# Patient Record
Sex: Male | Born: 1950
Health system: Southern US, Community
[De-identification: ages and names within clinical notes are randomized; demographics above are authoritative.]

## PROBLEM LIST (undated history)

## (undated) DIAGNOSIS — E785 Hyperlipidemia, unspecified: Secondary | ICD-10-CM

## (undated) DIAGNOSIS — C801 Malignant (primary) neoplasm, unspecified: Secondary | ICD-10-CM

## (undated) DIAGNOSIS — M542 Cervicalgia: Secondary | ICD-10-CM

## (undated) DIAGNOSIS — Z8679 Personal history of other diseases of the circulatory system: Secondary | ICD-10-CM

## (undated) DIAGNOSIS — F32A Depression, unspecified: Secondary | ICD-10-CM

## (undated) DIAGNOSIS — N529 Male erectile dysfunction, unspecified: Secondary | ICD-10-CM

## (undated) DIAGNOSIS — R7303 Prediabetes: Secondary | ICD-10-CM

## (undated) DIAGNOSIS — G8929 Other chronic pain: Secondary | ICD-10-CM

## (undated) DIAGNOSIS — M5136 Other intervertebral disc degeneration, lumbar region: Secondary | ICD-10-CM

## (undated) DIAGNOSIS — R519 Headache, unspecified: Secondary | ICD-10-CM

## (undated) DIAGNOSIS — I10 Essential (primary) hypertension: Secondary | ICD-10-CM

## (undated) DIAGNOSIS — E291 Testicular hypofunction: Secondary | ICD-10-CM

## (undated) DIAGNOSIS — G4733 Obstructive sleep apnea (adult) (pediatric): Secondary | ICD-10-CM

## (undated) DIAGNOSIS — T8859XA Other complications of anesthesia, initial encounter: Secondary | ICD-10-CM

## (undated) DIAGNOSIS — M51369 Other intervertebral disc degeneration, lumbar region without mention of lumbar back pain or lower extremity pain: Secondary | ICD-10-CM

## (undated) DIAGNOSIS — T7840XA Allergy, unspecified, initial encounter: Secondary | ICD-10-CM

## (undated) DIAGNOSIS — R011 Cardiac murmur, unspecified: Secondary | ICD-10-CM

## (undated) DIAGNOSIS — R51 Headache: Secondary | ICD-10-CM

## (undated) DIAGNOSIS — E6609 Other obesity due to excess calories: Secondary | ICD-10-CM

## (undated) DIAGNOSIS — K589 Irritable bowel syndrome without diarrhea: Secondary | ICD-10-CM

## (undated) DIAGNOSIS — I429 Cardiomyopathy, unspecified: Secondary | ICD-10-CM

## (undated) DIAGNOSIS — M79671 Pain in right foot: Secondary | ICD-10-CM

## (undated) DIAGNOSIS — G4730 Sleep apnea, unspecified: Secondary | ICD-10-CM

## (undated) DIAGNOSIS — Z8781 Personal history of (healed) traumatic fracture: Secondary | ICD-10-CM

## (undated) DIAGNOSIS — K219 Gastro-esophageal reflux disease without esophagitis: Secondary | ICD-10-CM

## (undated) DIAGNOSIS — F329 Major depressive disorder, single episode, unspecified: Secondary | ICD-10-CM

## (undated) HISTORY — PX: CARPAL TUNNEL RELEASE: SHX101

## (undated) HISTORY — DX: Personal history of other diseases of the circulatory system: Z86.79

## (undated) HISTORY — DX: Irritable bowel syndrome, unspecified: K58.9

## (undated) HISTORY — DX: Testicular hypofunction: E29.1

## (undated) HISTORY — DX: Depression, unspecified: F32.A

## (undated) HISTORY — DX: Hyperlipidemia, unspecified: E78.5

## (undated) HISTORY — PX: HAMMER TOE SURGERY: SHX385

## (undated) HISTORY — DX: Malignant (primary) neoplasm, unspecified: C80.1

## (undated) HISTORY — DX: Other chronic pain: G89.29

## (undated) HISTORY — PX: SHOULDER ARTHROSCOPY: SHX128

## (undated) HISTORY — PX: TENDON REPAIR: SHX5111

## (undated) HISTORY — PX: WISDOM TOOTH EXTRACTION: SHX21

## (undated) HISTORY — DX: Male erectile dysfunction, unspecified: N52.9

## (undated) HISTORY — DX: Cardiomyopathy, unspecified: I42.9

## (undated) HISTORY — DX: Allergy, unspecified, initial encounter: T78.40XA

## (undated) HISTORY — DX: Other intervertebral disc degeneration, lumbar region without mention of lumbar back pain or lower extremity pain: M51.369

## (undated) HISTORY — DX: Major depressive disorder, single episode, unspecified: F32.9

## (undated) HISTORY — PX: COLONOSCOPY: SHX174

## (undated) HISTORY — DX: Other obesity due to excess calories: E66.09

## (undated) HISTORY — PX: OTHER SURGICAL HISTORY: SHX169

## (undated) HISTORY — PX: PLANTAR FASCIA RELEASE: SHX2239

## (undated) HISTORY — DX: Headache, unspecified: R51.9

## (undated) HISTORY — DX: Obstructive sleep apnea (adult) (pediatric): G47.33

## (undated) HISTORY — DX: Gastro-esophageal reflux disease without esophagitis: K21.9

## (undated) HISTORY — DX: Cervicalgia: M54.2

## (undated) HISTORY — DX: Personal history of (healed) traumatic fracture: Z87.81

## (undated) HISTORY — PX: ORIF WRIST FRACTURE: SHX2133

## (undated) HISTORY — DX: Other intervertebral disc degeneration, lumbar region: M51.36

## (undated) HISTORY — DX: Headache: R51

## (undated) HISTORY — PX: POLYPECTOMY: SHX149

## (undated) HISTORY — DX: Pain in right foot: M79.671

## (undated) HISTORY — PX: MENISCUS REPAIR: SHX5179

## (undated) HISTORY — DX: Sleep apnea, unspecified: G47.30

---

## 1986-08-19 DIAGNOSIS — Z8781 Personal history of (healed) traumatic fracture: Secondary | ICD-10-CM

## 1986-08-19 HISTORY — DX: Personal history of (healed) traumatic fracture: Z87.81

## 1994-08-19 DIAGNOSIS — Z8679 Personal history of other diseases of the circulatory system: Secondary | ICD-10-CM

## 1994-08-19 HISTORY — DX: Personal history of other diseases of the circulatory system: Z86.79

## 1998-08-19 HISTORY — PX: CARDIAC CATHETERIZATION: SHX172

## 2015-06-21 HISTORY — PX: TRANSTHORACIC ECHOCARDIOGRAM: SHX275

## 2015-06-30 ENCOUNTER — Emergency Department (HOSPITAL_COMMUNITY)
Admission: EM | Admit: 2015-06-30 | Discharge: 2015-06-30 | Disposition: A | Discharge status: Home / Self Care | Attending: Emergency Medicine | Admitting: Emergency Medicine

## 2015-06-30 ENCOUNTER — Encounter (HOSPITAL_COMMUNITY): Payer: Self-pay | Admitting: Family Medicine

## 2015-06-30 ENCOUNTER — Emergency Department (HOSPITAL_COMMUNITY)

## 2015-06-30 DIAGNOSIS — Y9389 Activity, other specified: Secondary | ICD-10-CM | POA: Diagnosis not present

## 2015-06-30 DIAGNOSIS — W109XXA Fall (on) (from) unspecified stairs and steps, initial encounter: Secondary | ICD-10-CM | POA: Diagnosis not present

## 2015-06-30 DIAGNOSIS — Z79899 Other long term (current) drug therapy: Secondary | ICD-10-CM | POA: Diagnosis not present

## 2015-06-30 DIAGNOSIS — S8991XA Unspecified injury of right lower leg, initial encounter: Secondary | ICD-10-CM | POA: Diagnosis present

## 2015-06-30 DIAGNOSIS — M79606 Pain in leg, unspecified: Secondary | ICD-10-CM

## 2015-06-30 DIAGNOSIS — Y998 Other external cause status: Secondary | ICD-10-CM | POA: Insufficient documentation

## 2015-06-30 DIAGNOSIS — Z7982 Long term (current) use of aspirin: Secondary | ICD-10-CM | POA: Diagnosis not present

## 2015-06-30 DIAGNOSIS — I10 Essential (primary) hypertension: Secondary | ICD-10-CM | POA: Insufficient documentation

## 2015-06-30 DIAGNOSIS — Y92009 Unspecified place in unspecified non-institutional (private) residence as the place of occurrence of the external cause: Secondary | ICD-10-CM | POA: Diagnosis not present

## 2015-06-30 DIAGNOSIS — S76111A Strain of right quadriceps muscle, fascia and tendon, initial encounter: Secondary | ICD-10-CM

## 2015-06-30 DIAGNOSIS — M79604 Pain in right leg: Secondary | ICD-10-CM

## 2015-06-30 DIAGNOSIS — S79911A Unspecified injury of right hip, initial encounter: Secondary | ICD-10-CM | POA: Insufficient documentation

## 2015-06-30 HISTORY — DX: Essential (primary) hypertension: I10

## 2015-06-30 IMAGING — CR DG HIP (WITH OR WITHOUT PELVIS) 2-3V*R*
3 series · 3 of 3 positions shown · non-contrast
Comparison: None.

CLINICAL DATA: Fell today while to mild showing a house. Pain in
the right hip.

EXAM:
DG HIP (WITH OR WITHOUT PELVIS) 2-3V RIGHT

[pelvis ap]
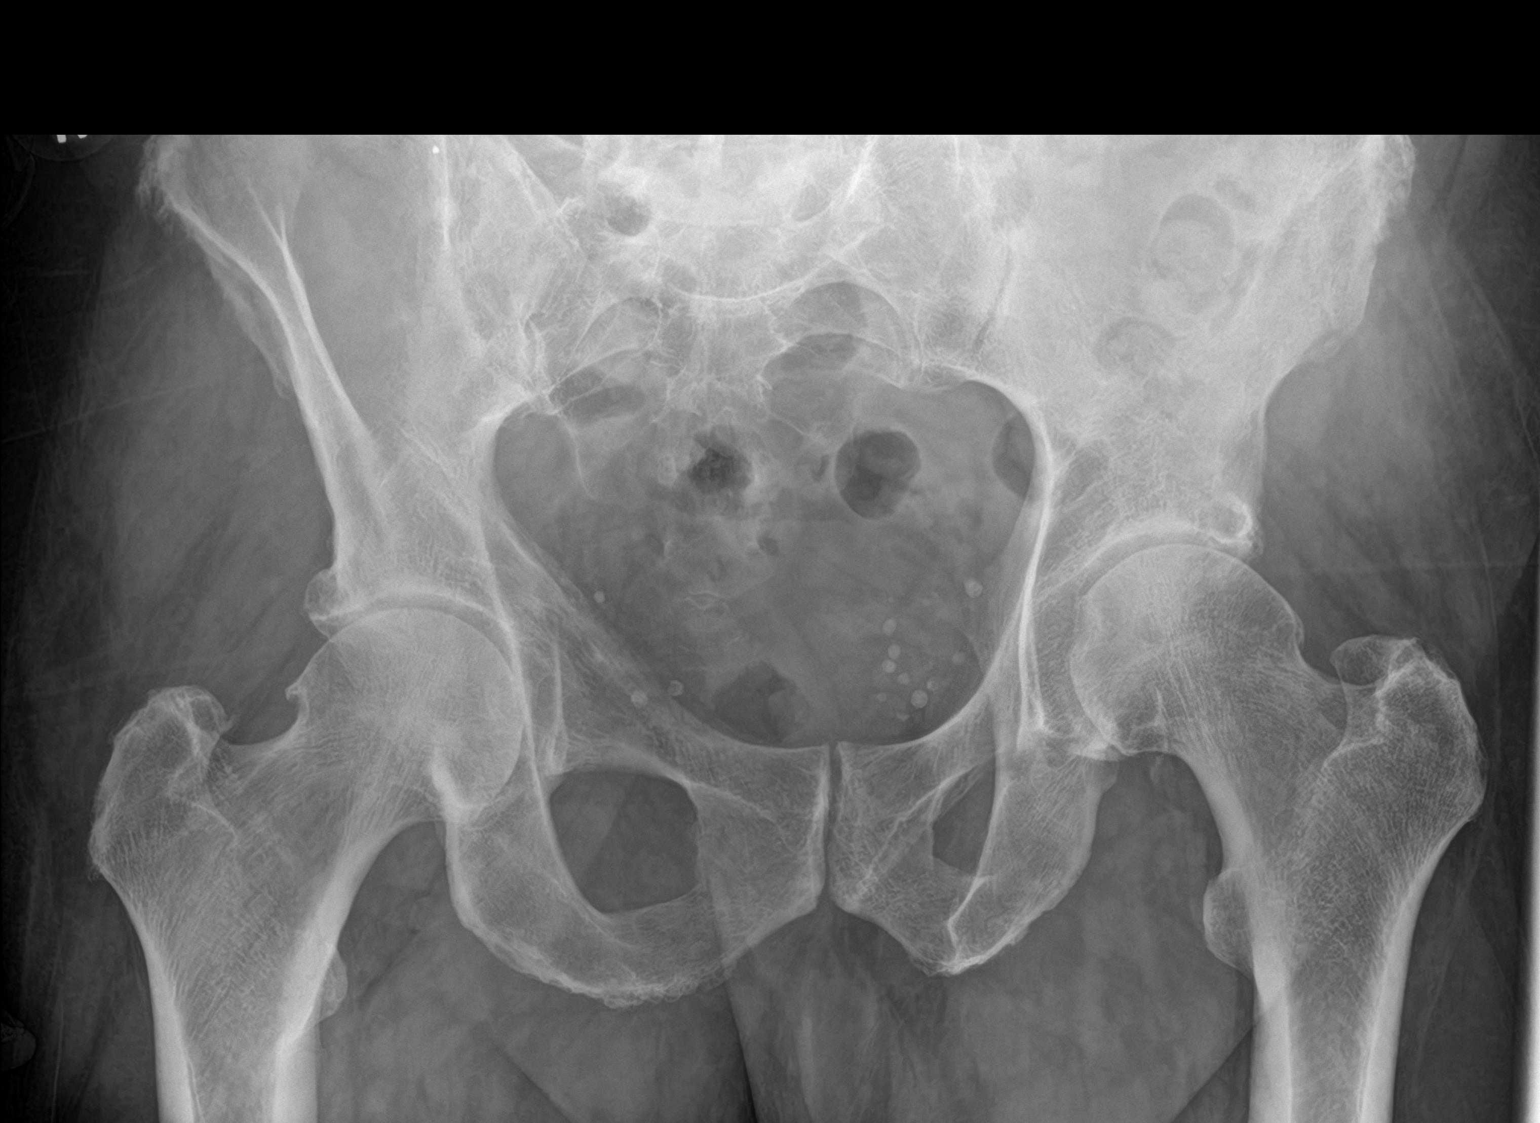

[hip ap]
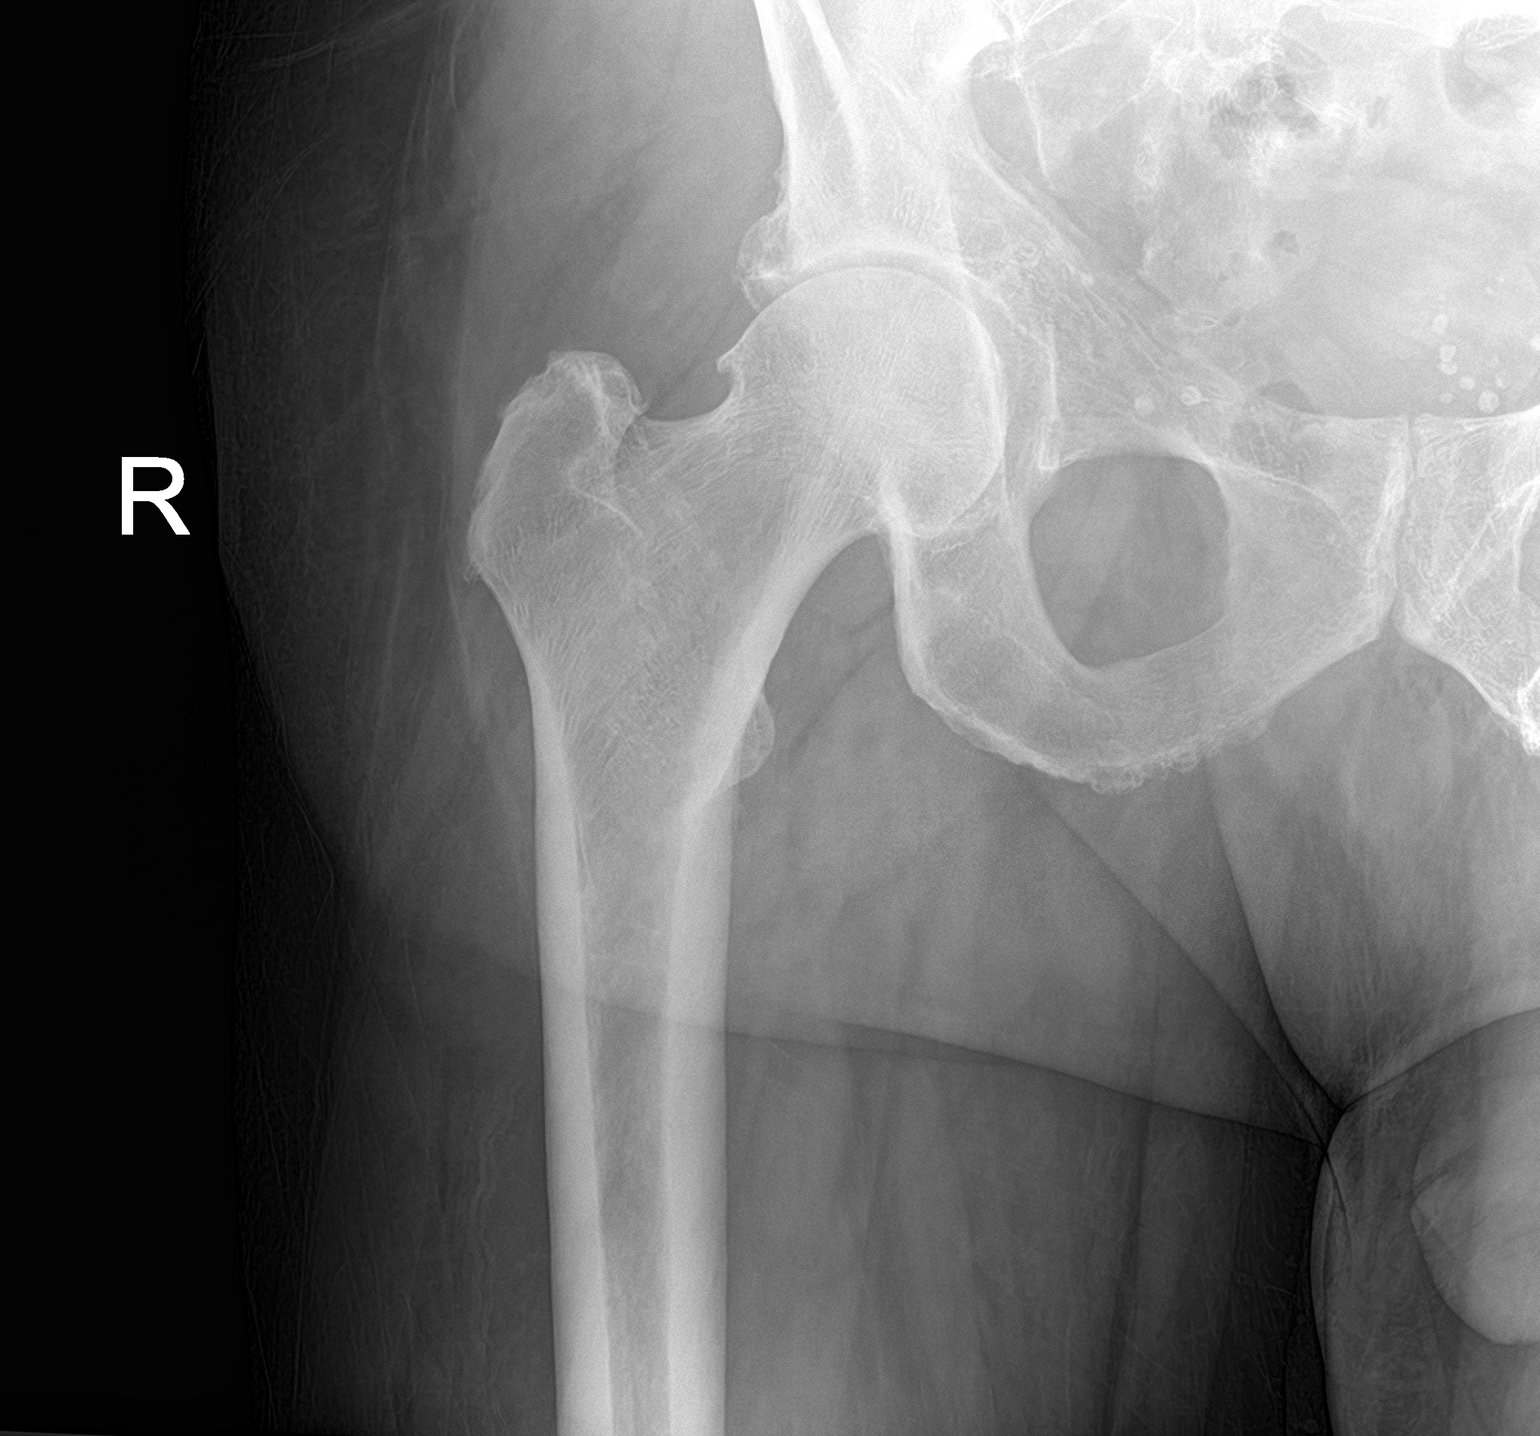

[hip lat]
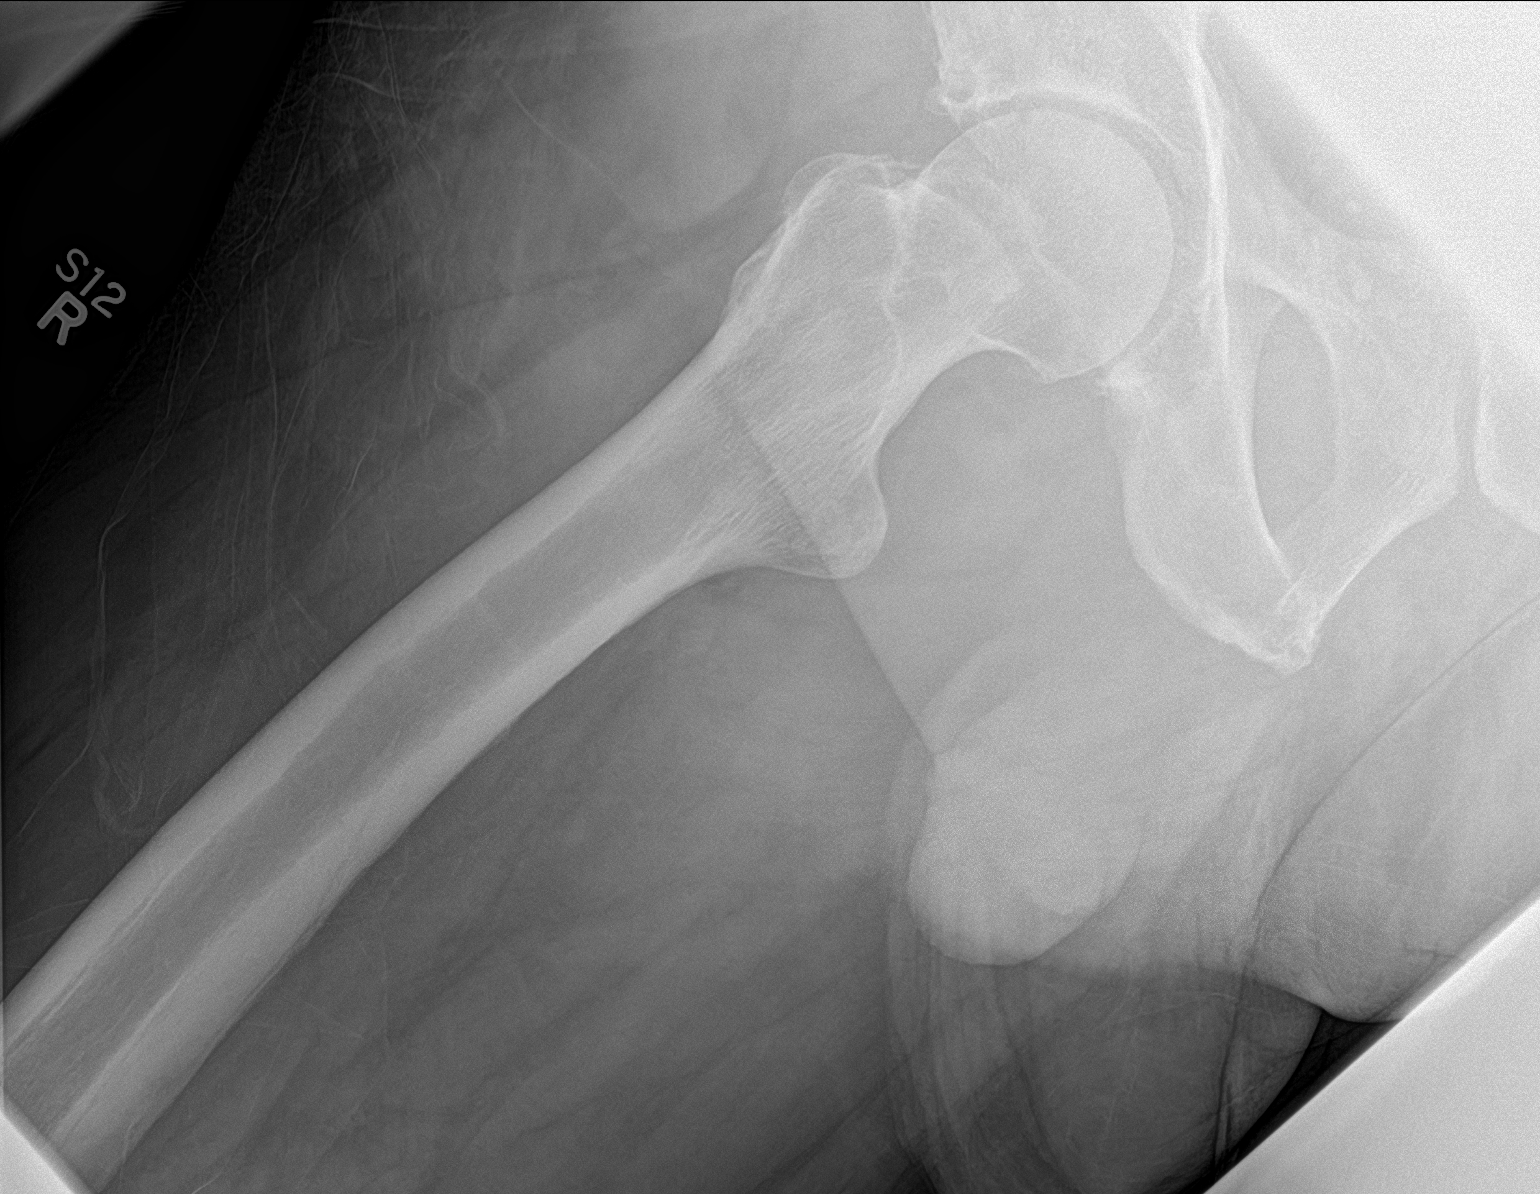

[3 of 3 positions shown; findings below may reference images not displayed]

FINDINGS: No evidence of fracture or dislocation. Mild to moderate
osteoarthritis of both hip joints.
IMPRESSION: No acute or traumatic finding.  Osteoarthritis of the hip joints.

## 2015-06-30 IMAGING — CR DG ANKLE COMPLETE 3+V*R*
3 series · 3 of 3 positions shown · non-contrast
Comparison: None.

CLINICAL DATA: Fell today and injured right knee, right femur and
right ankle

EXAM:
RIGHT KNEE - COMPLETE 4+ VIEW; RIGHT FEMUR 2 VIEWS; RIGHT ANKLE -
COMPLETE 3+ VIEW

[ankle ap]
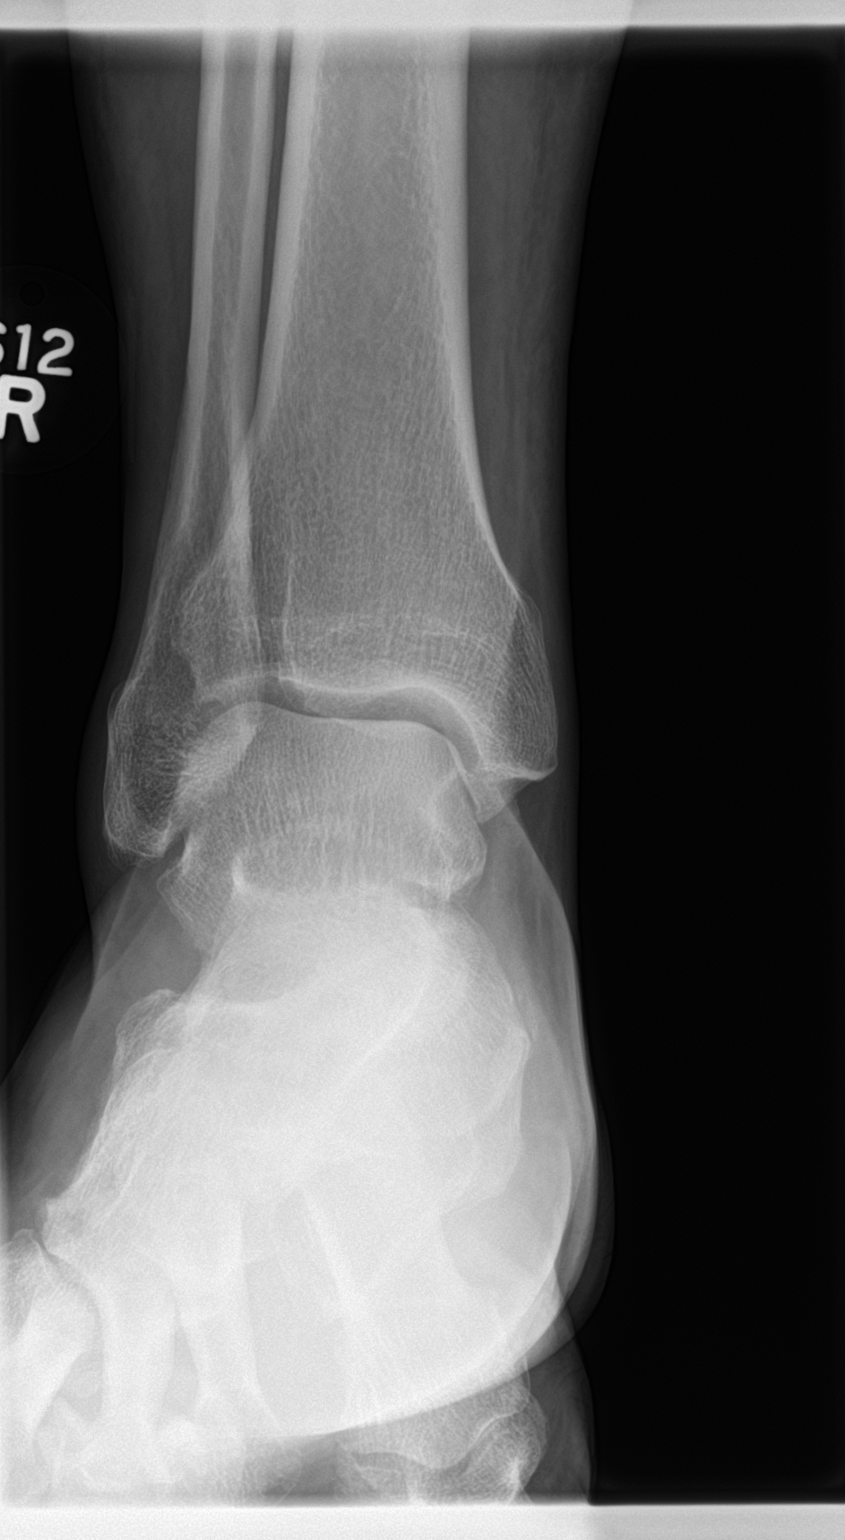

[ankle obl]
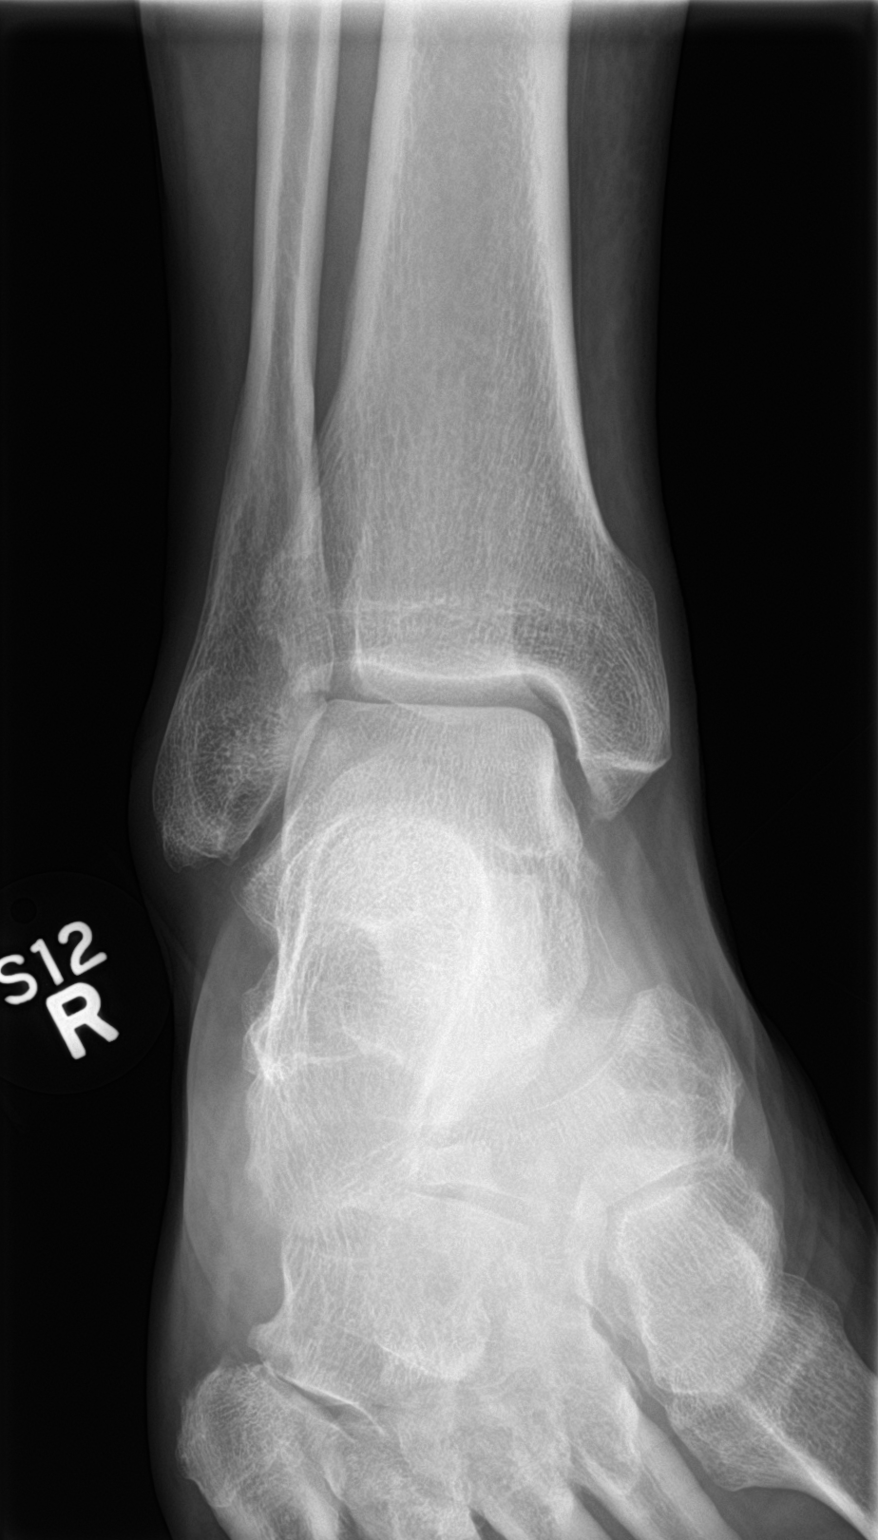

[ankle lat]
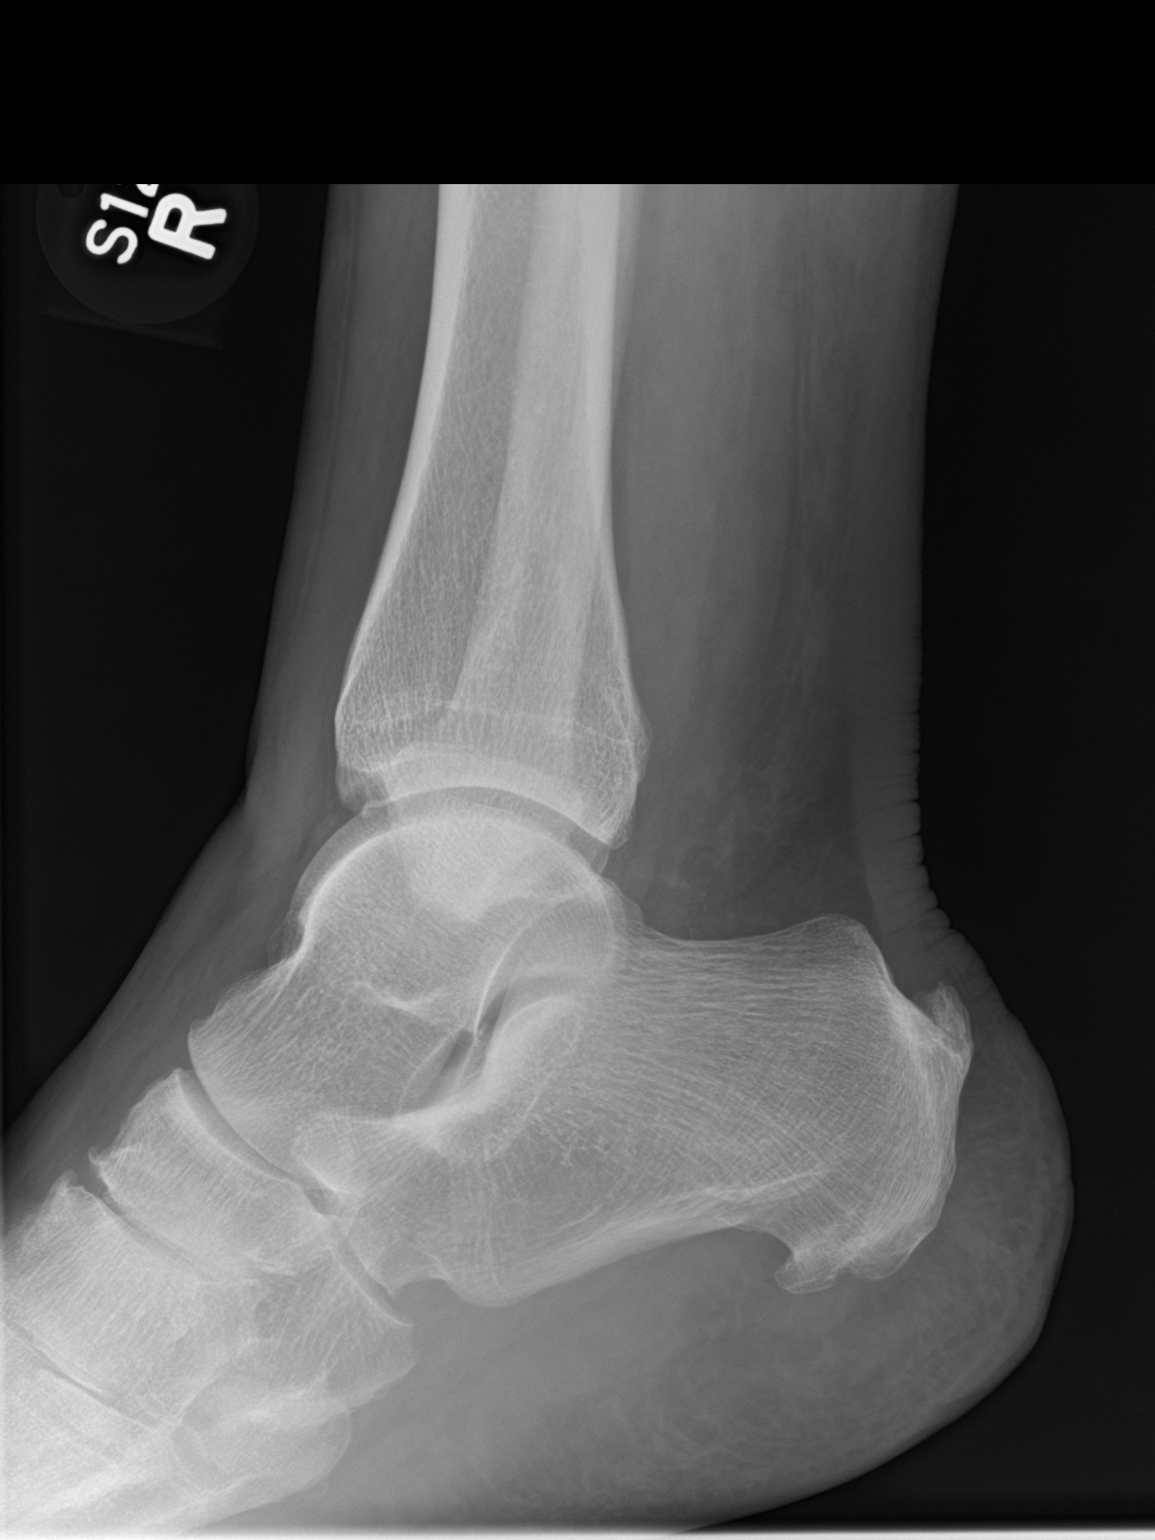

[3 of 3 positions shown; findings below may reference images not displayed]

FINDINGS: Right femur:

Mild hip joint degenerative changes but no acute fracture. The
visualized bony pelvis is intact. No femur fracture.

Right knee:

Moderate tricompartmental degenerative changes with joint space
narrowing and osteophytic spurring. No acute fracture or
osteochondral abnormality. Somewhat heterogeneous appearance of the
quadriceps tendon. Could not exclude a tendon injury.

Right ankle:

The ankle mortise is maintained. No acute ankle fracture. Mild
degenerative changes. No definite joint effusion. The mid and
hindfoot bony structures are intact. Calcaneal spurs are noted.
IMPRESSION: No acute hip or femur fracture.

Degenerative changes involving the knee but no acute fracture. Could
not exclude the possibility of a quadriceps tendon injury.

No acute ankle fracture.

## 2015-06-30 IMAGING — CR DG FEMUR 2+V*R*
4 series · 4 of 4 positions shown · non-contrast
Comparison: None.

CLINICAL DATA: Fell today and injured right knee, right femur and
right ankle

EXAM:
RIGHT KNEE - COMPLETE 4+ VIEW; RIGHT FEMUR 2 VIEWS; RIGHT ANKLE -
COMPLETE 3+ VIEW

[femur ap (1 of 2)]
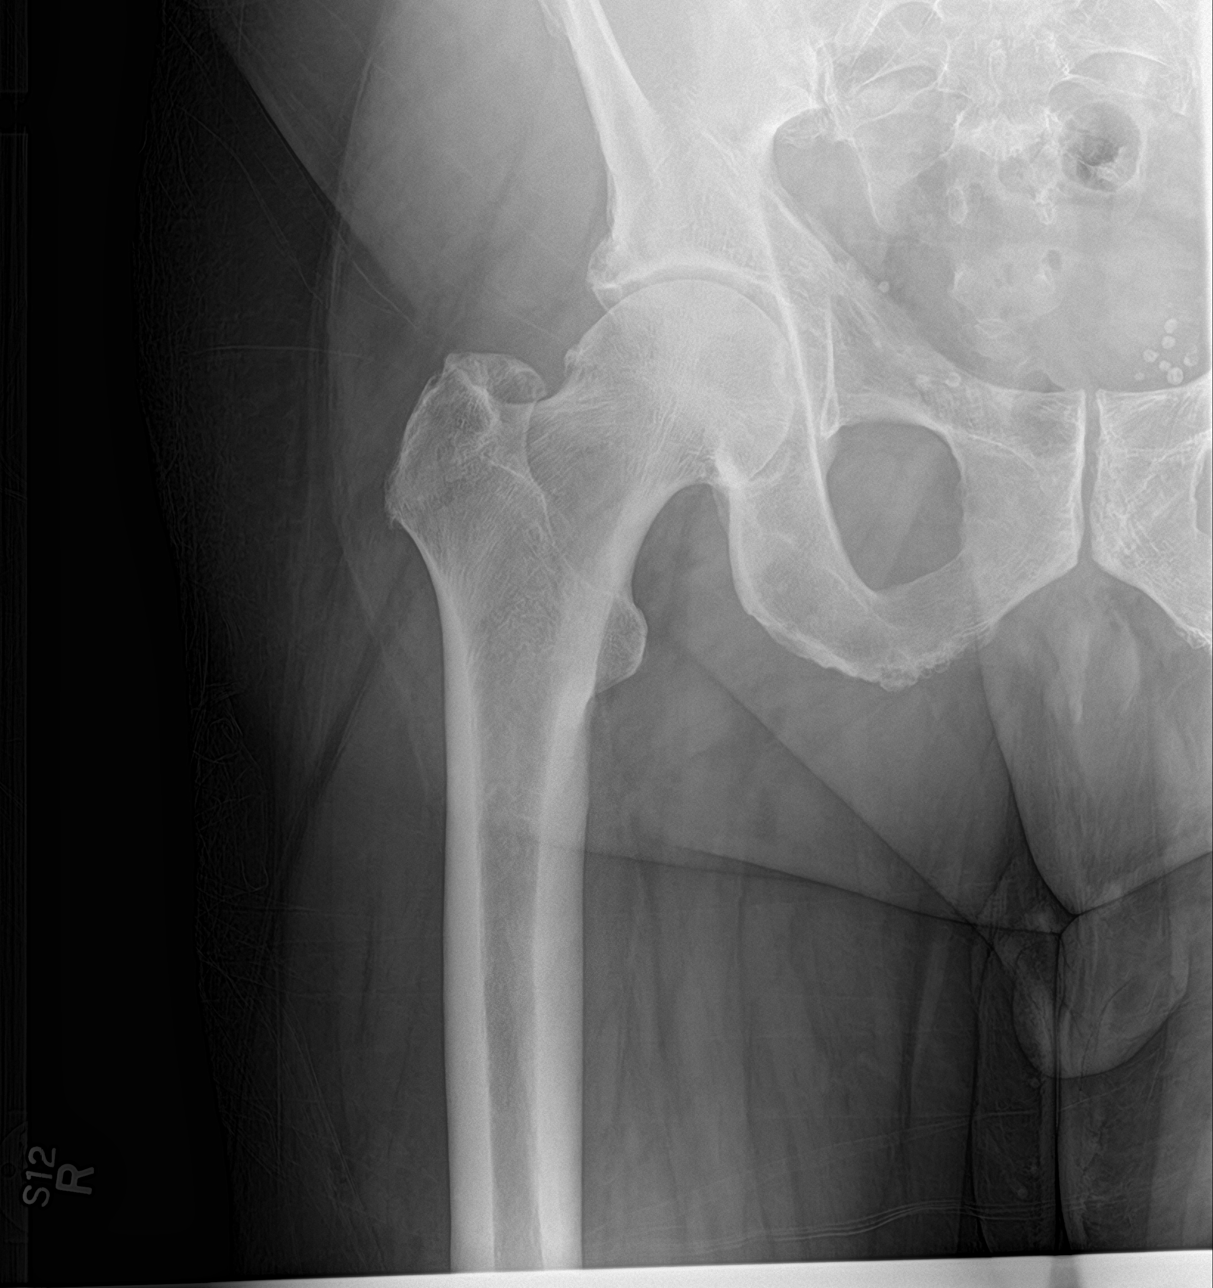

[femur ap (2 of 2)]
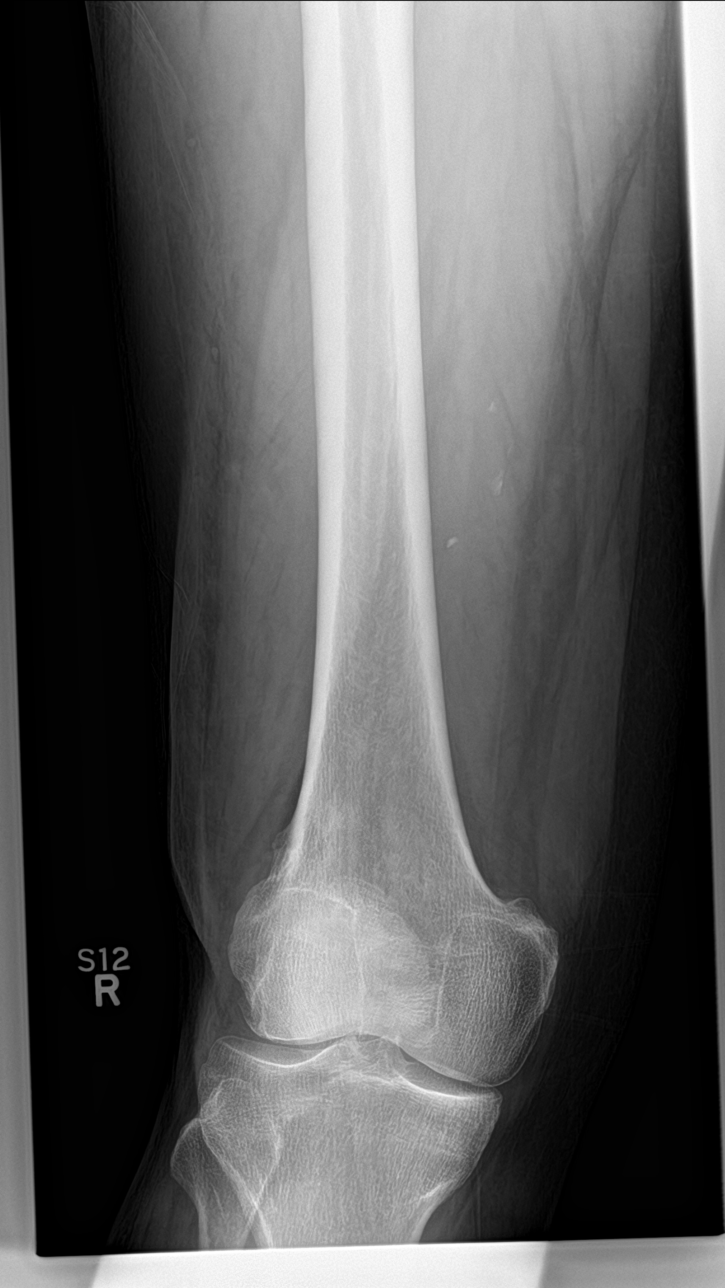

[femur lat (1 of 2)]
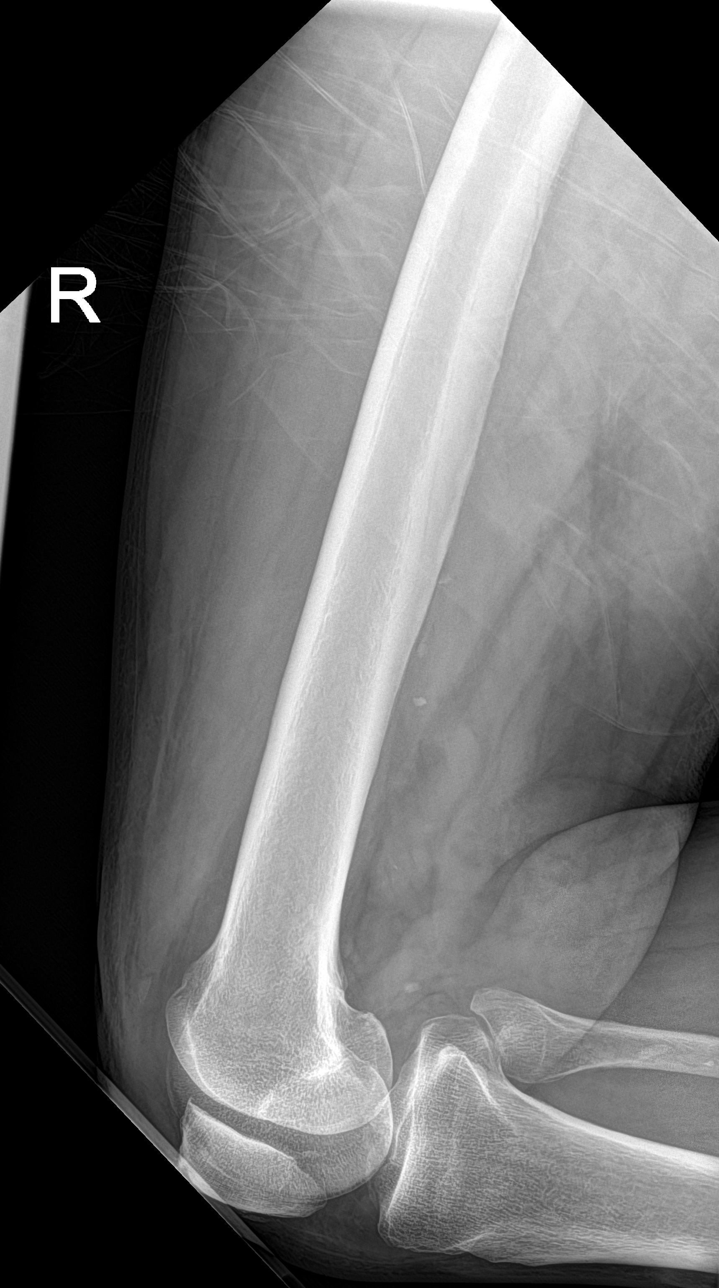

[femur lat (2 of 2)]
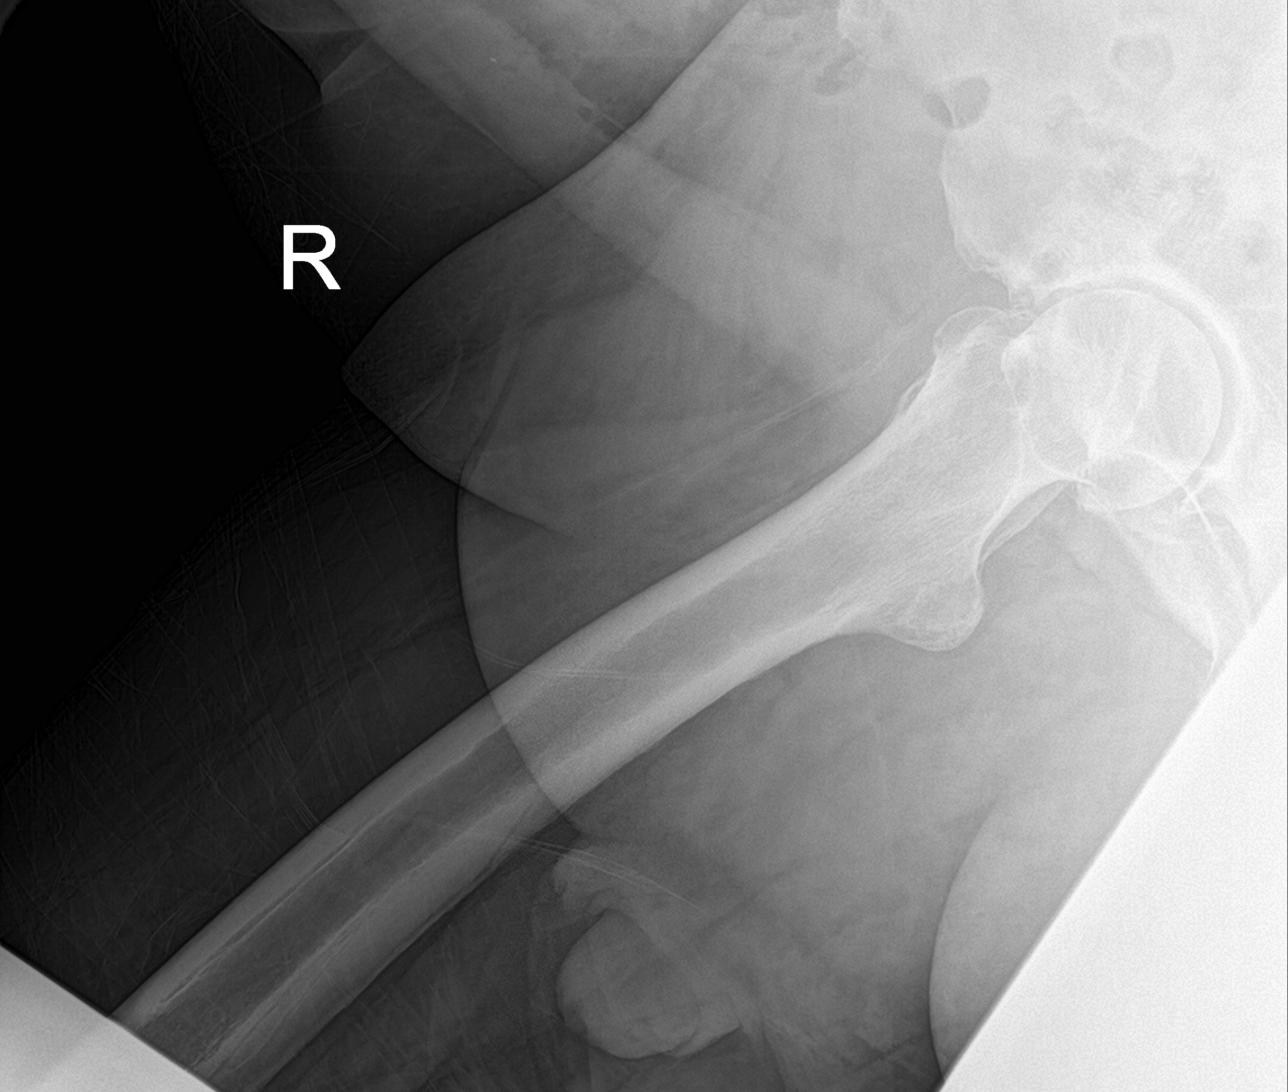

[4 of 4 positions shown; findings below may reference images not displayed]

FINDINGS: Right femur:

Mild hip joint degenerative changes but no acute fracture. The
visualized bony pelvis is intact. No femur fracture.

Right knee:

Moderate tricompartmental degenerative changes with joint space
narrowing and osteophytic spurring. No acute fracture or
osteochondral abnormality. Somewhat heterogeneous appearance of the
quadriceps tendon. Could not exclude a tendon injury.

Right ankle:

The ankle mortise is maintained. No acute ankle fracture. Mild
degenerative changes. No definite joint effusion. The mid and
hindfoot bony structures are intact. Calcaneal spurs are noted.
IMPRESSION: No acute hip or femur fracture.

Degenerative changes involving the knee but no acute fracture. Could
not exclude the possibility of a quadriceps tendon injury.

No acute ankle fracture.

## 2015-06-30 IMAGING — CR DG KNEE COMPLETE 4+V*R*
4 series · 4 of 4 positions shown · non-contrast
Comparison: None.

CLINICAL DATA: Fell today and injured right knee, right femur and
right ankle

EXAM:
RIGHT KNEE - COMPLETE 4+ VIEW; RIGHT FEMUR 2 VIEWS; RIGHT ANKLE -
COMPLETE 3+ VIEW

[tunnel]
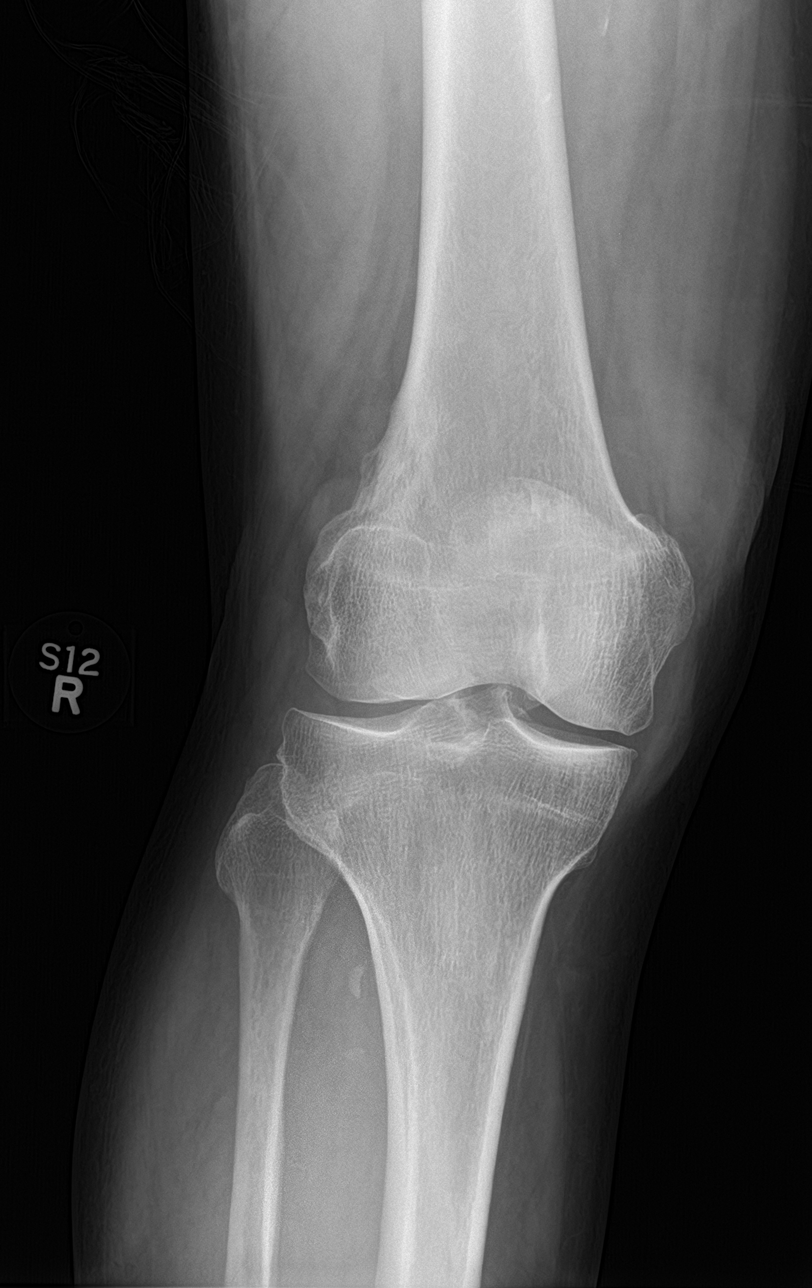

[knee lat]
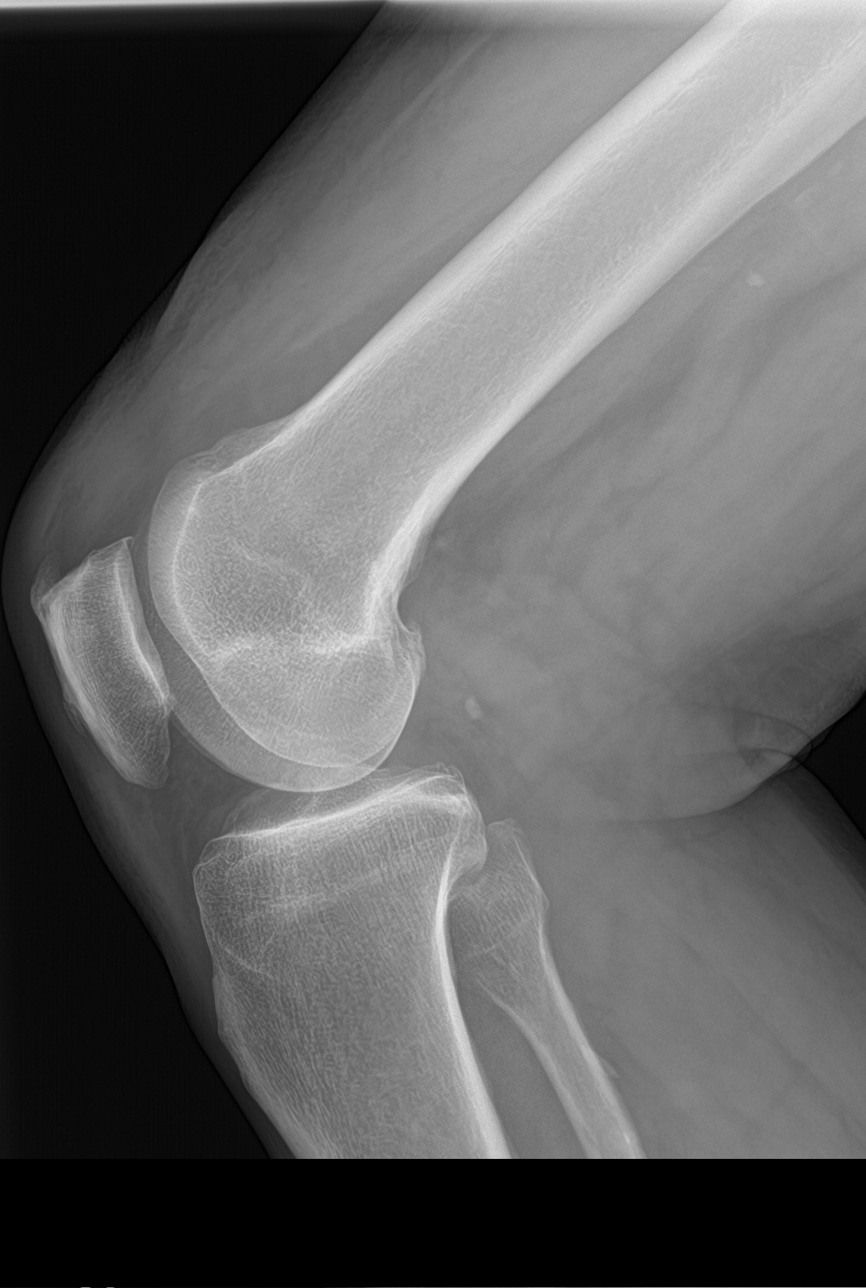

[knee obl (1 of 2)]
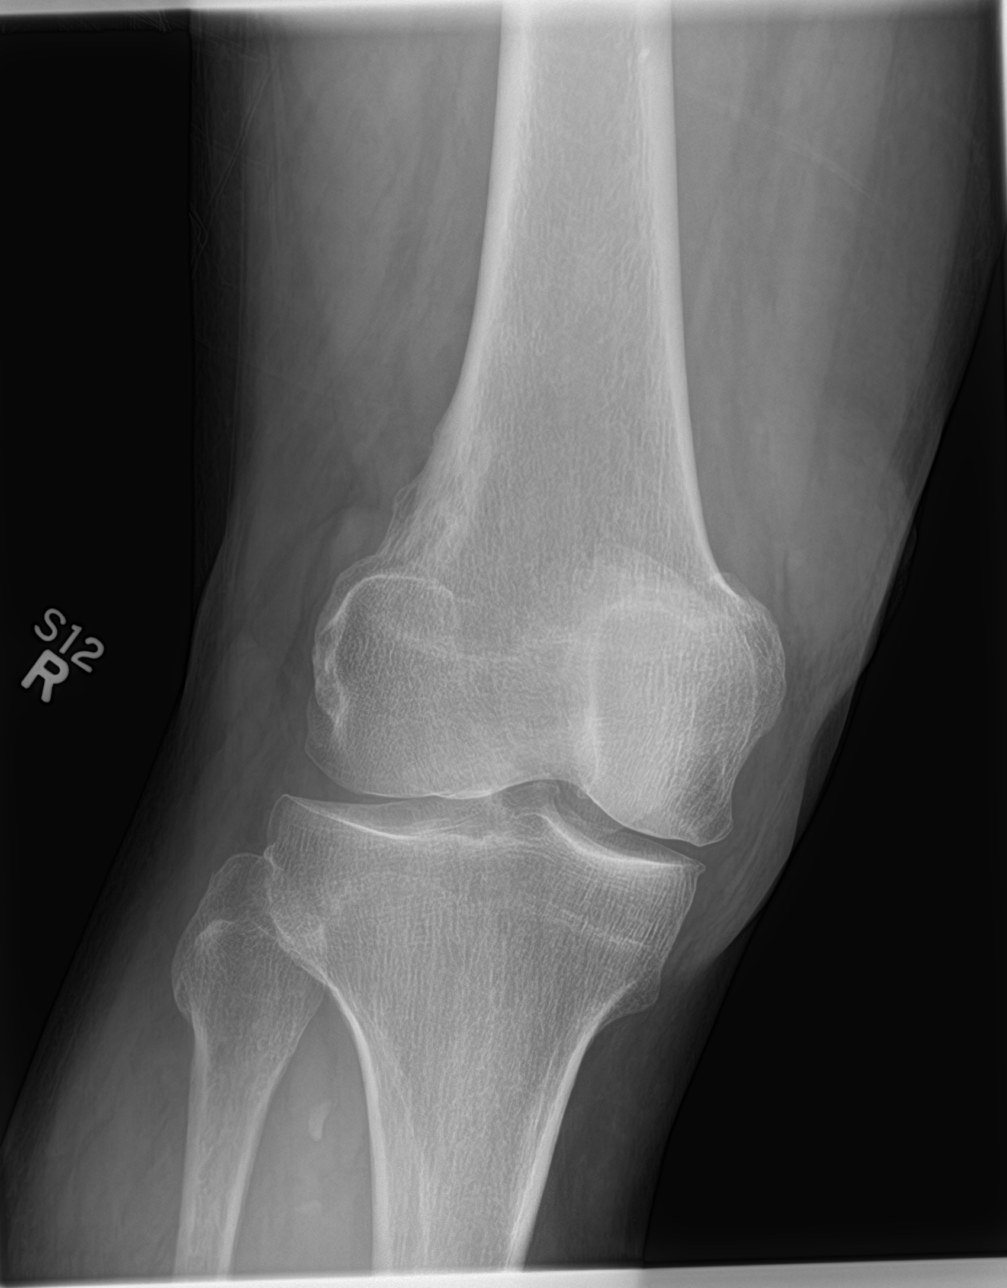

[knee obl (2 of 2)]
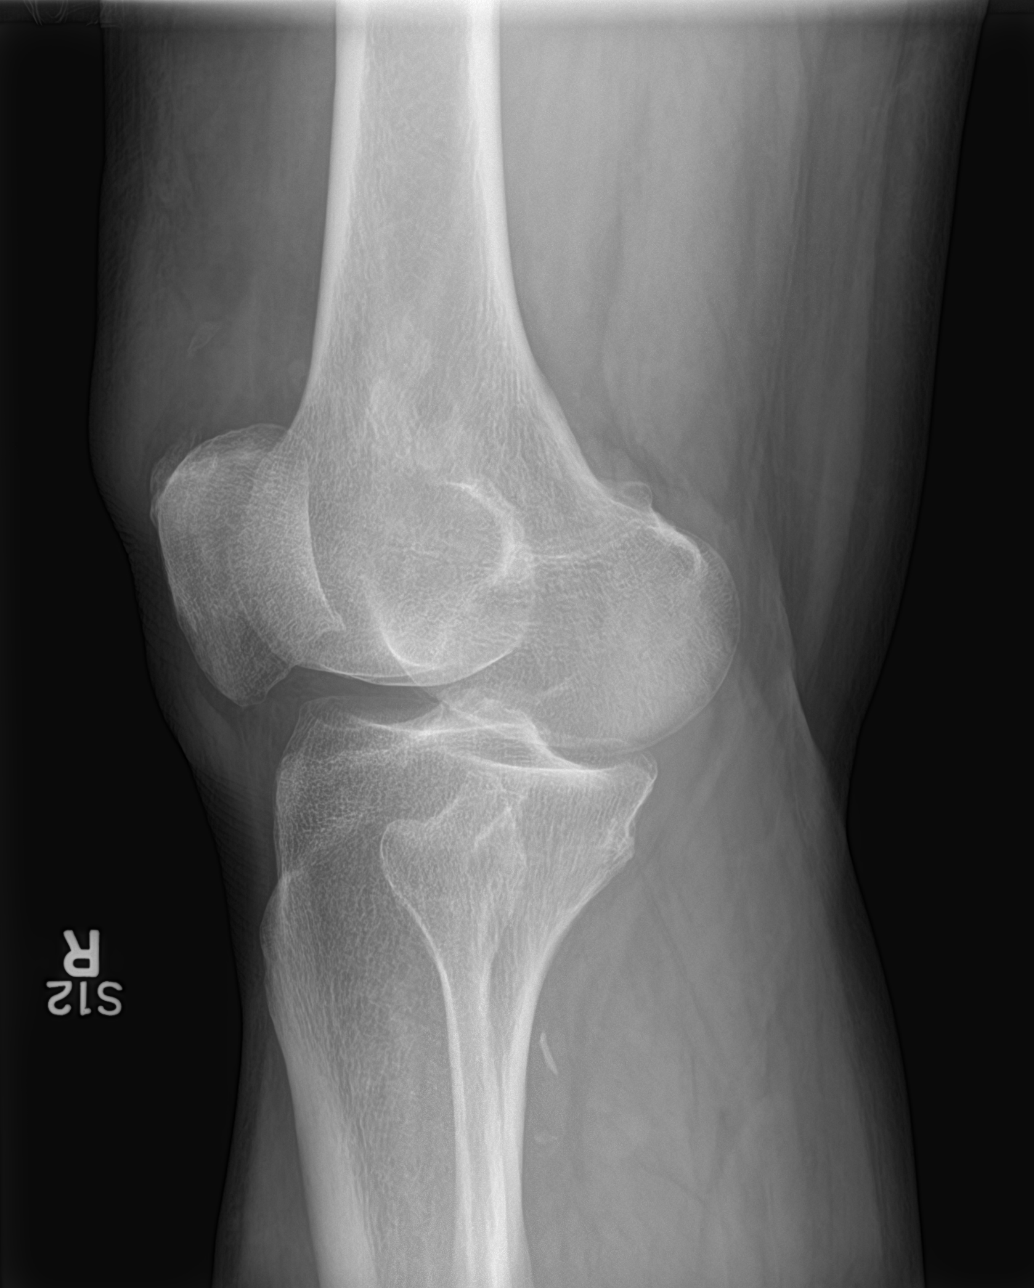

[4 of 4 positions shown; findings below may reference images not displayed]

FINDINGS: Right femur:

Mild hip joint degenerative changes but no acute fracture. The
visualized bony pelvis is intact. No femur fracture.

Right knee:

Moderate tricompartmental degenerative changes with joint space
narrowing and osteophytic spurring. No acute fracture or
osteochondral abnormality. Somewhat heterogeneous appearance of the
quadriceps tendon. Could not exclude a tendon injury.

Right ankle:

The ankle mortise is maintained. No acute ankle fracture. Mild
degenerative changes. No definite joint effusion. The mid and
hindfoot bony structures are intact. Calcaneal spurs are noted.
IMPRESSION: No acute hip or femur fracture.

Degenerative changes involving the knee but no acute fracture. Could
not exclude the possibility of a quadriceps tendon injury.

No acute ankle fracture.

## 2015-06-30 MED ORDER — HYDROCODONE-ACETAMINOPHEN 5-325 MG PO TABS
1.0000 | ORAL_TABLET | Freq: Four times a day (QID) | ORAL | Status: DC | PRN
Start: 2015-06-30 — End: 2017-06-05

## 2015-06-30 MED ORDER — HYDROCODONE-ACETAMINOPHEN 5-325 MG PO TABS
1.0000 | ORAL_TABLET | Freq: Once | ORAL | Status: AC
Start: 2015-06-30 — End: 2015-06-30
  Administered 2015-06-30: 2 via ORAL
  Filled 2015-06-30: qty 2

## 2015-06-30 NOTE — ED Provider Notes (Signed)
CSN: [id number]     Arrival date & time 06/30/15  1606 History   First MD Initiated Contact with Patient 06/30/15 1617     Chief Complaint  Patient presents with  . Leg Injury     (Consider location/radiation/quality/duration/timing/severity/associated sxs/prior Treatment) HPI 64 year old male who presents with fall on and right lower extremity pain. History of hypertension and takes a full dose of aspirin daily. Is demolition in the house, and caring crown molding out of the home when he lost his footing on a step and fell down onto his knees.. He had severe pain to the right hip and knee, I was unable to ambulate. Chronically has numbness in his right lower extremity from prior injury.  Denies hitting his head or having loss of consciousness. Denies any back pain or neck pain.   Past Medical History  Diagnosis Date  . Hypertension    History reviewed. No pertinent past surgical history. History reviewed. No pertinent family history. Social History  Substance Use Topics  . Smoking status: Never Smoker   . Smokeless tobacco: None  . Alcohol Use: No    Review of Systems 10/14 systems reviewed and are negative other than those stated in the HPI    Allergies  Review of patient's allergies indicates no known allergies.  Home Medications   Prior to Admission medications   Medication Sig Start Date End Date Taking? Authorizing Provider  Armodafinil (NUVIGIL) 200 MG TABS Take 1 tablet by mouth daily.   Yes Historical Provider, MD  aspirin 325 MG tablet Take 325 mg by mouth daily.    Yes Historical Provider, MD  carvedilol (COREG) 6.25 MG tablet Take 6.25 mg by mouth 2 (two) times daily with a meal.   Yes Historical Provider, MD  HYDROcodone-acetaminophen (NORCO/VICODIN) 5-325 MG tablet Take 1 tablet by mouth every 6 (six) hours as needed for moderate pain.   Yes Historical Provider, MD  ibuprofen (ADVIL,MOTRIN) 600 MG tablet Take 600 mg by mouth every 6 (six) hours as needed for  moderate pain.   Yes Historical Provider, MD  lisinopril (PRINIVIL,ZESTRIL) 10 MG tablet Take 10 mg by mouth daily.   Yes Historical Provider, MD  magnesium 30 MG tablet Take 30 mg by mouth daily.   Yes Historical Provider, MD  Multiple Vitamin (MULTIVITAMIN WITH MINERALS) TABS tablet Take 1 tablet by mouth daily.   Yes Historical Provider, MD  simvastatin (ZOCOR) 20 MG tablet Take 20 mg by mouth daily.   Yes Historical Provider, MD  Testosterone (FORTESTA) 10 MG/ACT (2%) GEL Place 40 mg onto the skin daily.    Yes Historical Provider, MD  Vilazodone HCl (VIIBRYD) 40 MG TABS Take 40 mg by mouth daily.   Yes Historical Provider, MD  HYDROcodone-acetaminophen (NORCO/VICODIN) 5-325 MG tablet Take 1-2 tablets by mouth every 6 (six) hours as needed for moderate pain or severe pain. 06/30/15   Forde Dandy, MD   BP 125/85 mmHg  Pulse 64  Temp(Src) 98.6 F (37 C)  Resp 16  SpO2 96% Physical Exam Physical Exam  Nursing note and vitals reviewed. Constitutional: Well developed, well nourished, non-toxic, and in no acute distress Head: Normocephalic and atraumatic.  Mouth/Throat: Oropharynx is clear and moist.  Neck: Normal range of motion. Neck supple. No cervical spine tenderness. Cardiovascular: Normal rate and regular rhythm.   Pulmonary/Chest: Effort normal and breath sounds normal. No chest wall tenderness Abdominal: Soft. There is no tenderness. There is no rebound and no guarding.  Musculoskeletal:  tender to palpation  superior to the right knee, with significant pain with knee extension, limited extension of knee as well due to pain.  Mild tenderness to the right hip to palpation and right ankle as well, but with preserved range of motion.  Neurological: Alert, no facial droop, fluent speech, full strength in ankle and large toe dorsi/plantar flexion bilaterally Skin: Skin is warm and dry.  Psychiatric: Cooperative  ED Course  Procedures (including critical care time) Labs Review Labs  Reviewed - No data to display  Imaging Review Dg Ankle Complete Right  06/30/2015  CLINICAL DATA:  [place] today and injured right knee, right femur and right ankle EXAM: RIGHT KNEE - COMPLETE 4+ VIEW; RIGHT FEMUR 2 VIEWS; RIGHT ANKLE - COMPLETE 3+ VIEW COMPARISON:  None. FINDINGS: Right femur: Mild hip joint degenerative changes but no acute fracture. The visualized bony pelvis is intact. No femur fracture. Right knee: Moderate tricompartmental degenerative changes with joint space narrowing and osteophytic spurring. No acute fracture or osteochondral abnormality. Somewhat heterogeneous appearance of the quadriceps tendon. Could not exclude a tendon injury. Right ankle: The ankle mortise is maintained. No acute ankle fracture. Mild degenerative changes. No definite joint effusion. The mid and hindfoot bony structures are intact. Calcaneal spurs are noted. IMPRESSION: No acute hip or femur fracture. Degenerative changes involving the knee but no acute fracture. Could not exclude the possibility of a quadriceps tendon injury. No acute ankle fracture. Electronically Signed   By: Marijo Sanes M.D.   On: 06/30/2015 17:43   Dg Knee Complete 4 Views Right  06/30/2015  CLINICAL DATA:  [place] today and injured right knee, right femur and right ankle EXAM: RIGHT KNEE - COMPLETE 4+ VIEW; RIGHT FEMUR 2 VIEWS; RIGHT ANKLE - COMPLETE 3+ VIEW COMPARISON:  None. FINDINGS: Right femur: Mild hip joint degenerative changes but no acute fracture. The visualized bony pelvis is intact. No femur fracture. Right knee: Moderate tricompartmental degenerative changes with joint space narrowing and osteophytic spurring. No acute fracture or osteochondral abnormality. Somewhat heterogeneous appearance of the quadriceps tendon. Could not exclude a tendon injury. Right ankle: The ankle mortise is maintained. No acute ankle fracture. Mild degenerative changes. No definite joint effusion. The mid and hindfoot bony structures are intact.  Calcaneal spurs are noted. IMPRESSION: No acute hip or femur fracture. Degenerative changes involving the knee but no acute fracture. Could not exclude the possibility of a quadriceps tendon injury. No acute ankle fracture. Electronically Signed   By: Marijo Sanes M.D.   On: 06/30/2015 17:43   Dg Hip Unilat  With Pelvis 2-3 Views Right  06/30/2015  CLINICAL DATA:  [place] today while to mild showing a house. Pain in the right hip. EXAM: DG HIP (WITH OR WITHOUT PELVIS) 2-3V RIGHT COMPARISON:  None. FINDINGS: No evidence of fracture or dislocation. Mild to moderate osteoarthritis of both hip joints. IMPRESSION: No acute or traumatic finding.  Osteoarthritis of the hip joints. Electronically Signed   By: Nelson Chimes M.D.   On: 06/30/2015 17:42   Dg Femur, Min 2 Views Right  06/30/2015  CLINICAL DATA:  [place] today and injured right knee, right femur and right ankle EXAM: RIGHT KNEE - COMPLETE 4+ VIEW; RIGHT FEMUR 2 VIEWS; RIGHT ANKLE - COMPLETE 3+ VIEW COMPARISON:  None. FINDINGS: Right femur: Mild hip joint degenerative changes but no acute fracture. The visualized bony pelvis is intact. No femur fracture. Right knee: Moderate tricompartmental degenerative changes with joint space narrowing and osteophytic spurring. No acute fracture or osteochondral abnormality. Somewhat heterogeneous  appearance of the quadriceps tendon. Could not exclude a tendon injury. Right ankle: The ankle mortise is maintained. No acute ankle fracture. Mild degenerative changes. No definite joint effusion. The mid and hindfoot bony structures are intact. Calcaneal spurs are noted. IMPRESSION: No acute hip or femur fracture. Degenerative changes involving the knee but no acute fracture. Could not exclude the possibility of a quadriceps tendon injury. No acute ankle fracture. Electronically Signed   By: Marijo Sanes M.D.   On: 06/30/2015 17:43   I have personally reviewed and evaluated these images and lab results as part of my medical  decision-making.   MDM   Final diagnoses:  Leg pain  Right leg pain  Strain of quadriceps tendon, right, initial encounter  64 year old male who presents after mechanical fall. He is well-appearing and in no acute distress. Vital signs are non-concerning. No evidence of head or neck trauma. Has mild tenderness to palpation of the right hip and right ankle without deformities and full range of motion. Has most significant tenderness just superior to the right knee, with limited extension due to reported severe pain. X-rays of the right hip, knee, and ankles are normal. Suspicious for quadriceps tendon injury given significant tenderness over the area with limited extension. Placed in knee immobilizer, with plans to have him follow-up with orthopedic surgery for reevaluation. Strict return and follow-up instructions reviewed. He expressed understanding of all discharge instructions and felt comfortable with the plan of care.    Forde Dandy, MD 06/30/15 2005

## 2015-06-30 NOTE — ED Notes (Signed)
Pt here for right leg injury. sts hurts around hip/femur. 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 Pt unable to bend or bear weight on leg.

## 2015-06-30 NOTE — Discharge Instructions (Signed)
There is concern that you may have a quadriceps tendon tear. Please follow-up with orthopedic surgery for further evaluation and management. Return for worsening symptoms, including worsening pain, swelling to leg, new numbness/weakness, or any other symptoms concerning to you.     How to Use a Knee Brace A knee brace is a device that you wear to support your knee, especially if the knee is healing after an injury or surgery. There are several types of knee braces. Some are designed to prevent an injury (prophylactic brace). These are often worn during sports. Others support an injured knee (functional brace) or keep it still while it heals (rehabilitative brace). People with severe arthritis of the knee may benefit from a brace that takes some pressure off the knee (unloader brace). Most knee braces are made from a combination of cloth and metal or plastic.  You may need to wear a knee brace to:  Relieve knee pain.  Help your knee support your weight (improve stability).  Help you walk farther (improve mobility).  Prevent injury.  Support your knee while it heals from surgery or from an injury. RISKS AND COMPLICATIONS Generally, knee braces are very safe to wear. However, problems may occur, including:  Skin irritation that may lead to infection.  Making your condition worse if you wear the brace in the wrong way. HOW TO USE A KNEE BRACE Different braces will have different instructions for use. Your health care provider will tell you or show you:  How to put on your brace.  How to adjust the brace.  When and how often to wear the brace.  How to remove the brace.  If you will need any assistive devices in addition to the brace, such as crutches or a cane. In general, your brace should:  Have the hinge of the brace line up with the bend of your knee.  Have straps, hooks, or tapes that fasten snugly around your leg.  Not feel too tight or too loose. HOW TO CARE FOR A KNEE  BRACE  Check your brace often for signs of damage, such as loose connections or attachments. Your knee brace may get damaged or wear out during normal use.  Wash the fabric parts of your brace with soap and water.  Read the insert that comes with your brace for other specific care instructions. SEEK MEDICAL CARE IF:  Your knee brace is too loose or too tight and you cannot adjust it.  Your knee brace causes skin redness, swelling, bruising, or irritation.  Your knee brace is not helping.  Your knee brace is making your knee pain worse.   This information is not intended to replace advice given to you by your health care provider. Make sure you discuss any questions you have with your health care provider.   Document Released: 10/26/2003 Document Revised: 04/26/2015 Document Reviewed: 11/28/2014 Elsevier Interactive Patient Education [employer].

## 2016-05-30 DIAGNOSIS — Z23 Encounter for immunization: Secondary | ICD-10-CM | POA: Diagnosis not present

## 2016-06-05 DIAGNOSIS — G8929 Other chronic pain: Secondary | ICD-10-CM | POA: Diagnosis not present

## 2016-06-05 DIAGNOSIS — M5440 Lumbago with sciatica, unspecified side: Secondary | ICD-10-CM | POA: Diagnosis not present

## 2016-06-10 DIAGNOSIS — H35033 Hypertensive retinopathy, bilateral: Secondary | ICD-10-CM | POA: Diagnosis not present

## 2016-06-12 DIAGNOSIS — M48061 Spinal stenosis, lumbar region without neurogenic claudication: Secondary | ICD-10-CM | POA: Diagnosis not present

## 2016-06-19 DIAGNOSIS — I2510 Atherosclerotic heart disease of native coronary artery without angina pectoris: Secondary | ICD-10-CM | POA: Diagnosis not present

## 2016-06-19 DIAGNOSIS — I351 Nonrheumatic aortic (valve) insufficiency: Secondary | ICD-10-CM | POA: Diagnosis not present

## 2016-06-19 DIAGNOSIS — E785 Hyperlipidemia, unspecified: Secondary | ICD-10-CM | POA: Diagnosis not present

## 2016-06-19 DIAGNOSIS — R55 Syncope and collapse: Secondary | ICD-10-CM | POA: Diagnosis not present

## 2016-06-19 DIAGNOSIS — I10 Essential (primary) hypertension: Secondary | ICD-10-CM | POA: Diagnosis not present

## 2016-06-20 DIAGNOSIS — G5603 Carpal tunnel syndrome, bilateral upper limbs: Secondary | ICD-10-CM | POA: Diagnosis not present

## 2016-07-13 DIAGNOSIS — S60456A Superficial foreign body of right little finger, initial encounter: Secondary | ICD-10-CM | POA: Diagnosis not present

## 2016-07-13 DIAGNOSIS — I10 Essential (primary) hypertension: Secondary | ICD-10-CM | POA: Diagnosis not present

## 2016-07-13 DIAGNOSIS — M795 Residual foreign body in soft tissue: Secondary | ICD-10-CM | POA: Diagnosis not present

## 2016-07-13 DIAGNOSIS — M791 Myalgia: Secondary | ICD-10-CM | POA: Diagnosis not present

## 2016-07-15 DIAGNOSIS — G5602 Carpal tunnel syndrome, left upper limb: Secondary | ICD-10-CM | POA: Diagnosis not present

## 2016-07-15 DIAGNOSIS — Z7982 Long term (current) use of aspirin: Secondary | ICD-10-CM | POA: Diagnosis not present

## 2016-07-15 DIAGNOSIS — I2510 Atherosclerotic heart disease of native coronary artery without angina pectoris: Secondary | ICD-10-CM | POA: Diagnosis not present

## 2016-07-15 DIAGNOSIS — Z87891 Personal history of nicotine dependence: Secondary | ICD-10-CM | POA: Diagnosis not present

## 2016-07-15 DIAGNOSIS — E785 Hyperlipidemia, unspecified: Secondary | ICD-10-CM | POA: Diagnosis not present

## 2016-07-15 DIAGNOSIS — I10 Essential (primary) hypertension: Secondary | ICD-10-CM | POA: Diagnosis not present

## 2016-07-15 DIAGNOSIS — G4730 Sleep apnea, unspecified: Secondary | ICD-10-CM | POA: Diagnosis not present

## 2016-08-21 DIAGNOSIS — J019 Acute sinusitis, unspecified: Secondary | ICD-10-CM | POA: Diagnosis not present

## 2016-08-26 DIAGNOSIS — I517 Cardiomegaly: Secondary | ICD-10-CM | POA: Diagnosis not present

## 2016-08-26 DIAGNOSIS — Z0000 Encounter for general adult medical examination without abnormal findings: Secondary | ICD-10-CM | POA: Diagnosis not present

## 2016-08-26 DIAGNOSIS — R001 Bradycardia, unspecified: Secondary | ICD-10-CM | POA: Diagnosis not present

## 2016-08-28 DIAGNOSIS — R221 Localized swelling, mass and lump, neck: Secondary | ICD-10-CM | POA: Diagnosis not present

## 2016-08-28 DIAGNOSIS — E041 Nontoxic single thyroid nodule: Secondary | ICD-10-CM | POA: Diagnosis not present

## 2016-09-16 DIAGNOSIS — H04123 Dry eye syndrome of bilateral lacrimal glands: Secondary | ICD-10-CM | POA: Diagnosis not present

## 2016-09-23 DIAGNOSIS — L570 Actinic keratosis: Secondary | ICD-10-CM | POA: Diagnosis not present

## 2016-09-23 DIAGNOSIS — L821 Other seborrheic keratosis: Secondary | ICD-10-CM | POA: Diagnosis not present

## 2016-09-23 DIAGNOSIS — Z08 Encounter for follow-up examination after completed treatment for malignant neoplasm: Secondary | ICD-10-CM | POA: Diagnosis not present

## 2016-09-23 DIAGNOSIS — Z85828 Personal history of other malignant neoplasm of skin: Secondary | ICD-10-CM | POA: Diagnosis not present

## 2017-05-01 DIAGNOSIS — I255 Ischemic cardiomyopathy: Secondary | ICD-10-CM | POA: Diagnosis not present

## 2017-05-01 DIAGNOSIS — R51 Headache: Secondary | ICD-10-CM | POA: Diagnosis not present

## 2017-05-01 DIAGNOSIS — M545 Low back pain: Secondary | ICD-10-CM | POA: Diagnosis not present

## 2017-05-01 DIAGNOSIS — F3289 Other specified depressive episodes: Secondary | ICD-10-CM | POA: Diagnosis not present

## 2017-05-01 DIAGNOSIS — G4733 Obstructive sleep apnea (adult) (pediatric): Secondary | ICD-10-CM | POA: Diagnosis not present

## 2017-05-01 DIAGNOSIS — Z1389 Encounter for screening for other disorder: Secondary | ICD-10-CM | POA: Diagnosis not present

## 2017-05-01 DIAGNOSIS — M79671 Pain in right foot: Secondary | ICD-10-CM | POA: Diagnosis not present

## 2017-05-01 DIAGNOSIS — Z23 Encounter for immunization: Secondary | ICD-10-CM | POA: Diagnosis not present

## 2017-05-01 DIAGNOSIS — E298 Other testicular dysfunction: Secondary | ICD-10-CM | POA: Diagnosis not present

## 2017-05-01 DIAGNOSIS — I10 Essential (primary) hypertension: Secondary | ICD-10-CM | POA: Diagnosis not present

## 2017-05-01 DIAGNOSIS — N528 Other male erectile dysfunction: Secondary | ICD-10-CM | POA: Diagnosis not present

## 2017-05-01 DIAGNOSIS — Z6837 Body mass index (BMI) 37.0-37.9, adult: Secondary | ICD-10-CM | POA: Diagnosis not present

## 2017-06-03 ENCOUNTER — Encounter: Payer: Self-pay | Admitting: Neurology

## 2017-06-04 ENCOUNTER — Encounter: Payer: Self-pay | Admitting: Neurology

## 2017-06-05 ENCOUNTER — Ambulatory Visit (INDEPENDENT_AMBULATORY_CARE_PROVIDER_SITE_OTHER): Payer: Medicare Other | Admitting: Neurology

## 2017-06-05 ENCOUNTER — Encounter: Payer: Self-pay | Admitting: Neurology

## 2017-06-05 VITALS — BP 161/92 | HR 53 | Ht 72.0 in | Wt 280.0 lb

## 2017-06-05 DIAGNOSIS — G4719 Other hypersomnia: Secondary | ICD-10-CM | POA: Diagnosis not present

## 2017-06-05 DIAGNOSIS — Z9989 Dependence on other enabling machines and devices: Secondary | ICD-10-CM

## 2017-06-05 DIAGNOSIS — G4733 Obstructive sleep apnea (adult) (pediatric): Secondary | ICD-10-CM

## 2017-06-05 DIAGNOSIS — G4710 Hypersomnia, unspecified: Secondary | ICD-10-CM | POA: Diagnosis not present

## 2017-06-05 DIAGNOSIS — E669 Obesity, unspecified: Secondary | ICD-10-CM | POA: Diagnosis not present

## 2017-06-05 NOTE — Patient Instructions (Signed)
Y may  have a condition called narcolepsy: This means, that you have a sleep disorder that manifests with at times severe excessive sleepiness during the day and often with problems with sleep at night. We may have to try different medications that may help you stay awake during the day. Not everything works with everybody the same way. Wake promoting agents include stimulants and non-stimulant type medications. The most common side effects with stimulants are weight loss, insomnia, nervousness, headaches, palpitations, rise in blood pressure, anxiety. Stimulants can be addictive and subject to abuse. Non-stimulant type wake promoting medications include Provigil and Nuvigil, most common side effects include headaches, nervousness, insomnia, hypertension. In addition there is a medication called Xyrem which has been proven to be very effective in patients with narcolepsy with or without cataplexy. Some patients with narcolepsy report episodes of weakness, such as jaw or facial weakness, legs giving out, feeling wobbly or like "Jell-o", etc. in situations of anxiety, stress, laughter, sudden sadness, surprise, etc., which is called cataplexy. You can also experience episodes of sleep paralysis during which you may feel unable to move upon awakening. Some people experience dreamlike sequences upon awakening or upon drifting off to sleep, called hypnopompic or hypnagogic hallucinations.

## 2017-06-05 NOTE — Progress Notes (Signed)
SLEEP MEDICINE CLINIC   Provider:  Larey Seat, M D  Primary Care Physician:  Leanna Battles, MD   Referring Provider: Red Christians, MD   Chief Complaint  Patient presents with  . New Patient (Initial Visit)    pt alone, rm 10. pt had a sleep study July of 2013.     HPI:  Alexander [name] is a 66 y.o. male , seen here as in a referral  from Dr. Philip Aspen,  a caucasian right handed married male patient from [place].  Mr. Enrique carries a diagnosis of sleep apnea as well as hypertension, narcolepsy, myopathy, possibly neuropathy and vasovagal syncope. Further listed are arthritis, hypercholesterolemia,  depression-anxiety, degenerative disc disease, and heart disease/ cardiomyopathy- with an EF of < 50%. He failed a tilt table test.   He was diagnosed with sleep apnea in the year 10-21-1997, and was placed on CPAP. He is continued to use CPAP his last sleep study was 5 years ago in [place], performed on 02/19/2012 at [facility]. The final impression was the presence of severe sleep apnea and snoring the patient had 222 obstructive apneas, 12 centrals and 25 mixed apneas and 14 hypopneas.  During REM sleep his AHI was 25.8 per hour, total AHI was 47.7 per hour. The patient has been happy with his machine which is now 66 years old and not as reliable, it is also difficult to travel with. I was able to see a compliance download today, the patient has used the machine 30 out of 30 days with a compliance of over 4 hours for 96.7% of the time this is an auto CPAP mean pressure is 9.4 cm water and average device pressure is 13 cm water with an average AHI of 4.8, which is a sufficient reduction.  Sleep habits are as follows: [name] usual bedtime is around 11 PM, he retreats to his bedroom, which is cool, quiet and dark. He shares a bedroom with his wife, he prefers right lateral sleep position with one pillow. He is usually asleep promptly and stays asleep for 4-5  hours before he first wakes usually to urinate. Nocturia is usually only once at night. Most nights he can continue to sleep until 7:30 and wakes up spontaneously. He used to wake up with headaches in the morning, but the headaches did not wake him. This has been reduced since he was placed on topiramate by Dr. Philip Aspen. He only takes 25 mg at bedtime.  Sleep medical history and family sleep history:  See above. Sleep apnea did affect father , who died in 10/21/80, without knowledge of the diagnosis. Had DM , too.    Social history: married, originally from [place], lived in [place], [place], now in [place].  Son is 58 , daughter is 32, with 3 children.  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 . Retired 1/ 2016. He quit smoking in 10/22/1994, drinks about 8 ounces of alcohol a week in form of wine. Caffeine use in form of coffee, 3-4 cups a day, 3 caffeinated sodas a day, no ice tea or energy drinks.  Review of Systems: Out of a complete 14 system review, the patient complains of only the following symptoms, and all other reviewed systems are negative.  Fatigue, palpitations, ankle and leg swelling, tinnitus, vertigo, trouble swallowing, aching muscles, joint pain, shortness of breath, snoring, feeling hot, increased thirst, flushing, easy bruising, confusion, headaches, numbness, difficulties with dizziness depression, anxiety and restless  legs at times.  The patient is status post tonsillectomy.  Epworth score 24/24  , Fatigue severity score 63 points , depression score 8/15    Social History   Social History  . Marital status: Married    Spouse name: N/A  . Number of children: N/A  . Years of education: N/A   Occupational History  . Not on file.   Social History Main Topics  . Smoking status: Former Smoker    Quit date: 1996  . Smokeless tobacco: Never Used  . Alcohol use No  . Drug use: No  . Sexual activity: Not on file   Other Topics Concern  . Not on file    Social History Narrative  . No narrative on file    Family History  Problem Relation Age of Onset  . Cancer Mother   . Heart disease Mother   . Depression Mother   . Cancer Father   . Hypertension Father   . High Cholesterol Father   . Heart disease Father   . Diabetes Father   . Hypertension Sister   . High Cholesterol Sister     Past Medical History:  Diagnosis Date  . Cardiomyopathy ([place])   . DDD (degenerative disc disease), lumbar   . Dyslipidemia   . Hypertension   . Hypogonadism male   . IBS (irritable bowel syndrome)   . Obstructive sleep apnea     Past Surgical History:  Procedure Laterality Date  . CARPAL TUNNEL RELEASE Left   . HAMMER TOE SURGERY Right   . MENISCUS REPAIR    . ORIF WRIST FRACTURE Bilateral   . PLANTAR FASCIA RELEASE Right   . SHOULDER ARTHROSCOPY Left   . TENDON REPAIR Right    right quad tendon repair    Current Outpatient Prescriptions  Medication Sig Dispense Refill  . aspirin 325 MG tablet Take 325 mg by mouth daily.     . B Complex Vitamins (VITAMIN B COMPLEX PO) Take 2,500 mcg by mouth daily.     Marland Kitchen buPROPion (WELLBUTRIN SR) 100 MG 12 hr tablet TK 1 T PO QAM  12  . Calcium Carbonate (CALCIUM 600 PO) Take 1 tablet by mouth daily.    . carvedilol (COREG) 6.25 MG tablet Take 6.25 mg by mouth 2 (two) times daily with a meal.    . Cholecalciferol (VITAMIN D3) 5000 units TABS Take by mouth.    Marland Kitchen lisinopril (PRINIVIL,ZESTRIL) 10 MG tablet Take 10 mg by mouth daily.    . Magnesium 250 MG TABS Take 30 mg by mouth daily.    . Multiple Vitamin (MULTIVITAMIN WITH MINERALS) TABS tablet Take 1 tablet by mouth daily.    . simvastatin (ZOCOR) 20 MG tablet Take 20 mg by mouth daily.    . Testosterone (FORTESTA) 10 MG/ACT (2%) GEL Place 40 mg onto the skin daily.     Marland Kitchen topiramate (TOPAMAX) 25 MG tablet   12  . Vilazodone HCl (VIIBRYD) 40 MG TABS Take 40 mg by mouth daily.     No current facility-administered medications for this visit.      Allergies as of 06/05/2017  . (No Known Allergies)    Vitals: BP (!) 161/92   Pulse (!) 53   Ht 6' (1.829 m)   Wt 280 lb (127 kg)   BMI 37.97 kg/m  Last Weight:  Wt Readings from Last 1 Encounters:  06/05/17 280 lb (127 kg)   LZJ:QBHA mass index is 37.97 kg/m.  Last Height:   Ht Readings from Last 1 Encounters:  06/05/17 6' (1.829 m)    Physical exam:  General: The patient is awake, alert and appears not in acute distress. The patient is well groomed. Head: Normocephalic, atraumatic. Neck is supple. Mallampati 3  neck circumference:17.5 . Nasal airflow patent, TMJ click  is evident . Retrognathia is not seen.  Biological teeth on top, bottom dentures.  Cardiovascular:  Regular rate and rhythm, without  murmurs or carotid bruit, and without distended neck veins. Respiratory: Lungs are clear to auscultation. Skin:  Without evidence of edema, or rash Trunk: BMI is 38. The patient's posture is erect  Neurologic exam : The patient is awake and alert, oriented to place and time.   Memory subjective described as intact.  Attention span & concentration ability appears normal.  Speech is fluent,  without dysarthria, dysphonia or aphasia.  Mood and affect are appropriate.  Cranial nerves: Pupils are equal and briskly reactive to light.  Extraocular movements  in vertical and horizontal planes intact and without nystagmus. Visual fields by finger perimetry are intact.Hearing to finger rub intact.  Facial sensation intact to fine touch. Facial motor strength is symmetric and tongue and uvula move midline. Shoulder shrug was symmetrical.  Motor exam:  Normal tone, muscle bulk and symmetric strength in all extremities. Some loss of grip strength  Sensory:  Fine touch, pinprick and vibration were normal. Coordination: Rapid alternating movements in the fingers/hands was normal. Finger-to-nose maneuver  normal . Gait and station: Patient walks without assistive device.Tandem gait  is unfragmented. Turns with 3 Steps.  Deep tendon reflexes: in the  upper and lower extremities are symmetric and intact. Babinski maneuver deferred.    Assessment:  After physical and neurologic examination, review of laboratory studies,  Personal review of imaging studies, reports of other /same  Imaging studies, results of polysomnography and / or neurophysiology testing and pre-existing records as far as provided in visit., my assessment is   1) OSA on CPAP , severe OSA tested in 6/ 2013. This is likely unchanged. Needs a new machine, and this should be an auto-titrator again.   2) vertigo- not addressed today, but signs of nystagmus in physical exam. .   3) EDS- Epworth 24 - Narcolepsy? Depression? This is  while on CPAP compliantly - the patient will be invited for a PSG on CPAP and followed by [facility]- Has to wean off ViBryid and bupripion.Cannot do this now-  Had possible isolated events of sleep paralysis, no dream hallucinations. No cataplexy.    The patient was advised of the nature of the diagnosed disorder , the treatment options and the  risks for general health and wellness arising from not treating the condition.   I spent more than 55 minutes of face to face time with the patient.  Greater than 50% of time was spent in counseling and coordination of care. We have discussed the diagnosis and differential and I answered the patient's questions.    Plan:  Treatment plan and additional workup : Split night for CPAP user, SPLIT at AHI 20.  RV after test.  HLA narcolepsy  Larey Seat, MD 13/24/4010, 2:72 AM  Certified in Neurology by ABPN Certified in [place] by [facility] [address] [place], [place] [address]

## 2017-06-10 LAB — NARCOLEPSY EVALUATION
DQA1*01:02: NEGATIVE
DQB1*06:02: NEGATIVE

## 2017-06-11 ENCOUNTER — Telehealth: Payer: Self-pay | Admitting: Neurology

## 2017-06-11 NOTE — Telephone Encounter (Signed)
Called patient and made him aware the the test for narcolepsy came back negative. I have informed the patient that the sleep lab will still call once they have insruance approval to set the pt up for sleep study. Pt verbalized understanding. Pt had no questions at this time but was encouraged to call back if questions arise.

## 2017-06-11 NOTE — Telephone Encounter (Signed)
-----  Message from Larey Seat, MD sent at 06/10/2017  4:54 PM EDT ----- Negative narcolepsy panel / HLA results. Narcolepsy antigen is not found.

## 2017-06-17 ENCOUNTER — Ambulatory Visit (INDEPENDENT_AMBULATORY_CARE_PROVIDER_SITE_OTHER): Payer: Medicare Other | Admitting: Neurology

## 2017-06-17 DIAGNOSIS — G4733 Obstructive sleep apnea (adult) (pediatric): Secondary | ICD-10-CM | POA: Diagnosis not present

## 2017-06-17 DIAGNOSIS — G4719 Other hypersomnia: Secondary | ICD-10-CM

## 2017-06-17 DIAGNOSIS — E669 Obesity, unspecified: Secondary | ICD-10-CM

## 2017-06-17 DIAGNOSIS — Z9989 Dependence on other enabling machines and devices: Secondary | ICD-10-CM

## 2017-06-17 DIAGNOSIS — G4710 Hypersomnia, unspecified: Secondary | ICD-10-CM

## 2017-06-18 DIAGNOSIS — I10 Essential (primary) hypertension: Secondary | ICD-10-CM | POA: Diagnosis not present

## 2017-06-18 DIAGNOSIS — Z6836 Body mass index (BMI) 36.0-36.9, adult: Secondary | ICD-10-CM | POA: Diagnosis not present

## 2017-06-18 DIAGNOSIS — R51 Headache: Secondary | ICD-10-CM | POA: Diagnosis not present

## 2017-06-18 DIAGNOSIS — I255 Ischemic cardiomyopathy: Secondary | ICD-10-CM | POA: Diagnosis not present

## 2017-06-19 ENCOUNTER — Telehealth: Payer: Self-pay | Admitting: Neurology

## 2017-06-19 ENCOUNTER — Other Ambulatory Visit: Payer: Self-pay | Admitting: Neurology

## 2017-06-19 HISTORY — PX: TRANSTHORACIC ECHOCARDIOGRAM: SHX275

## 2017-06-19 NOTE — Telephone Encounter (Signed)
I called pt. I advised pt that Dr. Brett Fairy reviewed their sleep study results and found that pt has severe sleep apnea. Dr. Brett Fairy recommends that pt starts Bipap. I reviewed PAP compliance expectations with the pt. Pt is agreeable to starting a CPAP. I advised pt that an order will be sent to a DME, [facility], and [facility] will call the pt within about one week after they file with the pt's insurance. [facility] will show the pt how to use the machine, fit for masks, and troubleshoot the CPAP if needed. A follow up appt was made for insurance purposes with Justin Mend on Aug 28 2016 at 8:00 am. Pt verbalized understanding to arrive 15 minutes early and bring their CPAP. A letter with all of this information in it will be mailed to the pt as a reminder. I verified with the pt that the address we have on file is correct. Pt verbalized understanding of results. Pt had no questions at this time but was encouraged to call back if questions arise.

## 2017-06-19 NOTE — Telephone Encounter (Signed)
-----   Message from Larey Seat, MD sent at 06/19/2017 12:25 PM EDT ----- This patients unusual constellation of high CPAP compliance and persistent excessive daytime sleepiness may be caused by insufficient CPAP pressures. The study confirmed severe OSA to be present, and OSA responded finally not to CPAP but BiPAP at 20-17 cm water. The patient should be fitted for a comfortable interface by DME- see note above. I oredered BiPAP. CD

## 2017-06-19 NOTE — Procedures (Signed)
PATIENT'S NAME:  Alexander [name], Alexander [name] DOB:      [birth year]      MR#:    [id number]     DATE OF RECORDING: 06/17/2017 REFERRING M.D.:  Hayden Rasmussen, M.D. Study Performed:  Split-Night Titration Study HISTORY:  Mr. Steffenhagen carries a diagnosis of sleep apnea as well as hypertension, narcolepsy, myopathy, possibly neuropathy and vasovagal syncope. Further listed are arthritis, hypercholesterolemia, depression-anxiety, degenerative disc disease, and heart disease/ cardiomyopathy- with an EF of < 50%. He failed a tilt table test.   He was first diagnosed with sleep apnea in the year 1999, and was placed on CPAP. He is continued to use CPAP his last sleep study was 5 years ago in [place], performed on 02/19/2012 at [facility]. The final impression was the presence of severe sleep apnea and snoring; the patient had 222 obstructive apneas, 12 centrals and 25 mixed apneas and 14 hypopneas.  During REM sleep his AHI was 25.8 per hour, total AHI was 47.7 per hour. The patient has been happy with his machine which is now 66 years old and not as reliable, it is also difficult to travel with. I was able to see a compliance download today, the patient has used the machine 30 out of 30 days with a compliance of over 4 hours for 96.7% of the time this is an auto CPAP mean pressure is 9.4 cm water and average device pressure is 13 cm water with an average AHI of 4.8, which is a sufficient reduction.  The patient endorsed the Epworth Sleepiness Scale at 24/24 points - excessive sleepiness. The patient's weight 280 pounds with a height of 72 (inches), resulting in a BMI of 37.9 kg/m2. The patient's neck circumference measured 17.5 inches.  CURRENT MEDICATIONS: Aspirin, B-complex, Bupropion, Calcium, Carvedilol, Cholecalciferol, Lisinopril, Magnesium, Multi-Vitamin, Simvastatin, Testosterone, Topiramate and Vilazodone.  PROCEDURE:  This is a multichannel digital polysomnogram utilizing the Somnostar 11.2  system.  Electrodes and sensors were applied and monitored per AASM Specifications.   EEG, EOG, Chin and Limb EMG, were sampled at 200 Hz.  ECG, Snore and Nasal Pressure, Thermal Airflow, Respiratory Effort, CPAP Flow and Pressure, Oximetry was sampled at 50 Hz. Digital video and audio were recorded.      BASELINE STUDY WITHOUT CPAP RESULTS: Lights Out was at 21:14 and Lights On at 05:40.  Total recording time (TRT) was 243, with a total sleep time (TST) of 160.5 minutes.   The patient's sleep latency was 45.5 minutes.  REM sleep was not recorded.  The sleep efficiency was 66.0 %.    SLEEP ARCHITECTURE: WASO (Wake after sleep onset) was 42.5 minutes, Stage N1 was 18 minutes, Stage N2 was 142.5 minutes, Stage N3 was 0 minutes and Stage R (REM sleep) was 0 minutes.  The percentages were Stage N1 11.2%, Stage N2 88.8%, Stage N3 0% and Stage R (REM sleep) 0%.   RESPIRATORY ANALYSIS:  There were a total of 124 respiratory events:  17 obstructive apneas, 0 central apneas and 0 mixed apneas with a total of 17 apneas and an apnea index (AI) of 6.4. There were 107 hypopneas with a hypopnea index of 40. The patient also had 0 respiratory event related arousals (RERAs). Snoring was noted.     The total APNEA/HYPOPNEA INDEX (AHI) was 46.4 /hour and the total RESPIRATORY DISTURBANCE INDEX was 46.4 /hour.  0 events occurred in REM sleep and 214 events in NREM. The REM AHI was 0, /hour versus a non-REM AHI of 46.4 /hour.  The patient spent 275 minutes sleep time in the supine position 121 minutes in non-supine. The supine AHI was 63.1 /hour versus a non-supine AHI of 39.1 /hour.  OXYGEN SATURATION & C02:  The wake baseline 02 saturation was 90%, with the lowest being 83%. Time spent below 89% saturation equaled 53 minutes.  PERIODIC LIMB MOVEMENTS: The patient had a total of 26 Periodic Limb Movements.  The Periodic Limb Movement (PLM) index was 9.7 /hour and the PLM Arousal index was 3.4 /hour. The arousals were noted  as: 9 were spontaneous, 9 were associated with PLMs, and 123 were associated with respiratory events.  Audio and video analysis did not show any abnormal or unusual movements, behaviors, phonations or vocalizations. The patient took bathroom breaks. EKG was in keeping with sinus rhythm (NSR)  TITRATION STUDY WITH CPAP RESULTS:   CPAP was initiated at 5 cmH20 with heated humidity per AASM split night standards and pressure was advanced to CPAP at 18 cm water, but Ahi remained 18.5/hr. The technologist changed to BiPAP 18/15 and advanced step wise to 20/17 cmH20 because of hypopneas, apneas and desaturations.  At a BiPAP pressure of 20/ 17 cmH20, there was a reduction of the AHI to 0.0 /hour. Total recording time (TRT) was 263.5 minutes, with a total sleep time (TST) of 235 minutes. The patient's sleep latency was 28 minutes. REM latency was 111.5 minutes.  The sleep efficiency was 89.2 %.    SLEEP ARCHITECTURE: Wake after sleep was 23.5 minutes, Stage N1 10 minutes, Stage N2 174.5 minutes, Stage N3 0 minutes and Stage R (REM sleep) 50.5 minutes. The percentages were: Stage N1 4.3%, Stage N2 74.3%, Stage N3 0% and Stage R (REM sleep) 21.5%.  The arousals were noted as: 17 were spontaneous, 0 were associated with PLMs, and 117 were associated with respiratory events.  RESPIRATORY ANALYSIS:  There were a total of 119 respiratory events: 0 obstructive apneas, 0 central apneas and 0 mixed apneas with a total of 0 apneas and an apnea index (AI) of 0. There were 119 hypopneas with a hypopnea index of 30.4 /hour. The patient also had 0 respiratory event related arousals (RERAs).     The total APNEA/HYPOPNEA INDEX (AHI) was 30.4 /hour and the total RESPIRATORY DISTURBANCE INDEX was 30.4 /hour. 12 events occurred in REM sleep and 107 events in NREM. The REM AHI was 14.3 /hour versus a non-REM AHI of 34.8 /hour. The patient spent 96% of total sleep time in the supine position. The supine AHI was 30.7 /hour, versus  a non-supine AHI of 21.2/hour.  OXYGEN SATURATION & C02:  The wake baseline 02 saturation was 94%, with the lowest being 83%. Time spent below 89% saturation equaled 34 minutes. PERIODIC LIMB MOVEMENTS:   The patient had a total of 0 Periodic Limb Movements. The arousals were noted as: 9 were spontaneous, 9 were associated with PLMs, and 123 were associated with respiratory events.   Audio and video analysis did not show any abnormal or unusual movements, behaviors, phonations or vocalizations The patient took bathroom breaks Loud Snoring was noted before CPAP was initiated, and mouth breathing continued under medium P10 nasal pillow interface. The patient's EKG was in keeping with normal sinus rhythm (NSR)   POLYSOMNOGRAPHY IMPRESSION :   1. Severe Obstructive Sleep Apnea (OSA) with AHI of 46.4/hr., not responsive to CPAP and low pressure BiPAP.  2. Persistent HTN with systolic BP of 132 mmHg.  3. Patient should be using BiPAP 20/17 cm water. This  setting allowed alleviation of apnea.  4. The patient was finally fitted with a large nasal pillow, P 10. I would much prefer using a nasal mask at the high pressures required.    RECOMMENDATIONS:  1. Advise to start BiPAP at 20/16 cmH2O and follow clinical symptomatology.   An AirFit P10 in large size was finally used with heated humidity during this study.  Advise to add heated humidity.  Adjust interface and heated humidity as needed. The patient was finally fitted with a large nasal pillow, P 10. I would much prefer using a nasal mask at the high pressures required- if the patient is comfortable with this choice.    2. Compliance to PAP therapy should be emphasized.  Compliance, AHI and air leak information to be downloaded for objective assessment at 30 days, 180 days and annually thereafter.   3. Further information regarding OSA may be obtained from [employer] (www.sleepfoundation.org) or American Sleep Apnea Association  (www.sleepapnea.org). 4. A follow up appointment will be scheduled in the Sleep Clinic at [facility].      I certify that I have reviewed the entire raw data recording prior to the issuance of this report in accordance with the Standards of Accreditation of the American Academy of Sleep Medicine (AASM)    Larey Seat, M.D.  06-19-2017  Diplomat, American Board of Psychiatry and Neurology  Diplomat, [place] [facility] Medical Director, [place] [facility] [employer]

## 2017-06-19 NOTE — Addendum Note (Signed)
Addended by: Larey Seat on: 06/19/2017 12:26 PM   Modules accepted: Orders

## 2017-06-20 ENCOUNTER — Encounter: Payer: Self-pay | Admitting: Cardiology

## 2017-06-20 ENCOUNTER — Ambulatory Visit (INDEPENDENT_AMBULATORY_CARE_PROVIDER_SITE_OTHER): Payer: Medicare Other | Admitting: Cardiology

## 2017-06-20 VITALS — BP 142/98 | HR 63 | Ht 72.0 in | Wt 276.8 lb

## 2017-06-20 DIAGNOSIS — R55 Syncope and collapse: Secondary | ICD-10-CM | POA: Diagnosis not present

## 2017-06-20 DIAGNOSIS — I10 Essential (primary) hypertension: Secondary | ICD-10-CM

## 2017-06-20 DIAGNOSIS — R0789 Other chest pain: Secondary | ICD-10-CM | POA: Insufficient documentation

## 2017-06-20 DIAGNOSIS — Z8679 Personal history of other diseases of the circulatory system: Secondary | ICD-10-CM | POA: Diagnosis not present

## 2017-06-20 DIAGNOSIS — I208 Other forms of angina pectoris: Secondary | ICD-10-CM | POA: Diagnosis not present

## 2017-06-20 DIAGNOSIS — R0609 Other forms of dyspnea: Secondary | ICD-10-CM | POA: Diagnosis not present

## 2017-06-20 DIAGNOSIS — I209 Angina pectoris, unspecified: Secondary | ICD-10-CM

## 2017-06-20 DIAGNOSIS — R0600 Dyspnea, unspecified: Secondary | ICD-10-CM

## 2017-06-20 NOTE — Patient Instructions (Signed)
Medication Instructions:   NO CHANGE  Testing/Procedures:  Your physician has requested that you have an echocardiogram. Echocardiography is a painless test that uses sound waves to create images of your heart. It provides your doctor with information about the size and shape of your heart and how well your heart's chambers and valves are working. This procedure takes approximately one hour. There are no restrictions for this procedure.    Follow-Up:  Your physician recommends that you schedule a follow-up appointment in: 1-2 MONTHS WITH DR [facility]     Please arrive at the [facility] main entrance of [facility] at xx:xx AM (30-45 minutes prior to test start time)  [facility] [place], [place] [address] [phone]  Proceed to the [facility] Radiology Department (First Floor).  Please follow these instructions carefully (unless otherwise directed):  Hold all erectile dysfunction medications at least 48 hours prior to test.  On the Night Before the Test: . Drink plenty of water. . Do not consume any caffeinated/decaffeinated beverages or chocolate 12 hours prior to your test. . Do not take any antihistamines 12 hours prior to your test. . If you take Metformin do not take 24 hours prior to test. . If the patient has contrast allergy: ? Patient will need a prescription for Prednisone and very clear instructions (as follows): 1. Prednisone 50 mg - take 13 hours prior to test 2. Take another Prednisone 50 mg 7 hours prior to test 3. Take another Prednisone 50 mg 1 hour prior to test 4. Take Benadryl 50 mg 1 hour prior to test . Patient must complete all four doses of above prophylactic medications. . Patient will need a ride after test due to Benadryl.  On the Day of the Test: . Drink plenty of water. Do not drink any water within one hour of the test. . Do not eat any food 4 hours prior to the test. . You may take your regular  medications prior to the test. . IF NOT ON A BETA BLOCKER - Take 50 mg of lopressor (metoprolol) one hour before the test. . HOLD Furosemide morning of the test.  After the Test: . Drink plenty of water. . After receiving IV contrast, you may experience a mild flushed feeling. This is normal. . On occasion, you may experience a mild rash up to 24 hours after the test. This is not dangerous. If this occurs, you can take Benadryl 25 mg and increase your fluid intake. . If you experience trouble breathing, this can be serious. If it is severe call 911 IMMEDIATELY. If it is mild, please call our office. . If you take any of these medications: Glipizide/Metformin, Avandament, Glucavance, please do not take 48 hours after completing test.

## 2017-06-20 NOTE — Progress Notes (Signed)
PCP: Alexander Battles, MD Neurologist: Dr. Brett [name]  Clinic Note: Chief Complaint  Patient presents with  . New Patient (Initial Visit)  . Shortness of Breath    With some discomfort in the chest associated with exertion    HPI:  Alexander [name] is a 66 y.o. male who is being seen today to reestablish cardiology care at the request of Alexander Battles, MD.  He has a history of cardiac disease dating back to 1996 --> had cardioversion x2 during the cath.  He had MI in 1996 --> as a result he developed cardiomyopathy with reduced ejection fraction and was forced to retire from [employer].  By 2000, his EF apparently had improved.  It sounds like he had heart catheterization in 2000 that showed low EF but nonobstructive disease.  Also has a history of vasodepressor syncope with abnormal tilt table test  Essential hypertension  Hyperlipidemia  Aortic insufficiency by echo - noted as trace AI by echo in 2016  Alexander [name] was last seen on Jun 19, 2016 by Dr. Marijo [name] -at that time he had no real symptoms.  Blood pressure was stable.  Recent Hospitalizations: None  Studies Personally Reviewed - (if available, images/films reviewed: From Epic Chart or Care Everywhere)  Last echo was October 2016: By report had normal EF with trace AI.  Unfortunately the full report is not available)  Interval History: Alexander [name] presents today to establish cardiology care.  He really has been followed mostly for hypertension and hyperlipidemia.  Mostly he notices having some headaches that be related to high blood pressure as well as some fatigue.  He has not had any further syncopal episodes since diagnosis with vasovagal syncope a few years ago.  He still gets some dizziness if he stands up quickly with some near syncope on occasion, but rare. He has not had any chest tightness or pressure with rest or exertion, but if he walks around quite a bit, he may get some exertional dyspnea which is  more notable than he had in the past.  He notes that occasionally he will get a little discomfort in his chest if he exerts himself strenuously and gets short of breath. He denies any resting chest discomfort or dyspnea, PND orthopnea.  No edema.  Remainder of cardiac review of symptoms:  No rapid or irregular heartbeats/palpitations - but he does occasionally feel some skipped beats and pauses. He does have his positional dizziness and lightheadedness, but only near syncope.  No true syncope. No TIA/amaurosis fugax symptoms.  No claudication.  ROS: A comprehensive was performed. Review of Systems  Constitutional: Negative for malaise/fatigue.  HENT: Negative for congestion, nosebleeds and sinus pain.   Respiratory: Positive for shortness of breath (Only with exertion). Negative for cough.   Cardiovascular: Negative for leg swelling.       Per HPI  Gastrointestinal: Negative for blood in stool and melena.  Genitourinary: Negative for hematuria.  Musculoskeletal: Negative for falls, joint pain and myalgias.  Neurological: Positive for dizziness (per HPI) and headaches (when BP is high).  Psychiatric/Behavioral: Negative for memory loss. The patient is not nervous/anxious and does not have insomnia.   All other systems reviewed and are negative.  I have reviewed and (if needed) personally updated the patient's problem list, medications, allergies, past medical and surgical history, social and family history.   Past Medical History:  Diagnosis Date  . DDD (degenerative disc disease), lumbar   . Dyslipidemia   . Hypertension   . Hypogonadism  male   . IBS (irritable bowel syndrome)   . Obstructive sleep apnea   . Personal history of cardiomyopathy 1996   Resolved by 2000, most recent echo showed normal EF in 2016    Past Surgical History:  Procedure Laterality Date  . CARPAL TUNNEL RELEASE Left   . HAMMER TOE SURGERY Right   . MENISCUS REPAIR    . ORIF WRIST FRACTURE Bilateral     . PLANTAR FASCIA RELEASE Right   . SHOULDER ARTHROSCOPY Left   . TENDON REPAIR Right    right quad tendon repair  . Tilt Table Test     Noted to have vasovagal syncope  . TRANSTHORACIC ECHOCARDIOGRAM  05/2015   By report, normal EF, trace AI.  Otherwise essentially normal    Current Meds  Medication Sig  . aspirin 325 MG tablet Take 325 mg by mouth daily.   . B Complex Vitamins (VITAMIN B COMPLEX PO) Take 2,500 mcg by mouth daily.   Marland Kitchen buPROPion (WELLBUTRIN SR) 100 MG 12 hr tablet TK 1 T PO QAM  . Calcium Carbonate (CALCIUM 600 PO) Take 1 tablet by mouth daily.  . carvedilol (COREG) 6.25 MG tablet Take 6.25 mg by mouth 2 (two) times daily with a meal.  . Cholecalciferol (VITAMIN D3) 5000 units TABS Take by mouth.  . Magnesium 250 MG TABS Take 30 mg by mouth daily.  . Multiple Vitamin (MULTIVITAMIN WITH MINERALS) TABS tablet Take 1 tablet by mouth daily.  . simvastatin (ZOCOR) 20 MG tablet Take 20 mg by mouth daily.  . Testosterone (FORTESTA) 10 MG/ACT (2%) GEL Place 40 mg onto the skin daily.   Marland Kitchen topiramate (TOPAMAX) 25 MG tablet Pt takes 12.5 mg daily  . Vilazodone HCl (VIIBRYD) 40 MG TABS Take 40 mg by mouth daily.    No Known Allergies  Social History   Socioeconomic History  . Marital status: Married    Spouse name: None  . Number of children: None  . Years of education: None  . Highest education level: None  Social Needs  . Financial resource strain: None  . Food insecurity - worry: None  . Food insecurity - inability: None  . Transportation needs - medical: None  . Transportation needs - non-medical: None  Occupational History  . None  Tobacco Use  . Smoking status: Former Smoker    Last attempt to quit: 1996    Years since quitting: 22.8  . Smokeless tobacco: Never Used  Substance and Sexual Activity  . Alcohol use: No  . Drug use: No  . Sexual activity: None  Other Topics Concern  . None  Social History Narrative  . None    family history includes  Cancer in his father and mother; Depression in his mother; Diabetes in his father; Heart disease in his father and mother; High Cholesterol in his father and sister; Hypertension in his father and sister.  Wt Readings from Last 3 Encounters:  06/20/17 276 lb 12.8 oz (125.6 kg)  06/05/17 280 lb (127 kg)    PHYSICAL EXAM BP (!) 142/98   Pulse 63   Ht 6' (1.829 m)   Wt 276 lb 12.8 oz (125.6 kg)   BMI 37.54 kg/m  Physical Exam  Constitutional: He is oriented to person, place, and time. He appears well-developed and well-nourished. No distress.  Healthy-appearing  HENT:  Head: Normocephalic and atraumatic.  Mouth/Throat: Oropharynx is clear and moist. No oropharyngeal exudate.  Eyes: EOM are normal. Pupils are equal, round, and  reactive to light. No scleral icterus.  Neck: Normal range of motion. Neck supple. No hepatojugular reflux and no JVD present. Carotid bruit is not present.  Cardiovascular: Normal rate, regular rhythm and intact distal pulses.  No extrasystoles are present. PMI is not displaced. Exam reveals no gallop and no friction rub.  Murmur (soft SEM) heard.  Medium-pitched crescendo-decrescendo early systolic murmur is present with a grade of 1/6 at the upper right sternal border. Pulmonary/Chest: Effort normal and breath sounds normal. No respiratory distress. He has no wheezes. He has no rales.  Abdominal: Soft. Bowel sounds are normal. He exhibits no distension. There is no tenderness. There is no rebound.  Musculoskeletal: Normal range of motion. He exhibits no edema or deformity.  Neurological: He is alert and oriented to person, place, and time. No cranial nerve deficit.  Skin: Skin is warm and dry. No rash noted. No erythema.  Psychiatric: He has a normal mood and affect. His behavior is normal. Judgment and thought content normal.  Nursing note and vitals reviewed.    Adult ECG Report  Rate: 63;  Rhythm: normal sinus rhythm, premature ventricular contractions  (PVC) and indeterminate; left axis deviation (-36  Narrative Interpretation: relatively normal EKG. No ischemic changes.   Other studies Reviewed: Additional studies/ records that were reviewed today include:  Recent Labs:   - just checked by PCP   ASSESSMENT / PLAN: Problem List Items Addressed This Visit    Atypical angina ([place])    He does note some occasional episodes of some twinging or tightness in his chest associated with exertional dyspnea.  Does not happen all the time, and does not last long.  However given the somewhat progression of his exertional dyspnea, we will evaluate with Coronary CTA-CT FFR      Relevant Orders   ECHOCARDIOGRAM COMPLETE   CT CORONARY MORPH W/CTA COR W/SCORE W/CA W/CM &/OR WO/CM   CT CORONARY FRACTIONAL FLOW RESERVE DATA PREP   CT CORONARY FRACTIONAL FLOW RESERVE FLUID ANALYSIS   Chest discomfort associated with dyspnea    Chest discomfort that he feels is somewhat atypical from a cardiac standpoint, but warrants evaluation for coronary ischemia.  We are checking a coronary CTA.      Relevant Orders   EKG 12-Lead (Completed)   ECHOCARDIOGRAM COMPLETE   CT CORONARY MORPH W/CTA COR W/SCORE W/CA W/CM &/OR WO/CM   CT CORONARY FRACTIONAL FLOW RESERVE DATA PREP   CT CORONARY FRACTIONAL FLOW RESERVE FLUID ANALYSIS   Essential hypertension (Chronic)    Hypertensive today, I would like to see what is blood pressure looks like and follow-up visits.  He does note that his blood pressure has been up lately and we may need to adjust medications.  He was switched from lisinopril to losartan HCTZ by his PCP He shows me that his blood pressure has ranged from 124/76 up to 150/82 over the last several days. Pending the results of his echo and coronary CTA, we may want to increase the ARB.      Relevant Orders   EKG 12-Lead (Completed)   ECHOCARDIOGRAM COMPLETE   CT CORONARY MORPH W/CTA COR W/SCORE W/CA W/CM &/OR WO/CM   CT CORONARY FRACTIONAL FLOW RESERVE  DATA PREP   CT CORONARY FRACTIONAL FLOW RESERVE FLUID ANALYSIS   Exertional dyspnea - Primary (Chronic)    He does have exertional dyspnea which although he did initially discuss, seems to be the major issue for him.  He is concerned because of his history of cardiomyopathy  that he now is starting to notice getting more short of breath than usual.  He has not had any kind of ischemic evaluation in quite some time, and the last echocardiogram was 2 years ago.  In order to get a new baseline for him we will recheck an echocardiogram to evaluate his dyspnea and the murmur. To get a baseline coronary artery anatomy assessment we will check coronary CTA with possible CT FFR. Pending results of the CTA, we may want to consider just-doing a GXT to evaluate for chronotropic incompetence with a beta-blocker      Relevant Orders   EKG 12-Lead (Completed)   ECHOCARDIOGRAM COMPLETE   CT CORONARY MORPH W/CTA COR W/SCORE W/CA W/CM &/OR WO/CM   CT CORONARY FRACTIONAL FLOW RESERVE DATA PREP   CT CORONARY FRACTIONAL FLOW RESERVE FLUID ANALYSIS   History of cardiomyopathy (Chronic)    With him now having exertional dyspnea, we will check a 2D echo just to confirm normal EF. We will also try to get the results of his echo 2016.      Relevant Orders   ECHOCARDIOGRAM COMPLETE   CT CORONARY MORPH W/CTA COR W/SCORE W/CA W/CM &/OR WO/CM   CT CORONARY FRACTIONAL FLOW RESERVE DATA PREP   CT CORONARY FRACTIONAL FLOW RESERVE FLUID ANALYSIS   Vasovagal syncope    History of positive tilt table test.  Would avoid dehydration as well as over aggressive treatment of hypertension.      Relevant Orders   EKG 12-Lead (Completed)   ECHOCARDIOGRAM COMPLETE   CT CORONARY MORPH W/CTA COR W/SCORE W/CA W/CM &/OR WO/CM   CT CORONARY FRACTIONAL FLOW RESERVE DATA PREP   CT CORONARY FRACTIONAL FLOW RESERVE FLUID ANALYSIS      Current medicines are reviewed at length with the patient today. (+/- concerns) n/a The  following changes have been made: n/a  Patient Instructions  Medication Instructions:   NO CHANGE  Testing/Procedures:  Your physician has requested that you have an echocardiogram. Echocardiography is a painless test that uses sound waves to create images of your heart. It provides your doctor with information about the size and shape of your heart and how well your heart's chambers and valves are working. This procedure takes approximately one hour. There are no restrictions for this procedure.    Follow-Up:  Your physician recommends that you schedule a follow-up appointment in: 1-2 MONTHS WITH DR [facility]     Please arrive at the [facility] main entrance of [facility] at xx:xx AM (30-45 minutes prior to test start time)  [facility] [place], [place] [address] [phone]  Proceed to the [facility] Radiology Department (First Floor).  Please follow these instructions carefully (unless otherwise directed):  Hold all erectile dysfunction medications at least 48 hours prior to test.  On the Night Before the Test: . Drink plenty of water. . Do not consume any caffeinated/decaffeinated beverages or chocolate 12 hours prior to your test. . Do not take any antihistamines 12 hours prior to your test. . If you take Metformin do not take 24 hours prior to test. . If the patient has contrast allergy: ? Patient will need a prescription for Prednisone and very clear instructions (as follows): 1. Prednisone 50 mg - take 13 hours prior to test 2. Take another Prednisone 50 mg 7 hours prior to test 3. Take another Prednisone 50 mg 1 hour prior to test 4. Take Benadryl 50 mg 1 hour prior to test .  Patient must complete all four doses of above prophylactic medications. . Patient will need a ride after test due to Benadryl.  On the Day of the Test: . Drink plenty of water. Do not drink any water within one hour of the test. . Do not eat any food 4  hours prior to the test. . You may take your regular medications prior to the test. . IF NOT ON A BETA BLOCKER - Take 50 mg of lopressor (metoprolol) one hour before the test. . HOLD Furosemide morning of the test.  After the Test: . Drink plenty of water. . After receiving IV contrast, you may experience a mild flushed feeling. This is normal. . On occasion, you may experience a mild rash up to 24 hours after the test. This is not dangerous. If this occurs, you can take Benadryl 25 mg and increase your fluid intake. . If you experience trouble breathing, this can be serious. If it is severe call 911 IMMEDIATELY. If it is mild, please call our office. . If you take any of these medications: Glipizide/Metformin, Avandament, Glucavance, please do not take 48 hours after completing test.     Studies Ordered:   Orders Placed This Encounter  Procedures  . CT CORONARY MORPH W/CTA COR W/SCORE W/CA W/CM &/OR WO/CM  . CT CORONARY FRACTIONAL FLOW RESERVE DATA PREP  . CT CORONARY FRACTIONAL FLOW RESERVE FLUID ANALYSIS  . EKG 12-Lead  . ECHOCARDIOGRAM COMPLETE      Glenetta Hew, M.D., M.S. Interventional Cardiologist   Pager # [phone] Phone # [phone] [address] [place], [place] [address]

## 2017-06-24 ENCOUNTER — Encounter: Payer: Self-pay | Admitting: Cardiology

## 2017-06-24 ENCOUNTER — Telehealth: Payer: Self-pay | Admitting: *Deleted

## 2017-06-24 DIAGNOSIS — I208 Other forms of angina pectoris: Secondary | ICD-10-CM | POA: Insufficient documentation

## 2017-06-24 NOTE — Telephone Encounter (Addendum)
Medical records received from [facility] and [facility](echocardiogram) for patients upcoming appointment on 08/25/17 @0900  with Dr.Harding . Medical records given to [facility]

## 2017-06-24 NOTE — Assessment & Plan Note (Signed)
Hypertensive today, I would like to see what is blood pressure looks like and follow-up visits.  He does note that his blood pressure has been up lately and we may need to adjust medications.  He was switched from lisinopril to losartan HCTZ by his PCP He shows me that his blood pressure has ranged from 124/76 up to 150/82 over the last several days. Pending the results of his echo and coronary CTA, we may want to increase the ARB.

## 2017-06-24 NOTE — Assessment & Plan Note (Signed)
History of positive tilt table test.  Would avoid dehydration as well as over aggressive treatment of hypertension.

## 2017-06-24 NOTE — Assessment & Plan Note (Signed)
With him now having exertional dyspnea, we will check a 2D echo just to confirm normal EF. We will also try to get the results of his echo 2016.

## 2017-06-24 NOTE — Assessment & Plan Note (Signed)
He does note some occasional episodes of some twinging or tightness in his chest associated with exertional dyspnea.  Does not happen all the time, and does not last long.  However given the somewhat progression of his exertional dyspnea, we will evaluate with Coronary CTA-CT FFR

## 2017-06-24 NOTE — Telephone Encounter (Signed)
Medical records requested from Dr. Kai Levins. Requested faxed 10.06.18

## 2017-06-24 NOTE — Assessment & Plan Note (Addendum)
He does have exertional dyspnea which although he did initially discuss, seems to be the major issue for him.  He is concerned because of his history of cardiomyopathy that he now is starting to notice getting more short of breath than usual.  He has not had any kind of ischemic evaluation in quite some time, and the last echocardiogram was 2 years ago.  In order to get a new baseline for him we will recheck an echocardiogram to evaluate his dyspnea and the murmur. To get a baseline coronary artery anatomy assessment we will check coronary CTA with possible CT FFR. Pending results of the CTA, we may want to consider just-doing a GXT to evaluate for chronotropic incompetence with a beta-blocker

## 2017-06-24 NOTE — Assessment & Plan Note (Signed)
Chest discomfort that he feels is somewhat atypical from a cardiac standpoint, but warrants evaluation for coronary ischemia.  We are checking a coronary CTA.

## 2017-06-25 ENCOUNTER — Other Ambulatory Visit: Payer: Self-pay | Admitting: *Deleted

## 2017-06-25 DIAGNOSIS — I10 Essential (primary) hypertension: Secondary | ICD-10-CM

## 2017-06-25 DIAGNOSIS — R0789 Other chest pain: Secondary | ICD-10-CM

## 2017-06-26 ENCOUNTER — Ambulatory Visit (HOSPITAL_COMMUNITY): Payer: Medicare Other | Attending: Cardiology

## 2017-06-26 ENCOUNTER — Other Ambulatory Visit: Payer: Medicare Other | Admitting: *Deleted

## 2017-06-26 ENCOUNTER — Other Ambulatory Visit: Payer: Self-pay

## 2017-06-26 DIAGNOSIS — R0789 Other chest pain: Secondary | ICD-10-CM

## 2017-06-26 DIAGNOSIS — I2089 Other forms of angina pectoris: Secondary | ICD-10-CM

## 2017-06-26 DIAGNOSIS — I10 Essential (primary) hypertension: Secondary | ICD-10-CM | POA: Diagnosis not present

## 2017-06-26 DIAGNOSIS — I059 Rheumatic mitral valve disease, unspecified: Secondary | ICD-10-CM | POA: Diagnosis not present

## 2017-06-26 DIAGNOSIS — R0609 Other forms of dyspnea: Secondary | ICD-10-CM | POA: Diagnosis not present

## 2017-06-26 DIAGNOSIS — E785 Hyperlipidemia, unspecified: Secondary | ICD-10-CM | POA: Insufficient documentation

## 2017-06-26 DIAGNOSIS — Z8679 Personal history of other diseases of the circulatory system: Secondary | ICD-10-CM | POA: Diagnosis not present

## 2017-06-26 DIAGNOSIS — I208 Other forms of angina pectoris: Secondary | ICD-10-CM | POA: Insufficient documentation

## 2017-06-26 DIAGNOSIS — I119 Hypertensive heart disease without heart failure: Secondary | ICD-10-CM | POA: Insufficient documentation

## 2017-06-26 DIAGNOSIS — I429 Cardiomyopathy, unspecified: Secondary | ICD-10-CM | POA: Insufficient documentation

## 2017-06-26 DIAGNOSIS — G4733 Obstructive sleep apnea (adult) (pediatric): Secondary | ICD-10-CM | POA: Diagnosis not present

## 2017-06-26 DIAGNOSIS — R0600 Dyspnea, unspecified: Secondary | ICD-10-CM

## 2017-06-26 DIAGNOSIS — R55 Syncope and collapse: Secondary | ICD-10-CM

## 2017-06-26 LAB — BASIC METABOLIC PANEL
BUN/Creatinine Ratio: 15 (ref 10–24)
BUN: 16 mg/dL (ref 8–27)
CO2: 27 mmol/L (ref 20–29)
Calcium: 9.3 mg/dL (ref 8.6–10.2)
Chloride: 105 mmol/L (ref 96–106)
Creatinine, Ser: 1.08 mg/dL (ref 0.76–1.27)
GFR calc Af Amer: 82 mL/min/{1.73_m2} (ref >59)
GFR calc non Af Amer: 71 mL/min/{1.73_m2} (ref >59)
Glucose: 107 mg/dL — ABNORMAL HIGH (ref 65–99)
Potassium: 4.1 mmol/L (ref 3.5–5.2)
Sodium: 145 mmol/L — ABNORMAL HIGH (ref 134–144)

## 2017-07-01 DIAGNOSIS — R42 Dizziness and giddiness: Secondary | ICD-10-CM | POA: Diagnosis not present

## 2017-07-01 DIAGNOSIS — Z6837 Body mass index (BMI) 37.0-37.9, adult: Secondary | ICD-10-CM | POA: Diagnosis not present

## 2017-07-01 DIAGNOSIS — R51 Headache: Secondary | ICD-10-CM | POA: Diagnosis not present

## 2017-07-01 DIAGNOSIS — G4733 Obstructive sleep apnea (adult) (pediatric): Secondary | ICD-10-CM | POA: Diagnosis not present

## 2017-07-01 DIAGNOSIS — R0789 Other chest pain: Secondary | ICD-10-CM | POA: Diagnosis not present

## 2017-07-01 DIAGNOSIS — I10 Essential (primary) hypertension: Secondary | ICD-10-CM | POA: Diagnosis not present

## 2017-07-04 ENCOUNTER — Telehealth: Payer: Self-pay | Admitting: *Deleted

## 2017-07-04 NOTE — Telephone Encounter (Signed)
Spoke to wife . Result given per dpi.  Patient able to come to phone. Verbalized understanding.

## 2017-07-04 NOTE — Telephone Encounter (Signed)
-----   Message from Leonie Man, MD sent at 07/01/2017  6:06 PM EST ----- Echocardiogram results: Basically normal echo Normal left ventricle size with normal function (ejection fraction, EF 60-65% -normal range is 50-65%).  Mild relaxation abnormality/diastolic dysfunction -normal for age. Normal right ventricle size and systolic function.  No significant valvular abnormalities.  No noticeable aortic stenosis or regurgitation.  Just mild calcification of the leaflets (sclerosis).  Glenetta Hew, MD

## 2017-07-14 ENCOUNTER — Ambulatory Visit (HOSPITAL_COMMUNITY)
Admission: RE | Admit: 2017-07-14 | Discharge: 2017-07-14 | Disposition: A | Discharge status: Home / Self Care | Payer: Medicare Other | Source: Ambulatory Visit | Attending: Cardiology | Admitting: Cardiology

## 2017-07-14 ENCOUNTER — Other Ambulatory Visit: Payer: Self-pay | Admitting: Cardiology

## 2017-07-14 ENCOUNTER — Ambulatory Visit (HOSPITAL_COMMUNITY): Payer: Medicare Other

## 2017-07-14 DIAGNOSIS — R55 Syncope and collapse: Secondary | ICD-10-CM

## 2017-07-14 DIAGNOSIS — R0789 Other chest pain: Secondary | ICD-10-CM

## 2017-07-14 DIAGNOSIS — I10 Essential (primary) hypertension: Secondary | ICD-10-CM

## 2017-07-14 DIAGNOSIS — I208 Other forms of angina pectoris: Secondary | ICD-10-CM | POA: Diagnosis not present

## 2017-07-14 DIAGNOSIS — Z8679 Personal history of other diseases of the circulatory system: Secondary | ICD-10-CM

## 2017-07-14 DIAGNOSIS — K760 Fatty (change of) liver, not elsewhere classified: Secondary | ICD-10-CM | POA: Diagnosis not present

## 2017-07-14 DIAGNOSIS — I2089 Other forms of angina pectoris: Secondary | ICD-10-CM

## 2017-07-14 DIAGNOSIS — R0600 Dyspnea, unspecified: Secondary | ICD-10-CM

## 2017-07-14 DIAGNOSIS — R0609 Other forms of dyspnea: Secondary | ICD-10-CM

## 2017-07-14 DIAGNOSIS — R52 Pain, unspecified: Secondary | ICD-10-CM

## 2017-07-14 IMAGING — CT CT HEART SCORING
1 series · 16 of 20 positions shown, 20 images · non-contrast
Comparison: None.

EXAM:
OVER-READ INTERPRETATION  CT CHEST

The following report is an over-read performed by radiologist Dr.
over-read does not include interpretation of cardiac or coronary
anatomy or pathology. The coronary calcium score interpretation by
the cardiologist is attached.

[Series 3: ds_cascseq 3.0 qr36 70% · axial · 0.42mm/px · z∈[+1122,+1252]mm · 16 of 98 slices shown, 20 images]
[im 6/98  vessel]
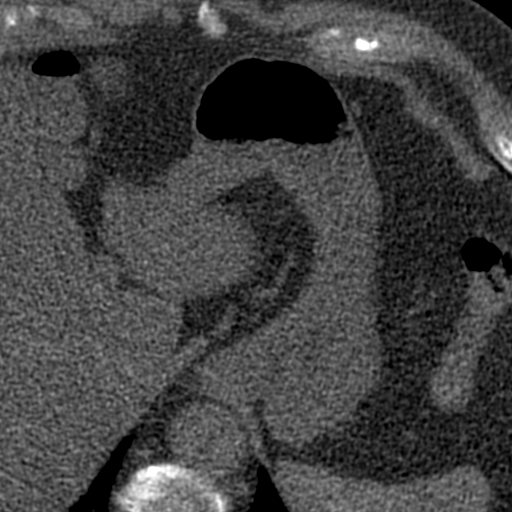
[im 6/98  lung]
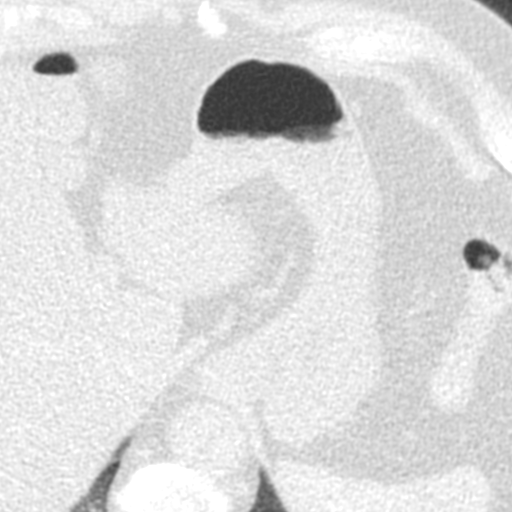
[im 11/98  vessel]
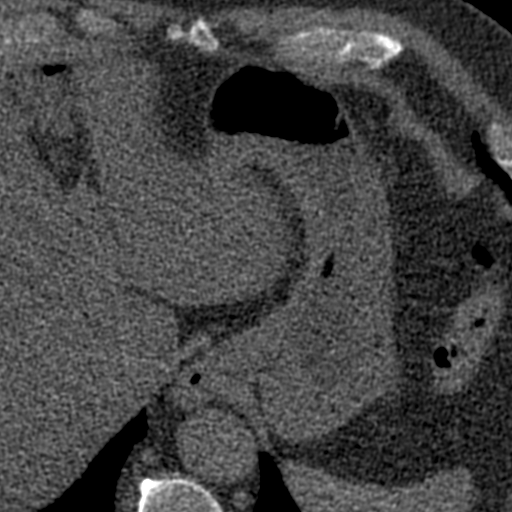
[im 16/98  vessel]
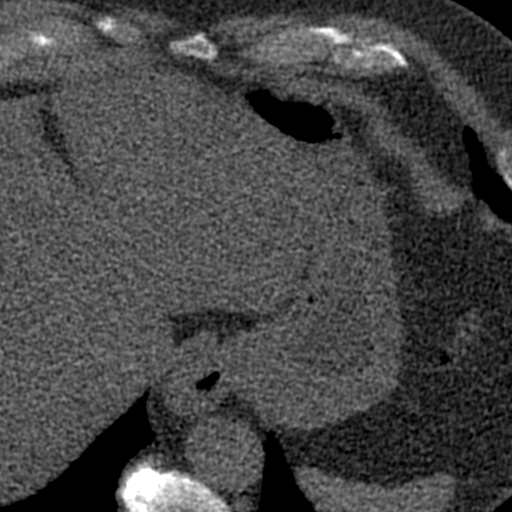
[im 21/98  vessel]
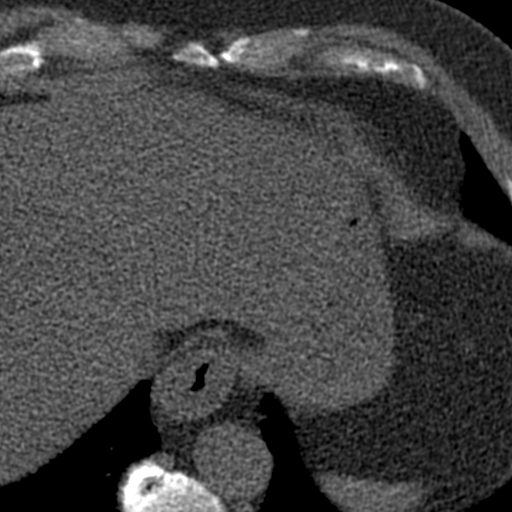
[im 31/98  vessel]
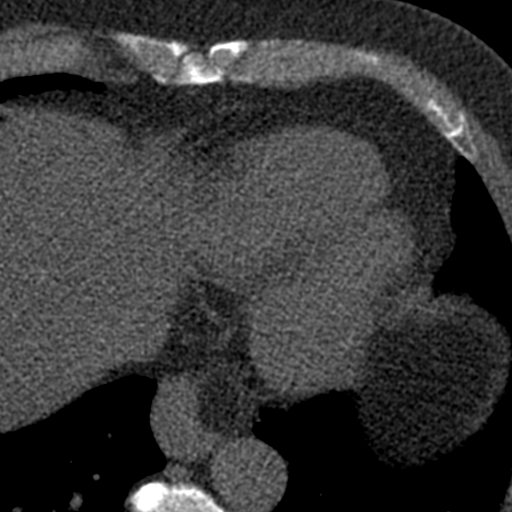
[im 31/98  lung]
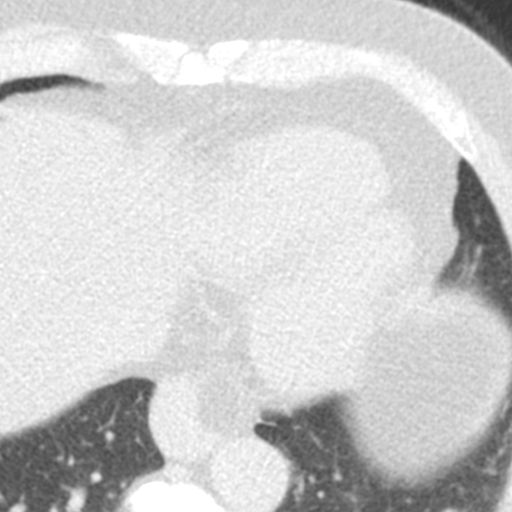
[im 36/98  vessel]
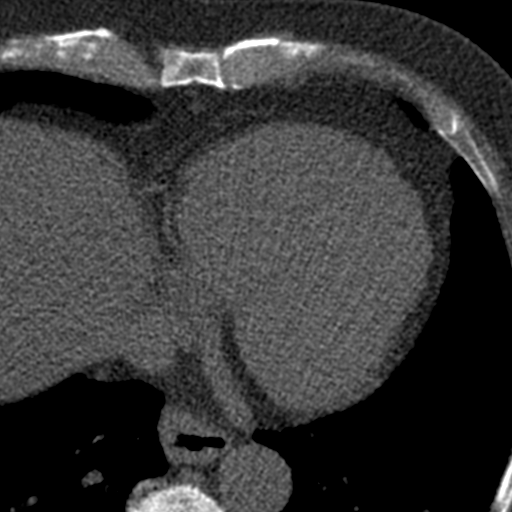
[im 41/98  vessel]
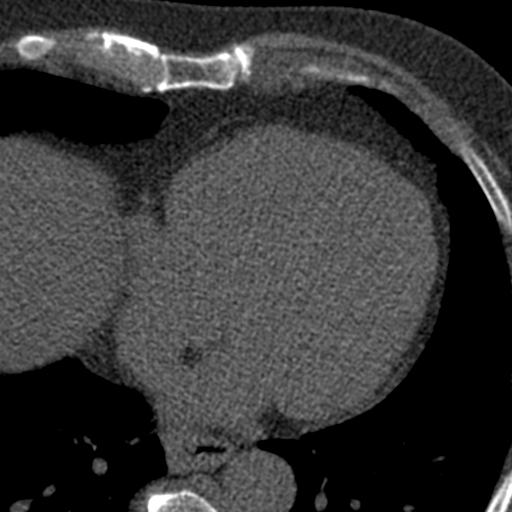
[im 46/98  vessel]
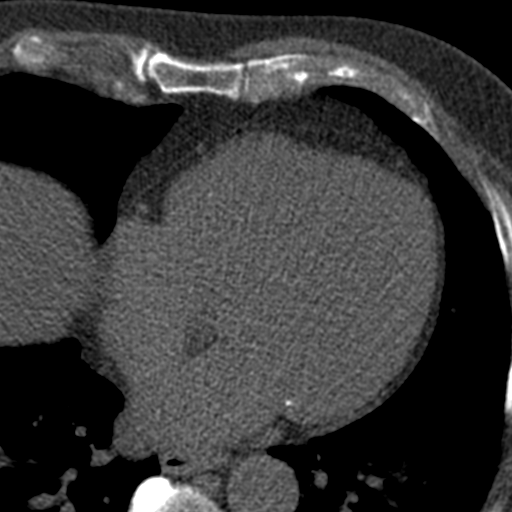
[im 52/98  vessel]
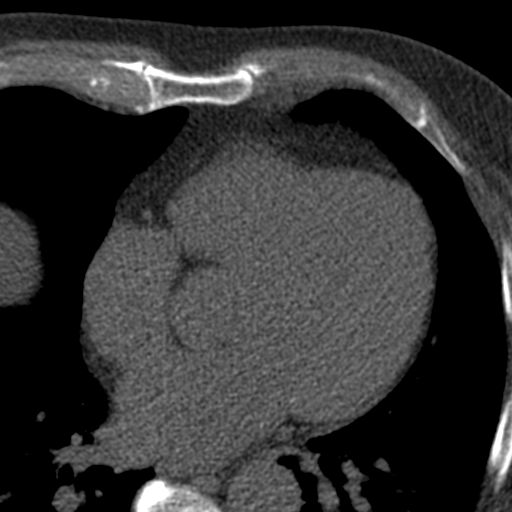
[im 52/98  lung]
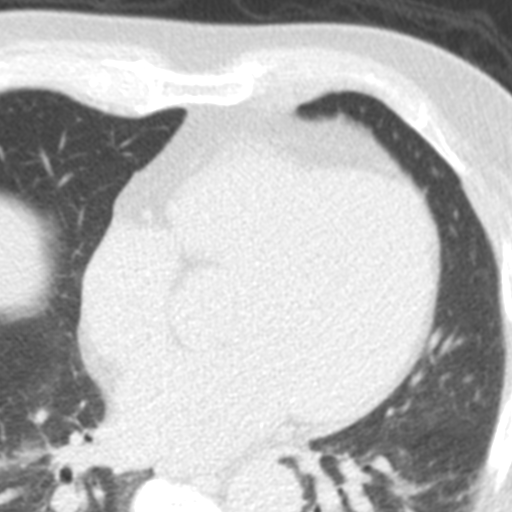
[im 57/98  vessel]
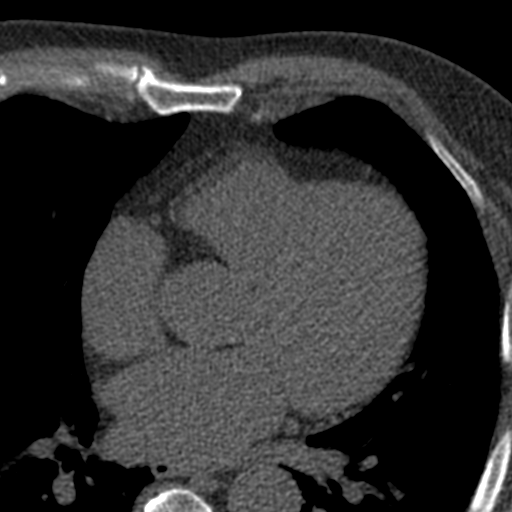
[im 62/98  vessel]
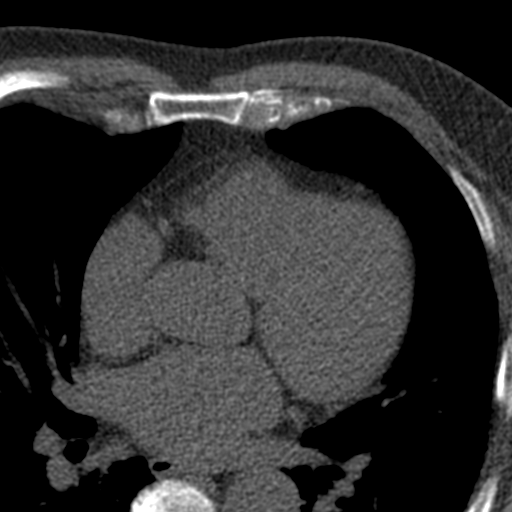
[im 67/98  vessel]
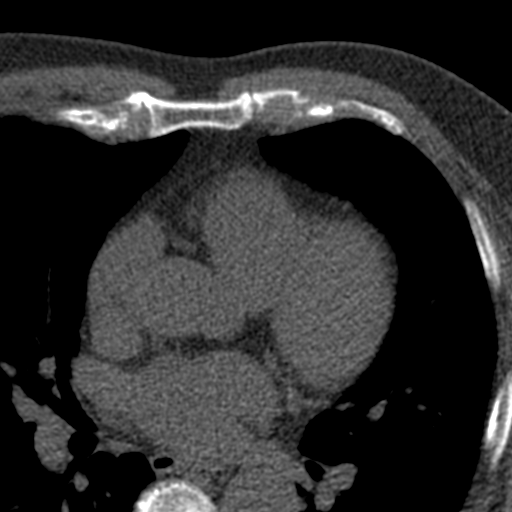
[im 77/98  vessel]
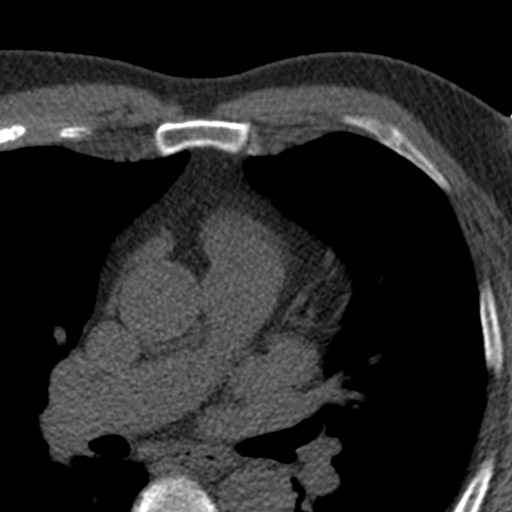
[im 77/98  lung]
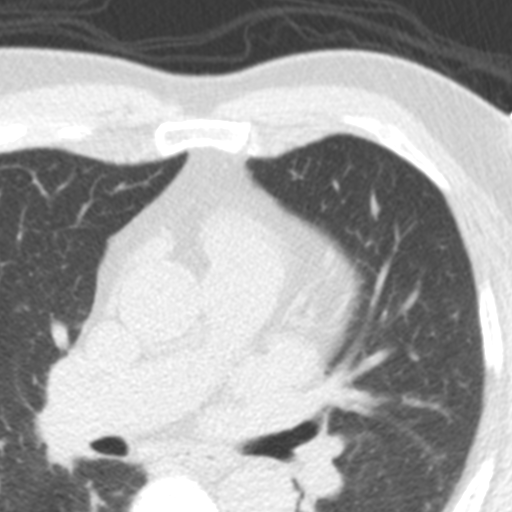
[im 82/98  vessel]
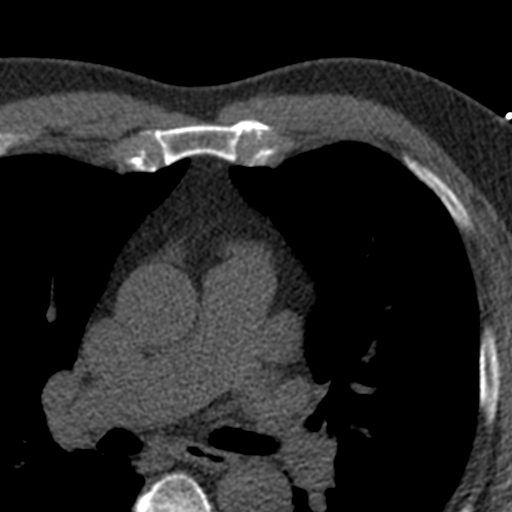
[im 87/98  vessel]
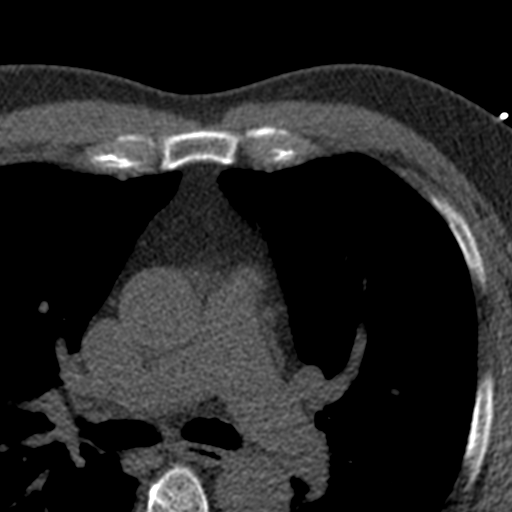
[im 92/98  vessel]
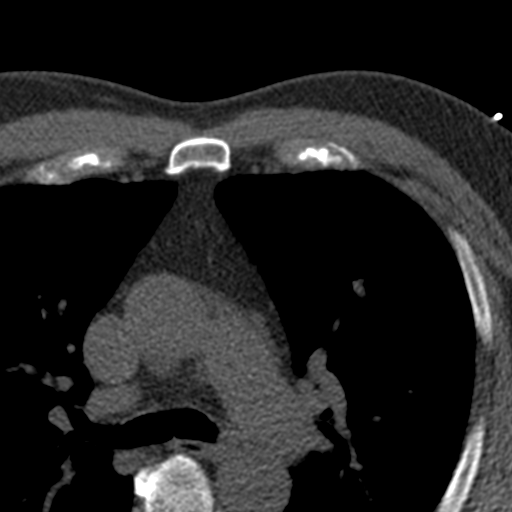

[16 of 20 positions shown; findings below may reference images not displayed]

FINDINGS: Within the visualized portions of the thorax there are no suspicious
appearing pulmonary nodules or masses, there is no acute
consolidative airspace disease, no pleural effusions, no
pneumothorax and no lymphadenopathy. Visualized portions of the
upper abdomen demonstrate mild diffuse low attenuation throughout
the hepatic parenchyma, indicative of hepatic steatosis. There are
no aggressive appearing lytic or blastic lesions noted in the
visualized portions of the skeleton.
IMPRESSION: 1. Mild hepatic steatosis.

## 2017-07-14 MED ORDER — IOPAMIDOL (ISOVUE-370) INJECTION 76%
INTRAVENOUS | Status: AC
  Filled 2017-07-14: qty 100

## 2017-07-14 MED ORDER — NITROGLYCERIN 0.4 MG SL SUBL
SUBLINGUAL_TABLET | SUBLINGUAL | Status: AC
  Administered 2017-07-14: 0.8 mg
  Filled 2017-07-14: qty 1

## 2017-07-14 MED ORDER — METOPROLOL TARTRATE 5 MG/5ML IV SOLN
INTRAVENOUS | Status: AC
  Administered 2017-07-14: 10:00:00
  Filled 2017-07-14: qty 5

## 2017-07-14 NOTE — Progress Notes (Signed)
Pt was having Ct heart scan, once contrast injected, pt c/o L arm from elbow down feeling like it would "explode". Said pressure was really bad and fingers tingling.Pt was also holding his  hand as though it was curled and stiff.  Rad MD to room to assess pt. Blood flow appears WNL and no swelling noted at site. IV does not show evidence of infiltration. Md advises RN to watch pt for 2 hours and watch for any signs, symptoms. CT heart test will be rescheduled. VS stable. Ice packs applied to L arm and hand.

## 2017-07-14 NOTE — Progress Notes (Signed)
Pt recovering at RN station. Pt states slight tingling in L hand but overall feels better. No more "pressure" in L arm. BP 105/78, HR 67. Drinking coffee and having snack. Wife with him.

## 2017-07-14 NOTE — Progress Notes (Signed)
Pt came for a CTA Coronary scan. Pts IV was placed by the radiology nurses. IV flushed fine, by hand, and by injector. Pt developed severe pain from  his elbow to his hand, with contraction of LEFT hand. Dr Weber Cooks saw pt., explained situation, and examined LEFT arm and hand. Pt was asked to remain in xray for 2 hours for observation.

## 2017-07-19 HISTORY — PX: OTHER SURGICAL HISTORY: SHX169

## 2017-07-21 NOTE — Progress Notes (Signed)
Technologist called and left message for patient to call department to follow up.

## 2017-07-21 NOTE — Progress Notes (Signed)
Mr. Westbrooks called back he is doing fine. His arm is back to normal.

## 2017-07-25 ENCOUNTER — Ambulatory Visit (HOSPITAL_COMMUNITY)
Admission: RE | Admit: 2017-07-25 | Discharge: 2017-07-25 | Disposition: A | Discharge status: Home / Self Care | Payer: Medicare Other | Source: Ambulatory Visit | Attending: Cardiology | Admitting: Cardiology

## 2017-07-25 ENCOUNTER — Other Ambulatory Visit: Payer: Self-pay | Admitting: Cardiology

## 2017-07-25 ENCOUNTER — Encounter (HOSPITAL_COMMUNITY): Payer: Self-pay

## 2017-07-25 ENCOUNTER — Encounter (HOSPITAL_COMMUNITY): Payer: Self-pay | Admitting: Interventional Radiology

## 2017-07-25 DIAGNOSIS — I2510 Atherosclerotic heart disease of native coronary artery without angina pectoris: Secondary | ICD-10-CM | POA: Insufficient documentation

## 2017-07-25 DIAGNOSIS — I878 Other specified disorders of veins: Secondary | ICD-10-CM | POA: Insufficient documentation

## 2017-07-25 DIAGNOSIS — R079 Chest pain, unspecified: Secondary | ICD-10-CM | POA: Diagnosis not present

## 2017-07-25 DIAGNOSIS — R0609 Other forms of dyspnea: Principal | ICD-10-CM

## 2017-07-25 DIAGNOSIS — R52 Pain, unspecified: Secondary | ICD-10-CM

## 2017-07-25 DIAGNOSIS — Z8679 Personal history of other diseases of the circulatory system: Secondary | ICD-10-CM

## 2017-07-25 DIAGNOSIS — I428 Other cardiomyopathies: Secondary | ICD-10-CM

## 2017-07-25 DIAGNOSIS — R55 Syncope and collapse: Secondary | ICD-10-CM

## 2017-07-25 DIAGNOSIS — K449 Diaphragmatic hernia without obstruction or gangrene: Secondary | ICD-10-CM | POA: Insufficient documentation

## 2017-07-25 DIAGNOSIS — I872 Venous insufficiency (chronic) (peripheral): Secondary | ICD-10-CM | POA: Diagnosis not present

## 2017-07-25 DIAGNOSIS — K760 Fatty (change of) liver, not elsewhere classified: Secondary | ICD-10-CM | POA: Diagnosis not present

## 2017-07-25 DIAGNOSIS — R0600 Dyspnea, unspecified: Secondary | ICD-10-CM

## 2017-07-25 DIAGNOSIS — I10 Essential (primary) hypertension: Secondary | ICD-10-CM

## 2017-07-25 DIAGNOSIS — R0789 Other chest pain: Secondary | ICD-10-CM

## 2017-07-25 DIAGNOSIS — I208 Other forms of angina pectoris: Secondary | ICD-10-CM

## 2017-07-25 HISTORY — PX: IR US GUIDE VASC ACCESS RIGHT: IMG2390

## 2017-07-25 HISTORY — PX: IR RADIOLOGY PERIPHERAL GUIDED IV START: IMG5598

## 2017-07-25 IMAGING — CT CT HEART MORP W/ CTA COR W/ SCORE W/ CA W/CM &/OR W/O CM
4 of 6 series · 10 of 20 positions shown, 11 images · IV contrast (APPLIED)
Comparison: [DATE]

CLINICAL DATA: Chest pain

EXAM:
Cardiac CTA
MEDICATIONS:
Sub lingual nitro. 4mg and lopressor 5mg
TECHNIQUE: The patient was scanned on a Siemens [REDACTED]ice scanner. Gantry
rotation speed was 250 msecs. Collimation was .9mm. A 100 kV
prospective scan was triggered in the ascending thoracic aorta at
140 HU's with 5% padding centered around 78% of the R-R interval.
Average HR during the scan was 50 bpm. The 3D data set was
interpreted on a dedicated work station using MPR, MIP and VRT
modes. A total of 80cc of contrast was used.

[Series 6: best diast 71 % · axial · 0.45mm/px · z∈[+1000,+1056]mm · 2 of 422 slices shown, 3 images]
[im 141/422  vessel]
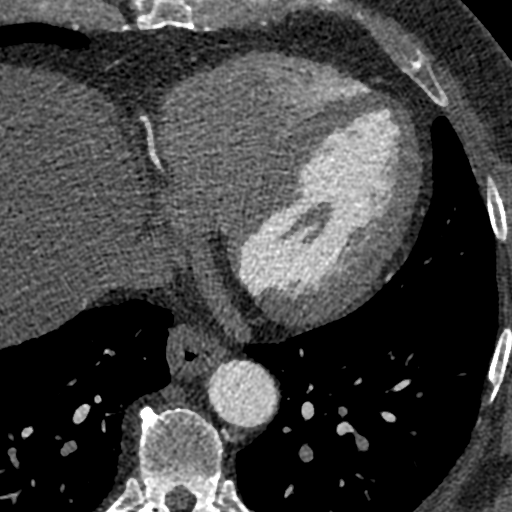
[im 141/422  lung]
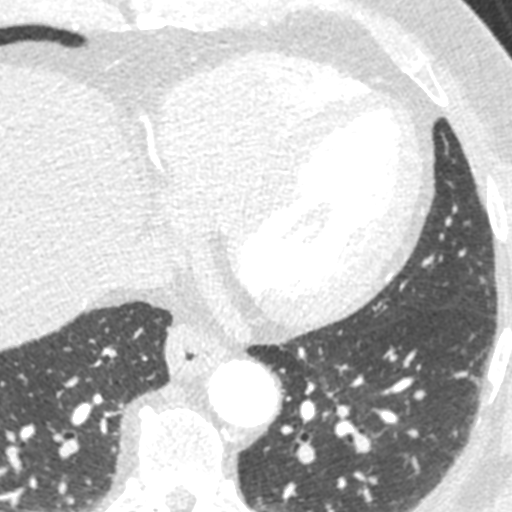
[im 281/422  vessel]
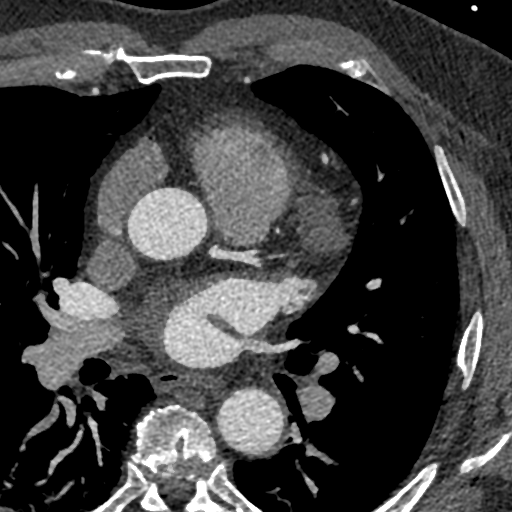

[Series 7: best syst 39 % · axial · 0.45mm/px · z∈[+1000,+1056]mm · 2 of 422 slices shown]
[im 141/422  vessel]
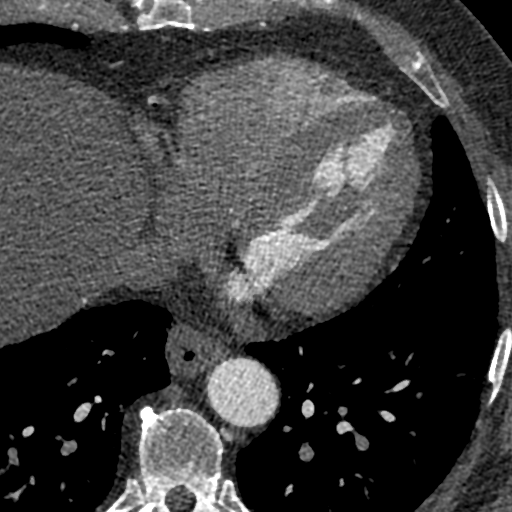
[im 281/422  vessel]
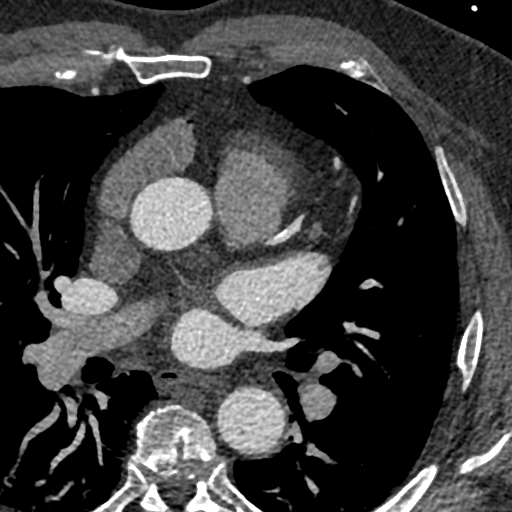

[Series 8: ts diast sharp 71 % · axial · 0.45mm/px · z∈[+986,+1070]mm · 3 of 422 slices shown]
[im 106/422  lung]
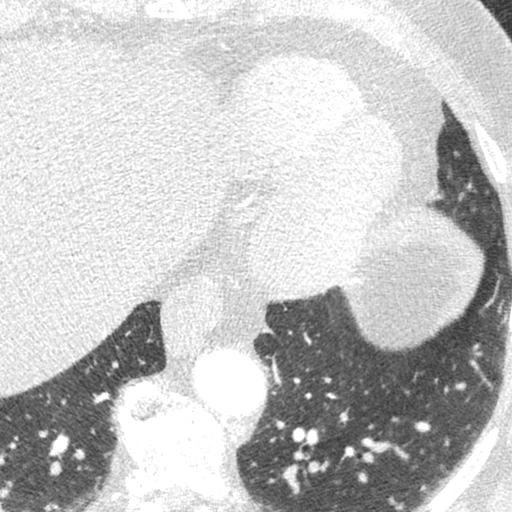
[im 211/422  lung]
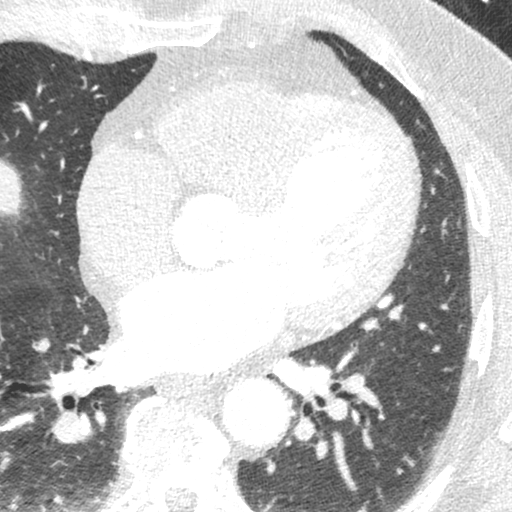
[im 316/422  lung]
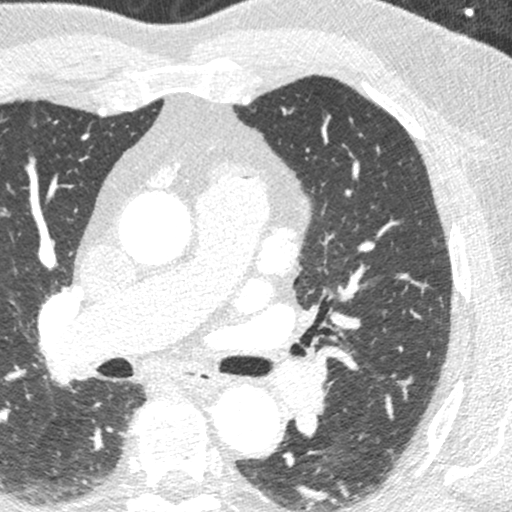

[Series 9: ts syst sharp 39 % · axial · 0.45mm/px · z∈[+986,+1070]mm · 3 of 422 slices shown]
[im 106/422  lung]
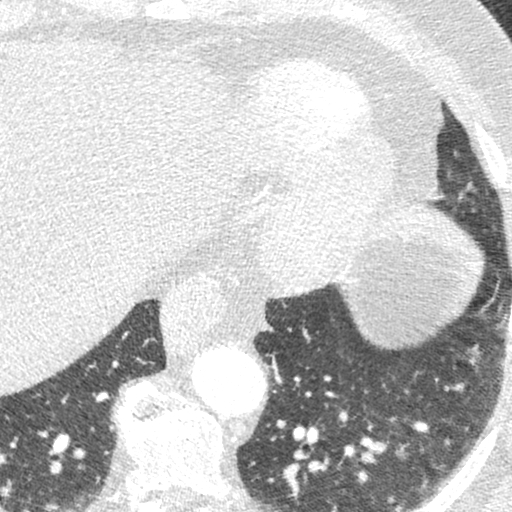
[im 211/422  lung]
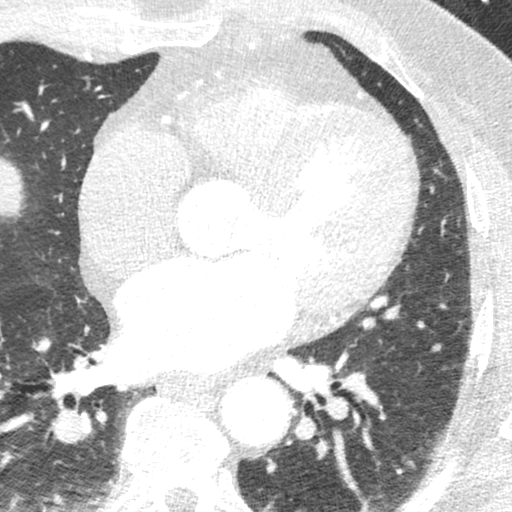
[im 316/422  lung]
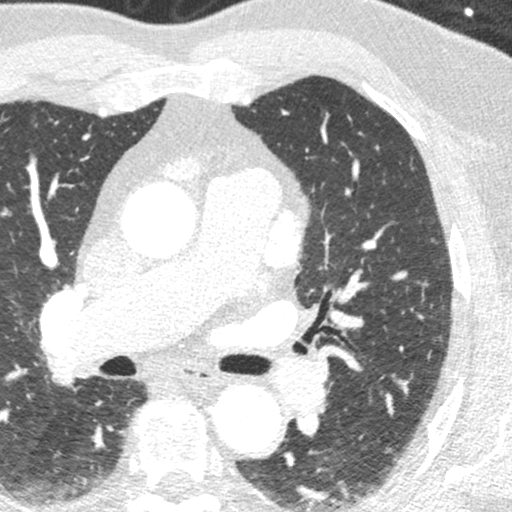

[10 of 20 positions shown; findings below may reference images not displayed]

FINDINGS: Non-cardiac: See separate report from [REDACTED]. No
significant findings on limited lung and soft tissue windows.

Calcium Score: Some calcium noted in proximal LAD but did not meet
density criteria to be registered

Coronary Arteries: Right dominant with no anomalies

LM:  Normal

LAD:  Less than 30% mixed plaque in proximal LAD

D1:  Normal

D2:  Normal

Circumflex:  Normal

OM1:  Normal

OM2:  Normal

RCA:  Dominant and normal

PDA:  Normal

PLA:  Normal
IMPRESSION: 1) Calcium score 0 although some noted in proximal LAD that did not
have density to register in calculation

2) Essentially normal right dominant coronary arteries with less
than 30% mixed plaque in proximal LAD

3) Normal aortic root 3.2 cm

RL

EXAM:
OVER-READ INTERPRETATION  CT CHEST

The following report is an over-read performed by radiologist Dr.
RL [REDACTED] on [DATE]. This over-read
does not include interpretation of cardiac or coronary anatomy or
pathology. The coronary CTA interpretation by the cardiologist is
attached.
FINDINGS: Vascular: Aorta is normal caliber.  Heart is normal size.

Mediastinum/Nodes: Small hiatal hernia. No adenopathy in the lower
mediastinum or hila.

Lungs/Pleura: Visualized lungs clear.  No effusions.

Upper Abdomen: Fatty infiltration of the liver.  No acute findings.

Musculoskeletal: Chest wall soft tissues are unremarkable. No acute
bony abnormality.
IMPRESSION: Small hiatal hernia.

Fatty infiltration of the liver.

No acute findings.

## 2017-07-25 IMAGING — US IR US GUIDE VASC ACCESS RIGHT
1 series · 1 of 1 positions shown · non-contrast
Comparison: none

INDICATION: Patient with poor venous access in need of IV start prior to CT
scan.
CLINICAL DATA: Patient with poor venous access in need of IV start
prior to CT scan.
TECHNIQUE: The right upper arm was prepped with chlorhexidine in a sterile
fashion, and a sterile drape was applied covering the operative
field. Local anesthesia was provided with 1% lidocaine.

[Series 1: ir (id) (id)/(id)/(id) · 1 of 1 slices shown]
[im 1/1]
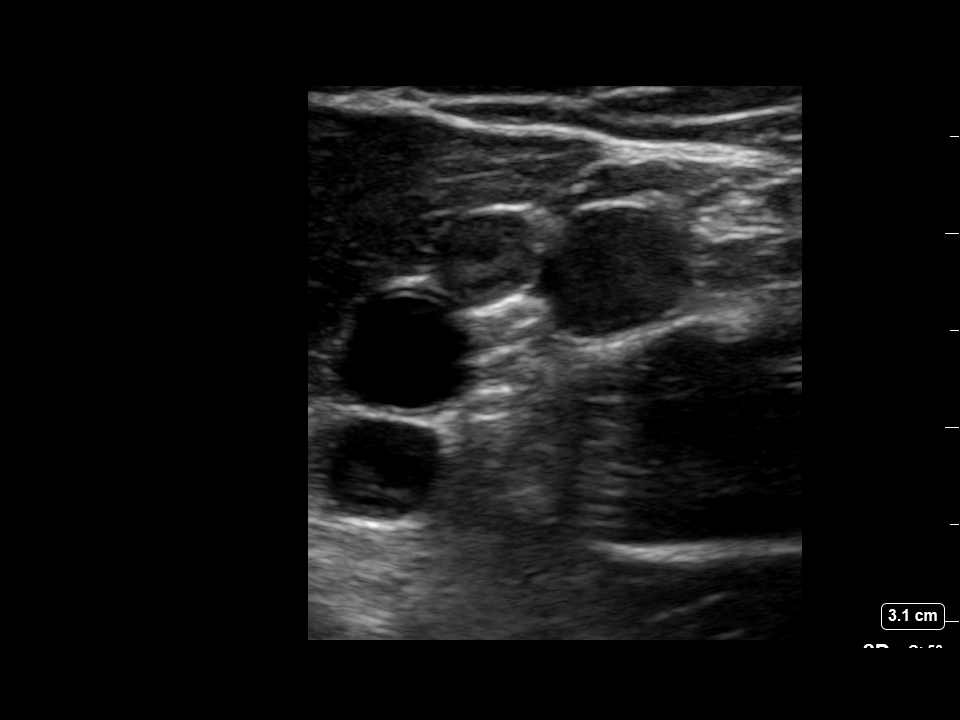

[1 of 1 positions shown; findings below may reference images not displayed]

EXAM:
ULTRASOUND GUIDED VENIPUNCTURE BY FUMIYA

MEDICATIONS:
None

ANESTHESIA/SEDATION:
None

FLUOROSCOPY TIME:  None

CONTRAST:  None

COMPLICATIONS:
None immediate

PROCEDURE:
Informed written consent was obtained from the patient after a
thorough discussion of the procedural risks, benefits and
alternatives. All questions were addressed. Maximal Sterile Barrier
Technique was utilized including caps, mask, sterile gowns, sterile
gloves, sterile drape, hand hygiene and skin antiseptic. A timeout
was performed prior to the initiation of the procedure.
Under direct ultrasound guidance, the right basilic vein was
accessed with a micropuncture kit after the overlying soft tissues
were anesthetized with 1% lidocaine. An ultrasound image was saved
for documentation purposes. The micropuncture sheath easily
aspirated and flushed and was secured in place. A dressing was
placed. The patient tolerated the procedure well without immediate
post procedural complication.
IMPRESSION: Successful ultrasound guided placement of a right basilic vein
approach micropuncture sheath for temporary venous access.

## 2017-07-25 MED ORDER — NITROGLYCERIN 0.4 MG SL SUBL
SUBLINGUAL_TABLET | SUBLINGUAL | Status: AC
  Filled 2017-07-25: qty 2

## 2017-07-25 MED ORDER — NITROGLYCERIN 0.4 MG SL SUBL
0.8000 mg | SUBLINGUAL_TABLET | Freq: Once | SUBLINGUAL | Status: AC
  Administered 2017-07-25: 0.8 mg via SUBLINGUAL
  Filled 2017-07-25: qty 25

## 2017-07-25 MED ORDER — IOPAMIDOL (ISOVUE-370) INJECTION 76%
INTRAVENOUS | Status: AC
Start: 2017-07-25 — End: 2017-07-25
  Filled 2017-07-25: qty 100

## 2017-07-25 MED ORDER — LIDOCAINE HCL (PF) 1 % IJ SOLN
INTRAMUSCULAR | Status: DC | PRN
  Administered 2017-07-25: 10 mL

## 2017-07-25 MED ORDER — NITROGLYCERIN 0.4 MG SL SUBL: 0.8000 mg | SUBLINGUAL_TABLET | Freq: Once | SUBLINGUAL | Status: DC

## 2017-07-25 MED ORDER — LIDOCAINE 2% (20 MG/ML) 5 ML SYRINGE
INTRAMUSCULAR | Status: AC
  Filled 2017-07-25: qty 10

## 2017-07-25 MED ORDER — METOPROLOL TARTRATE 5 MG/5ML IV SOLN
INTRAVENOUS | Status: AC
  Filled 2017-07-25: qty 5

## 2017-07-25 NOTE — Progress Notes (Signed)
CT scan completed. Tolerated well. D/C home with wife. Awake and alert. In no distress 

## 2017-08-14 ENCOUNTER — Encounter: Payer: Self-pay | Admitting: *Deleted

## 2017-08-18 DIAGNOSIS — R82998 Other abnormal findings in urine: Secondary | ICD-10-CM | POA: Diagnosis not present

## 2017-08-18 DIAGNOSIS — I10 Essential (primary) hypertension: Secondary | ICD-10-CM | POA: Diagnosis not present

## 2017-08-18 DIAGNOSIS — Z125 Encounter for screening for malignant neoplasm of prostate: Secondary | ICD-10-CM | POA: Diagnosis not present

## 2017-08-25 ENCOUNTER — Encounter: Payer: Self-pay | Admitting: Cardiology

## 2017-08-25 ENCOUNTER — Ambulatory Visit: Payer: Medicare Other | Admitting: Cardiology

## 2017-08-25 ENCOUNTER — Ambulatory Visit (INDEPENDENT_AMBULATORY_CARE_PROVIDER_SITE_OTHER): Payer: Medicare Other | Admitting: Cardiology

## 2017-08-25 VITALS — BP 134/98 | HR 64 | Ht 72.0 in | Wt 279.4 lb

## 2017-08-25 DIAGNOSIS — I255 Ischemic cardiomyopathy: Secondary | ICD-10-CM | POA: Diagnosis not present

## 2017-08-25 DIAGNOSIS — R0609 Other forms of dyspnea: Secondary | ICD-10-CM | POA: Diagnosis not present

## 2017-08-25 DIAGNOSIS — R55 Syncope and collapse: Secondary | ICD-10-CM

## 2017-08-25 DIAGNOSIS — R0789 Other chest pain: Secondary | ICD-10-CM

## 2017-08-25 DIAGNOSIS — I10 Essential (primary) hypertension: Secondary | ICD-10-CM

## 2017-08-25 DIAGNOSIS — Z8679 Personal history of other diseases of the circulatory system: Secondary | ICD-10-CM | POA: Diagnosis not present

## 2017-08-25 DIAGNOSIS — G4733 Obstructive sleep apnea (adult) (pediatric): Secondary | ICD-10-CM | POA: Diagnosis not present

## 2017-08-25 DIAGNOSIS — M545 Low back pain: Secondary | ICD-10-CM | POA: Diagnosis not present

## 2017-08-25 DIAGNOSIS — Z0000 Encounter for general adult medical examination without abnormal findings: Secondary | ICD-10-CM | POA: Diagnosis not present

## 2017-08-25 DIAGNOSIS — R0600 Dyspnea, unspecified: Secondary | ICD-10-CM

## 2017-08-25 DIAGNOSIS — F3289 Other specified depressive episodes: Secondary | ICD-10-CM | POA: Diagnosis not present

## 2017-08-25 DIAGNOSIS — Z6837 Body mass index (BMI) 37.0-37.9, adult: Secondary | ICD-10-CM | POA: Diagnosis not present

## 2017-08-25 DIAGNOSIS — E7849 Other hyperlipidemia: Secondary | ICD-10-CM | POA: Diagnosis not present

## 2017-08-25 DIAGNOSIS — Z1389 Encounter for screening for other disorder: Secondary | ICD-10-CM | POA: Diagnosis not present

## 2017-08-25 MED ORDER — LOSARTAN POTASSIUM-HCTZ 100-25 MG PO TABS: 1.0000 | ORAL_TABLET | Freq: Every day | ORAL | 3 refills | Status: DC

## 2017-08-25 NOTE — Patient Instructions (Addendum)
medication changes   Increase losartan -hctz to 100/25 Mg  One CAPSULE daily   NO OTHER CHANGES   Your physician wants you to follow-up in [place] 2019 [place]. You will receive a reminder letter in the mail two months in advance. If you don't receive a letter, please call our office to schedule the follow-up appointment.   If you need a refill on your cardiac medications before your next appointment, please call your pharmacy.

## 2017-08-25 NOTE — Progress Notes (Signed)
PCP: Leanna Battles, MD Neurologist: Dr. Brett Fairy  Clinic Note: Chief Complaint  Patient presents with  . Follow-up    pt denied chest pain  . Cardiomyopathy    Resolved history of.  Follow-up echo and coronary CT angiogram    HPI:  Alexander [name] is a 67 y.o. male who is being seen today to reestablish cardiology care at the request of Leanna Battles, MD.  He has a history of cardiac disease dating back to 1996 --> had cardioversion x2 during the cath.  No documentation that I can see of any potential PCI.  He developed a cardiomyopathy as a result.  Initial ? MI 1996 (no stents) -->  Developed cardiomyopathy with reduced EF & was forced to retire from [employer].  By 2000, his EF apparently had improved.  ? Last  Cath in ~2000 -- showed low EF but nonobstructive disease.  Also has a history of vasodepressor syncope with abnormal tilt table test   Marcia Lepera was last seen on Jun 20, 2017 as one-year follow-up and to establish new cardiologist care after last being seen by Dr.Rohrbeck in December 2017. A that time he had no real symptoms other than some mild exertional dyspnea..  Blood pressure was stable.  He was notably doing quite well at that time.  No major symptoms.  We ordered a 2D echocardiogram & Coronary Calcium Score/CTA  just to get a new assessment due to his exertional dyspnea symptom.  Recent Hospitalizations: None  Studies Personally Reviewed - (if available, images/films reviewed: From Epic Chart or Care Everywhere)  2D echo June 26, 2017: EF 60-65%.  Mild diastolic dysfunction.  No significant valvular abnormality.  Mild aortic calcification.   Cor Calcium & CTA 07/25/2017 1) Calcium score 0 although some noted in proximal LAD that did not have density to register in calculation 2) Essentially normal right dominant coronary arteries with less than 30% mixed plaque in proximal LAD 3) Normal aortic root 3.2 cm  Interval History: Declan  presents today to discuss the results of his studies.  He has his lipid panel from the his PCP.  Cholesterol levels are fairly well controlled and almost like goal.  Evann actually is doing quite well.  Right now what he is noting is some tenderness to palpation and with certain movements just under the xiphoid process along the the floating rib area.  Otherwise he really is not having any anginal type chest tightness or pressure with rest or exertion.  No exertional dyspnea.  No PND, orthopnea or edema.  He says he is not had any recent headaches from hypertension.  He does occasionally have some positional dizziness, but no near syncopal type symptoms.  No syncope or near syncope.    He stays active, but has not gotten into doing routine exercise.  Remainder of cardiac review of symptoms: No TIA/amaurosis fugax symptoms. No claudication.  ROS: A comprehensive was performed. Review of Systems  Constitutional: Negative for malaise/fatigue.  HENT: Negative for congestion, nosebleeds and sinus pain.   Respiratory: Positive for shortness of breath (Only with more strenuous exertion). Negative for cough.   Cardiovascular: Negative for leg swelling.       Per HPI  Gastrointestinal: Negative for blood in stool and melena.  Genitourinary: Negative for hematuria.  Musculoskeletal: Positive for joint pain (Chronic right foot and ankle pain.  He has had multiple fractures and surgeries. -Limits his walking type exercise). Negative for falls and myalgias.  Neurological: Positive for dizziness (per  HPI) and headaches (when BP is high).  Endo/Heme/Allergies: Does not bruise/bleed easily.  Psychiatric/Behavioral: Negative for memory loss. The patient is not nervous/anxious and does not have insomnia.   All other systems reviewed and are negative.  I have reviewed and (if needed) personally updated the patient's problem list, medications, allergies, past medical and surgical history, social and family  history.   Past Medical History:  Diagnosis Date  . Chronic headaches   . Chronic neck pain    DJD  . Chronic pain in right foot   . DDD (degenerative disc disease), lumbar   . Depression   . Dyslipidemia   . Erectile dysfunction   . Exogenous obesity   . H/O acquired cardiomyopathy 1996   RESOLVED (apparently was thought to have had an MI, but did not have any intervention).  He developed cardiomyopathy that forced him to retire from [employer]..  The current EF 60-65%.  . History of fracture due to fall 1988   Bilateral wrist fractures  . Hypertension   . Hypogonadism male   . IBS (irritable bowel syndrome)   . Obstructive sleep apnea     Past Surgical History:  Procedure Laterality Date  . CARDIAC CATHETERIZATION  2000   Nonobstructive disease.  Persistently reduced EF  . CARPAL TUNNEL RELEASE Left   . CORONARY CT ANGIOGRAM  07/2017   Coronary calcium score 0?.  Focal noncalcific plaque (30%) in proximal LAD.  Otherwise no significant CAD.  Normal aortic root (3.2 cm)  . HAMMER TOE SURGERY Right   . IR RADIOLOGY PERIPHERAL GUIDED IV START  07/25/2017  . IR US GUIDE VASC ACCESS RIGHT  07/25/2017  . MENISCUS REPAIR    . ORIF WRIST FRACTURE Bilateral   . PLANTAR FASCIA RELEASE Right   . SHOULDER ARTHROSCOPY Left   . TENDON REPAIR Right    right quad tendon repair  . Tilt Table Test     Noted to have vasovagal syncope  . TRANSTHORACIC ECHOCARDIOGRAM  06/21/2015   Normal LV function.  EF 57%.  Mild left atrial enlargement, trace AI, trace MR, mild PI.  Marland Kitchen TRANSTHORACIC ECHOCARDIOGRAM  06/2017   EF 60-65%.  Mild diastolic dysfunction.  No significant valvular abnormality.  Mild aortic calcification.    Current Meds  Medication Sig  . aspirin 325 MG tablet Take 325 mg by mouth daily.   . B Complex Vitamins (VITAMIN B COMPLEX PO) Take 2,500 mcg by mouth daily.   Marland Kitchen buPROPion (WELLBUTRIN SR) 100 MG 12 hr tablet TK 1 T PO QAM  . Calcium Carbonate (CALCIUM 600 PO) Take 1  tablet by mouth daily.  . carvedilol (COREG) 6.25 MG tablet Take 6.25 mg by mouth 2 (two) times daily with a meal.  . Cholecalciferol (VITAMIN D3) 5000 units TABS Take by mouth.  . Magnesium 250 MG TABS Take 30 mg by mouth daily.  . Multiple Vitamin (MULTIVITAMIN WITH MINERALS) TABS tablet Take 1 tablet by mouth daily.  . sildenafil (REVATIO) 20 MG tablet Take 20 mg by mouth 3 (three) times daily.  . simvastatin (ZOCOR) 20 MG tablet Take 20 mg by mouth daily.  . Testosterone (FORTESTA) 10 MG/ACT (2%) GEL Place 40 mg onto the skin daily.   Marland Kitchen topiramate (TOPAMAX) 25 MG tablet Pt takes 12.5 mg daily  . Vilazodone HCl (VIIBRYD) 40 MG TABS Take 40 mg by mouth daily.  . [DISCONTINUED] losartan-hydrochlorothiazide (HYZAAR) 50-12.5 MG tablet Take 1 tablet by mouth daily.    No Known Allergies  Social History   Socioeconomic History  . Marital status: Married    Spouse name: None  . Number of children: None  . Years of education: None  . Highest education level: None  Social Needs  . Financial resource strain: None  . Food insecurity - worry: None  . Food insecurity - inability: None  . Transportation needs - medical: None  . Transportation needs - non-medical: None  Occupational History  . None  Tobacco Use  . Smoking status: Former Smoker    Last attempt to quit: 1996    Years since quitting: 23.0  . Smokeless tobacco: Never Used  Substance and Sexual Activity  . Alcohol use: No  . Drug use: No  . Sexual activity: None  Other Topics Concern  . None  Social History Narrative   He has been married for 36 years.     Son (born 28) -lives in [place]   Daughter (born 23) also lives in [place].   Retired from [employer].  Enjoys doing house remodeling.   Remote history of smoking, stopped in 1996.   Minimal alcohol use.    family history includes Cancer in his father and mother; Depression in his mother; Diabetes in his father; Heart disease in his father, mother, and  sister; High Cholesterol in his father and sister; Hypertension in his father and sister.  Wt Readings from Last 3 Encounters:  08/25/17 279 lb 6.4 oz (126.7 kg)  06/20/17 276 lb 12.8 oz (125.6 kg)  06/05/17 280 lb (127 kg)    PHYSICAL EXAM BP (!) 134/98   Pulse 64   Ht 6' (1.829 m)   Wt 279 lb 6.4 oz (126.7 kg)   BMI 37.89 kg/m  Physical Exam  Constitutional: He is oriented to person, place, and time. He appears well-developed and well-nourished. No distress.  Healthy-appearing  HENT:  Head: Normocephalic and atraumatic.  Mouth/Throat: Oropharynx is clear and moist.  Neck: Carotid bruit is not present.  Cardiovascular: Normal rate, regular rhythm and intact distal pulses.  No extrasystoles are present. PMI is not displaced. Exam reveals no gallop and no friction rub.  Murmur (soft SEM) heard.  Medium-pitched crescendo-decrescendo early systolic murmur is present with a grade of 1/6 at the upper right sternal border. Pulmonary/Chest: Effort normal and breath sounds normal. No respiratory distress. He has no wheezes. He has no rales. He exhibits tenderness (Tenderness along the left sternal border down to the xiphoid process and floating ribs.).  Abdominal: Soft. Bowel sounds are normal. He exhibits no distension. There is no tenderness (More epigastric/xiphoid).  Musculoskeletal: Normal range of motion. He exhibits no edema.  Neurological: He is alert and oriented to person, place, and time.  Skin: Skin is warm and dry. No rash noted. No erythema.  Psychiatric: He has a normal mood and affect. His behavior is normal. Judgment and thought content normal.  Nursing note and vitals reviewed.    Adult ECG Report N/A  Other studies Reviewed: Additional studies/ records that were reviewed today include:  Recent Labs: August 18, 2017  Na+ 142, K+ 4.5, Cl- 108, HCO3- 25, BUN 17, Cr 1.0, Glu 134; AST 30, ALT 46, AlkP 70  CBC: W 5.91, H/H 15.9/46.9, Plt 196  TC 143, TG 90, HDL 51,  LDL 74  ASSESSMENT / PLAN:  Reassuring results on those echocardiogram and coronary CT angiogram.  Minimal CAD suggested in the LAD.  This would go along with the reported cath results.  I am not sure  about what his "heart attack was ".  Unfortunately I do not have any old cath reports to go by from back when he was in the [employer].  Problem List Items Addressed This Visit    Chest discomfort associated with dyspnea    Currently, the chest discomfort he is feeling is more of a tenderness to palpation along the xiphoid process.  Not likely to be a cardiac symptom.       Essential hypertension (Chronic)    His blood pressure is much better here than it was at his PCPs office earlier today.  Was 151/97 mmHg this morning at PCP office.  Apparently he had not yet taken his blood pressure medications. Plan: My recommendation would be to simply increase his losartan-HCTZ up to 100 mg / 25 mg daily.      Relevant Medications   losartan-hydrochlorothiazide (HYZAAR) 100-25 MG tablet   Exertional dyspnea (Chronic)    Exertional dyspnea seems to be stable for him.  Thankfully, his echo shows preserved EF with no significant diastolic dysfunction either.  I suspect he does have some diastolic dysfunction with his hypertension, and therefore we will continue to work on treating it. Otherwise no significant evidence of CAD on coronary CT angiogram.  I suspect a lot of this has to do with deconditioning.  We could consider evaluating for chronotropic incompetence with a GXT if symptoms persist.      History of cardiomyopathy - Primary (Chronic)    Relatively normal echocardiogram.  Preserved EF with no significant valvular abnormalities now Minimal CAD on coronary CTA.      Vasovagal syncope (Chronic)    History of positive tilt table test.  No recurrent symptoms. Avoid dehydration, and allow for mild permissive hypertension, targeting blood pressures in the 130/80 mmHg range.      Relevant  Medications   losartan-hydrochlorothiazide (HYZAAR) 100-25 MG tablet      Current medicines are reviewed at length with the patient today. (+/- concerns) n/a The following changes have been made: n/a  Patient Instructions  medication changes   Increase losartan -hctz to 100/25 Mg  One CAPSULE daily   NO OTHER CHANGES   Your physician wants you to follow-up in [place] 2019 [place]. You will receive a reminder letter in the mail two months in advance. If you don't receive a letter, please call our office to schedule the follow-up appointment.   If you need a refill on your cardiac medications before your next appointment, please call your pharmacy.   Studies Ordered:   No orders of the defined types were placed in this encounter.     Glenetta Hew, M.D., M.S. Interventional Cardiologist   Pager # [phone] Phone # [phone] [address]. [place], [place] [address]

## 2017-08-27 ENCOUNTER — Encounter: Payer: Self-pay | Admitting: Cardiology

## 2017-08-27 NOTE — Assessment & Plan Note (Signed)
Currently, the chest discomfort he is feeling is more of a tenderness to palpation along the xiphoid process.  Not likely to be a cardiac symptom.

## 2017-08-27 NOTE — Assessment & Plan Note (Signed)
History of positive tilt table test.  No recurrent symptoms. Avoid dehydration, and allow for mild permissive hypertension, targeting blood pressures in the 130/80 mmHg range.

## 2017-08-27 NOTE — Assessment & Plan Note (Signed)
Exertional dyspnea seems to be stable for him.  Thankfully, his echo shows preserved EF with no significant diastolic dysfunction either.  I suspect he does have some diastolic dysfunction with his hypertension, and therefore we will continue to work on treating it. Otherwise no significant evidence of CAD on coronary CT angiogram.  I suspect a lot of this has to do with deconditioning.  We could consider evaluating for chronotropic incompetence with a GXT if symptoms persist.

## 2017-08-27 NOTE — Assessment & Plan Note (Signed)
His blood pressure is much better here than it was at his PCPs office earlier today.  Was 151/97 mmHg this morning at PCP office.  Apparently he had not yet taken his blood pressure medications. Plan: My recommendation would be to simply increase his losartan-HCTZ up to 100 mg / 25 mg daily.

## 2017-08-27 NOTE — Assessment & Plan Note (Signed)
Relatively normal echocardiogram.  Preserved EF with no significant valvular abnormalities now Minimal CAD on coronary CTA.

## 2017-08-28 ENCOUNTER — Ambulatory Visit: Payer: Medicare Other

## 2017-08-31 ENCOUNTER — Encounter: Payer: Self-pay | Admitting: Neurology

## 2017-09-01 DIAGNOSIS — Z1212 Encounter for screening for malignant neoplasm of rectum: Secondary | ICD-10-CM | POA: Diagnosis not present

## 2017-09-03 ENCOUNTER — Ambulatory Visit (INDEPENDENT_AMBULATORY_CARE_PROVIDER_SITE_OTHER): Payer: Medicare Other | Admitting: Neurology

## 2017-09-03 ENCOUNTER — Encounter: Payer: Self-pay | Admitting: Neurology

## 2017-09-03 VITALS — BP 154/94 | HR 65 | Ht 72.0 in | Wt 277.0 lb

## 2017-09-03 DIAGNOSIS — R0789 Other chest pain: Secondary | ICD-10-CM | POA: Diagnosis not present

## 2017-09-03 DIAGNOSIS — G4733 Obstructive sleep apnea (adult) (pediatric): Secondary | ICD-10-CM | POA: Diagnosis not present

## 2017-09-03 DIAGNOSIS — R55 Syncope and collapse: Secondary | ICD-10-CM | POA: Diagnosis not present

## 2017-09-03 DIAGNOSIS — G4710 Hypersomnia, unspecified: Secondary | ICD-10-CM

## 2017-09-03 DIAGNOSIS — Z8679 Personal history of other diseases of the circulatory system: Secondary | ICD-10-CM

## 2017-09-03 MED ORDER — MODAFINIL 100 MG PO TABS: ORAL_TABLET | ORAL | 5 refills | Status: DC

## 2017-09-03 NOTE — Progress Notes (Signed)
SLEEP MEDICINE CLINIC   Provider:  Larey Seat, M D  Primary Care Physician:  Leanna Battles, MD   Referring Provider: Leanna Battles, MD   Chief Complaint  Patient presents with  . Follow-up    bipap working well.     HPI:  Kullen Tomasetti is a 67 y.o. male , seen here as in a referral  from Dr. Philip Aspen,  a caucasian right handed married male patient from [place].  Mr. Mcglasson carries a diagnosis of sleep apnea as well as hypertension, narcolepsy, myopathy, possibly neuropathy and vasovagal syncope. Further listed are arthritis, hypercholesterolemia,  depression-anxiety, degenerative disc disease, and heart disease/ cardiomyopathy- with an EF of < 50%. He failed a tilt table test. The patient is status post tonsillectomy.  He was diagnosed with sleep apnea in the year 11-16-1997, and was placed on CPAP. He is continued to use CPAP his last sleep study was 5 years ago in [place], performed on 02/19/2012 at [facility]. The final impression was the presence of severe sleep apnea and snoring the patient had 222 obstructive apneas, 12 centrals and 25 mixed apneas and 14 hypopneas.  During REM sleep his AHI was 25.8 per hour, total AHI was 47.7 per hour. The patient has been happy with his machine which is now 67 years old and not as reliable, it is also difficult to travel with. I was able to see a compliance download today, the patient has used the machine 30 out of 30 days with a compliance of over 4 hours for 96.7% of the time this is an auto CPAP mean pressure is 9.4 cm water and average device pressure is 13 cm water with an average AHI of 4.8, which is a sufficient reduction.  Sleep habits are as follows: Mr. Kneisel usual bedtime is around 11 PM, he retreats to his bedroom, which is cool, quiet and dark. He shares a bedroom with his wife, he prefers right lateral sleep position with one pillow. He is usually asleep promptly and stays asleep for 4-5 hours  before he first wakes usually to urinate. Nocturia is usually only once at night. Most nights he can continue to sleep until 7:30 and wakes up spontaneously. He used to wake up with headaches in the morning, but the headaches did not wake him. This has been reduced since he was placed on topiramate by Dr. Philip Aspen. He only takes 25 mg at bedtime.  Sleep medical history and family sleep history: See above. Sleep apnea did affect father , who died in 11/16/80, without knowledge of the diagnosis. Had DM , too.   Social history: married, originally from [place], lived in [place], [place], now in [place]. Son is 76 , daughter is 10, with 3 grand-children.  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 . Retired 1/ 2016.He quit smoking in 11/17/1994, drinks about 8 ounces of alcohol a week in form of wine. Caffeine use in form of coffee, 3-4 cups a day, 3 caffeinated sodas a day, no ice tea or energy drinks.  Interval history from 03 September 2017, I have the pleasure of meeting with Mr. Chao Blazejewski after his recent sleep study, he underwent a split-night polysomnography on 17 June 2017 which confirmed that he still has sleep apnea he had endorsed a very high degree of daytime sleepiness at 24 points prior to the study he had 222 obstructive apneas and 12 central apneas during REM sleep the AHI was 25.8 but his  total AHI was 46.4/h, placing him into the severe category of sleep apnea.  Supine AHI was 63.1. CPAP was initiated at 5 cm and explored all the way up to a CPAP pressure of 18 cm water but the AHI remained in the high double digits.  The technologist changed the patient to BiPAP first initiated at 18/15 centimeters water and advance step-by-step to 20/17 there was then a reduction of the AHI of 0.0 . There were also no longer periodic limb movements of significance, persistent hypertension with a systolic blood pressure of 160 mmHg was noted at the beginning of the study and we ordered a BiPAP of  20/17 cmH2O .  Today is our first compliance visit, the patient has been 100% compliant with at 6 hours 30 minutes average use of time at night the machine is indeed set to 20/17 cmH2O, residual AHI is 1.2 which is an excellent resolution but his 95th percentile air leak is 84.2 L. - initially fitted with a nasal pillow the so-called P 10 by [employer] which he exchanged later full facemask F 10 the.  He states the current mask is large and he would need a medium, but he also has facial hair and the air leak is very high I do think he may be best off returning to a nasal pillow but having a F10 mask in medium size available should the nostrils get irritated. While I do not need to change the settings I will order change in his mask.  We will try the F fullface mask 10 mm size and I would like for him to have a nasal pillow also available in medium size.    Review of Systems: Out of a complete 14 system review, the patient complains of only the following symptoms, and all other reviewed systems are negative.  Fatigue, palpitations, ankle and leg swelling, tinnitus, vertigo, trouble swallowing, aching muscles, joint pain, shortness of breath, snoring, feeling hot, increased thirst, flushing, easy bruising, confusion, headaches, numbness, difficulties with dizziness, depression, anxiety and restless legs at times. He feels less depressed than last visit.   Epworth score  19 from 24/24  , Fatigue severity score  N?A from 63 points , depression score 8/15    Social History   Socioeconomic History  . Marital status: Married    Spouse name: Not on file  . Number of children: Not on file  . Years of education: Not on file  . Highest education level: Not on file  Social Needs  . Financial resource strain: Not on file  . Food insecurity - worry: Not on file  . Food insecurity - inability: Not on file  . Transportation needs - medical: Not on file  . Transportation needs - non-medical: Not on file    Occupational History  . Not on file  Tobacco Use  . Smoking status: Former Smoker    Last attempt to quit: 1996    Years since quitting: 23.0  . Smokeless tobacco: Never Used  Substance and Sexual Activity  . Alcohol use: No  . Drug use: No  . Sexual activity: Not on file  Other Topics Concern  . Not on file  Social History Narrative   He has been married for 36 years.     Son (born 27) -lives in [place]   Daughter (born 42) also lives in [place].   Retired from [employer].  Enjoys doing house remodeling.   Remote history of smoking, stopped in 1996.  Minimal alcohol use.    Family History  Problem Relation Age of Onset  . Cancer Mother   . Heart disease Mother   . Depression Mother   . Cancer Father   . Hypertension Father   . High Cholesterol Father   . Heart disease Father   . Diabetes Father   . Hypertension Sister   . High Cholesterol Sister   . Heart disease Sister     Past Medical History:  Diagnosis Date  . Chronic headaches   . Chronic neck pain    DJD  . Chronic pain in right foot   . DDD (degenerative disc disease), lumbar   . Depression   . Dyslipidemia   . Erectile dysfunction   . Exogenous obesity   . H/O acquired cardiomyopathy 1996   RESOLVED (apparently was thought to have had an MI, but did not have any intervention).  He developed cardiomyopathy that forced him to retire from [employer]..  The current EF 60-65%.  . History of fracture due to fall 1988   Bilateral wrist fractures  . Hypertension   . Hypogonadism male   . IBS (irritable bowel syndrome)   . Obstructive sleep apnea     Past Surgical History:  Procedure Laterality Date  . CARDIAC CATHETERIZATION  2000   Nonobstructive disease.  Persistently reduced EF  . CARPAL TUNNEL RELEASE Left   . CORONARY CT ANGIOGRAM  07/2017   Coronary calcium score 0?.  Focal noncalcific plaque (30%) in proximal LAD.  Otherwise no significant CAD.  Normal aortic root (3.2 cm)  . HAMMER  TOE SURGERY Right   . IR RADIOLOGY PERIPHERAL GUIDED IV START  07/25/2017  . IR US GUIDE VASC ACCESS RIGHT  07/25/2017  . MENISCUS REPAIR    . ORIF WRIST FRACTURE Bilateral   . PLANTAR FASCIA RELEASE Right   . SHOULDER ARTHROSCOPY Left   . TENDON REPAIR Right    right quad tendon repair  . Tilt Table Test     Noted to have vasovagal syncope  . TRANSTHORACIC ECHOCARDIOGRAM  06/21/2015   Normal LV function.  EF 57%.  Mild left atrial enlargement, trace AI, trace MR, mild PI.  Marland Kitchen TRANSTHORACIC ECHOCARDIOGRAM  06/2017   EF 60-65%.  Mild diastolic dysfunction.  No significant valvular abnormality.  Mild aortic calcification.    Current Outpatient Medications  Medication Sig Dispense Refill  . aspirin 325 MG tablet Take 325 mg by mouth daily.     . B Complex Vitamins (VITAMIN B COMPLEX PO) Take 2,500 mcg by mouth daily.     Marland Kitchen buPROPion (WELLBUTRIN SR) 100 MG 12 hr tablet TK 1 T PO QAM  12  . Calcium Carbonate (CALCIUM 600 PO) Take 1 tablet by mouth daily.    . carvedilol (COREG) 6.25 MG tablet Take 6.25 mg by mouth 2 (two) times daily with a meal.    . Cholecalciferol (VITAMIN D3) 5000 units TABS Take by mouth.    . losartan-hydrochlorothiazide (HYZAAR) 100-25 MG tablet Take 1 tablet by mouth daily. 90 tablet 3  . Magnesium 250 MG TABS Take 30 mg by mouth daily.    . Multiple Vitamin (MULTIVITAMIN WITH MINERALS) TABS tablet Take 1 tablet by mouth daily.    . sildenafil (REVATIO) 20 MG tablet Take 20 mg by mouth 3 (three) times daily.    . simvastatin (ZOCOR) 20 MG tablet Take 20 mg by mouth daily.    . Testosterone (FORTESTA) 10 MG/ACT (2%) GEL Place 40 mg  onto the skin daily.     Marland Kitchen topiramate (TOPAMAX) 25 MG tablet Pt takes 12.5 mg daily  12  . Vilazodone HCl (VIIBRYD) 40 MG TABS Take 40 mg by mouth daily.     No current facility-administered medications for this visit.     Allergies as of 09/03/2017  . (No Known Allergies)    Vitals: BP (!) 154/94   Pulse 65   Ht 6' (1.829 m)    Wt 277 lb (125.6 kg)   BMI 37.57 kg/m  Last Weight:  Wt Readings from Last 1 Encounters:  09/03/17 277 lb (125.6 kg)   ZOX:WRUE mass index is 37.57 kg/m.     Last Height:   Ht Readings from Last 1 Encounters:  09/03/17 6' (1.829 m)    Physical exam:  General: The patient is awake, alert and appears not in acute distress. The patient is well groomed. Head: Normocephalic, atraumatic. Neck is supple. Mallampati 3  neck circumference:17.5 . Nasal airflow patent, TMJ click  is evident .  Biological teeth on top, bottom dentures. Full Facial hair  Cardiovascular:  Regular rate and rhythm, without  murmurs or carotid bruit, and without distended neck veins. Respiratory: Lungs are clear to auscultation. RR 16/min.  Skin:  Without evidence of edema, or rash Trunk: BMI is 37.5 from 38.  Neurologic exam :  Cranial nerves: NO change in TASTE AND SMELL since . Pupils are equal and briskly reactive to light.  Extraocular movements  in vertical and horizontal planes intact and without nystagmus. Visual fields by finger perimetry are intact.Hearing to finger rub intact.  Facial sensation intact to fine touch. Facial motor strength is symmetric and tongue and uvula move midline. Shoulder shrug was symmetrical.  Motor exam:  Normal tone, muscle bulk and symmetric strength in all extremities. Some loss of grip strength  Sensory:  Fine touch, pinprick and vibration were normal. Coordination: Rapid alternating movements in the fingers/hands was normal. Finger-to-nose maneuver  normal . Gait and station: Patient walks without assistive device.Tandem gait is unfragmented. Turns with 3 Steps.  Deep tendon reflexes: in the  upper and lower extremities are symmetric and intact. Babinski maneuver deferred.    Assessment:  After physical and neurologic examination, review of laboratory studies,  Personal review of imaging studies, reports of other /same  Imaging studies, results of polysomnography and / or  neurophysiology testing and pre-existing records as far as provided in visit., my assessment is   1) OSA on CPAP , severe OSA confirmed in SPLIT night PSG from 06-17-2017, now on BiPAP 20/ 17cm with good results but high air leak. Needs to change his FFM to a medium size ( according to him ) but with facial hair  He likely need to use a nasal pillow.  Marland Kitchen   2) vertigo- not addressed today, but signs of nystagmus in physical exam. .   3) EDS- Epworth now 19 from 24  after BiPAP use for 60-90 days - Narcolepsy? Depression? This is  while on BIPAP compliantly - the patient will be invited for a PSG on CPAP and followed by [facility]- Has to wean off ViBryid and Bupropion and cannot do this now-  He reported isolated events of sleep paralysis, no dream hallucinations. No cataplexy.  HLA narcolepsy blood  test negative . Will offer modafinil- he is FDA approved as OSA patient with persistent hypersomnia.  Cardiologist input desired- HTN and  Component 57moago  HLA-DQ ALPHA Negative   HLA-DQ BETA Negative  Comment: Final Results:  DQA1*01:HXJ,05:CMM  DQB1*03:AXZVB,06:AURFZ  Code Translation:     .    The patient was advised of the nature of the diagnosed disorder , the treatment options and the  risks for general health and wellness arising from not treating the condition.   I spent more than 55 minutes of face to face time with the patient.  Greater than 50% of time was spent in counseling and coordination of care. We have discussed the diagnosis and differential and I answered the patient's questions.    Plan:  Treatment plan and additional workup : I like for Mr. Fitzmaurice to continue using his BiPAP machine as currently set.  I adjusted his humidifier today as the machine runs dry in the early morning hours wakes him up.  The higher D months for humidity may be related to the high air leaks we will address the air leaks by changing his full facemask to a medium size, I will asked Melissa to fit him.   I will also ask aero care to have tentative nasal pillow P 10 and medium available for him.  Third option is shaving his beard.   Modafinil 150 mg for PRN use, driving etc.   Larey Seat, MD 12/13/8339, 9:62 AM  Certified in Neurology by ABPN Certified in [place] by [facility] [address] [place], [place] [address]    Cc Lenna Sciara at [place], [place]

## 2017-09-03 NOTE — Assessment & Plan Note (Signed)
Modafinil 150 mg  Prn use/

## 2017-09-03 NOTE — Patient Instructions (Signed)
Modafinil tablets  What is this medicine?  MODAFINIL (moe DAF i nil) is used to treat excessive sleepiness caused by certain sleep disorders. This includes narcolepsy, sleep apnea, and shift work sleep disorder.  This medicine may be used for other purposes; ask your health care provider or pharmacist if you have questions.  COMMON BRAND NAME(S): Provigil  What should I tell my health care provider before I take this medicine?  They need to know if you have any of these conditions:  -history of depression, mania, or other mental disorder  -kidney disease  -liver disease  -an unusual or allergic reaction to modafinil, other medicines, foods, dyes, or preservatives  -pregnant or trying to get pregnant  -breast-feeding  How should I use this medicine?  Take this medicine by mouth with a glass of water. Follow the directions on the prescription label. Take your doses at regular intervals. Do not take your medicine more often than directed. Do not stop taking except on your doctor's advice.  A special MedGuide will be given to you by the pharmacist with each prescription and refill. Be sure to read this information carefully each time.  Talk to your pediatrician regarding the use of this medicine in children. This medicine is not approved for use in children.  Overdosage: If you think you have taken too much of this medicine contact a poison control center or emergency room at once.  NOTE: This medicine is only for you. Do not share this medicine with others.  What if I miss a dose?  If you miss a dose, take it as soon as you can. If it is almost time for your next dose, take only that dose. Do not take double or extra doses.  What may interact with this medicine?  Do not take this medicine with any of the following medications:  -amphetamine or dextroamphetamine  -dexmethylphenidate or methylphenidate  -medicines called MAO Inhibitors like Nardil, Parnate, Marplan, Eldepryl  -pemoline  -procarbazine  This medicine may  also interact with the following medications:  -antifungal medicines like itraconazole or ketoconazole  -barbiturates like phenobarbital  -birth control pills or other hormone-containing birth control devices or implants  -carbamazepine  -cyclosporine  -diazepam  -medicines for depression, anxiety, or psychotic disturbances  -phenytoin  -propranolol  -triazolam  -warfarin  This list may not describe all possible interactions. Give your health care provider a list of all the medicines, herbs, non-prescription drugs, or dietary supplements you use. Also tell them if you smoke, drink alcohol, or use illegal drugs. Some items may interact with your medicine.  What should I watch for while using this medicine?  Visit your doctor or health care professional for regular checks on your progress. The full effects of this medicine may not be seen right away.  This medicine may affect your concentration, function, or may hide signs that you are tired. You may get dizzy. Do not drive, use machinery, or do anything that needs mental alertness until you know how this drug affects you. Alcohol can make you more dizzy and may interfere with your response to this medicine or your alertness. Avoid alcoholic drinks.  Birth control pills may not work properly while you are taking this medicine. Talk to your doctor about using an extra method of birth control.  It is unknown if the effects of this medicine will be increased by the use of caffeine. Caffeine is available in many foods, beverages, and medications. Ask your doctor if you should limit or   swelling of the face, lips, or tongue -anxiety -breathing problems -chest pain -fast, irregular  heartbeat -hallucinations -increased blood pressure -redness, blistering, peeling or loosening of the skin, including inside the mouth -sore throat, fever, or chills -suicidal thoughts or other mood changes -tremors -vomiting Side effects that usually do not require medical attention (report to your doctor or health care professional if they continue or are bothersome): -headache -nausea, diarrhea, or stomach upset -nervousness -trouble sleeping This list may not describe all possible side effects. Call your doctor for medical advice about side effects. You may report side effects to FDA at 1-800-FDA-1088. Where should I keep my medicine? Keep out of the reach of children. This medicine can be abused. Keep your medicine in a safe place to protect it from theft. Do not share this medicine with anyone. Selling or giving away this medicine is dangerous and against the law. This medicine may cause accidental overdose and death if taken by other adults, children, or pets. Mix any unused medicine with a substance like cat litter or coffee grounds. Then throw the medicine away in a sealed container like a sealed bag or a coffee can with a lid. Do not use the medicine after the expiration date. Store at room temperature between 20 and 25 degrees C (68 and 77 degrees F). NOTE: This sheet is a summary. It may not cover all possible information. If you have questions about this medicine, talk to your doctor, pharmacist, or health care provider.  2018 Elsevier/Gold Standard (2014-04-26 15:34:55)  

## 2017-09-16 ENCOUNTER — Telehealth: Payer: Self-pay | Admitting: Neurology

## 2017-09-16 NOTE — Telephone Encounter (Signed)
-----  Message from Leonie Man, MD sent at 09/03/2017  4:27 PM EST ----- Should be OK  DH ----- Message ----- From: Larey Seat, MD Sent: 09/03/2017   9:07 AM To: Leonie Man, MD  Dear Shanon Brow,   I just met with our mutual patient who is still excessively sleepy  While 100% compliant on BiPAP, with good reduction in apnea.  A Would you be concerned about him using modafinil as a medication prn for wakefulness- he endorsed sleepiness while , reading , watching Tv, falling asleep in theater or cinema.  NOT WHILE DRIVING.  Please send me a message-  Asencion Partridge

## 2017-09-16 NOTE — Telephone Encounter (Signed)
Cardiology OK with Modafinil - 100 mg was your start dose. Please let us know if it's helping and/ or  if you think you need a higher dose.   Lanetta Inch Dohmeier MD

## 2017-09-18 ENCOUNTER — Other Ambulatory Visit: Payer: Self-pay | Admitting: *Deleted

## 2017-09-18 MED ORDER — SIMVASTATIN 20 MG PO TABS: 20.0000 mg | ORAL_TABLET | Freq: Every day | ORAL | 11 refills | Status: DC

## 2017-09-19 ENCOUNTER — Other Ambulatory Visit: Payer: Self-pay | Admitting: *Deleted

## 2017-09-19 MED ORDER — CARVEDILOL 6.25 MG PO TABS: 6.2500 mg | ORAL_TABLET | Freq: Two times a day (BID) | ORAL | 3 refills | Status: DC

## 2017-09-19 NOTE — Telephone Encounter (Signed)
REFILL 

## 2017-10-13 ENCOUNTER — Ambulatory Visit: Payer: Medicare Other | Admitting: Nurse Practitioner

## 2017-10-15 ENCOUNTER — Encounter: Payer: Self-pay | Admitting: Internal Medicine

## 2017-10-16 ENCOUNTER — Other Ambulatory Visit: Payer: Self-pay | Admitting: *Deleted

## 2017-10-16 MED ORDER — SIMVASTATIN 20 MG PO TABS: 20.0000 mg | ORAL_TABLET | Freq: Every day | ORAL | 2 refills | Status: DC

## 2017-10-16 NOTE — Telephone Encounter (Signed)
Patient notified directly and [facility]

## 2017-10-16 NOTE — Telephone Encounter (Signed)
Patient left a msg on the refill vm stating that the rx for simvastatin with [facility] needs to be changed to a ninety day supply. Call back number provided was [phone]. Thanks, MI

## 2017-11-05 ENCOUNTER — Telehealth: Payer: Self-pay | Admitting: Gastroenterology

## 2017-11-05 NOTE — Telephone Encounter (Signed)
Pt states he requesting to transfer from [place] to [place] because he lives in [place] and would like to establish care closer to him. Records have been placed on Dr.Armbruster's desk for review.

## 2017-11-13 ENCOUNTER — Encounter: Payer: Self-pay | Admitting: Gastroenterology

## 2017-11-13 NOTE — Telephone Encounter (Signed)
Dr.Armbruster reviewed records and okayed for direct book for colonoscopy needs 2 day prep based on last exam. Left message for patient to call back and schedule appointments.

## 2017-11-20 DIAGNOSIS — J020 Streptococcal pharyngitis: Secondary | ICD-10-CM | POA: Diagnosis not present

## 2017-11-20 DIAGNOSIS — I10 Essential (primary) hypertension: Secondary | ICD-10-CM | POA: Diagnosis not present

## 2017-11-20 DIAGNOSIS — J029 Acute pharyngitis, unspecified: Secondary | ICD-10-CM | POA: Diagnosis not present

## 2017-11-20 DIAGNOSIS — H1089 Other conjunctivitis: Secondary | ICD-10-CM | POA: Diagnosis not present

## 2017-11-26 DIAGNOSIS — R221 Localized swelling, mass and lump, neck: Secondary | ICD-10-CM | POA: Diagnosis not present

## 2017-11-26 DIAGNOSIS — Q758 Other specified congenital malformations of skull and face bones: Secondary | ICD-10-CM | POA: Diagnosis not present

## 2017-11-26 DIAGNOSIS — Z6836 Body mass index (BMI) 36.0-36.9, adult: Secondary | ICD-10-CM | POA: Diagnosis not present

## 2017-11-26 DIAGNOSIS — M542 Cervicalgia: Secondary | ICD-10-CM | POA: Diagnosis not present

## 2017-12-03 DIAGNOSIS — M2420 Disorder of ligament, unspecified site: Secondary | ICD-10-CM | POA: Diagnosis not present

## 2017-12-03 DIAGNOSIS — M542 Cervicalgia: Secondary | ICD-10-CM | POA: Diagnosis not present

## 2017-12-03 DIAGNOSIS — K219 Gastro-esophageal reflux disease without esophagitis: Secondary | ICD-10-CM | POA: Diagnosis not present

## 2017-12-03 DIAGNOSIS — G44219 Episodic tension-type headache, not intractable: Secondary | ICD-10-CM | POA: Diagnosis not present

## 2018-01-05 DIAGNOSIS — K219 Gastro-esophageal reflux disease without esophagitis: Secondary | ICD-10-CM | POA: Diagnosis not present

## 2018-01-06 ENCOUNTER — Ambulatory Visit (AMBULATORY_SURGERY_CENTER): Payer: Self-pay

## 2018-01-06 ENCOUNTER — Other Ambulatory Visit: Payer: Self-pay

## 2018-01-06 VITALS — Ht 72.0 in | Wt 273.6 lb

## 2018-01-06 DIAGNOSIS — Z86010 Personal history of colonic polyps: Secondary | ICD-10-CM

## 2018-01-06 NOTE — Progress Notes (Signed)
No egg or soy allergy known to patient  No issues with past sedation with any surgeries  or procedures, no intubation problems  No diet pills per patient No home 02 use per patient  No blood thinners per patient  Pt denies issues with constipation  No A fib or A flutter  EMMI video sent to pt's e mail , pt declined    

## 2018-01-20 ENCOUNTER — Other Ambulatory Visit: Payer: Self-pay

## 2018-01-20 ENCOUNTER — Encounter: Payer: Self-pay | Admitting: Gastroenterology

## 2018-01-20 ENCOUNTER — Ambulatory Visit (AMBULATORY_SURGERY_CENTER): Payer: Medicare Other | Admitting: Gastroenterology

## 2018-01-20 VITALS — BP 127/86 | HR 51 | Temp 99.5°F | Resp 10 | Ht 72.0 in | Wt 273.0 lb

## 2018-01-20 DIAGNOSIS — D123 Benign neoplasm of transverse colon: Secondary | ICD-10-CM

## 2018-01-20 DIAGNOSIS — D122 Benign neoplasm of ascending colon: Secondary | ICD-10-CM

## 2018-01-20 DIAGNOSIS — D120 Benign neoplasm of cecum: Secondary | ICD-10-CM

## 2018-01-20 DIAGNOSIS — Z86010 Personal history of colonic polyps: Secondary | ICD-10-CM | POA: Diagnosis not present

## 2018-01-20 MED ORDER — SODIUM CHLORIDE 0.9 % IV SOLN: 500.0000 mL | Freq: Once | INTRAVENOUS | Status: DC

## 2018-01-20 NOTE — Op Note (Signed)
[facility] Patient Name: Render Marley Procedure Date: 01/20/2018 12:35 PM MRN: [id number] Endoscopist: Remo Lipps P. Havery Moros , MD Age: 67 Referring MD:  Date of Birth: 08-Jan-1951 Gender: Male Account #: [id number] Procedure:                Colonoscopy Indications:              High risk colon cancer surveillance: Personal                            history of colonic polyps - reported history of                            multiple large ascending colon polyps removed in                            piecemeal a few years ago Medicines:                Monitored Anesthesia Care Procedure:                Pre-Anesthesia Assessment:                           - Prior to the procedure, a History and Physical                            was performed, and patient medications and                            allergies were reviewed. The patient's tolerance of                            previous anesthesia was also reviewed. The risks                            and benefits of the procedure and the sedation                            options and risks were discussed with the patient.                            All questions were answered, and informed consent                            was obtained. Prior Anticoagulants: The patient has                            taken no previous anticoagulant or antiplatelet                            agents. ASA Grade Assessment: III - A patient with                            severe systemic disease. After reviewing the risks  and benefits, the patient was deemed in                            satisfactory condition to undergo the procedure.                           After obtaining informed consent, the colonoscope                            was passed under direct vision. Throughout the                            procedure, the patient's blood pressure, pulse, and                            oxygen saturations were monitored  continuously. The                            Colonoscope was introduced through the anus and                            advanced to the the cecum, identified by                            appendiceal orifice and ileocecal valve. The                            colonoscopy was performed without difficulty. The                            patient tolerated the procedure well. The quality                            of the bowel preparation was adequate. The                            ileocecal valve, appendiceal orifice, and rectum                            were photographed. Scope In: 12:48:45 PM Scope Out: 1:13:49 PM Scope Withdrawal Time: 0 hours 19 minutes 39 seconds  Total Procedure Duration: 0 hours 25 minutes 4 seconds  Findings:                 The perianal and digital rectal examinations were                            normal.                           A 3 mm polyp was found in the cecum. The polyp was                            sessile. The polyp was removed with a cold snare.  Resection and retrieval were complete.                           Two sessile polyps were found in the ascending                            colon. The polyps were 4 to 5 mm in size. One of                            which was along a polypectomy scar, concerning for                            residual polyp. These polyps were removed with a                            cold snare. Resection and retrieval were complete.                           A 4 mm polyp was found in the hepatic flexure. The                            polyp was sessile. The polyp was removed with a                            cold snare. Resection and retrieval were complete.                           Four sessile polyps were found in the transverse                            colon. The polyps were 3 to 5 mm in size. These                            polyps were removed with a cold snare. Resection                             and retrieval were complete.                           Internal hemorrhoids were found during retroflexion.                           The exam was otherwise without abnormality. Prep                            was fair in the right colon on insertion, time was                            taken to lavage it to ensure adequate views. Complications:            No immediate complications. Estimated blood loss:  Minimal. Estimated Blood Loss:     Estimated blood loss was minimal. Impression:               - One 3 mm polyp in the cecum, removed with a cold                            snare. Resected and retrieved.                           - Two 4 to 5 mm polyps in the ascending colon,                            removed with a cold snare. Resected and retrieved.                           - One 4 mm polyp at the hepatic flexure, removed                            with a cold snare. Resected and retrieved.                           - Four 3 to 5 mm polyps in the transverse colon,                            removed with a cold snare. Resected and retrieved.                           - Internal hemorrhoids.                           - The examination was otherwise normal. Recommendation:           - Patient has a contact number available for                            emergencies. The signs and symptoms of potential                            delayed complications were discussed with the                            patient. Return to normal activities tomorrow.                            Written discharge instructions were provided to the                            patient.                           - Resume previous diet.                           - Continue present medications.                           -  Await pathology results with further                            recommendations Remo Lipps P. Armbruster, MD 01/20/2018 1:18:42 PM This report has been signed  electronically.

## 2018-01-20 NOTE — Progress Notes (Signed)
No changes in medical or surgical hx since PV per pt 

## 2018-01-20 NOTE — Progress Notes (Signed)
Spontaneous respirations throughout. VSS. Resting comfortably. To PACU on room air. Report to  RN. 

## 2018-01-20 NOTE — Progress Notes (Signed)
Called to room to assist during endoscopic procedure.  Patient ID and intended procedure confirmed with present staff. Received instructions for my participation in the procedure from the performing physician.  

## 2018-01-20 NOTE — Patient Instructions (Signed)
YOU HAD AN ENDOSCOPIC PROCEDURE TODAY AT [facility]:   Refer to the procedure report that was given to you for any specific questions about what was found during the examination.  If the procedure report does not answer your questions, please call your gastroenterologist to clarify.  If you requested that your care partner not be given the details of your procedure findings, then the procedure report has been included in a sealed envelope for you to review at your convenience later.  YOU SHOULD EXPECT: Some feelings of bloating in the abdomen. Passage of more gas than usual.  Walking can help get rid of the air that was put into your GI tract during the procedure and reduce the bloating. If you had a lower endoscopy (such as a colonoscopy or flexible sigmoidoscopy) you may notice spotting of blood in your stool or on the toilet paper. If you underwent a bowel prep for your procedure, you may not have a normal bowel movement for a few days.  Please Note:  You might notice some irritation and congestion in your nose or some drainage.  This is from the oxygen used during your procedure.  There is no need for concern and it should clear up in a day or so.  SYMPTOMS TO REPORT IMMEDIATELY:   Following lower endoscopy (colonoscopy or flexible sigmoidoscopy):  Excessive amounts of blood in the stool  Significant tenderness or worsening of abdominal pains  Swelling of the abdomen that is new, acute  Fever of 100F or higher  Please see handouts given to you on polyps and Hemorrhoids today.  For urgent or emergent issues, a gastroenterologist can be reached at any hour by calling [phone].   DIET:  We do recommend a small meal at first, but then you may proceed to your regular diet.  Drink plenty of fluids but you should avoid alcoholic beverages for 24 hours.  ACTIVITY:  You should plan to take it easy for the rest of today and you should NOT DRIVE or use heavy machinery until  tomorrow (because of the sedation medicines used during the test).    FOLLOW UP: Our staff will call the number listed on your records the next business day following your procedure to check on you and address any questions or concerns that you may have regarding the information given to you following your procedure. If we do not reach you, we will leave a message.  However, if you are feeling well and you are not experiencing any problems, there is no need to return our call.  We will assume that you have returned to your regular daily activities without incident.  If any biopsies were taken you will be contacted by phone or by letter within the next 1-3 weeks.  Please call us at [phone] if you have not heard about the biopsies in 3 weeks.    SIGNATURES/CONFIDENTIALITY: You and/or your care partner have signed paperwork which will be entered into your electronic medical record.  These signatures attest to the fact that that the information above on your After Visit Summary has been reviewed and is understood.  Full responsibility of the confidentiality of this discharge information lies with you and/or your care-partner.  Thank you for letting us take care of your healthcare needs today.

## 2018-01-21 ENCOUNTER — Telehealth: Payer: Self-pay | Admitting: *Deleted

## 2018-01-21 NOTE — Telephone Encounter (Signed)
  Follow up Call-  Call back number 01/20/2018  Post procedure Call Back phone  # wife- [phone]  Permission to leave phone message Yes     Patient questions:  Do you have a fever, pain , or abdominal swelling? No. Pain Score  0 *  Have you tolerated food without any problems? Yes.    Have you been able to return to your normal activities? Yes.    Do you have any questions about your discharge instructions: Diet   No. Medications  No. Follow up visit  No.  Do you have questions or concerns about your Care? No.  Actions: * If pain score is 4 or above: No action needed, pain <4.

## 2018-02-02 ENCOUNTER — Encounter: Payer: Self-pay | Admitting: Gastroenterology

## 2018-02-23 DIAGNOSIS — Z87891 Personal history of nicotine dependence: Secondary | ICD-10-CM | POA: Diagnosis not present

## 2018-02-23 DIAGNOSIS — M545 Low back pain: Secondary | ICD-10-CM | POA: Diagnosis not present

## 2018-02-23 DIAGNOSIS — R7309 Other abnormal glucose: Secondary | ICD-10-CM | POA: Diagnosis not present

## 2018-02-23 DIAGNOSIS — G4733 Obstructive sleep apnea (adult) (pediatric): Secondary | ICD-10-CM | POA: Diagnosis not present

## 2018-02-23 DIAGNOSIS — Z6835 Body mass index (BMI) 35.0-35.9, adult: Secondary | ICD-10-CM | POA: Diagnosis not present

## 2018-02-23 DIAGNOSIS — I10 Essential (primary) hypertension: Secondary | ICD-10-CM | POA: Diagnosis not present

## 2018-02-23 DIAGNOSIS — N528 Other male erectile dysfunction: Secondary | ICD-10-CM | POA: Diagnosis not present

## 2018-02-23 DIAGNOSIS — F3289 Other specified depressive episodes: Secondary | ICD-10-CM | POA: Diagnosis not present

## 2018-02-23 DIAGNOSIS — E7849 Other hyperlipidemia: Secondary | ICD-10-CM | POA: Diagnosis not present

## 2018-02-26 ENCOUNTER — Other Ambulatory Visit: Payer: Self-pay | Admitting: Internal Medicine

## 2018-02-26 DIAGNOSIS — Z87891 Personal history of nicotine dependence: Secondary | ICD-10-CM

## 2018-03-01 ENCOUNTER — Encounter: Payer: Self-pay | Admitting: Neurology

## 2018-03-03 ENCOUNTER — Encounter: Payer: Self-pay | Admitting: Neurology

## 2018-03-03 ENCOUNTER — Ambulatory Visit (INDEPENDENT_AMBULATORY_CARE_PROVIDER_SITE_OTHER): Payer: Medicare Other | Admitting: Neurology

## 2018-03-03 VITALS — BP 129/79 | HR 55 | Ht 72.0 in | Wt 267.0 lb

## 2018-03-03 DIAGNOSIS — G47411 Narcolepsy with cataplexy: Secondary | ICD-10-CM

## 2018-03-03 DIAGNOSIS — R0689 Other abnormalities of breathing: Secondary | ICD-10-CM | POA: Diagnosis not present

## 2018-03-03 DIAGNOSIS — G4733 Obstructive sleep apnea (adult) (pediatric): Secondary | ICD-10-CM | POA: Insufficient documentation

## 2018-03-03 DIAGNOSIS — F518 Other sleep disorders not due to a substance or known physiological condition: Secondary | ICD-10-CM | POA: Insufficient documentation

## 2018-03-03 DIAGNOSIS — G4719 Other hypersomnia: Secondary | ICD-10-CM | POA: Diagnosis not present

## 2018-03-03 DIAGNOSIS — G4753 Recurrent isolated sleep paralysis: Secondary | ICD-10-CM

## 2018-03-03 MED ORDER — MODAFINIL 100 MG PO TABS: ORAL_TABLET | ORAL | 5 refills | Status: DC

## 2018-03-03 NOTE — Progress Notes (Signed)
SLEEP MEDICINE CLINIC   Provider:  Larey Seat, M D  Primary Care Physician:  Leanna Battles, MD   Referring Provider: Leanna Battles, MD   Chief Complaint  Patient presents with  . Follow-up    pt alone, rm 11. pt states that the machine is working well. he had some questions and brought in both masks to talk about the mask.  He has a temporal headache, not sleep related- behind the right eye- ice pick headaches.   He is excessively daytime sleepy while compliant on BiPAP and reports cataplectic attacks, sleep paralysis and vivid, lucid dreams about suffocating" .    HPI:  Alexander [name] is a 67 y.o. male , seen here as in a referral  from Dr. Philip Aspen,  a caucasian right handed married male patient from [place].  Alexander [name] carries a diagnosis of sleep apnea as well as hypertension, narcolepsy, myopathy, possibly neuropathy and vasovagal syncope. Further listed are arthritis, hypercholesterolemia,  depression-anxiety, degenerative disc disease, and heart disease/ cardiomyopathy- with an EF of < 50%. He failed a tilt table test. The patient is status post tonsillectomy. He was diagnosed with sleep apnea in the year 11-08-1997, and was placed on CPAP. He is continued to use CPAP his last sleep study was 5 years ago in [place], performed on 02/19/2012 at [facility]. The final impression was the presence of severe sleep apnea and snoring the patient had 222 obstructive apneas, 12 centrals and 25 mixed apneas and 14 hypopneas.  During REM sleep his AHI was 25.8 per hour, total AHI was 47.7 per hour. The patient has been happy with his machine which is now 67 years old and not as reliable, it is also difficult to travel with. I was able to see a compliance download today, the patient has used the machine 30 out of 30 days with a compliance of over 4 hours for 96.7% of the time this is an auto CPAP mean pressure is 9.4 cm water and average device pressure is 13 cm water  with an average AHI of 4.8, which is a sufficient reduction.  Sleep habits are as follows: Alexander [name] usual bedtime is around 11 PM, he retreats to his bedroom, which is cool, quiet and dark. He shares a bedroom with his wife, he prefers right lateral sleep position with one pillow. He is usually asleep promptly and stays asleep for 4-5 hours before he first wakes usually to urinate. Nocturia is usually only once at night. Most nights he can continue to sleep until 7:30 and wakes up spontaneously. He used to wake up with headaches in the morning, but the headaches did not wake him. This has been reduced since he was placed on topiramate by Dr. Philip Aspen. He only takes 25 mg at bedtime.  Sleep medical history and family sleep history: See above. Sleep apnea did affect father , who died in 11-08-1980, without knowledge of the diagnosis. Had DM , too.   Social history: married, originally from [place], lived in [place], [place], now in [place]. Son is 17 , daughter is 47, with 3 grand-children.  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 . Retired 1/ 2016.He quit smoking in 1994/11/09, drinks about 8 ounces of alcohol a week in form of wine. Caffeine use in form of coffee, 3-4 cups a day, 3 caffeinated sodas a day, no ice tea or energy drinks.  Interval history from 03 September 2017, I have the pleasure of meeting  with Alexander [name] after his recent sleep study, he underwent a split-night polysomnography on 17 June 2017 which confirmed that he still has sleep apnea he had endorsed a very high degree of daytime sleepiness at 24 points prior to the study he had 222 obstructive apneas and 12 central apneas during REM sleep the AHI was 25.8 but his total AHI was 46.4/h, placing him into the severe category of sleep apnea.  Supine AHI was 63.1. CPAP was initiated at 5 cm and explored all the way up to a CPAP pressure of 18 cm water but the AHI remained in the high double digits.  The technologist  changed the patient to BiPAP first initiated at 18/15 centimeters water and advance step-by-step to 20/17 there was then a reduction of the AHI of 0.0 . There were also no longer periodic limb movements of significance, persistent hypertension with a systolic blood pressure of 160 mmHg was noted at the beginning of the study and we ordered a BiPAP of 20/17 cmH2O .  Today is our first compliance visit, the patient has been 100% compliant with at 6 hours 30 minutes average use of time at night the machine is indeed set to 20/17 cmH2O, residual AHI is 1.2 which is an excellent resolution but his 95th percentile air leak is 84.2 L. - initially fitted with a nasal pillow the so-called P 10 by [employer] which he exchanged later full face mask F 10 .  He states the current mask is large ,he would need a medium size , but he also has facial hair and the air leak is very high.  I do think he may be best off returning to a nasal pillow but having a F10 mask in medium size available should the nostrils get irritated. While I do not need to change the settings I will order change in his mask.  We will try the F fullface mask 10 mm size and I would like for him to have a nasal pillow also available in medium size.  Today's 03 March 2018 and I have the pleasure of meeting Alexander [name] again, a 66 year old Caucasian gentleman who has been using CPAP with limited success and was changed to BiPAP.  He is currently using 20/17 centimeters water pressure with a residual AHI of 3.8 800% compliance with an average use at time of 7 hours and 18 minutes.  There are minimal air leaks, the tidal volume seems to be stable from night to night with little variation.  In spite of the good control of apnea the patient endorsed the Epworth Sleepiness Scale at 22 points.  He is also feeling extremely fatigued.  He reported just having nodded off before I entered the exam room. He reports lucid dreams, sleep paralysis, breath holding  spells, depression (on Vibryyd), cataplexy.    Review of Systems: Out of a complete 14 system review, the patient complains of only the following symptoms, and all other reviewed systems are negative.  Fatigue, palpitations, ankle and leg swelling, tinnitus, vertigo, trouble swallowing, aching muscles, joint pain, shortness of breath, difficulties with dizziness, depression, anxiety and restless legs at times. He feels less depressed than last visit.  He has a temporal headache, not sleep related- behind the right eye- ice pick headaches.    Epworth score  From 19 to  22/24  ,  Fatigue severity score   45 from 63 points , depression score 8/15    Social History   Socioeconomic History  .  Marital status: Married    Spouse name: Not on file  . Number of children: Not on file  . Years of education: Not on file  . Highest education level: Not on file  Occupational History  . Not on file  Social Needs  . Financial resource strain: Not on file  . Food insecurity:    Worry: Not on file    Inability: Not on file  . Transportation needs:    Medical: Not on file    Non-medical: Not on file  Tobacco Use  . Smoking status: Former Smoker    Last attempt to quit: 1996    Years since quitting: 23.5  . Smokeless tobacco: Never Used  Substance and Sexual Activity  . Alcohol use: No  . Drug use: No  . Sexual activity: Not on file  Lifestyle  . Physical activity:    Days per week: Not on file    Minutes per session: Not on file  . Stress: Not on file  Relationships  . Social connections:    Talks on phone: Not on file    Gets together: Not on file    Attends religious service: Not on file    Active member of club or organization: Not on file    Attends meetings of clubs or organizations: Not on file    Relationship status: Not on file  . Intimate partner violence:    Fear of current or ex partner: Not on file    Emotionally abused: Not on file    Physically abused: Not on file      Forced sexual activity: Not on file  Other Topics Concern  . Not on file  Social History Narrative   He has been married for 36 years.     Son (born 4) -lives in [place]   Daughter (born 58) also lives in [place].   Retired from [employer].  Enjoys doing house remodeling.   Remote history of smoking, stopped in 1996.   Minimal alcohol use.    Family History  Problem Relation Age of Onset  . Cancer Mother        duodenal CA  . Heart disease Mother   . Depression Mother   . Stomach cancer Mother   . Cancer Father   . Hypertension Father   . High Cholesterol Father   . Heart disease Father   . Diabetes Father   . Hypertension Sister   . High Cholesterol Sister   . Heart disease Sister   . Esophageal cancer Neg Hx   . Rectal cancer Neg Hx   . Colon cancer Neg Hx     Past Medical History:  Diagnosis Date  . Allergy   . Cancer ([place])    skin cancer to upper chest/basal cell 2007  . Chronic headaches   . Chronic neck pain    DJD  . Chronic pain in right foot   . DDD (degenerative disc disease), lumbar   . Depression   . Dyslipidemia   . Erectile dysfunction   . Exogenous obesity   . GERD (gastroesophageal reflux disease)   . H/O acquired cardiomyopathy 1996   RESOLVED (apparently was thought to have had an MI, but did not have any intervention).  He developed cardiomyopathy that forced him to retire from [employer]..  The current EF 60-65%.  . History of fracture due to fall 1988   Bilateral wrist fractures  . Hyperlipidemia   . Hypertension   .  Hypogonadism male   . IBS (irritable bowel syndrome)   . Obstructive sleep apnea   . Sleep apnea    cpap    Past Surgical History:  Procedure Laterality Date  . CARDIAC CATHETERIZATION  2000   Nonobstructive disease.  Persistently reduced EF  . CARPAL TUNNEL RELEASE Left   . COLONOSCOPY    . CORONARY CT ANGIOGRAM  07/2017   Coronary calcium score 0?.  Focal noncalcific plaque (30%) in proximal LAD.   Otherwise no significant CAD.  Normal aortic root (3.2 cm)  . HAMMER TOE SURGERY Right   . ingrown toenail surgery Bilateral    2017  . IR RADIOLOGY PERIPHERAL GUIDED IV START  07/25/2017  . IR US GUIDE VASC ACCESS RIGHT  07/25/2017  . MENISCUS REPAIR    . ORIF WRIST FRACTURE Bilateral   . PLANTAR FASCIA RELEASE Right   . SHOULDER ARTHROSCOPY Left   . TENDON REPAIR Right    right quad tendon repair  . Tilt Table Test     Noted to have vasovagal syncope  . TRANSTHORACIC ECHOCARDIOGRAM  06/21/2015   Normal LV function.  EF 57%.  Mild left atrial enlargement, trace AI, trace MR, mild PI.  Marland Kitchen TRANSTHORACIC ECHOCARDIOGRAM  06/2017   EF 60-65%.  Mild diastolic dysfunction.  No significant valvular abnormality.  Mild aortic calcification.    Current Outpatient Medications  Medication Sig Dispense Refill  . aspirin 325 MG tablet Take 325 mg by mouth daily.     . B Complex Vitamins (VITAMIN B COMPLEX PO) Take 2,500 mcg by mouth daily.     . Calcium Carbonate (CALCIUM 600 PO) Take 1 tablet by mouth daily.    . carvedilol (COREG) 6.25 MG tablet Take 1 tablet (6.25 mg total) by mouth 2 (two) times daily with a meal. 180 tablet 3  . Cholecalciferol (VITAMIN D3) 5000 units TABS Take by mouth.    . losartan-hydrochlorothiazide (HYZAAR) 100-25 MG tablet Take 1 tablet by mouth daily. 90 tablet 3  . Magnesium 250 MG TABS Take 30 mg by mouth daily.    . modafinil (PROVIGIL) 100 MG tablet Allowed for prn use, when excessively daytime sleepy. 30 tablet 5  . Multiple Vitamin (MULTIVITAMIN WITH MINERALS) TABS tablet Take 1 tablet by mouth daily.    . sildenafil (REVATIO) 20 MG tablet Take 20 mg by mouth 3 (three) times daily.    . simvastatin (ZOCOR) 20 MG tablet Take 1 tablet (20 mg total) by mouth daily. 90 tablet 2  . Testosterone (FORTESTA) 10 MG/ACT (2%) GEL Place 40 mg onto the skin daily.     Marland Kitchen topiramate (TOPAMAX) 25 MG tablet Pt takes 12.5 mg daily  12  . Vilazodone HCl (VIIBRYD) 40 MG TABS Take 40  mg by mouth daily.     Current Facility-Administered Medications  Medication Dose Route Frequency Provider Last Rate Last Dose  . 0.9 %  sodium chloride infusion  500 mL Intravenous Once Armbruster, Carlota Raspberry, MD        Allergies as of 03/03/2018  . (No Known Allergies)    Vitals: BP 129/79   Pulse (!) 55   Ht 6' (1.829 m)   Wt 267 lb (121.1 kg)   BMI 36.21 kg/m  Last Weight:  Wt Readings from Last 1 Encounters:  03/03/18 267 lb (121.1 kg)   ZOX:WRUE mass index is 36.21 kg/m.     Last Height:   Ht Readings from Last 1 Encounters:  03/03/18 6' (1.829 m)  Physical exam:  General: The patient is awake, alert and appears not in acute distress. The patient is well groomed. Head: Normocephalic, atraumatic. Neck is supple. Mallampati 3  neck circumference:17.5 . Nasal airflow patent, TMJ click  is evident .  Biological teeth on top, bottom dentures. Full Facial hair  Cardiovascular:  Regular rate and rhythm, without  murmurs or carotid bruit, and without distended neck veins. Respiratory: Lungs are clear to auscultation. RR 16/min.  Skin:  Without evidence of edema, or rash Trunk: BMI is 37.5 from 38.  Neurologic exam :  Cranial nerves: NO change in TASTE AND SMELL since. Pupils are equal and briskly reactive to light.  Extraocular movements  in vertical and horizontal planes intact and without nystagmus. Visual fields by finger perimetry are intact. Hearing to finger rub intact.  Facial sensation intact to fine touch. Facial motor strength is symmetric and tongue and uvula move midline. Shoulder shrug was symmetrical.  Motor exam:  Normal tone, muscle bulk and symmetric strength in all extremities. Remaining loss of grip strength, but full ROM.   Sensory:  Fine touch, pinprick and vibration were normal in hands and feet. . Coordination:Finger-to-nose maneuver  normal . Gait and station: Patient walks without assistive device. Turns with 3 Steps.  Deep tendon reflexes: in the   upper and lower extremities are symmetric and intact. Babinski maneuver deferred.    Assessment:  After physical and neurologic examination, review of laboratory studies,  Personal review of imaging studies, reports of other /same  Imaging studies, results of polysomnography and / or neurophysiology testing and pre-existing records as far as provided in visit., my assessment is   1) highly compliant BiPAP user. 100% - for complex  OSA on CPAP , severe OSA confirmed in SPLIT night PSG from 06-17-2017, now on BiPAP 20/ 17cm with good results but high air leak. Needs to change his FFM to a medium size ( according to him ) but with facial hair  He likely need to use a nasal pillow.  Marland Kitchen    2) EDS- Epworth now 22 f/24  after compliant use of BiPAP use for 180 days - Narcolepsy? Depression?  the patient will be invited for a PSG on CPAP and followed by MSLT- Has to wean off ViBryid and Bupropion and cannot do this now-   He reported isolated events of sleep paralysis, no dream hallucinations. No cataplexy.  The HLA narcolepsy blood test returned  negative .   Will offer modafinil- he is FDA approved as OSA patient with persistent hypersomnia.  Cardiologist input desired- HTN and  Component 13moago  HLA-DQ ALPHA Negative   HLA-DQ BETA Negative   Comment: Final Results:  DQA1*01:HXJ,05:CMM  DQB1*03:AXZVB,06:AURFZ  Code Translation:     .    The patient was advised of the nature of the diagnosed disorder , the treatment options and the  risks for general health and wellness arising from not treating the condition.   I spent more than 55 minutes of face to face time with the patient.  Greater than 50% of time was spent in counseling and coordination of care. We have discussed the diagnosis and differential and I answered the patient's questions.    Plan:  Treatment plan and additional workup : I like for Mr. GShenkerto continue using his BiPAP machine as currently set.  I adjusted his humidifier  today as the machine runs dry in the early morning hours wakes him up. I will also ask aero care to  have tentative nasal pillow P 10 and medium available for him.  EDS: Modafinil 100 mg for PRN use, driving etc. Refilled today  . He needs to follow with MSLT as ordered.   He has a temporal headache, not sleep related- behind the right eye- ice pick headaches.  After physical strain. No tearing but reddened eye. SUNCT ?    \ Larey Seat, MD 11/26/8577, 0:79 PM  Certified in Neurology by ABPN Certified in [place] by [facility] [address] [place], [place] [address]    Cc Lenna Sciara at [place], [place]

## 2018-03-03 NOTE — Patient Instructions (Addendum)
Preparation for MSLT daytime test for excessive sleepiness.  Not to take Modafinil for 8 days prior to test, not to take Vibryd for 21 days prior to test. You already indicated that you no longer take Wellbutrin.       Modafinil tablets What is this medicine? MODAFINIL (moe DAF i nil) is used to treat excessive sleepiness caused by certain sleep disorders. This includes narcolepsy, sleep apnea, and shift work sleep disorder. This medicine may be used for other purposes; ask your health care provider or pharmacist if you have questions. COMMON BRAND NAME(S): Provigil What should I tell my health care provider before I take this medicine? They need to know if you have any of these conditions: -history of depression, mania, or other mental disorder -kidney disease -liver disease -an unusual or allergic reaction to modafinil, other medicines, foods, dyes, or preservatives -pregnant or trying to get pregnant -breast-feeding How should I use this medicine? Take this medicine by mouth with a glass of water. Follow the directions on the prescription label. Take your doses at regular intervals. Do not take your medicine more often than directed. Do not stop taking except on your doctor's advice. A special MedGuide will be given to you by the pharmacist with each prescription and refill. Be sure to read this information carefully each time. Talk to your pediatrician regarding the use of this medicine in children. This medicine is not approved for use in children. Overdosage: If you think you have taken too much of this medicine contact a poison control center or emergency room at once. NOTE: This medicine is only for you. Do not share this medicine with others. What if I miss a dose? If you miss a dose, take it as soon as you can. If it is almost time for your next dose, take only that dose. Do not take double or extra doses. What may interact with this medicine? Do not take this medicine with any  of the following medications: -amphetamine or dextroamphetamine -dexmethylphenidate or methylphenidate -medicines called MAO Inhibitors like Nardil, Parnate, Marplan, Eldepryl -pemoline -procarbazine This medicine may also interact with the following medications: -antifungal medicines like itraconazole or ketoconazole -barbiturates like phenobarbital -birth control pills or other hormone-containing birth control devices or implants -carbamazepine -cyclosporine -diazepam -medicines for depression, anxiety, or psychotic disturbances -phenytoin -propranolol -triazolam -warfarin This list may not describe all possible interactions. Give your health care provider a list of all the medicines, herbs, non-prescription drugs, or dietary supplements you use. Also tell them if you smoke, drink alcohol, or use illegal drugs. Some items may interact with your medicine. What should I watch for while using this medicine? Visit your doctor or health care professional for regular checks on your progress. The full effects of this medicine may not be seen right away. This medicine may affect your concentration, function, or may hide signs that you are tired. You may get dizzy. Do not drive, use machinery, or do anything that needs mental alertness until you know how this drug affects you. Alcohol can make you more dizzy and may interfere with your response to this medicine or your alertness. Avoid alcoholic drinks. Birth control pills may not work properly while you are taking this medicine. Talk to your doctor about using an extra method of birth control. It is unknown if the effects of this medicine will be increased by the use of caffeine. Caffeine is available in many foods, beverages, and medications. Ask your doctor if you should limit or change  your intake of caffeine-containing products while on this medicine. What side effects may I notice from receiving this medicine? Side effects that you should  report to your doctor or health care professional as soon as possible: -allergic reactions like skin rash, itching or hives, swelling of the face, lips, or tongue -anxiety -breathing problems -chest pain -fast, irregular heartbeat -hallucinations -increased blood pressure -redness, blistering, peeling or loosening of the skin, including inside the mouth -sore throat, fever, or chills -suicidal thoughts or other mood changes -tremors -vomiting Side effects that usually do not require medical attention (report to your doctor or health care professional if they continue or are bothersome): -headache -nausea, diarrhea, or stomach upset -nervousness -trouble sleeping This list may not describe all possible side effects. Call your doctor for medical advice about side effects. You may report side effects to FDA at 1-800-FDA-1088. Where should I keep my medicine? Keep out of the reach of children. This medicine can be abused. Keep your medicine in a safe place to protect it from theft. Do not share this medicine with anyone. Selling or giving away this medicine is dangerous and against the law. This medicine may cause accidental overdose and death if taken by other adults, children, or pets. Mix any unused medicine with a substance like cat litter or coffee grounds. Then throw the medicine away in a sealed container like a sealed bag or a coffee can with a lid. Do not use the medicine after the expiration date. Store at room temperature between 20 and 25 degrees C (68 and 77 degrees F). NOTE: This sheet is a summary. It may not cover all possible information. If you have questions about this medicine, talk to your doctor, pharmacist, or health care provider.  2018 Elsevier/Gold Standard (2014-04-26 15:34:55)

## 2018-03-09 ENCOUNTER — Ambulatory Visit
Admission: RE | Admit: 2018-03-09 | Discharge: 2018-03-09 | Disposition: A | Discharge status: Home / Self Care | Payer: TRICARE For Life (TFL) | Source: Ambulatory Visit | Attending: Internal Medicine | Admitting: Internal Medicine

## 2018-03-09 DIAGNOSIS — Z87891 Personal history of nicotine dependence: Secondary | ICD-10-CM

## 2018-03-09 DIAGNOSIS — K449 Diaphragmatic hernia without obstruction or gangrene: Secondary | ICD-10-CM | POA: Diagnosis not present

## 2018-03-09 IMAGING — CT CT CHEST W/O CM
1 series · 15 of 34 positions shown, 19 images · non-contrast
Comparison: [HOSPITAL] cardiac CTA [DATE].

CLINICAL DATA: 66-year-old male with history of smoking.

EXAM:
CT CHEST WITHOUT CONTRAST
TECHNIQUE: Multidetector CT imaging of the chest was performed following the
standard protocol without IV contrast.

[Series 2: chest w/(date) · axial · 0.80mm/px · z∈[-376,-104]mm · 15 of 160 slices shown, 19 images]
[im 12/160  mediastinal]
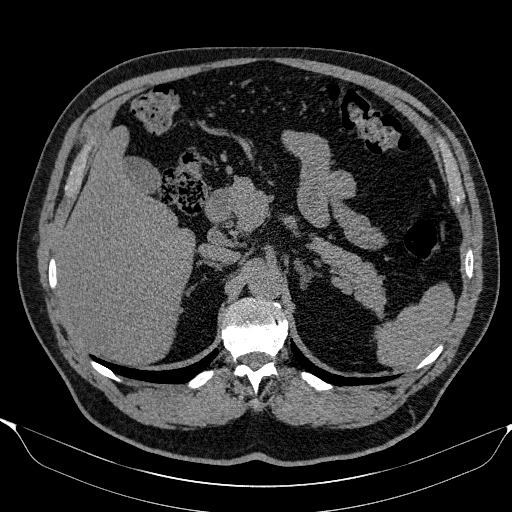
[im 12/160  lung]
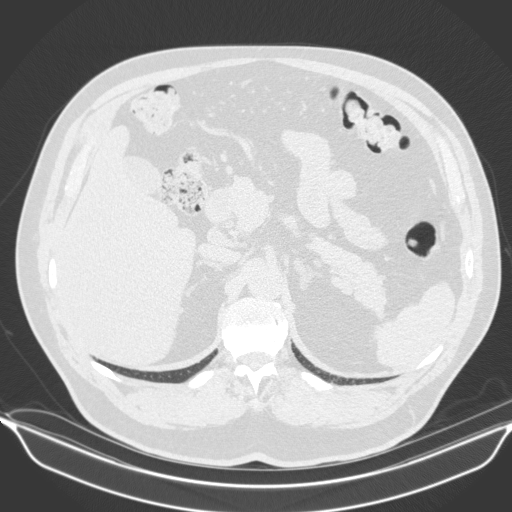
[im 24/160  lung]
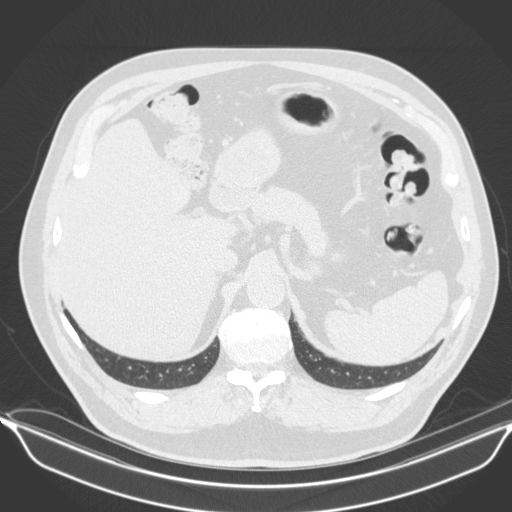
[im 32/160  lung]
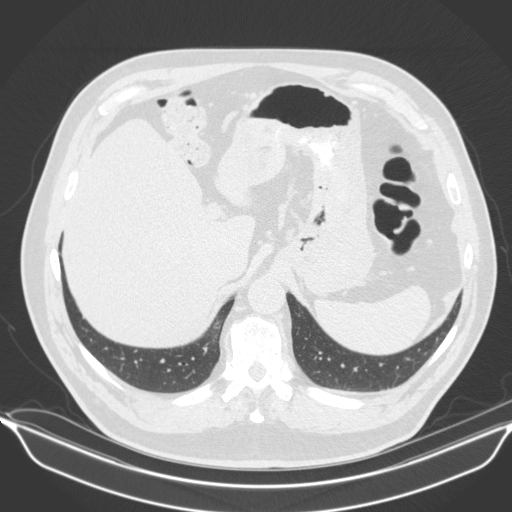
[im 42/160  lung]
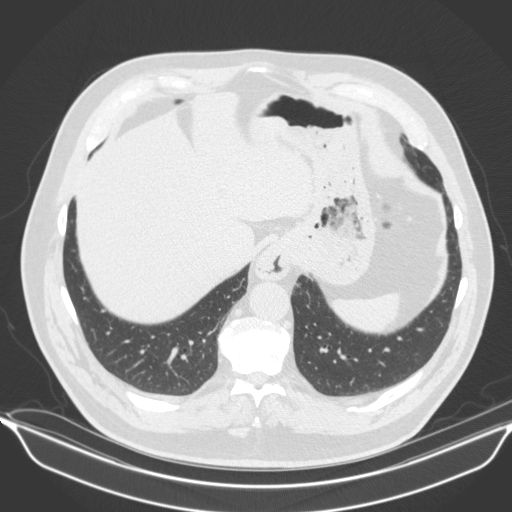
[im 54/160  mediastinal]
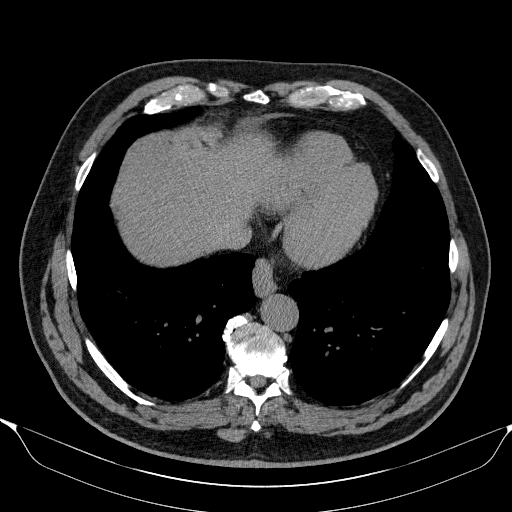
[im 54/160  lung]
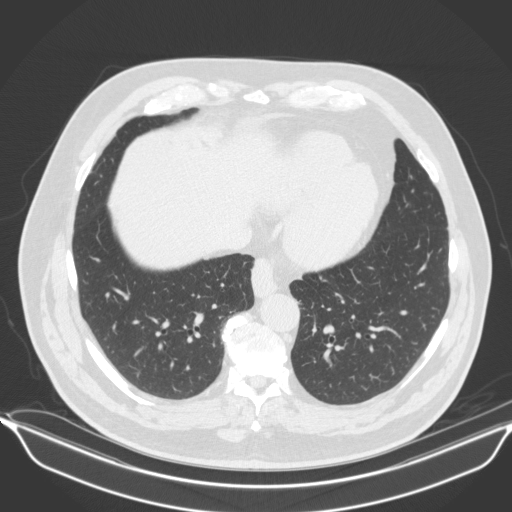
[im 64/160  lung]
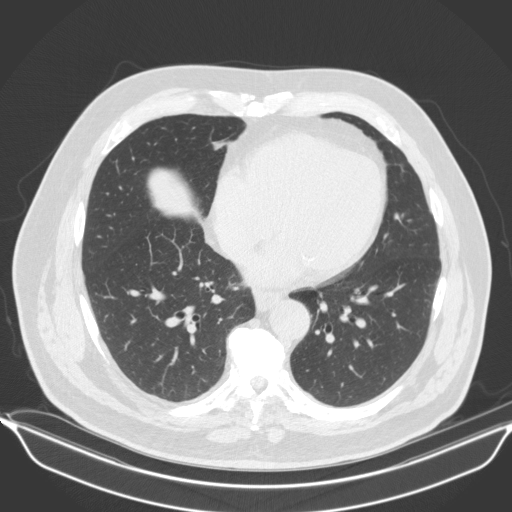
[im 71/160  lung]
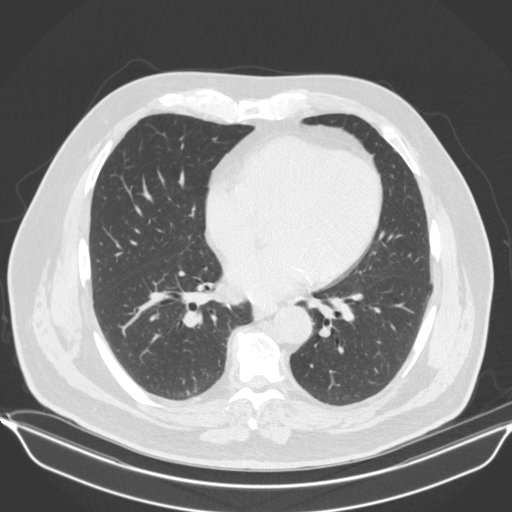
[im 83/160  lung]
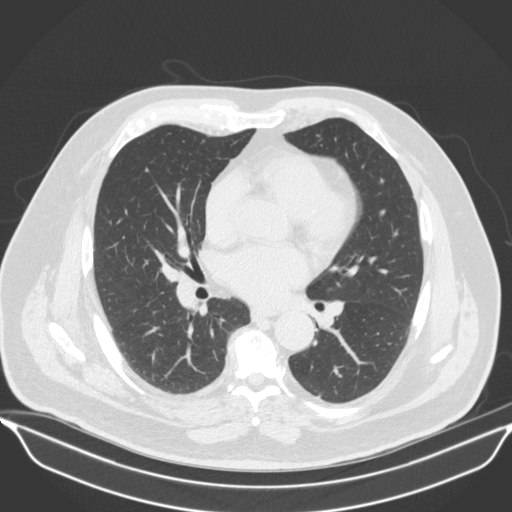
[im 89/160  mediastinal]
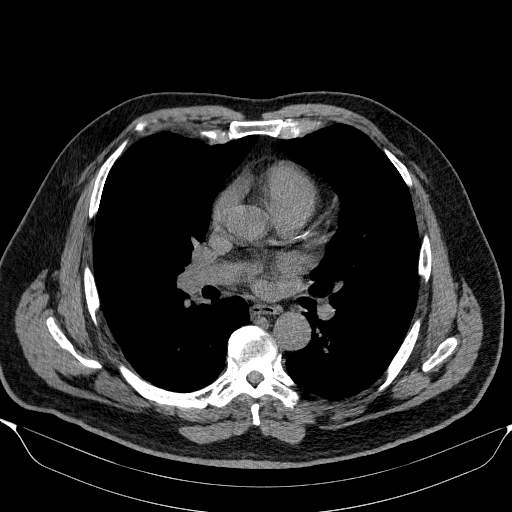
[im 89/160  lung]
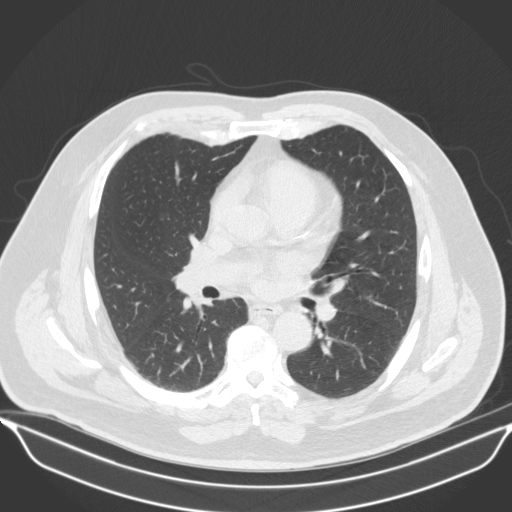
[im 96/160  lung]
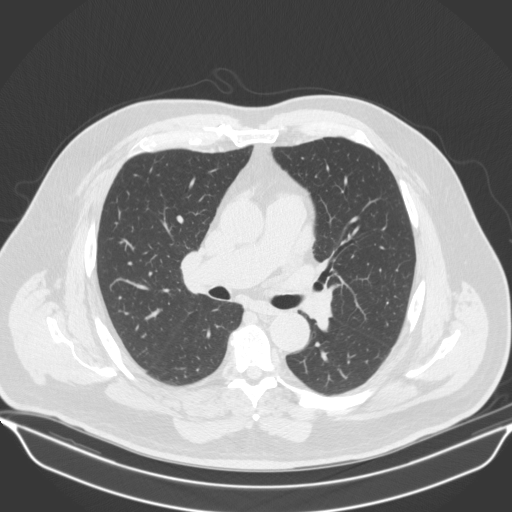
[im 107/160  lung]
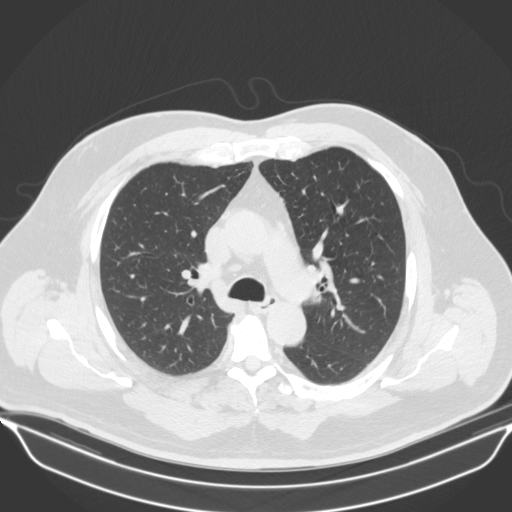
[im 118/160  lung]
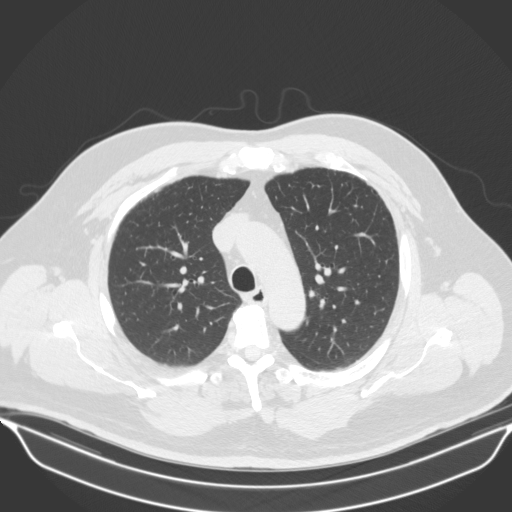
[im 128/160  mediastinal]
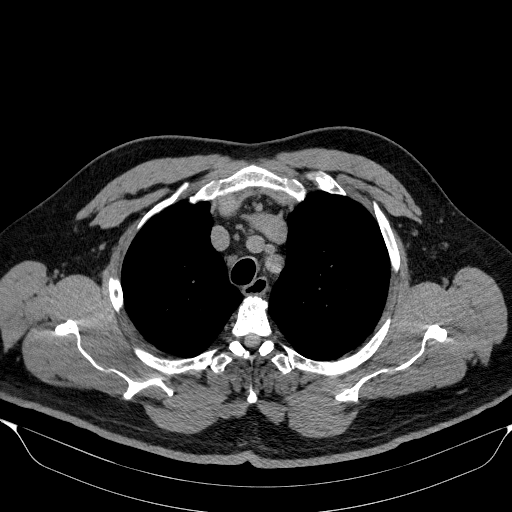
[im 128/160  lung]
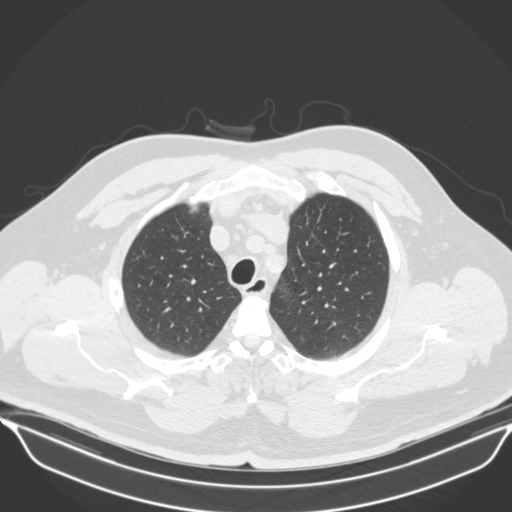
[im 136/160  lung]
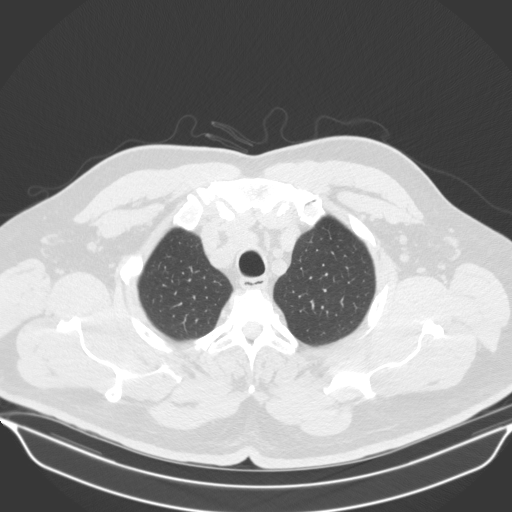
[im 148/160  lung]
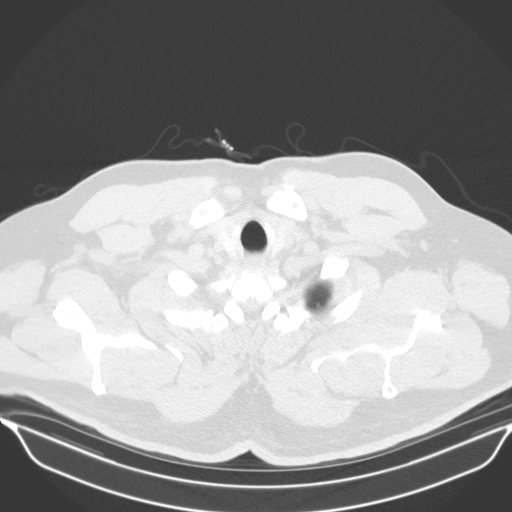

[15 of 34 positions shown; findings below may reference images not displayed]

FINDINGS: Cardiovascular: Calcified coronary artery atherosclerosis with
minimal visible aortic calcified plaque. Vascular patency is not
evaluated in the absence of IV contrast. No cardiomegaly or
pericardial effusion.

Mediastinum/Nodes: Small gastric hiatal hernia is stable. Otherwise
negative, no mediastinal adenopathy.

Lungs/Pleura: Major airways are patent. Larger lung volumes compared
to [E7]. There is minimal anterior upper lobe subpleural reticular
opacity compatible with mild scarring. Superimposed minor lower lobe
dependent scarring or atelectasis. No pulmonary nodule or suspicious
lung opacity. No pleural effusion.

Upper Abdomen: Negative visible noncontrast liver, gallbladder,
spleen, pancreas, adrenal glands and renal upper poles. Aside from
the small gastric hiatal hernia negative visible bowel the upper
abdomen.

Musculoskeletal: Lower thoracic spine disc and endplate
degeneration. Bilateral sternoclavicular joint degeneration. No
acute osseous abnormality identified.
IMPRESSION: 1. No acute findings in the chest; no pulmonary nodule identified.
Minimal scarring.
2. Mild calcified coronary artery atherosclerosis.
3. Small gastric hiatal hernia.

## 2018-03-30 ENCOUNTER — Ambulatory Visit (INDEPENDENT_AMBULATORY_CARE_PROVIDER_SITE_OTHER): Payer: Medicare Other | Admitting: Neurology

## 2018-03-30 DIAGNOSIS — R0689 Other abnormalities of breathing: Secondary | ICD-10-CM

## 2018-03-30 DIAGNOSIS — G4719 Other hypersomnia: Secondary | ICD-10-CM

## 2018-03-30 DIAGNOSIS — G4733 Obstructive sleep apnea (adult) (pediatric): Secondary | ICD-10-CM | POA: Diagnosis not present

## 2018-03-31 ENCOUNTER — Other Ambulatory Visit: Payer: Self-pay | Admitting: Neurology

## 2018-03-31 ENCOUNTER — Ambulatory Visit (INDEPENDENT_AMBULATORY_CARE_PROVIDER_SITE_OTHER): Payer: Medicare Other | Admitting: Neurology

## 2018-03-31 DIAGNOSIS — G47411 Narcolepsy with cataplexy: Secondary | ICD-10-CM

## 2018-03-31 DIAGNOSIS — G4753 Recurrent isolated sleep paralysis: Secondary | ICD-10-CM

## 2018-03-31 DIAGNOSIS — R0689 Other abnormalities of breathing: Secondary | ICD-10-CM

## 2018-03-31 DIAGNOSIS — F518 Other sleep disorders not due to a substance or known physiological condition: Secondary | ICD-10-CM

## 2018-03-31 DIAGNOSIS — G4719 Other hypersomnia: Secondary | ICD-10-CM

## 2018-03-31 DIAGNOSIS — G4733 Obstructive sleep apnea (adult) (pediatric): Secondary | ICD-10-CM

## 2018-03-31 NOTE — Addendum Note (Signed)
Addended by: Inis Sizer D on: 03/31/2018 11:14 AM   Modules accepted: Orders

## 2018-04-01 ENCOUNTER — Telehealth: Payer: Self-pay | Admitting: Neurology

## 2018-04-01 MED ORDER — MODAFINIL 200 MG PO TABS: 200.0000 mg | ORAL_TABLET | Freq: Every day | ORAL | 0 refills | Status: DC

## 2018-04-01 NOTE — Procedures (Signed)
  Name:  Ashwath, Lasch Reference [id number]  Study Date: 03/31/2018 Procedure #: [id number]  DOB: 1951-03-06    Protocol  This is a 13 channel Multiple Sleep Latency Test comprised of 5 channels of EEG (T3-Cz, Cz-T4, F4-M1, C4-M1, O2-M1), 3 channels of Chin EMG, 4 channels of EOG and 1 channel for ECG.   All channels were sampled at 256hz .    This polysomnographic procedure is designed to evaluate (1) the complaint of excessive daytime sleepiness by quantifying the time required to fall asleep and (2) the possibility of narcolepsy by checking for abnormally short latencies to REM sleep.  Electrographic variables include EEG, EMG, EOG and ECG.  Patients are monitored throughout four or five 20-minute opportunities to sleep (naps) at two-hour intervals.  For each nap, the patient is allowed 20 minutes to fall asleep.  Once asleep, the patient is awakened after 15 minutes.  Between naps, the patient is kept as alert as possible.  A sleep latency of 20 minutes indicates that no sleep occurred.  Parametric Analysis  Total Number of Naps 4     NAP # Time of Nap  Sleep Latency (mins) REM Latency (mins) Sleep Time Percent Awake Time Percent  1 07:20 3.5 0 63 37   2 09:14 2 14.5 84  16   3 11:14 2 0 78  22   4 13:14 3 10.5 72  28   5 --- 0 0 ![place]  !Zero Divide    MSLT Summary of Naps  Mean Sleep Latency to First Four Naps: 2.6  Mean Sleep Latency to First Three Naps: 2.5  Mean Sleep Latency to First Two Naps: 2.8  Number of Naps with REM Sleep: 2    Results from Preceding PSG Study  Sleep Onset Time 22:01 Sleep Efficiency (%) 86.6  Rise Time 05:38 Sleep Latency (min) 10.5  Total Sleep Time (min) 405 REM Latency (min) 24.0            Name:  Glenford, Garis Reference #:  [id number]  Study Date: 03/31/2018 DOB: Apr 01, 1951    IMPRESSION:  1. This multiple sleep latency test reveals a mean sleep latency of 2.6 minutes with 2 sleep periods during which  REM sleep was recorded.   2. A total of 4 sleep periods were recorded.   3. This study was preceded by an overnight polysomnogram with a total sleep time (TST) of 405 minutes and a REM sleep latency of 24 minutes.       RECOMMENDATIONS: This MSLT study is abnormal. This study is consistent with hypersomnolence. This study, in the proper clinical scenario, is consistent with a diagnosis of narcolepsy.    I attest to having reviewed every epoch of the entire raw data recording prior to the issuance of this report in accordance with the Standards of the [place] of Sleep Medicine.     Larey Seat, M.D.    04-02-2018  Diplomat of the American Board of Psychiatry and Neurology and the [employer] of Sleep Medicine, Fellow of the AASM, AAN,  Market researcher of [employer] [facility] at [facility] [employer].

## 2018-04-01 NOTE — Procedures (Signed)
PATIENT'S NAME:  Alexander [name], Alexander [name] DOB:      1950/09/15      MR#:    [id number]     DATE OF RECORDING: 03/30/2018 REFERRING M.D.:  Hayden Rasmussen MD Study Performed:   Titration to CPAP HISTORY:  Pt here for night on PAP /titration study prior to MSLT study. This patient had compliantly used CPAP since 02-19-2012, 95% pressure of 9.4 cm and AHI of 4.8/h- and had still excessive daytime sleepiness endorsed the Epworth score at 19/24 points. We repeated a sleep study on 06-17-2017 at [place], the patient was titrated to BiPAP, and was currently compliant using BiPAP 20/17 cm water, most recently with a nasal mask, he has persistent sleep paralysis, vivid lucid dreams and still had endorsed the Epworth sore at 22 / 24.  The patient endorsed the Epworth Sleepiness Scale here at 19/24 points.   The patient's weight 267 pounds with a height of 72 (inches), resulting in a BMI of 36.1 kg/m2. The patient's neck circumference measured 17.5 inches.  CURRENT MEDICATIONS: Aspirin, B-complex, Bupropion, Calcium, Carvedilol, Cholecalciferol, Lisinopril, Magnesium, Multi-Vitamin, Simvastatin, Testosterone, Topiramate and Vilazodone.  PROCEDURE:  This is a multichannel digital polysomnogram utilizing the SomnoStar 11.2 system.  Electrodes and sensors were applied and monitored per AASM Specifications.   EEG, EOG, Chin and Limb EMG, were sampled at 200 Hz.  ECG, Snore and Nasal Pressure, Thermal Airflow, Respiratory Effort, CPAP Flow and Pressure, Oximetry was sampled at 50 Hz. Digital video and audio were recorded.      CPAP was initiated at 12 cmH20 with heated humidity per AASM split night standards and pressure was advanced to 12/12cmH20 because of hypopneas, apneas and desaturations.  At a PAP pressure of 12 cmH20, there was a reduction of the AHI to 0.3 with improvement of sleep apnea. Lights Out was at 21:51 and Lights On at 05:38. Total recording time (TRT) was 467.5 minutes, with a total sleep time  (TST) of 405 minutes. The patient's sleep latency was 14.5 minutes. REM latency was 24 minutes. The sleep efficiency was 86.6 %.    SLEEP ARCHITECTURE: WASO (Wake after sleep onset) was 46 minutes.  There were 26.5 minutes in Stage N1, 123 minutes Stage N2, 88.5 minutes Stage N3 and 167 minutes in Stage REM.  The percentage of Stage N1 was 6.5%, Stage N2 was 30.4%, Stage N3 was 21.9% and Stage R (REM sleep) was 41.2%. The sleep architecture was notable for early REM onset. The arousals were noted as: 55 were spontaneous, 0 were associated with PLMs, and 0 were associated with respiratory events.  RESPIRATORY ANALYSIS:  There was a total of 2 respiratory events: 2 hypopneas with a hypopnea index of 0.3/hour. The total APNEA/HYPOPNEA INDEX (AHI) was 0.3 /hour and the total RESPIRATORY DISTURBANCE INDEX was 0.3/hour  2 events occurred in REM sleep and 0 events in NREM. The REM AHI was 0.7 /hour versus a non-REM AHI of 0.0/hour.  The patient spent 243 minutes of total sleep time in the supine position and 162 minutes in non-supine. The supine AHI was 0.5, versus a non-supine AHI of 0.0.  OXYGEN SATURATION & C02:  The baseline 02 saturation was 95%, with the lowest being 85%. Time spent below 89% saturation equaled 10 minutes.  PERIODIC LIMB MOVEMENTS:  The patient had a total of 0 Periodic Limb Movements. The Periodic Limb Movement (PLM) index was 0 and the PLM Arousal index was 0 /hour. EKG was in keeping with normal sinus rhythm (NSR). Post-study, the  patient indicated that sleep was the same as usual.  The patient was fitted with a [employer]ics DreamWear medium size mask- nasal mask.   DIAGNOSIS Patient's AHI on CPAP of 12 cm water was successfully reduced to less than 1/h.  The patient had been using auto CPAP and BiPAP compliantly before, but had continued to endorse excessive daytime sleepiness.   PLANS/RECOMMENDATIONS: MSLT to follow.   A follow up appointment will be scheduled in the Sleep  Clinic at [facility].   Please call [phone] with any questions.     I certify that I have reviewed the entire raw data recording prior to the issuance of this report in accordance with the Standards of Accreditation of the American Academy of Sleep Medicine (AASM)    Larey Seat, M.D.   04-01-2018 Diplomat, American Board of Psychiatry and Neurology  Diplomat, [place] of Sleep Medicine Medical Director, [place] [facility] at [facility]

## 2018-04-01 NOTE — Telephone Encounter (Signed)
-----   Message from Larey Seat, MD sent at 04/01/2018  1:12 PM EDT ----- Diagnostic MSLT for Narcolepsy- positive . Treatment with modafinil/ Xyrem will be discussed in follow up.  Positive airway pressure will be set to autotitration 8-12 cm water,  2 cm water EPR , nasal interface DreamWear in medium size.

## 2018-04-01 NOTE — Addendum Note (Signed)
Addended by: Larey Seat on: 04/01/2018 01:12 PM   Modules accepted: Orders

## 2018-04-01 NOTE — Telephone Encounter (Signed)
Called and spoke with the patient about his sleep study results. Informed him that we were able to treat the patients apnea with cpap vs the Bipap. Dr Dohmeier is recommended to decrease his machine to CPAP settings. Informed the patient that I will send the order for him to [facility] to have them make that adjustment. I then went over the daytime test with the patient and informed him the test was positive for narcolepsy. Patient will start on modafinil 200 mg once a day in the am and we will schedule a follow up visit on Nov 19,2019 at 8:30 am to assess the machine changes and medication treatment plan. Pt verbalized understanding. Asked that I [facility] the [facility]. Pt verbalized understanding. Pt had no questions at this time but was encouraged to call back if questions arise.

## 2018-04-06 LAB — COMPREHENSIVE DRUG ANALYSIS,UR

## 2018-06-16 DIAGNOSIS — H35033 Hypertensive retinopathy, bilateral: Secondary | ICD-10-CM | POA: Diagnosis not present

## 2018-07-01 ENCOUNTER — Encounter: Payer: Self-pay | Admitting: Neurology

## 2018-07-07 ENCOUNTER — Encounter: Payer: Self-pay | Admitting: Neurology

## 2018-07-07 ENCOUNTER — Ambulatory Visit (INDEPENDENT_AMBULATORY_CARE_PROVIDER_SITE_OTHER): Payer: Medicare Other | Admitting: Neurology

## 2018-07-07 VITALS — BP 147/88 | HR 68 | Ht 72.0 in | Wt 270.0 lb

## 2018-07-07 DIAGNOSIS — Z8679 Personal history of other diseases of the circulatory system: Secondary | ICD-10-CM

## 2018-07-07 DIAGNOSIS — G4719 Other hypersomnia: Secondary | ICD-10-CM

## 2018-07-07 DIAGNOSIS — G47419 Narcolepsy without cataplexy: Secondary | ICD-10-CM | POA: Diagnosis not present

## 2018-07-07 DIAGNOSIS — Z9989 Dependence on other enabling machines and devices: Secondary | ICD-10-CM | POA: Diagnosis not present

## 2018-07-07 DIAGNOSIS — G4733 Obstructive sleep apnea (adult) (pediatric): Secondary | ICD-10-CM | POA: Diagnosis not present

## 2018-07-07 DIAGNOSIS — G4753 Recurrent isolated sleep paralysis: Secondary | ICD-10-CM | POA: Diagnosis not present

## 2018-07-07 NOTE — Progress Notes (Signed)
SLEEP MEDICINE CLINIC   Provider:  Larey Seat, MD   Primary Care Physician:  Alexander Battles, MD   Referring Provider: Leanna Battles, MD   Chief Complaint  Patient presents with  . Follow-up    pt alone, rm 11. pt states that the machine is working well. he had some questions and brought in both masks to talk about the mask.  He has a temporal headache, not sleep related- behind the right eye- ice pick headaches.   He is excessively daytime sleepy while compliant on BiPAP and reports cataplectic attacks, sleep paralysis and vivid, lucid dreams about suffocating" .    HPI:  Alexander [name] is a 67 y.o. male , seen here as in a referral  from Dr. Philip [name],  a caucasian right handed married male patient from [place]. Alexander [name] carries a diagnosis of sleep apnea as well as hypertension, narcolepsy, myopathy, possibly neuropathy and vasovagal syncope. Further listed are arthritis, hypercholesterolemia,  depression-anxiety, degenerative disc disease, and heart disease/ cardiomyopathy- with an EF of < 50%. He failed a tilt table test. The patient is status post tonsillectomy. He was diagnosed with sleep apnea in the year 10/07/97, and was placed on CPAP. He is continued to use CPAP his last sleep study was 5 years ago in [place], performed on 02/19/2012 at [facility]. The final impression was the presence of severe sleep apnea and snoring the patient had 222 obstructive apneas, 12 centrals and 25 mixed apneas and 14 hypopneas.  During REM sleep his AHI was 25.8 per hour, total AHI was 47.7 per hour. The patient has been happy with his machine which is now 67 years old and not as reliable, it is also difficult to travel with. I was able to see a compliance download today, the patient has used the machine 30 out of 30 days with a compliance of over 4 hours for 96.7% of the time this is an auto CPAP mean pressure is 9.4 cm water and average device pressure is 13 cm water  with an average AHI of 4.8, which is a sufficient reduction.  Sleep habits are as follows: Alexander [name] usual bedtime is around 11 PM, he retreats to his bedroom, which is cool, quiet and dark. He shares a bedroom with his wife, he prefers right lateral sleep position with one pillow. He is usually asleep promptly and stays asleep for 4-5 hours before he first wakes usually to urinate. Nocturia is usually only once at night. Most nights he can continue to sleep until 7:30 and wakes up spontaneously. He used to wake up with headaches in the morning, but the headaches did not wake him. This has been reduced since he was placed on topiramate by Dr. Philip [name]. He only takes 25 mg at bedtime.  Sleep medical history and family sleep history: See above. Sleep apnea did affect father , who died in 10-07-80, without knowledge of the diagnosis. Had DM , too.   Social history: married, originally from [place], lived in [place], [place], now in [place]. Son is 51 , daughter is 63, with 3 grand-children.  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 . Retired 1/ 2016.He quit smoking in Oct 07, 1994, drinks about 8 ounces of alcohol a week in form of wine. Caffeine use in form of coffee, 3-4 cups a day, 3 caffeinated sodas a day, no ice tea or energy drinks.  Interval history from 03 September 2017, I have the pleasure of meeting with  Alexander [name] after his recent sleep study, he underwent a split-night polysomnography on 17 June 2017 which confirmed that he still has sleep apnea he had endorsed a very high degree of daytime sleepiness at 24 points prior to the study he had 222 obstructive apneas and 12 central apneas during REM sleep the AHI was 25.8 but his total AHI was 46.4/h, placing him into the severe category of sleep apnea.  Supine AHI was 63.1. CPAP was initiated at 5 cm and explored all the way up to a CPAP pressure of 18 cm water but the AHI remained in the high double digits.  The technologist  changed the patient to BiPAP first initiated at 18/15 centimeters water and advance step-by-step to 20/17 there was then a reduction of the AHI of 0.0 . There were also no longer periodic limb movements of significance, persistent hypertension with a systolic blood pressure of 160 mmHg was noted at the beginning of the study and we ordered a BiPAP of 20/17 cmH2O .  Today is our first compliance visit, the patient has been 100% compliant with at 6 hours 30 minutes average use of time at night the machine is indeed set to 20/17 cmH2O, residual AHI is 1.2 which is an excellent resolution but his 95th percentile air leak is 84.2 L. - initially fitted with a nasal pillow the so-called P 10 by [employer] which he exchanged later full face mask F 10 .  He states the current mask is large ,he would need a medium size , but he also has facial hair and the air leak is very high.  I do think he may be best off returning to a nasal pillow but having a F10 mask in medium size available should the nostrils get irritated. While I do not need to change the settings I will order change in his mask.  We will try the F fullface mask 10 mm size and I would like for him to have a nasal pillow also available in medium size.  Today's 03 March 2018 and I have the pleasure of meeting Alexander [name] again, a 67 year old Caucasian gentleman who has been using CPAP with limited success and was changed to BiPAP.  He is currently using 20/17 centimeters water pressure with a residual AHI of 3.8 800% compliance with an average use at time of 7 hours and 18 minutes.  There are minimal air leaks, the tidal volume seems to be stable from night to night with little variation.  In spite of the good control of apnea the patient endorsed the Epworth Sleepiness Scale at 22 points.  He is also feeling extremely fatigued.  He reported just having nodded off before I entered the exam room. He reports lucid dreams, sleep paralysis, breath holding  spells, depression (on Vibryyd), cataplexy.  07-07-2018, I have the pleasure of meeting today with [name] again a 67 year old Caucasian gentleman who had moved to [facility]  from [place] and presented  for follow-up care on his obstructive sleep apnea.  He had for 5.5  years used a BiPAP and now needed to qualify for a new machine.  He also was persistently and excessively daytime sleepy in spite of being a compliant use of BiPAP.   On 30 March 2018 he underwent a titration study in order to follow with an M SLT.  The titration study showed that he responded positively to simple CPAP, not BiPAP, this is an AHI of 0.3/h using 12 cmH2O pressure.  He was fitted with a Respironics DreamWear mask and medium size he is using the nasal cradle model.  The study was valid for an M SLT to follow remarkable was that he had a short REM latency of only 27 minutes.  The M SLT that followed showed 4 naps with a mean sleep latency of only 2.6 minutes, documenting a significant hypersomnia.  2 of the naps also had early REM onset and in combination with his short REM latency during the nighttime study before this is diagnostic for narcolepsy.  The patient therefore received 2 prescriptions one for modafinil and 1 for a CPAP machine.  He has been doing very well with his CPAP machine and armodafinil his Epworth sleepiness score has decreased from 19-15 points.  He has been 100% compliant uses an AutoSet between 8 and 12 cmH2O pressure was 2 cm EPR his residual AHI is 1.6, there are no major air leaks in the 95th percentile pressure is 10.4 cmH2O valve is in the range of his current AutoSet.  There were no Cheyne-Stokes respiration noted and given the excellent numbers and clinical results this machine will now be permanently fix.  He is currently using a Investment banker, operational.  I also will refill the modafinil.   Review of Systems: Out of a complete 14 system review, the patient complains of only the following  symptoms, and all other reviewed systems are negative.  Fatigue, palpitations, ankle and leg swelling, tinnitus, vertigo, trouble swallowing, aching muscles, joint pain, shortness of breath, difficulties with dizziness, depression, anxiety and restless legs at times. He feels less depressed than last visit.  He has a temporal headache, not sleep related- behind the right eye- ice pick headaches.  These have much improved with CPAP !  Left eye was tearing. Left ear tinnitus.  Epworth reduced to 15 from 19/ 24 points 07-07-2018  Isolated sleep paralysis, isolated cataplectic events, dreams - vividly.   Last Epworth score before CPAP titration was  22/24  ,  Fatigue severity score  45 from 63 points , depression score 8/15    Social History   Socioeconomic History  . Marital status: Married    Spouse name: Not on file  . Number of children: Not on file  . Years of education: Not on file  . Highest education level: Not on file  Occupational History  . Not on file  Social Needs  . Financial resource strain: Not on file  . Food insecurity:    Worry: Not on file    Inability: Not on file  . Transportation needs:    Medical: Not on file    Non-medical: Not on file  Tobacco Use  . Smoking status: Former Smoker    Last attempt to quit: 1996    Years since quitting: 23.8  . Smokeless tobacco: Never Used  Substance and Sexual Activity  . Alcohol use: No  . Drug use: No  . Sexual activity: Not on file  Lifestyle  . Physical activity:    Days per week: Not on file    Minutes per session: Not on file  . Stress: Not on file  Relationships  . Social connections:    Talks on phone: Not on file    Gets together: Not on file    Attends religious service: Not on file    Active member of club or organization: Not on file    Attends meetings of clubs or organizations: Not on file    Relationship status:  Not on file  . Intimate partner violence:    Fear of current or ex partner: Not on  file    Emotionally abused: Not on file    Physically abused: Not on file    Forced sexual activity: Not on file  Other Topics Concern  . Not on file  Social History Narrative   He has been married for 36 years.     Son (born 75) -lives in [place]   Daughter (born 74) also lives in [place].   Retired from [employer].  Enjoys doing house remodeling.   Remote history of smoking, stopped in 1996.   Minimal alcohol use.    Family History  Problem Relation Age of Onset  . Cancer Mother        duodenal CA  . Heart disease Mother   . Depression Mother   . Stomach cancer Mother   . Cancer Father   . Hypertension Father   . High Cholesterol Father   . Heart disease Father   . Diabetes Father   . Hypertension Sister   . High Cholesterol Sister   . Heart disease Sister   . Esophageal cancer Neg Hx   . Rectal cancer Neg Hx   . Colon cancer Neg Hx     Past Medical History:  Diagnosis Date  . Allergy   . Cancer ([place])    skin cancer to upper chest/basal cell 2007  . Chronic headaches   . Chronic neck pain    DJD  . Chronic pain in right foot   . DDD (degenerative disc disease), lumbar   . Depression   . Dyslipidemia   . Erectile dysfunction   . Exogenous obesity   . GERD (gastroesophageal reflux disease)   . H/O acquired cardiomyopathy 1996   RESOLVED (apparently was thought to have had an MI, but did not have any intervention).  He developed cardiomyopathy that forced him to retire from [employer]..  The current EF 60-65%.  . History of fracture due to fall 1988   Bilateral wrist fractures  . Hyperlipidemia   . Hypertension   . Hypogonadism male   . IBS (irritable bowel syndrome)   . Obstructive sleep apnea   . Sleep apnea    cpap    Past Surgical History:  Procedure Laterality Date  . CARDIAC CATHETERIZATION  2000   Nonobstructive disease.  Persistently reduced EF  . CARPAL TUNNEL RELEASE Left   . COLONOSCOPY    . CORONARY CT ANGIOGRAM  07/2017    Coronary calcium score 0?.  Focal noncalcific plaque (30%) in proximal LAD.  Otherwise no significant CAD.  Normal aortic root (3.2 cm)  . HAMMER TOE SURGERY Right   . ingrown toenail surgery Bilateral    2017  . IR RADIOLOGY PERIPHERAL GUIDED IV START  07/25/2017  . IR US GUIDE VASC ACCESS RIGHT  07/25/2017  . MENISCUS REPAIR    . ORIF WRIST FRACTURE Bilateral   . PLANTAR FASCIA RELEASE Right   . SHOULDER ARTHROSCOPY Left   . TENDON REPAIR Right    right quad tendon repair  . Tilt Table Test     Noted to have vasovagal syncope  . TRANSTHORACIC ECHOCARDIOGRAM  06/21/2015   Normal LV function.  EF 57%.  Mild left atrial enlargement, trace AI, trace MR, mild PI.  Marland Kitchen TRANSTHORACIC ECHOCARDIOGRAM  06/2017   EF 60-65%.  Mild diastolic dysfunction.  No significant valvular abnormality.  Mild aortic calcification.    Current  Outpatient Medications  Medication Sig Dispense Refill  . aspirin 325 MG tablet Take 325 mg by mouth daily.     . B Complex Vitamins (VITAMIN B COMPLEX PO) Take 2,500 mcg by mouth daily.     . Calcium Carbonate (CALCIUM 600 PO) Take 1 tablet by mouth daily.    . carvedilol (COREG) 6.25 MG tablet Take 1 tablet (6.25 mg total) by mouth 2 (two) times daily with a meal. 180 tablet 3  . Cholecalciferol (VITAMIN D3) 5000 units TABS Take by mouth.    . losartan-hydrochlorothiazide (HYZAAR) 100-25 MG tablet Take 1 tablet by mouth daily. 90 tablet 3  . Magnesium 250 MG TABS Take 30 mg by mouth daily.    . modafinil (PROVIGIL) 200 MG tablet Take 1 tablet (200 mg total) by mouth daily. 90 tablet 0  . Multiple Vitamin (MULTIVITAMIN WITH MINERALS) TABS tablet Take 1 tablet by mouth daily.    . sildenafil (REVATIO) 20 MG tablet Take 20 mg by mouth 3 (three) times daily.    . simvastatin (ZOCOR) 20 MG tablet Take 1 tablet (20 mg total) by mouth daily. 90 tablet 2  . Testosterone (FORTESTA) 10 MG/ACT (2%) GEL Place 40 mg onto the skin daily.     Marland Kitchen topiramate (TOPAMAX) 25 MG tablet Pt  takes 12.5 mg daily  12   Current Facility-Administered Medications  Medication Dose Route Frequency Provider Last Rate Last Dose  . 0.9 %  sodium chloride infusion  500 mL Intravenous Once Armbruster, Carlota Raspberry, MD        Allergies as of 07/07/2018  . (No Known Allergies)    Vitals: BP (!) 147/88   Pulse 68   Ht 6' (1.829 m)   Wt 270 lb (122.5 kg)   BMI 36.62 kg/m  Last Weight:  Wt Readings from Last 1 Encounters:  07/07/18 270 lb (122.5 kg)   MGQ:QPYP mass index is 36.62 kg/m.     Last Height:   Ht Readings from Last 1 Encounters:  07/07/18 6' (1.829 m)    Physical exam:  General: The patient is awake, alert and appears not in acute distress. The patient is well groomed. Head: Normocephalic, atraumatic. Neck is supple. Mallampati 3  neck circumference:17.5 . Nasal airflow patent, TMJ click on the left jaw is evident.   Biological teeth on top, bottom dentures. Full Facial hair  Cardiovascular:  Regular rate and rhythm, without  murmurs or carotid bruit, and without distended neck veins. Respiratory: Lungs are clear to auscultation. RR 16/min.  Skin:  Without evidence of edema, or rash Trunk: BMI is 36.6 kg/m2  from 38.  Neurologic exam :  Cranial nerves: NO change in TASTE AND SMELL since last visit . Pupils are equal and briskly reactive to light.  Extraocular movements  in vertical and horizontal planes intact and without nystagmus. Dry eye. Visual fields by finger perimetry are intact.  Facial motor strength is symmetric and tongue and uvula move midline. Shoulder shrug was symmetrical.  Motor exam:  Normal tone, muscle bulk and symmetric strength in all extremities. Remaining loss of grip strength, but full ROM.   Sensory:  Fine touch, pinprick and vibration were normal in hands and feet.  Coordination:Finger-to-nose maneuver normal . Gait and station: Patient walks without assistive device. Turns still with 3 Steps.  Deep tendon reflexes: in the upper and lower  extremities are symmetric, brisk. Babinski maneuver down going.   Assessment:  After physical and neurologic examination, review of laboratory studies,  Personal  review of imaging studies, reports of other /same  Imaging studies, results of polysomnography and / or neurophysiology testing and pre-existing records as far as provided in visit., my assessment is:    1) highly compliant user of CPAP now auto machine for OSA on CPAP , He likes the new nasal cradle interface, dream wear medium.   2) Positive MSLT for narcolepsy after negative HLA test.    3) Residual EDS improved - Epworth now 15 /24 points  after compliant use of CPAP 8-12 cm water, 2 cm EPR , 1000% compliance and taking modafinil.     Component 39moago  HLA-DQ ALPHA Negative   HLA-DQ BETA Negative   Comment: Final Results:  DQA1*01:HXJ,05:CMM  DQB1*03:AXZVB,06:AURFZ  Code Translation:     .  Headache has improved, dry eye has progressed on his left, [place],  The patient was advised of the nature of the diagnosed disorder , the treatment options and the  risks for general health and wellness arising from not treating the condition.   I spent more than 25 minutes of face to face time with the patient.  Greater than 50% of time was spent in counseling and coordination of care. We have discussed the diagnosis and differential and I answered the patient's questions.    CPAP therapy will continue with auto machine.  EDS: Modafinil 100 mg for PRN use, driving etc. Refilled today. Rv in 12 month with me or NP.    CLarey Seat MD 109/47/0962 88:36AM  Certified in Neurology by ABPN Certified in SL[facility] [address] [place] [id number]   Cc MLenna Sciaraat [place]

## 2018-07-07 NOTE — Patient Instructions (Signed)
I believe you have a condition called narcolepsy: This means, that you have a sleep disorder that manifests with at times severe excessive sleepiness during the day and often with problems with sleep at night. We may have to try different medications that may help you stay awake during the day. Not everything works with everybody the same way. Wake promoting agents include stimulants and non-stimulant type medications. The most common side effects with stimulants are weight loss, insomnia, nervousness, headaches, palpitations, rise in blood pressure, anxiety. Stimulants can be addictive and subject to abuse. Non-stimulant type wake promoting medications include Provigil and Nuvigil, most common side effects include headaches, nervousness, insomnia, hypertension. In addition there is a medication called Xyrem which has been proven to be very effective in patients with narcolepsy with or without cataplexy. Some patients with narcolepsy report episodes of weakness, such as jaw or facial weakness, legs giving out, feeling wobbly or like "Jell-o", etc. in situations of anxiety, stress, laughter, sudden sadness, surprise, etc., which is called cataplexy. You can also experience episodes of sleep paralysis during which you may feel unable to move upon awakening. Some people experience dreamlike sequences upon awakening or upon drifting off to sleep, called hypnopompic or hypnagogic hallucinations.

## 2018-07-21 ENCOUNTER — Other Ambulatory Visit: Payer: Self-pay | Admitting: Neurology

## 2018-07-21 ENCOUNTER — Other Ambulatory Visit: Payer: Self-pay | Admitting: Cardiology

## 2018-07-21 MED ORDER — MODAFINIL 200 MG PO TABS: 200.0000 mg | ORAL_TABLET | Freq: Every day | ORAL | 1 refills | Status: DC

## 2018-07-27 ENCOUNTER — Ambulatory Visit: Payer: Medicare Other | Admitting: Cardiology

## 2018-08-20 DIAGNOSIS — E7849 Other hyperlipidemia: Secondary | ICD-10-CM | POA: Diagnosis not present

## 2018-08-20 DIAGNOSIS — R82998 Other abnormal findings in urine: Secondary | ICD-10-CM | POA: Diagnosis not present

## 2018-08-20 DIAGNOSIS — Z125 Encounter for screening for malignant neoplasm of prostate: Secondary | ICD-10-CM | POA: Diagnosis not present

## 2018-08-20 DIAGNOSIS — R8290 Unspecified abnormal findings in urine: Secondary | ICD-10-CM | POA: Diagnosis not present

## 2018-08-20 DIAGNOSIS — I10 Essential (primary) hypertension: Secondary | ICD-10-CM | POA: Diagnosis not present

## 2018-08-20 DIAGNOSIS — R7309 Other abnormal glucose: Secondary | ICD-10-CM | POA: Diagnosis not present

## 2018-08-27 ENCOUNTER — Ambulatory Visit: Payer: Medicare Other | Admitting: Cardiology

## 2018-08-27 DIAGNOSIS — R600 Localized edema: Secondary | ICD-10-CM | POA: Diagnosis not present

## 2018-08-27 DIAGNOSIS — N528 Other male erectile dysfunction: Secondary | ICD-10-CM | POA: Diagnosis not present

## 2018-08-27 DIAGNOSIS — D751 Secondary polycythemia: Secondary | ICD-10-CM | POA: Diagnosis not present

## 2018-08-27 DIAGNOSIS — E7849 Other hyperlipidemia: Secondary | ICD-10-CM | POA: Diagnosis not present

## 2018-08-27 DIAGNOSIS — G4733 Obstructive sleep apnea (adult) (pediatric): Secondary | ICD-10-CM | POA: Diagnosis not present

## 2018-08-27 DIAGNOSIS — I10 Essential (primary) hypertension: Secondary | ICD-10-CM | POA: Diagnosis not present

## 2018-08-27 DIAGNOSIS — Z6837 Body mass index (BMI) 37.0-37.9, adult: Secondary | ICD-10-CM | POA: Diagnosis not present

## 2018-08-27 DIAGNOSIS — Z1212 Encounter for screening for malignant neoplasm of rectum: Secondary | ICD-10-CM | POA: Diagnosis not present

## 2018-08-27 DIAGNOSIS — R7309 Other abnormal glucose: Secondary | ICD-10-CM | POA: Diagnosis not present

## 2018-08-27 DIAGNOSIS — E291 Testicular hypofunction: Secondary | ICD-10-CM | POA: Diagnosis not present

## 2018-08-27 DIAGNOSIS — Z0000 Encounter for general adult medical examination without abnormal findings: Secondary | ICD-10-CM | POA: Diagnosis not present

## 2018-08-27 DIAGNOSIS — Z1389 Encounter for screening for other disorder: Secondary | ICD-10-CM | POA: Diagnosis not present

## 2018-08-27 DIAGNOSIS — F3289 Other specified depressive episodes: Secondary | ICD-10-CM | POA: Diagnosis not present

## 2018-09-15 DIAGNOSIS — G5601 Carpal tunnel syndrome, right upper limb: Secondary | ICD-10-CM | POA: Diagnosis not present

## 2018-09-15 DIAGNOSIS — M47812 Spondylosis without myelopathy or radiculopathy, cervical region: Secondary | ICD-10-CM | POA: Diagnosis not present

## 2018-09-21 ENCOUNTER — Ambulatory Visit (INDEPENDENT_AMBULATORY_CARE_PROVIDER_SITE_OTHER): Payer: Medicare Other | Admitting: Cardiology

## 2018-09-21 ENCOUNTER — Encounter: Payer: Self-pay | Admitting: Cardiology

## 2018-09-21 VITALS — BP 140/82 | HR 66 | Ht 72.0 in | Wt 278.0 lb

## 2018-09-21 DIAGNOSIS — R55 Syncope and collapse: Secondary | ICD-10-CM

## 2018-09-21 DIAGNOSIS — I10 Essential (primary) hypertension: Secondary | ICD-10-CM

## 2018-09-21 DIAGNOSIS — Z8679 Personal history of other diseases of the circulatory system: Secondary | ICD-10-CM | POA: Diagnosis not present

## 2018-09-21 DIAGNOSIS — R0600 Dyspnea, unspecified: Secondary | ICD-10-CM

## 2018-09-21 DIAGNOSIS — R0609 Other forms of dyspnea: Secondary | ICD-10-CM

## 2018-09-21 DIAGNOSIS — G4733 Obstructive sleep apnea (adult) (pediatric): Secondary | ICD-10-CM

## 2018-09-21 MED ORDER — ASPIRIN EC 81 MG PO TBEC: 81.0000 mg | DELAYED_RELEASE_TABLET | Freq: Every day | ORAL | 3 refills | Status: DC

## 2018-09-21 NOTE — Assessment & Plan Note (Signed)
Back to CPAP after 8 month BiPAP (re-studied).

## 2018-09-21 NOTE — Assessment & Plan Note (Signed)
BP was high with PCP visit - Coreg up to 12.5 mg BID --> borderline today.  Will ask him to monitor -- reluctant to further titrate beta blocker due to HR in 60s.

## 2018-09-21 NOTE — Progress Notes (Signed)
PCP: Leanna Battles, MD  Clinic Note: Chief Complaint  Patient presents with  . Follow-up  . Hypertension    PCP just increaesd Coreg to 12.5 mg BID  . Leg Swelling    R leg > L    HPI: Alexander [name] is a 68 y.o. male with a PMH below who presents today for annual f/u for Cardiomyopathy (MI w/o PCI).   1996 - MI? / acquired Cardiomyopathy.  No PCI. Medically retired from [employer] --->   Current EF now 60-65% (06/2017)  07/2017 - Coronary CTA - Ca2+ score 0. pLAD focal ~30%.   [name] was last seen in January 2019 to discuss results of his coronary calcium score/coronary CTA and echocardiogram.  He noted that he was doing quite well at some tenderness to palpation along the subxiphoid process.  Only noted exertional dyspnea with strenuous exertion.  Has chronic right foot and ankle pain with swelling that limits his ability to exercise and walk..  Recent Hospitalizations:   None  Studies Personally Reviewed - (if available, images/films reviewed: From Epic Chart or Care Everywhere)  None  Interval History: [name] returns here today overall doing very well from a cardiac standpoint.  He has been stable with no major complaints.  He did have some increased blood pressure and palpitations saw his PCP recently increased his carvedilol to 12.5 mg twice daily. He still has his chronic right ankle edema that is relatively stable.  He said that his PCP recommended he use a support stocking on that leg.  He really has not been doing much exercise and notes that he is gained weight.  From a cardiac standpoint very stable:  No chest pain or shortness of breath with rest or exertion.  Only notes exertional dyspnea with strenuous exertion (noting weight gain and deconditioning)  No PND, orthopnea or edema.   No palpitations, lightheadedness, dizziness, weakness or syncope/near syncope.  No TIA/amaurosis fugax symptoms.  No melena, hematochezia, hematuria, or  epstaxis.  No claudication.  ROS: A comprehensive was performed. Review of Systems  Constitutional: Negative for malaise/fatigue.  HENT: Negative for nosebleeds.   Respiratory: Negative for cough and wheezing.   Cardiovascular: Positive for leg swelling (chronic R Leg/ankle swelling).  Gastrointestinal: Negative for blood in stool and melena.  Genitourinary: Negative for hematuria.  Musculoskeletal: Positive for joint pain (R foot/ankle).  Neurological: Positive for dizziness (occasional orthostatic dizziness, also notes some "vertigo-type" Sx.). Negative for focal weakness and weakness.  Psychiatric/Behavioral: Negative.   All other systems reviewed and are negative.   I have reviewed and (if needed) personally updated the patient's problem list, medications, allergies, past medical and surgical history, social and family history.   Past Medical History:  Diagnosis Date  . Allergy   . Cancer ([place])    skin cancer to upper chest/basal cell 2007  . Chronic headaches   . Chronic neck pain    DJD  . Chronic pain in right foot   . DDD (degenerative disc disease), lumbar   . Depression   . Dyslipidemia   . Erectile dysfunction   . Exogenous obesity   . GERD (gastroesophageal reflux disease)   . H/O acquired cardiomyopathy 1996   RESOLVED (apparently was thought to have had an MI, but did not have any intervention).  He developed cardiomyopathy that forced him to retire from [employer]..  The current EF 60-65%.  . History of fracture due to fall 1988   Bilateral wrist fractures  .  Hyperlipidemia   . Hypertension   . Hypogonadism male   . IBS (irritable bowel syndrome)   . Obstructive sleep apnea   . Sleep apnea    cpap    Past Surgical History:  Procedure Laterality Date  . CARDIAC CATHETERIZATION  2000   Nonobstructive disease.  Persistently reduced EF  . CARPAL TUNNEL RELEASE Left   . COLONOSCOPY    . CORONARY CT ANGIOGRAM  07/2017   Coronary calcium score 0?.   Focal noncalcific plaque (30%) in proximal LAD.  Otherwise no significant CAD.  Normal aortic root (3.2 cm)  . HAMMER TOE SURGERY Right   . ingrown toenail surgery Bilateral    2017  . IR RADIOLOGY PERIPHERAL GUIDED IV START  07/25/2017  . IR US GUIDE VASC ACCESS RIGHT  07/25/2017  . MENISCUS REPAIR    . ORIF WRIST FRACTURE Bilateral   . PLANTAR FASCIA RELEASE Right   . SHOULDER ARTHROSCOPY Left   . TENDON REPAIR Right    right quad tendon repair  . Tilt Table Test     Noted to have vasovagal syncope  . TRANSTHORACIC ECHOCARDIOGRAM  06/21/2015   Normal LV function.  EF 57%.  Mild left atrial enlargement, trace AI, trace MR, mild PI.  Marland Kitchen TRANSTHORACIC ECHOCARDIOGRAM  06/2017   EF 60-65%.  Mild diastolic dysfunction.  No significant valvular abnormality.  Mild aortic calcification.    Current Meds  Medication Sig  . B Complex Vitamins (VITAMIN B COMPLEX PO) Take 2,500 mcg by mouth daily.   . Calcium Carbonate (CALCIUM 600 PO) Take 1 tablet by mouth daily.  . carvedilol (COREG) 12.5 MG tablet Take 12.5 mg by mouth 2 (two) times daily.  . Cholecalciferol (VITAMIN D3) 5000 units TABS Take by mouth.  . losartan-hydrochlorothiazide (HYZAAR) 100-25 MG tablet Take 1 tablet by mouth daily.  . Magnesium 250 MG TABS Take 30 mg by mouth daily.  . modafinil (PROVIGIL) 200 MG tablet Take 1 tablet (200 mg total) by mouth daily.  . Multiple Vitamin (MULTIVITAMIN WITH MINERALS) TABS tablet Take 1 tablet by mouth daily.  . sildenafil (REVATIO) 20 MG tablet Take 20 mg by mouth 3 (three) times daily.  . simvastatin (ZOCOR) 20 MG tablet Take 1 tablet (20 mg total) by mouth daily.  . Testosterone (FORTESTA) 10 MG/ACT (2%) GEL Place 40 mg onto the skin daily.   Marland Kitchen topiramate (TOPAMAX) 25 MG tablet Pt takes 12.5 mg daily  . [DISCONTINUED] aspirin 325 MG tablet Take 325 mg by mouth daily.    Current Facility-Administered Medications for the 09/21/18 encounter (Office Visit) with Leonie Man, MD  Medication   . 0.9 %  sodium chloride infusion    No Known Allergies  Social History   Tobacco Use  . Smoking status: Former Smoker    Last attempt to quit: 1996    Years since quitting: 24.1  . Smokeless tobacco: Never Used  Substance Use Topics  . Alcohol use: No  . Drug use: No   Social History   Social History Narrative   He has been married for 36 years.     Son (born 15) -lives in [place]   Daughter (born 41) also lives in [place].   Retired from [employer].  Enjoys doing house remodeling.   Remote history of smoking, stopped in 1996.   Minimal alcohol use.    family history includes Cancer in his father and mother; Depression in his mother; Diabetes in his father; Heart disease in his  father, mother, and sister; High Cholesterol in his father and sister; Hypertension in his father and sister; Stomach cancer in his mother.  Wt Readings from Last 3 Encounters:  09/21/18 278 lb (126.1 kg)  07/07/18 270 lb (122.5 kg)  03/03/18 267 lb (121.1 kg)    PHYSICAL EXAM BP 140/82   Pulse 66   Ht 6' (1.829 m)   Wt 278 lb (126.1 kg)   BMI 37.70 kg/m  Physical Exam  Constitutional: He is oriented to person, place, and time. He appears well-developed and well-nourished. No distress.  Healthy-appearing.  Well-groomed  HENT:  Head: Normocephalic and atraumatic.  Mouth/Throat: Oropharynx is clear and moist.  Neck: Normal range of motion. Neck supple. No hepatojugular reflux and no JVD present. Carotid bruit is not present.  Cardiovascular: Normal rate, regular rhythm, S1 normal, S2 normal and normal pulses.  No extrasystoles are present. PMI is not displaced. Exam reveals no gallop and no friction rub.  Murmur (Soft 1/6 SEM at RUSB.) heard. Pulmonary/Chest: Effort normal and breath sounds normal. No respiratory distress. He has no wheezes. He has no rales.  Abdominal: Soft. Bowel sounds are normal. He exhibits no distension. There is no abdominal tenderness. There is no rebound.    Musculoskeletal: Normal range of motion.        General: No edema.  Neurological: He is alert and oriented to person, place, and time.  Psychiatric: He has a normal mood and affect. His behavior is normal. Judgment and thought content normal.  Vitals reviewed.   Adult ECG Report  Rate: 66 ;  Rhythm: normal sinus rhythm, premature ventricular contractions (PVC) and Borderline criteria for LVH.  Otherwise normal axis, intervals and durations.;   Narrative Interpretation: Stable EKG  Other studies Reviewed: Additional studies/ records that were reviewed today include:   Recent Labs:  [facility] 08/21/2018: TC 136, TG 94, HDL 58, LDL 59.  A1c 5.08.  Hgb 16.2 Cr 0.92.  K+ 4.1.   ASSESSMENT / PLAN: Problem List Items Addressed This Visit    Essential hypertension (Chronic)    BP was high with PCP visit - Coreg up to 12.5 mg BID --> borderline today.  Will ask him to monitor -- reluctant to further titrate beta blocker due to HR in 60s.      Relevant Medications   carvedilol (COREG) 12.5 MG tablet   aspirin EC 81 MG tablet   Exertional dyspnea (Chronic)    At this point, with the relatively normal coronary artery CT scan and normal EF by echo, I will do suspect that his dyspnea is related to deconditioning and weight.      History of cardiomyopathy - Primary (Chronic)    Mostly likely h/o viral CM --> re-look Echo was normal. Non-ischemic --> recent Coronary CTA was basically normal.      Relevant Orders   EKG 12-Lead (Completed)   Obstructive sleep apnea treated with bilevel positive airway pressure (BiPAP) (Chronic)    Back to CPAP after 8 month BiPAP (re-studied).        Vasovagal syncope (Chronic)    H/o + tilt table test -- no further Sx.  Avoids triggers.  Would not push BP control.      Relevant Medications   carvedilol (COREG) 12.5 MG tablet   aspirin EC 81 MG tablet   Other Relevant Orders   EKG 12-Lead (Completed)       I spent a total of 25 minutes with the patient  and chart review. >  50%  of the time was spent in direct patient consultation.   Current medicines are reviewed at length with the patient today.  (+/- concerns) none The following changes have been made:  none   Patient Instructions  Medication Instructions:   DECREASE ASPIRIN 81 MG DAILY  If you need a refill on your cardiac medications before your next appointment, please call your pharmacy.   Lab work: Not needed If you have labs (blood work) drawn today and your tests are completely normal, you will receive your results only by: Marland Kitchen MyChart Message (if you have MyChart) OR . A paper copy in the mail If you have any lab test that is abnormal or we need to change your treatment, we will call you to review the results.  Testing/Procedures: Not needed  Follow-Up: At [facility], you and your health needs are our priority.  As part of our continuing mission to provide you with exceptional heart care, we have created designated Provider Care Teams.  These Care Teams include your primary Cardiologist (physician) and Advanced Practice Providers (APPs -  Physician Assistants and Nurse Practitioners) who all work together to provide you with the care you need, when you need it. You will need a follow up appointment in 12 months.  Please call our office 2 months in advance to schedule this appointment.  You may see Glenetta Hew, MD or one of the following Advanced Practice Providers on your designated Care Team:   Rosaria Ferries, PA-C . Jory Sims, DNP, ANP  Any Other Special Instructions Will Be Listed Below (If Applicable).    Studies Ordered:   Orders Placed This Encounter  Procedures  . EKG 12-Lead      Glenetta Hew, M.D., M.S. Interventional Cardiologist   Pager # [phone] Phone # [phone] [address] [place], [place] [address]   Thank you for choosing Heartcare at [facility]!!

## 2018-09-21 NOTE — Assessment & Plan Note (Signed)
Mostly likely h/o viral CM --> re-look Echo was normal. Non-ischemic --> recent Coronary CTA was basically normal.

## 2018-09-21 NOTE — Assessment & Plan Note (Signed)
H/o + tilt table test -- no further Sx.  Avoids triggers.  Would not push BP control.

## 2018-09-21 NOTE — Patient Instructions (Signed)
Medication Instructions:   DECREASE ASPIRIN 81 MG DAILY  If you need a refill on your cardiac medications before your next appointment, please call your pharmacy.   Lab work: Not needed If you have labs (blood work) drawn today and your tests are completely normal, you will receive your results only by: Marland Kitchen MyChart Message (if you have MyChart) OR . A paper copy in the mail If you have any lab test that is abnormal or we need to change your treatment, we will call you to review the results.  Testing/Procedures: Not needed  Follow-Up: At [facility], you and your health needs are our priority.  As part of our continuing mission to provide you with exceptional heart care, we have created designated Provider Care Teams.  These Care Teams include your primary Cardiologist (physician) and Advanced Practice Providers (APPs -  Physician Assistants and Nurse Practitioners) who all work together to provide you with the care you need, when you need it. You will need a follow up appointment in 12 months.  Please call our office 2 months in advance to schedule this appointment.  You may see Glenetta Hew, MD or one of the following Advanced Practice Providers on your designated Care Team:   Rosaria Ferries, PA-C . Jory Sims, DNP, ANP  Any Other Special Instructions Will Be Listed Below (If Applicable).

## 2018-09-22 ENCOUNTER — Other Ambulatory Visit: Payer: Self-pay | Admitting: *Deleted

## 2018-09-22 ENCOUNTER — Encounter: Payer: Self-pay | Admitting: Cardiology

## 2018-09-22 DIAGNOSIS — G5601 Carpal tunnel syndrome, right upper limb: Secondary | ICD-10-CM

## 2018-09-22 NOTE — Assessment & Plan Note (Signed)
At this point, with the relatively normal coronary artery CT scan and normal EF by echo, I will do suspect that his dyspnea is related to deconditioning and weight.

## 2018-09-24 ENCOUNTER — Ambulatory Visit (INDEPENDENT_AMBULATORY_CARE_PROVIDER_SITE_OTHER): Payer: Medicare Other | Admitting: Neurology

## 2018-09-24 DIAGNOSIS — G5601 Carpal tunnel syndrome, right upper limb: Secondary | ICD-10-CM

## 2018-09-24 DIAGNOSIS — M5412 Radiculopathy, cervical region: Secondary | ICD-10-CM

## 2018-09-24 NOTE — Procedures (Signed)
[facility]  [address], [place]  [place], [place] [address] Tel: [phone] Fax:  [phone] Test Date:  09/24/2018  Patient: Alexander [name] DOB: May 11, 1951 Physician: Narda Amber, DO  Sex: Male Height: 6\' 0"  Ref Phys: Leanora Cover, MD  ID#: [id number] Temp: 34.0C Technician:    Patient Complaints: This is a 68 year old man with history of left carpal tunnel release referred for evaluation of right hand tingling.  NCV & EMG Findings: Extensive electrodiagnostic testing of the right upper extremity and additional studies of the left shows:  1. Right median sensory response shows prolonged distal peak latency (4.4 ms) and reduced amplitude (4.7 V).  Left median sensory response shows latency and reduced amplitude (5.5 V).  Bilateral ulnar sensory responses are within normal limits. 2. Bilateral median motor responses show prolonged distal onset latency (L4.2, R4.5 ms).  Bilateral ulnar motor responses are within normal limits.  3. Chronic motor axonal loss changes are seen affecting the right abductor pollicis brevis, biceps, and deltoid muscles.  There is no evidence of accompanied active denervation.  These findings are not present in the left upper extremity.  Impression: 1. Moderate right median neuropathy at or distal to the wrist, consistent with a clinical diagnosis of carpal tunnel syndrome.   2. Mild chronic C5-6 radiculopathy affecting the right upper extremity. 3. There is evidence of the residuals of the previously treated left carpal tunnel syndrome.   ___________________________ Narda Amber, DO    Nerve Conduction Studies Anti Sensory Summary Table   Site NR Peak (ms) Norm Peak (ms) P-T Amp (V) Norm P-T Amp  Left Median Anti Sensory (2nd Digit)  34C  Wrist    3.8 <3.8 5.5 >10  Right Median Anti Sensory (2nd Digit)  34C  Wrist    4.4 <3.8 4.7 >10  Left Ulnar Anti Sensory (5th Digit)  34C  Wrist    2.9 <3.2 7.9 >5  Right Ulnar Anti  Sensory (5th Digit)  34C  Wrist    3.2 <3.2 6.4 >5   Motor Summary Table   Site NR Onset (ms) Norm Onset (ms) O-P Amp (mV) Norm O-P Amp Site1 Site2 Delta-0 (ms) Dist (cm) Vel (m/s) Norm Vel (m/s)  Left Median Motor (Abd Poll Brev)  34C  Wrist    4.2 <4.0 7.1 >5 Elbow Wrist 6.3 32.0 51 >50  Elbow    10.5  6.6         Right Median Motor (Abd Poll Brev)  34C  Wrist    4.5 <4.0 6.9 >5 Elbow Wrist 6.0 31.0 52 >50  Elbow    10.5  6.4         Left Ulnar Motor (Abd Dig Minimi)  34C  Wrist    2.7 <3.1 8.7 >7 B Elbow Wrist 4.8 30.0 63 >50  B Elbow    7.5  6.4  A Elbow B Elbow 1.7 10.0 59 >50  A Elbow    9.2  6.1         Right Ulnar Motor (Abd Dig Minimi)  34C  Wrist    2.4 <3.1 8.4 >7 B Elbow Wrist 5.7 31.0 54 >50  B Elbow    8.1  7.5  A Elbow B Elbow 1.7 10.0 59 >50  A Elbow    9.8  7.2          EMG   Side Muscle Ins Act Fibs Psw Fasc Number Recrt Dur Dur. Amp Amp. Poly Poly. Comment  Right 1stDorInt Nml  Nml Nml Nml Nml Nml Nml Nml Nml Nml Nml Nml N/A  Right Abd Poll Brev Nml Nml Nml Nml 1- Rapid Few 1+ Few 1+ Nml Nml N/A  Right PronatorTeres Nml Nml Nml Nml Nml Nml Nml Nml Nml Nml Nml Nml N/A  Right Biceps Nml Nml Nml Nml 1- Rapid Some 1+ Some 1+ Nml Nml N/A  Right Triceps Nml Nml Nml Nml Nml Nml Nml Nml Nml Nml Nml Nml N/A  Right Deltoid Nml Nml Nml Nml 1- Rapid Some 1+ Some 1+ Nml Nml N/A  Left 1stDorInt Nml Nml Nml Nml Nml Nml Nml Nml Nml Nml Nml Nml N/A  Left Abd Poll Brev Nml Nml Nml Nml Nml Nml Nml Nml Nml Nml Nml Nml N/A  Left PronatorTeres Nml Nml Nml Nml Nml Nml Nml Nml Nml Nml Nml Nml N/A  Left Biceps Nml Nml Nml Nml Nml Nml Nml Nml Nml Nml Nml Nml N/A  Left Deltoid Nml Nml Nml Nml Nml Nml Nml Nml Nml Nml Nml Nml N/A      Waveforms:

## 2018-09-30 DIAGNOSIS — M47812 Spondylosis without myelopathy or radiculopathy, cervical region: Secondary | ICD-10-CM | POA: Diagnosis not present

## 2018-09-30 DIAGNOSIS — G5603 Carpal tunnel syndrome, bilateral upper limbs: Secondary | ICD-10-CM | POA: Diagnosis not present

## 2018-10-05 ENCOUNTER — Other Ambulatory Visit: Payer: Self-pay | Admitting: Cardiology

## 2018-10-05 ENCOUNTER — Other Ambulatory Visit: Payer: Self-pay | Admitting: Orthopedic Surgery

## 2018-10-05 DIAGNOSIS — G5601 Carpal tunnel syndrome, right upper limb: Secondary | ICD-10-CM | POA: Diagnosis not present

## 2018-10-06 ENCOUNTER — Telehealth: Payer: Self-pay | Admitting: *Deleted

## 2018-10-06 NOTE — Telephone Encounter (Signed)
   [facility] Pre-operative Risk Assessment    Request for surgical clearance:  1. What type of surgery is being performed? Right carpal tunnel release   2. When is this surgery scheduled? 11-17-2018   3. What type of clearance is required (medical clearance vs. Pharmacy clearance to hold med vs. Both)? medical  4. Are there any medications that need to be held prior to surgery and how long?none   5. Practice name and name of physician performing surgery? Dr Leanora Cover   6. What is your office phone number [phone]    7.   What is your office fax number [phone]  8.   Anesthesia type (None, local, MAC, general) ? IV regional forearm block   Fredia Beets 10/06/2018, 7:09 AM  _________________________________________________________________   (provider comments below)

## 2018-10-06 NOTE — Telephone Encounter (Signed)
Okay for surgery.  Low risk surgery.  Low risk patient.  Glenetta Hew, MD

## 2018-10-06 NOTE — Telephone Encounter (Signed)
   Primary Cardiologist: Glenetta Hew, MD  Chart reviewed as part of pre-operative protocol coverage.  Dr. Ellyn Hack, can you cleared the patient? He was doing well when you saw him 2 weeks ago except some DOE which felt due to deconditioning and weight.   Briar Chapel, [place] 10/06/2018, 8:45 AM

## 2018-10-07 ENCOUNTER — Other Ambulatory Visit: Payer: Self-pay | Admitting: Cardiology

## 2018-11-17 ENCOUNTER — Ambulatory Visit (HOSPITAL_BASED_OUTPATIENT_CLINIC_OR_DEPARTMENT_OTHER): Admit: 2018-11-17 | Payer: Medicare Other | Admitting: Orthopedic Surgery

## 2018-11-17 ENCOUNTER — Encounter (HOSPITAL_BASED_OUTPATIENT_CLINIC_OR_DEPARTMENT_OTHER): Payer: Self-pay

## 2018-11-17 SURGERY — CARPAL TUNNEL RELEASE
Anesthesia: Choice | Laterality: Right

## 2018-12-24 ENCOUNTER — Other Ambulatory Visit: Payer: Self-pay | Admitting: Orthopedic Surgery

## 2018-12-25 ENCOUNTER — Other Ambulatory Visit: Payer: Self-pay | Admitting: Orthopedic Surgery

## 2018-12-30 ENCOUNTER — Other Ambulatory Visit: Payer: Self-pay

## 2018-12-30 ENCOUNTER — Encounter (HOSPITAL_BASED_OUTPATIENT_CLINIC_OR_DEPARTMENT_OTHER): Payer: Self-pay | Admitting: *Deleted

## 2019-01-01 ENCOUNTER — Other Ambulatory Visit (HOSPITAL_COMMUNITY)
Admission: RE | Admit: 2019-01-01 | Discharge: 2019-01-01 | Disposition: A | Discharge status: Home / Self Care | Payer: Medicare Other | Source: Ambulatory Visit | Attending: Orthopedic Surgery | Admitting: Orthopedic Surgery

## 2019-01-01 ENCOUNTER — Encounter (HOSPITAL_BASED_OUTPATIENT_CLINIC_OR_DEPARTMENT_OTHER)
Admission: RE | Admit: 2019-01-01 | Discharge: 2019-01-01 | Disposition: A | Discharge status: Home / Self Care | Payer: Medicare Other | Source: Ambulatory Visit | Attending: Orthopedic Surgery | Admitting: Orthopedic Surgery

## 2019-01-01 ENCOUNTER — Other Ambulatory Visit: Payer: Self-pay

## 2019-01-01 DIAGNOSIS — Z87891 Personal history of nicotine dependence: Secondary | ICD-10-CM | POA: Diagnosis not present

## 2019-01-01 DIAGNOSIS — Z6836 Body mass index (BMI) 36.0-36.9, adult: Secondary | ICD-10-CM | POA: Diagnosis not present

## 2019-01-01 DIAGNOSIS — G4733 Obstructive sleep apnea (adult) (pediatric): Secondary | ICD-10-CM | POA: Diagnosis not present

## 2019-01-01 DIAGNOSIS — Z01812 Encounter for preprocedural laboratory examination: Secondary | ICD-10-CM | POA: Insufficient documentation

## 2019-01-01 DIAGNOSIS — E785 Hyperlipidemia, unspecified: Secondary | ICD-10-CM | POA: Diagnosis not present

## 2019-01-01 DIAGNOSIS — G5601 Carpal tunnel syndrome, right upper limb: Secondary | ICD-10-CM | POA: Diagnosis not present

## 2019-01-01 DIAGNOSIS — E291 Testicular hypofunction: Secondary | ICD-10-CM | POA: Diagnosis not present

## 2019-01-01 DIAGNOSIS — I10 Essential (primary) hypertension: Secondary | ICD-10-CM | POA: Diagnosis not present

## 2019-01-01 DIAGNOSIS — Z79899 Other long term (current) drug therapy: Secondary | ICD-10-CM | POA: Diagnosis not present

## 2019-01-01 DIAGNOSIS — Z1159 Encounter for screening for other viral diseases: Secondary | ICD-10-CM | POA: Insufficient documentation

## 2019-01-01 DIAGNOSIS — E6609 Other obesity due to excess calories: Secondary | ICD-10-CM | POA: Diagnosis not present

## 2019-01-01 DIAGNOSIS — Z79890 Hormone replacement therapy (postmenopausal): Secondary | ICD-10-CM | POA: Diagnosis not present

## 2019-01-01 DIAGNOSIS — N529 Male erectile dysfunction, unspecified: Secondary | ICD-10-CM | POA: Diagnosis not present

## 2019-01-01 DIAGNOSIS — Z85828 Personal history of other malignant neoplasm of skin: Secondary | ICD-10-CM | POA: Diagnosis not present

## 2019-01-01 DIAGNOSIS — K219 Gastro-esophageal reflux disease without esophagitis: Secondary | ICD-10-CM | POA: Diagnosis not present

## 2019-01-01 DIAGNOSIS — Z7982 Long term (current) use of aspirin: Secondary | ICD-10-CM | POA: Diagnosis not present

## 2019-01-01 LAB — BASIC METABOLIC PANEL
Anion gap: 8 (ref 5–15)
BUN: 17 mg/dL (ref 8–23)
CO2: 25 mmol/L (ref 22–32)
Calcium: 9.1 mg/dL (ref 8.9–10.3)
Chloride: 107 mmol/L (ref 98–111)
Creatinine, Ser: 0.89 mg/dL (ref 0.61–1.24)
GFR calc Af Amer: >60 mL/min (ref >60)
GFR calc non Af Amer: >60 mL/min (ref >60)
Glucose, Bld: 105 mg/dL — ABNORMAL HIGH (ref 70–99)
Potassium: 4.2 mmol/L (ref 3.5–5.1)
Sodium: 140 mmol/L (ref 135–145)

## 2019-01-01 NOTE — Progress Notes (Signed)
Ensure pre surgery drink given with instructions to complete by 0800 dos, pt verbalized understanding.

## 2019-01-02 LAB — NOVEL CORONAVIRUS, NAA (HOSP ORDER, SEND-OUT TO REF LAB; TAT 18-24 HRS): SARS-CoV-2, NAA: NOT DETECTED

## 2019-01-04 ENCOUNTER — Other Ambulatory Visit: Payer: Self-pay | Admitting: Neurology

## 2019-01-04 MED ORDER — MODAFINIL 200 MG PO TABS: 200.0000 mg | ORAL_TABLET | Freq: Every day | ORAL | 1 refills | Status: DC

## 2019-01-05 ENCOUNTER — Encounter (HOSPITAL_BASED_OUTPATIENT_CLINIC_OR_DEPARTMENT_OTHER)
Admission: RE | Disposition: A | Discharge status: Home / Self Care | Payer: Self-pay | Source: Home / Self Care | Attending: Orthopedic Surgery

## 2019-01-05 ENCOUNTER — Ambulatory Visit (HOSPITAL_BASED_OUTPATIENT_CLINIC_OR_DEPARTMENT_OTHER)
Admission: RE | Admit: 2019-01-05 | Discharge: 2019-01-05 | Disposition: A | Discharge status: Home / Self Care | Payer: Medicare Other | Attending: Orthopedic Surgery | Admitting: Orthopedic Surgery

## 2019-01-05 ENCOUNTER — Ambulatory Visit (HOSPITAL_BASED_OUTPATIENT_CLINIC_OR_DEPARTMENT_OTHER): Payer: Medicare Other | Admitting: Anesthesiology

## 2019-01-05 ENCOUNTER — Other Ambulatory Visit: Payer: Self-pay

## 2019-01-05 ENCOUNTER — Encounter (HOSPITAL_BASED_OUTPATIENT_CLINIC_OR_DEPARTMENT_OTHER): Payer: Self-pay

## 2019-01-05 DIAGNOSIS — Z7982 Long term (current) use of aspirin: Secondary | ICD-10-CM | POA: Diagnosis not present

## 2019-01-05 DIAGNOSIS — Z79890 Hormone replacement therapy (postmenopausal): Secondary | ICD-10-CM | POA: Diagnosis not present

## 2019-01-05 DIAGNOSIS — E6609 Other obesity due to excess calories: Secondary | ICD-10-CM | POA: Diagnosis not present

## 2019-01-05 DIAGNOSIS — G5601 Carpal tunnel syndrome, right upper limb: Secondary | ICD-10-CM | POA: Diagnosis not present

## 2019-01-05 DIAGNOSIS — I10 Essential (primary) hypertension: Secondary | ICD-10-CM | POA: Insufficient documentation

## 2019-01-05 DIAGNOSIS — E291 Testicular hypofunction: Secondary | ICD-10-CM | POA: Diagnosis not present

## 2019-01-05 DIAGNOSIS — Z79899 Other long term (current) drug therapy: Secondary | ICD-10-CM | POA: Diagnosis not present

## 2019-01-05 DIAGNOSIS — G4733 Obstructive sleep apnea (adult) (pediatric): Secondary | ICD-10-CM | POA: Insufficient documentation

## 2019-01-05 DIAGNOSIS — Z87891 Personal history of nicotine dependence: Secondary | ICD-10-CM | POA: Diagnosis not present

## 2019-01-05 DIAGNOSIS — N529 Male erectile dysfunction, unspecified: Secondary | ICD-10-CM | POA: Insufficient documentation

## 2019-01-05 DIAGNOSIS — E785 Hyperlipidemia, unspecified: Secondary | ICD-10-CM | POA: Diagnosis not present

## 2019-01-05 DIAGNOSIS — Z6836 Body mass index (BMI) 36.0-36.9, adult: Secondary | ICD-10-CM | POA: Insufficient documentation

## 2019-01-05 DIAGNOSIS — G5611 Other lesions of median nerve, right upper limb: Secondary | ICD-10-CM | POA: Diagnosis not present

## 2019-01-05 DIAGNOSIS — K219 Gastro-esophageal reflux disease without esophagitis: Secondary | ICD-10-CM | POA: Diagnosis not present

## 2019-01-05 DIAGNOSIS — Z85828 Personal history of other malignant neoplasm of skin: Secondary | ICD-10-CM | POA: Insufficient documentation

## 2019-01-05 DIAGNOSIS — G47419 Narcolepsy without cataplexy: Secondary | ICD-10-CM | POA: Diagnosis not present

## 2019-01-05 HISTORY — PX: CARPAL TUNNEL RELEASE: SHX101

## 2019-01-05 SURGERY — CARPAL TUNNEL RELEASE
Anesthesia: Regional | Site: Hand | Laterality: Right

## 2019-01-05 MED ORDER — PROPOFOL 500 MG/50ML IV EMUL
INTRAVENOUS | Status: DC | PRN
  Administered 2019-01-05: 100 ug/kg/min via INTRAVENOUS

## 2019-01-05 MED ORDER — OXYCODONE HCL 5 MG PO TABS: 5.0000 mg | ORAL_TABLET | Freq: Once | ORAL | Status: DC | PRN

## 2019-01-05 MED ORDER — MEPERIDINE HCL 25 MG/ML IJ SOLN: 6.2500 mg | INTRAMUSCULAR | Status: DC | PRN

## 2019-01-05 MED ORDER — OXYCODONE HCL 5 MG/5ML PO SOLN: 5.0000 mg | Freq: Once | ORAL | Status: DC | PRN

## 2019-01-05 MED ORDER — FENTANYL CITRATE (PF) 100 MCG/2ML IJ SOLN
INTRAMUSCULAR | Status: AC
  Filled 2019-01-05: qty 2

## 2019-01-05 MED ORDER — MIDAZOLAM HCL 2 MG/2ML IJ SOLN
INTRAMUSCULAR | Status: AC
  Filled 2019-01-05: qty 2

## 2019-01-05 MED ORDER — HYDROCODONE-ACETAMINOPHEN 5-325 MG PO TABS: ORAL_TABLET | ORAL | 0 refills | Status: DC

## 2019-01-05 MED ORDER — ONDANSETRON HCL 4 MG/2ML IJ SOLN
4.0000 mg | Freq: Once | INTRAMUSCULAR | Status: AC | PRN
  Administered 2019-01-05: 13:00:00 4 mg via INTRAVENOUS

## 2019-01-05 MED ORDER — SCOPOLAMINE 1 MG/3DAYS TD PT72: 1.0000 | MEDICATED_PATCH | Freq: Once | TRANSDERMAL | Status: DC | PRN

## 2019-01-05 MED ORDER — CHLORHEXIDINE GLUCONATE 4 % EX LIQD: 60.0000 mL | Freq: Once | CUTANEOUS | Status: DC

## 2019-01-05 MED ORDER — BUPIVACAINE HCL (PF) 0.25 % IJ SOLN
INTRAMUSCULAR | Status: DC | PRN
  Administered 2019-01-05: 10 mL

## 2019-01-05 MED ORDER — CEFAZOLIN SODIUM-DEXTROSE 2-4 GM/100ML-% IV SOLN
2.0000 g | INTRAVENOUS | Status: AC
  Administered 2019-01-05: 3 g via INTRAVENOUS

## 2019-01-05 MED ORDER — FENTANYL CITRATE (PF) 100 MCG/2ML IJ SOLN
50.0000 ug | INTRAMUSCULAR | Status: DC | PRN
  Administered 2019-01-05: 12:00:00 50 ug via INTRAVENOUS

## 2019-01-05 MED ORDER — LIDOCAINE HCL (PF) 0.5 % IJ SOLN
INTRAMUSCULAR | Status: DC | PRN
  Administered 2019-01-05: 40 mL via INTRAVENOUS

## 2019-01-05 MED ORDER — ACETAMINOPHEN 325 MG PO TABS: 325.0000 mg | ORAL_TABLET | ORAL | Status: DC | PRN

## 2019-01-05 MED ORDER — CEFAZOLIN SODIUM-DEXTROSE 1-4 GM/50ML-% IV SOLN
INTRAVENOUS | Status: AC
  Filled 2019-01-05: qty 50

## 2019-01-05 MED ORDER — ACETAMINOPHEN 160 MG/5ML PO SOLN: 325.0000 mg | ORAL | Status: DC | PRN

## 2019-01-05 MED ORDER — FENTANYL CITRATE (PF) 100 MCG/2ML IJ SOLN: 25.0000 ug | INTRAMUSCULAR | Status: DC | PRN

## 2019-01-05 MED ORDER — CEFAZOLIN SODIUM-DEXTROSE 2-4 GM/100ML-% IV SOLN
INTRAVENOUS | Status: AC
  Filled 2019-01-05: qty 100

## 2019-01-05 MED ORDER — MIDAZOLAM HCL 2 MG/2ML IJ SOLN
1.0000 mg | INTRAMUSCULAR | Status: DC | PRN
  Administered 2019-01-05: 12:00:00 1 mg via INTRAVENOUS

## 2019-01-05 MED ORDER — LIDOCAINE HCL (CARDIAC) PF 100 MG/5ML IV SOSY
PREFILLED_SYRINGE | INTRAVENOUS | Status: DC | PRN
  Administered 2019-01-05: 50 mg via INTRAVENOUS

## 2019-01-05 MED ORDER — LACTATED RINGERS IV SOLN: INTRAVENOUS | Status: DC

## 2019-01-05 SURGICAL SUPPLY — 37 items
BANDAGE ACE 3X5.8 VEL STRL LF (GAUZE/BANDAGES/DRESSINGS) ×2 IMPLANT
BLADE SURG 15 STRL LF DISP TIS (BLADE) ×2 IMPLANT
BLADE SURG 15 STRL SS (BLADE) ×2
BNDG ESMARK 4X9 LF (GAUZE/BANDAGES/DRESSINGS) IMPLANT
BNDG GAUZE ELAST 4 BULKY (GAUZE/BANDAGES/DRESSINGS) ×2 IMPLANT
CHLORAPREP W/TINT 26 (MISCELLANEOUS) ×2 IMPLANT
CORD BIPOLAR FORCEPS 12FT (ELECTRODE) ×2 IMPLANT
COVER BACK TABLE REUSABLE LG (DRAPES) ×2 IMPLANT
COVER MAYO STAND REUSABLE (DRAPES) ×2 IMPLANT
COVER WAND RF STERILE (DRAPES) IMPLANT
CUFF TOURN SGL QUICK 18X4 (TOURNIQUET CUFF) ×2 IMPLANT
DRAPE EXTREMITY T 121X128X90 (DISPOSABLE) ×2 IMPLANT
DRAPE SURG 17X23 STRL (DRAPES) ×2 IMPLANT
DRSG PAD ABDOMINAL 8X10 ST (GAUZE/BANDAGES/DRESSINGS) ×2 IMPLANT
GAUZE SPONGE 4X4 12PLY STRL (GAUZE/BANDAGES/DRESSINGS) ×2 IMPLANT
GAUZE XEROFORM 1X8 LF (GAUZE/BANDAGES/DRESSINGS) ×2 IMPLANT
GLOVE BIO SURGEON STRL SZ 6.5 (GLOVE) ×1 IMPLANT
GLOVE BIO SURGEON STRL SZ7.5 (GLOVE) ×2 IMPLANT
GLOVE BIOGEL PI IND STRL 7.0 (GLOVE) IMPLANT
GLOVE BIOGEL PI IND STRL 8 (GLOVE) ×1 IMPLANT
GLOVE BIOGEL PI INDICATOR 7.0 (GLOVE) ×2
GLOVE BIOGEL PI INDICATOR 8 (GLOVE) ×1
GOWN STRL REUS W/ TWL LRG LVL3 (GOWN DISPOSABLE) ×1 IMPLANT
GOWN STRL REUS W/TWL LRG LVL3 (GOWN DISPOSABLE) ×1
GOWN STRL REUS W/TWL XL LVL3 (GOWN DISPOSABLE) ×2 IMPLANT
NDL HYPO 25X1 1.5 SAFETY (NEEDLE) ×1 IMPLANT
NEEDLE HYPO 25X1 1.5 SAFETY (NEEDLE) ×2 IMPLANT
NS IRRIG 1000ML POUR BTL (IV SOLUTION) ×2 IMPLANT
PACK BASIN DAY SURGERY FS (CUSTOM PROCEDURE TRAY) ×2 IMPLANT
PADDING CAST ABS 4INX4YD NS (CAST SUPPLIES) ×1
PADDING CAST ABS COTTON 4X4 ST (CAST SUPPLIES) ×1 IMPLANT
STOCKINETTE 4X48 STRL (DRAPES) ×2 IMPLANT
SUT ETHILON 4 0 PS 2 18 (SUTURE) ×2 IMPLANT
SYR BULB 3OZ (MISCELLANEOUS) ×2 IMPLANT
SYR CONTROL 10ML LL (SYRINGE) ×2 IMPLANT
TOWEL GREEN STERILE FF (TOWEL DISPOSABLE) ×3 IMPLANT
UNDERPAD 30X30 (UNDERPADS AND DIAPERS) ×2 IMPLANT

## 2019-01-05 NOTE — Discharge Instructions (Addendum)

## 2019-01-05 NOTE — H&P (Signed)
Alexander [name] is an 68 y.o. male.   Chief Complaint: right carpal tunnel syndrome HPI: 68 yo male with numbness and tingling right hand.  Nocturnal symptoms.  Positive nerve conduction studies.  He wishes to have right carpal tunnel release.  Allergies: No Known Allergies  Past Medical History:  Diagnosis Date  . Allergy   . Cancer ([place])    skin cancer to upper chest/basal cell 2007  . Chronic headaches   . Chronic neck pain    DJD  . Chronic pain in right foot   . DDD (degenerative disc disease), lumbar   . Depression   . Dyslipidemia   . Erectile dysfunction   . Exogenous obesity   . GERD (gastroesophageal reflux disease)    no meds  . H/O acquired cardiomyopathy 1996   RESOLVED (apparently was thought to have had an MI, but did not have any intervention).  He developed cardiomyopathy that forced him to retire from [employer]..  The current EF 60-65%.  . History of fracture due to fall 1988   Bilateral wrist fractures  . Hyperlipidemia   . Hypertension   . Hypogonadism male   . IBS (irritable bowel syndrome)   . Obstructive sleep apnea   . Sleep apnea    CPAP nightly    Past Surgical History:  Procedure Laterality Date  . CARDIAC CATHETERIZATION  2000   Nonobstructive disease.  Persistently reduced EF  . CARPAL TUNNEL RELEASE Left   . COLONOSCOPY    . CORONARY CT ANGIOGRAM  07/2017   Coronary calcium score 0?.  Focal noncalcific plaque (30%) in proximal LAD.  Otherwise no significant CAD.  Normal aortic root (3.2 cm)  . HAMMER TOE SURGERY Right   . ingrown toenail surgery Bilateral    2017  . IR RADIOLOGY PERIPHERAL GUIDED IV START  07/25/2017  . IR US GUIDE VASC ACCESS RIGHT  07/25/2017  . MENISCUS REPAIR    . ORIF WRIST FRACTURE Bilateral   . PLANTAR FASCIA RELEASE Right   . SHOULDER ARTHROSCOPY Left   . TENDON REPAIR Right    right quad tendon repair  . Tilt Table Test     Noted to have vasovagal syncope  . TRANSTHORACIC ECHOCARDIOGRAM  06/21/2015    Normal LV function.  EF 57%.  Mild left atrial enlargement, trace AI, trace MR, mild PI.  Marland Kitchen TRANSTHORACIC ECHOCARDIOGRAM  06/2017   EF 60-65%.  Mild diastolic dysfunction.  No significant valvular abnormality.  Mild aortic calcification.    Family History: Family History  Problem Relation Age of Onset  . Cancer Mother        duodenal CA  . Heart disease Mother   . Depression Mother   . Stomach cancer Mother   . Cancer Father   . Hypertension Father   . High Cholesterol Father   . Heart disease Father   . Diabetes Father   . Hypertension Sister   . High Cholesterol Sister   . Heart disease Sister   . Esophageal cancer Neg Hx   . Rectal cancer Neg Hx   . Colon cancer Neg Hx     Social History:   reports that he quit smoking about 24 years ago. He has never used smokeless tobacco. He reports current alcohol use. He reports that he does not use drugs.  Medications: Medications Prior to Admission  Medication Sig Dispense Refill  . aspirin EC 81 MG tablet Take 1 tablet (81 mg total) by mouth daily. 90 tablet  3  . B Complex Vitamins (VITAMIN B COMPLEX PO) Take 2,500 mcg by mouth daily.     . Calcium Carbonate (CALCIUM 600 PO) Take 1 tablet by mouth daily.    . carvedilol (COREG) 12.5 MG tablet Take 12.5 mg by mouth 2 (two) times daily.    . Cholecalciferol (VITAMIN D3) 5000 units TABS Take by mouth.    . losartan-hydrochlorothiazide (HYZAAR) 100-25 MG tablet TAKE 1 TABLET DAILY 90 tablet 3  . Magnesium 250 MG TABS Take 30 mg by mouth daily.    . modafinil (PROVIGIL) 200 MG tablet Take 1 tablet (200 mg total) by mouth daily. 90 tablet 1  . Multiple Vitamin (MULTIVITAMIN WITH MINERALS) TABS tablet Take 1 tablet by mouth daily.    . simvastatin (ZOCOR) 20 MG tablet TAKE 1 TABLET DAILY 90 tablet 4  . Testosterone (FORTESTA) 10 MG/ACT (2%) GEL Place 40 mg onto the skin daily.     Marland Kitchen topiramate (TOPAMAX) 25 MG tablet 25 mg. Pt takes 12.5 mg daily  12  . sildenafil (REVATIO) 20 MG  tablet Take 20 mg by mouth 3 (three) times daily.      No results found for this or any previous visit (from the past 48 hour(s)).  No results found.   A comprehensive review of systems was negative.  Blood pressure (!) 149/76, pulse (!) 56, temperature 97.8 F (36.6 C), temperature source Oral, resp. rate 16, height 6' (1.829 m), weight 121.9 kg, SpO2 99 %.  General appearance: alert, cooperative and appears stated age Head: Normocephalic, without obvious abnormality, atraumatic Neck: supple, symmetrical, trachea midline Cardio: regular rate and rhythm Resp: clear to auscultation bilaterally Extremities: Intact sensation and capillary refill all digits.  +epl/fpl/io.  No wounds.  Pulses: 2+ and symmetric Skin: Skin color, texture, turgor normal. No rashes or lesions Neurologic: Grossly normal Incision/Wound: none  Assessment/Plan Right carpal tunnel syndrome.  Non operative and operative treatment options have been discussed with the patient and patient wishes to proceed with operative treatment. Risks, benefits, and alternatives of surgery have been discussed and the patient agrees with the plan of care.   Leanora Cover 01/05/2019, 11:57 AM

## 2019-01-05 NOTE — Transfer of Care (Signed)
Immediate Anesthesia Transfer of Care Note  Patient: Alexander [name]  Procedure(s) Performed: RIGHT CARPAL TUNNEL RELEASE (Right Hand)  Patient Location: PACU  Anesthesia Type:Bier block  Level of Consciousness: awake, alert , oriented and patient cooperative  Airway & Oxygen Therapy: Patient Spontanous Breathing and Patient connected to nasal cannula oxygen  Post-op Assessment: Report given to RN and Post -op Vital signs reviewed and stable  Post vital signs: Reviewed and stable  Last Vitals:  Vitals Value Taken Time  BP    Temp    Pulse 51 01/05/2019 12:51 PM  Resp 12 01/05/2019 12:51 PM  SpO2 94 % 01/05/2019 12:51 PM  Vitals shown include unvalidated device data.  Last Pain:  Vitals:   01/05/19 1025  TempSrc: Oral  PainSc: 0-No pain         Complications: No apparent anesthesia complications

## 2019-01-05 NOTE — Anesthesia Preprocedure Evaluation (Addendum)
Anesthesia Evaluation  Patient identified by MRN, date of birth, ID band Patient awake    Reviewed: Allergy & Precautions, H&P , NPO status , Patient's Chart, lab work & pertinent test results, reviewed documented beta blocker date and time   Airway Mallampati: II  TM Distance: >3 FB Neck ROM: full    Dental no notable dental hx. (+) Teeth Intact, Partial Lower, Dental Advisory Given, Missing   Pulmonary sleep apnea and Continuous Positive Airway Pressure Ventilation , former smoker,    Pulmonary exam normal breath sounds clear to auscultation       Cardiovascular Exercise Tolerance: Good hypertension, negative cardio ROS Normal cardiovascular exam Rhythm:regular Rate:Normal     Neuro/Psych  Headaches, PSYCHIATRIC DISORDERS Depression    GI/Hepatic Neg liver ROS, GERD  Medicated,  Endo/Other  negative endocrine ROS  Renal/GU negative Renal ROS  negative genitourinary   Musculoskeletal  (+) Arthritis , Osteoarthritis,    Abdominal   Peds  Hematology negative hematology ROS (+)   Anesthesia Other Findings   Reproductive/Obstetrics negative OB ROS                            Anesthesia Physical Anesthesia Plan  ASA: III  Anesthesia Plan: Bier Block and Bier Block-LIDOCAINE ONLY   Post-op Pain Management:    Induction: Intravenous  PONV Risk Score and Plan:   Airway Management Planned: Mask and Natural Airway  Additional Equipment:   Intra-op Plan:   Post-operative Plan:   Informed Consent: I have reviewed the patients History and Physical, chart, labs and discussed the procedure including the risks, benefits and alternatives for the proposed anesthesia with the patient or authorized representative who has indicated his/her understanding and acceptance.     Dental Advisory Given  Plan Discussed with: CRNA, Anesthesiologist and Surgeon  Anesthesia Plan Comments:          Anesthesia Quick Evaluation

## 2019-01-05 NOTE — Anesthesia Postprocedure Evaluation (Signed)
Anesthesia Post Note  Patient: Alexander [name]  Procedure(s) Performed: RIGHT CARPAL TUNNEL RELEASE (Right Hand)     Patient location during evaluation: PACU Anesthesia Type: Bier Block Level of consciousness: awake and alert Pain management: pain level controlled Vital Signs Assessment: post-procedure vital signs reviewed and stable Respiratory status: spontaneous breathing, nonlabored ventilation, respiratory function stable and patient connected to nasal cannula oxygen Cardiovascular status: stable and blood pressure returned to baseline Postop Assessment: no apparent nausea or vomiting Anesthetic complications: no    Last Vitals:  Vitals:   01/05/19 1315 01/05/19 1333  BP: (!) 160/76 (!) 156/77  Pulse: (!) 53 (!) 55  Resp: 16 16  Temp:  36.6 C  SpO2: 98% 100%    Last Pain:  Vitals:   01/05/19 1333  TempSrc: Oral  PainSc: 4                  ODDONO,ERNEST

## 2019-01-05 NOTE — Op Note (Signed)
01/05/2019 [facility]                              OPERATIVE REPORT   PREOPERATIVE DIAGNOSIS:  Right carpal tunnel syndrome.  POSTOPERATIVE DIAGNOSIS:  Right carpal tunnel syndrome.  PROCEDURE:  Right carpal tunnel release.  SURGEON:  Leanora Cover, MD  ASSISTANT:  none.  ANESTHESIA: Bier block with sedation  IV FLUIDS:  Per anesthesia flow sheet.  ESTIMATED BLOOD LOSS:  Minimal.  COMPLICATIONS:  None.  SPECIMENS:  None.  TOURNIQUET TIME:    Total Tourniquet Time Documented: Forearm (Right) - 32 minutes Total: Forearm (Right) - 32 minutes   DISPOSITION:  Stable to PACU.  LOCATION: [facility]  INDICATIONS:  68 yo male with numbness and tingling right hand.  Positive nerve conduction studies.   He wishes to have a carpal tunnel release for management of his symptoms.  Risks, benefits and alternatives of surgery were discussed including the risk of blood loss; infection; damage to nerves, vessels, tendons, ligaments, bone; failure of surgery; need for additional surgery; complications with wound healing; continued pain; recurrence of carpal tunnel syndrome; and damage to motor branch. He voiced understanding of these risks and elected to proceed.   OPERATIVE COURSE:  After being identified preoperatively by myself, the patient and I agreed upon the procedure and site of procedure.  The surgical site was marked.  The risks, benefits, and alternatives of the surgery were reviewed and he wished to proceed.  Surgical consent had been signed.  He was given IV Ancef as preoperative antibiotic prophylaxis.  He was transferred to the operating room and placed on the operating room table in supine position with the Right upper extremity on an armboard.  Bier block anesthesia was induced by the anesthesiologist.  Right upper extremity was prepped and draped in normal sterile orthopaedic fashion.  A surgical pause was performed between the surgeons, anesthesia, and  operating room staff, and all were in agreement as to the patient, procedure, and site of procedure.  Tourniquet at the proximal aspect of the forearm had been inflated for the Bier block  Incision was made over the transverse carpal ligament and carried into the subcutaneous tissues by spreading technique.  Bipolar electrocautery was used to obtain hemostasis.  The palmar fascia was sharply incised.  The transverse carpal ligament was identified and sharply incised.  It was incised distally first.  Care was taken to ensure complete decompression distally.  It was then incised proximally.  Scissors were used to split the distal aspect of the volar antebrachial fascia.  A finger was placed into the wound to ensure complete decompression, which was the case.  The nerve was examined.  It was flattened and hyperemic.  There was synovial inflammation.  The motor branch was identified and was intact.  The wound was copiously irrigated with sterile saline.  It was then closed with 4-0 nylon in a horizontal mattress fashion.  It was injected with 0.25% plain Marcaine to aid in postoperative analgesia.  It was dressed with sterile Xeroform, 4x4s, an ABD, and wrapped with Kerlix and an Ace bandage.  Tourniquet was deflated at 32 minutes.  Fingertips were pink with brisk capillary refill after deflation of the tourniquet.  Operative drapes were broken down.  The patient was awoken from anesthesia safely.  He was transferred back to stretcher and taken to the PACU in stable condition.  I will see him  back in the office in 1 week for postoperative followup.  I will give him a prescription for Norco 5/325 1-2 tabs PO q6 hours prn pain, dispense # 20.    Leanora Cover, MD Electronically signed, 01/05/19

## 2019-01-06 ENCOUNTER — Encounter (HOSPITAL_BASED_OUTPATIENT_CLINIC_OR_DEPARTMENT_OTHER): Payer: Self-pay | Admitting: Orthopedic Surgery

## 2019-02-23 DIAGNOSIS — G4733 Obstructive sleep apnea (adult) (pediatric): Secondary | ICD-10-CM | POA: Diagnosis not present

## 2019-02-23 DIAGNOSIS — D751 Secondary polycythemia: Secondary | ICD-10-CM | POA: Diagnosis not present

## 2019-02-23 DIAGNOSIS — I255 Ischemic cardiomyopathy: Secondary | ICD-10-CM | POA: Diagnosis not present

## 2019-02-23 DIAGNOSIS — I10 Essential (primary) hypertension: Secondary | ICD-10-CM | POA: Diagnosis not present

## 2019-04-30 DIAGNOSIS — Z23 Encounter for immunization: Secondary | ICD-10-CM | POA: Diagnosis not present

## 2019-05-17 DIAGNOSIS — D0462 Carcinoma in situ of skin of left upper limb, including shoulder: Secondary | ICD-10-CM | POA: Diagnosis not present

## 2019-05-17 DIAGNOSIS — Z85828 Personal history of other malignant neoplasm of skin: Secondary | ICD-10-CM | POA: Diagnosis not present

## 2019-05-17 DIAGNOSIS — L821 Other seborrheic keratosis: Secondary | ICD-10-CM | POA: Diagnosis not present

## 2019-05-17 DIAGNOSIS — L570 Actinic keratosis: Secondary | ICD-10-CM | POA: Diagnosis not present

## 2019-05-17 DIAGNOSIS — D1801 Hemangioma of skin and subcutaneous tissue: Secondary | ICD-10-CM | POA: Diagnosis not present

## 2019-05-17 DIAGNOSIS — L918 Other hypertrophic disorders of the skin: Secondary | ICD-10-CM | POA: Diagnosis not present

## 2019-05-17 DIAGNOSIS — L814 Other melanin hyperpigmentation: Secondary | ICD-10-CM | POA: Diagnosis not present

## 2019-05-24 DIAGNOSIS — D0462 Carcinoma in situ of skin of left upper limb, including shoulder: Secondary | ICD-10-CM | POA: Diagnosis not present

## 2019-07-06 ENCOUNTER — Other Ambulatory Visit: Payer: Self-pay | Admitting: Cardiology

## 2019-07-06 MED ORDER — LOSARTAN POTASSIUM-HCTZ 100-25 MG PO TABS: 1.0000 | ORAL_TABLET | Freq: Every day | ORAL | 1 refills | Status: DC

## 2019-07-06 NOTE — Telephone Encounter (Signed)
°*  STAT* If patient is at the pharmacy, call can be transferred to refill team.   1. Which medications need to be refilled? (please list name of each medication and dose if known)   losartan-hydrochlorothiazide (HYZAAR) 100-25 MG tablet     2. Which pharmacy/location (including street and city if local pharmacy) is medication to be sent to? [facility] [address], [address] AT [address]  3. Do they need a 30 day or 90 day supply? 90  [facility] but [facility].

## 2019-07-11 ENCOUNTER — Encounter: Payer: Self-pay | Admitting: Neurology

## 2019-07-13 ENCOUNTER — Ambulatory Visit (INDEPENDENT_AMBULATORY_CARE_PROVIDER_SITE_OTHER): Payer: Medicare Other | Admitting: Adult Health

## 2019-07-13 ENCOUNTER — Other Ambulatory Visit: Payer: Self-pay

## 2019-07-13 ENCOUNTER — Encounter: Payer: Self-pay | Admitting: Adult Health

## 2019-07-13 VITALS — BP 110/76 | HR 57 | Temp 97.8°F | Ht 72.0 in | Wt 270.0 lb

## 2019-07-13 DIAGNOSIS — Z9989 Dependence on other enabling machines and devices: Secondary | ICD-10-CM

## 2019-07-13 DIAGNOSIS — G4733 Obstructive sleep apnea (adult) (pediatric): Secondary | ICD-10-CM | POA: Diagnosis not present

## 2019-07-13 DIAGNOSIS — G47419 Narcolepsy without cataplexy: Secondary | ICD-10-CM

## 2019-07-13 MED ORDER — MODAFINIL 200 MG PO TABS: 200.0000 mg | ORAL_TABLET | Freq: Every day | ORAL | 1 refills | Status: DC

## 2019-07-13 NOTE — Progress Notes (Signed)
PATIENT: Brandt Loosen DOB: 09-07-1950  REASON FOR VISIT: follow up HISTORY FROM: patient  HISTORY OF PRESENT ILLNESS: Today 07/13/19:  Mr. Teaff is a 68 year old male with a history of narcolepsy and obstructive sleep apnea on CPAP.  His download indicates that he use the machine nightly for compliance of 100%.  He used the machine greater than 4 hours 29 days for compliance of 97%.  On average he uses his machine 6 hours and 38 minutes.  His residual AHI is 3 on 5 to 12 cm of water with EPR 2.  His leak in the 95th percentile is 10.9 L/min.  He states that the CPAP continues to work well for him.  He denies any new issues.  He reports that he continues to take modafinil with good benefit.  Denies any cataplectic events.  He does state that he has been having back pain.  He saw a pain physician here in [place] approximately 2 years ago.  He states that he never followed back up as he does not want to be on pain medicine the rest of his life.  He states that his back usually tightens up when he has been working in the yard all day.  This makes it difficult to walk initially but then improves at once he gets going.  He returns today for evaluation.  HISTORY I have the pleasure of meeting today with Mr. Mohmmed Dharia again a 68 year old Caucasian gentleman who had moved to [facility]  from [place] and presented  for follow-up care on his obstructive sleep apnea.  He had for 5.5  years used a BiPAP and now needed to qualify for a new machine.  He also was persistently and excessively daytime sleepy in spite of being a compliant use of BiPAP.   On 30 March 2018 he underwent a titration study in order to follow with an M SLT.  The titration study showed that he responded positively to simple CPAP, not BiPAP, this is an AHI of 0.3/h using 12 cmH2O pressure.  He was fitted with a Respironics DreamWear mask and medium size he is using the nasal cradle model.  The study was valid for an M SLT to follow  remarkable was that he had a short REM latency of only 27 minutes.  The M SLT that followed showed 4 naps with a mean sleep latency of only 2.6 minutes, documenting a significant hypersomnia.  2 of the naps also had early REM onset and in combination with his short REM latency during the nighttime study before this is diagnostic for narcolepsy.  The patient therefore received 2 prescriptions one for modafinil and 1 for a CPAP machine.  He has been doing very well with his CPAP machine and armodafinil his Epworth sleepiness score has decreased from 19-15 points.  He has been 100% compliant uses an AutoSet between 8 and 12 cmH2O pressure was 2 cm EPR his residual AHI is 1.6, there are no major air leaks in the 95th percentile pressure is 10.4 cmH2O valve is in the range of his current AutoSet.  There were no Cheyne-Stokes respiration noted and given the excellent numbers and clinical results this machine will now be permanently fix.  He is currently using a Investment banker, operational.  I also will refill the modafinil.  REVIEW OF SYSTEMS: Out of a complete 14 system review of symptoms, the patient complains only of the following symptoms, and all other reviewed systems are negative.  See HPI  ALLERGIES:  No Known Allergies  HOME MEDICATIONS: Outpatient Medications Prior to Visit  Medication Sig Dispense Refill   aspirin EC 81 MG tablet Take 1 tablet (81 mg total) by mouth daily. 90 tablet 3   B Complex Vitamins (VITAMIN B COMPLEX PO) Take 2,500 mcg by mouth daily.      Calcium Carbonate (CALCIUM 600 PO) Take 1 tablet by mouth daily.     carvedilol (COREG) 12.5 MG tablet Take 12.5 mg by mouth 2 (two) times daily.     Cholecalciferol (VITAMIN D3) 5000 units TABS Take by mouth.     losartan-hydrochlorothiazide (HYZAAR) 100-25 MG tablet Take 1 tablet by mouth daily. 90 tablet 1   Magnesium 250 MG TABS Take 30 mg by mouth daily.     modafinil (PROVIGIL) 200 MG tablet Take 1 tablet (200 mg total) by  mouth daily. 90 tablet 1   Multiple Vitamin (MULTIVITAMIN WITH MINERALS) TABS tablet Take 1 tablet by mouth daily.     sildenafil (REVATIO) 20 MG tablet Take 20 mg by mouth 3 (three) times daily.     simvastatin (ZOCOR) 20 MG tablet TAKE 1 TABLET DAILY 90 tablet 4   Testosterone (FORTESTA) 10 MG/ACT (2%) GEL Place 40 mg onto the skin daily.      topiramate (TOPAMAX) 25 MG tablet 25 mg. Pt takes 12.5 mg daily  12   HYDROcodone-acetaminophen (NORCO) 5-325 MG tablet 1-2 tabs po q6 hours prn pain 20 tablet 0   No facility-administered medications prior to visit.     PAST MEDICAL HISTORY: Past Medical History:  Diagnosis Date   Allergy    Cancer ([place])    skin cancer to upper chest/basal cell 2007   Chronic headaches    Chronic neck pain    DJD   Chronic pain in right foot    DDD (degenerative disc disease), lumbar    Depression    Dyslipidemia    Erectile dysfunction    Exogenous obesity    GERD (gastroesophageal reflux disease)    no meds   H/O acquired cardiomyopathy 1996   RESOLVED (apparently was thought to have had an MI, but did not have any intervention).  He developed cardiomyopathy that forced him to retire from [employer]..  The current EF 60-65%.   History of fracture due to fall 1988   Bilateral wrist fractures   Hyperlipidemia    Hypertension    Hypogonadism male    IBS (irritable bowel syndrome)    Obstructive sleep apnea    Sleep apnea    CPAP nightly    PAST SURGICAL HISTORY: Past Surgical History:  Procedure Laterality Date   CARDIAC CATHETERIZATION  2000   Nonobstructive disease.  Persistently reduced EF   CARPAL TUNNEL RELEASE Left    CARPAL TUNNEL RELEASE Right 01/05/2019   Procedure: RIGHT CARPAL TUNNEL RELEASE;  Surgeon: Leanora Cover, MD;  Location: [place];  Service: Orthopedics;  Laterality: Right;  Bier block   COLONOSCOPY     CORONARY CT ANGIOGRAM  07/2017   Coronary calcium score 0?.  Focal  noncalcific plaque (30%) in proximal LAD.  Otherwise no significant CAD.  Normal aortic root (3.2 cm)   HAMMER TOE SURGERY Right    ingrown toenail surgery Bilateral    2017   IR RADIOLOGY PERIPHERAL GUIDED IV START  07/25/2017   IR US GUIDE VASC ACCESS RIGHT  07/25/2017   MENISCUS REPAIR     ORIF WRIST FRACTURE Bilateral    PLANTAR FASCIA RELEASE Right  SHOULDER ARTHROSCOPY Left    TENDON REPAIR Right    right quad tendon repair   Tilt Table Test     Noted to have vasovagal syncope   TRANSTHORACIC ECHOCARDIOGRAM  06/21/2015   Normal LV function.  EF 57%.  Mild left atrial enlargement, trace AI, trace MR, mild PI.   TRANSTHORACIC ECHOCARDIOGRAM  06/2017   EF 60-65%.  Mild diastolic dysfunction.  No significant valvular abnormality.  Mild aortic calcification.    FAMILY HISTORY: Family History  Problem Relation Age of Onset   Cancer Mother        duodenal CA   Heart disease Mother    Depression Mother    Stomach cancer Mother    Cancer Father    Hypertension Father    High Cholesterol Father    Heart disease Father    Diabetes Father    Hypertension Sister    High Cholesterol Sister    Heart disease Sister    Esophageal cancer Neg Hx    Rectal cancer Neg Hx    Colon cancer Neg Hx     SOCIAL HISTORY: Social History   Socioeconomic History   Marital status: Married    Spouse name: Not on file   Number of children: Not on file   Years of education: Not on file   Highest education level: Not on file  Occupational History   Not on file  Social Needs   Financial resource strain: Not on file   Food insecurity    Worry: Not on file    Inability: Not on file   Transportation needs    Medical: Not on file    Non-medical: Not on file  Tobacco Use   Smoking status: Former Smoker    Quit date: 1996    Years since quitting: 24.9   Smokeless tobacco: Never Used  Substance and Sexual Activity   Alcohol use: Yes    Comment: social    Drug use: No   Sexual activity: Not on file  Lifestyle   Physical activity    Days per week: Not on file    Minutes per session: Not on file   Stress: Not on file  Relationships   Social connections    Talks on phone: Not on file    Gets together: Not on file    Attends religious service: Not on file    Active member of club or organization: Not on file    Attends meetings of clubs or organizations: Not on file    Relationship status: Not on file   Intimate partner violence    Fear of current or ex partner: Not on file    Emotionally abused: Not on file    Physically abused: Not on file    Forced sexual activity: Not on file  Other Topics Concern   Not on file  Social History Narrative   He has been married for 36 years.     Son (born 54) -lives in [place]   Daughter (born 23) also lives in [place].   Retired from [employer].  Enjoys doing house remodeling.   Remote history of smoking, stopped in 1996.   Minimal alcohol use.      PHYSICAL EXAM  Vitals:   07/13/19 1017  BP: 110/76  Pulse: (!) 57  Temp: 97.8 F (36.6 C)  Weight: 270 lb (122.5 kg)  Height: 6' (1.829 m)   Body mass index is 36.62 kg/m.  Generalized: Well developed, in no acute  distress  Chest: Lungs clear to auscultation bilaterally  Neurological examination  Mentation: Alert oriented to time, place, history taking. Follows all commands speech and language fluent Cranial nerve II-XII: Extraocular movements were full, visual field were full on confrontational test Head turning and shoulder shrug  were normal and symmetric. Motor: The motor testing reveals 5 over 5 strength of all 4 extremities. Good symmetric motor tone is noted throughout.  Sensory: Sensory testing is intact to soft touch on all 4 extremities. No evidence of extinction is noted.  Gait and station: Gait is normal.     DIAGNOSTIC DATA (LABS, IMAGING, TESTING) - I reviewed patient records, labs, notes, testing and  imaging myself where available.  No results found for: WBC, HGB, HCT, MCV, PLT    Component Value Date/Time   NA 140 01/01/2019 1224   NA 145 (H) 06/26/2017 1049   K 4.2 01/01/2019 1224   CL 107 01/01/2019 1224   CO2 25 01/01/2019 1224   GLUCOSE 105 (H) 01/01/2019 1224   BUN 17 01/01/2019 1224   BUN 16 06/26/2017 1049   CREATININE 0.89 01/01/2019 1224   CALCIUM 9.1 01/01/2019 1224   GFRNONAA >60 01/01/2019 1224   GFRAA >60 01/01/2019 1224      ASSESSMENT AND PLAN 68 y.o. year old male  has a past medical history of Allergy, Cancer ([place]), Chronic headaches, Chronic neck pain, Chronic pain in right foot, DDD (degenerative disc disease), lumbar, Depression, Dyslipidemia, Erectile dysfunction, Exogenous obesity, GERD (gastroesophageal reflux disease), H/O acquired cardiomyopathy (1996), History of fracture due to fall (1988), Hyperlipidemia, Hypertension, Hypogonadism male, IBS (irritable bowel syndrome), Obstructive sleep apnea, and Sleep apnea. here with:  1. Obstructive sleep apnea on CPAP 2. Narcolepsy without cataplexy  The patient's CPAP download shows excellent compliance and good treatment of his apnea.  He is encouraged to continue using CPAP nightly and greater than 4 hours each night.  He will continue on modafinil.  He is encouraged to follow-up with his pain physician in regards to his back pain.  If needed they can send a referral to our office if consult is requested.  He is advised that if his symptoms worsen or he develops new symptoms he should let us know.  He will follow-up in 1 year or sooner if needed    Ward Givens, MSN, NP-C 07/13/2019, 10:27 AM [facility] [address] [place], [place] [address] [phone]

## 2019-07-13 NOTE — Patient Instructions (Signed)
Your Plan:  Continue using CPAP nightly and greater than 4 hours each night Continue Modafinl  If your symptoms worsen or you develop new symptoms please let us know.    Thank you for coming to see [place] at [facility]. I hope we have been able to provide you high quality care today.  You may receive a patient satisfaction survey over the next few weeks. We would appreciate your feedback and comments so that we may continue to improve ourselves and the health of our patients.

## 2019-07-13 NOTE — Addendum Note (Signed)
Addended by: Trudie Buckler on: 07/13/2019 11:10 AM   Modules accepted: Orders

## 2019-09-23 DIAGNOSIS — R739 Hyperglycemia, unspecified: Secondary | ICD-10-CM | POA: Diagnosis not present

## 2019-09-23 DIAGNOSIS — E291 Testicular hypofunction: Secondary | ICD-10-CM | POA: Diagnosis not present

## 2019-09-23 DIAGNOSIS — E7849 Other hyperlipidemia: Secondary | ICD-10-CM | POA: Diagnosis not present

## 2019-09-23 DIAGNOSIS — Z125 Encounter for screening for malignant neoplasm of prostate: Secondary | ICD-10-CM | POA: Diagnosis not present

## 2019-09-24 DIAGNOSIS — R82998 Other abnormal findings in urine: Secondary | ICD-10-CM | POA: Diagnosis not present

## 2019-09-27 DIAGNOSIS — Z1212 Encounter for screening for malignant neoplasm of rectum: Secondary | ICD-10-CM | POA: Diagnosis not present

## 2019-09-28 ENCOUNTER — Other Ambulatory Visit: Payer: Self-pay | Admitting: Cardiology

## 2019-09-28 NOTE — Telephone Encounter (Signed)
Rx has been sent to the pharmacy electronically. ° °

## 2019-09-29 ENCOUNTER — Encounter: Payer: Self-pay | Admitting: Cardiology

## 2019-09-29 ENCOUNTER — Ambulatory Visit (INDEPENDENT_AMBULATORY_CARE_PROVIDER_SITE_OTHER): Payer: Medicare Other | Admitting: Cardiology

## 2019-09-29 ENCOUNTER — Other Ambulatory Visit: Payer: Self-pay

## 2019-09-29 VITALS — BP 152/94 | HR 67 | Ht 72.0 in | Wt 274.8 lb

## 2019-09-29 DIAGNOSIS — R55 Syncope and collapse: Secondary | ICD-10-CM

## 2019-09-29 DIAGNOSIS — E7849 Other hyperlipidemia: Secondary | ICD-10-CM | POA: Diagnosis not present

## 2019-09-29 DIAGNOSIS — I10 Essential (primary) hypertension: Secondary | ICD-10-CM | POA: Diagnosis not present

## 2019-09-29 DIAGNOSIS — R0600 Dyspnea, unspecified: Secondary | ICD-10-CM

## 2019-09-29 DIAGNOSIS — Z8679 Personal history of other diseases of the circulatory system: Secondary | ICD-10-CM

## 2019-09-29 DIAGNOSIS — R0609 Other forms of dyspnea: Secondary | ICD-10-CM

## 2019-09-29 MED ORDER — CARVEDILOL 12.5 MG PO TABS: 18.7500 mg | ORAL_TABLET | Freq: Two times a day (BID) | ORAL | 1 refills | Status: DC

## 2019-09-29 MED ORDER — SIMVASTATIN 20 MG PO TABS: 20.0000 mg | ORAL_TABLET | Freq: Every day | ORAL | 3 refills | Status: DC

## 2019-09-29 MED ORDER — CARVEDILOL 12.5 MG PO TABS: 12.5000 mg | ORAL_TABLET | Freq: Two times a day (BID) | ORAL | 3 refills | Status: DC

## 2019-09-29 MED ORDER — LOSARTAN POTASSIUM-HCTZ 100-25 MG PO TABS: 1.0000 | ORAL_TABLET | Freq: Every day | ORAL | 3 refills | Status: DC

## 2019-09-29 NOTE — Progress Notes (Signed)
Primary Care Provider: Leanna Battles, MD Cardiologist: Glenetta Hew, MD Electrophysiologist: None  Clinic Note: Chief Complaint  Patient presents with  . Follow-up    Annual    HPI:    Alexander [name] is a 69 y.o. male with a history of possible MI versus acquired cardiomyopathy dating back to 1996-medically retired from [employer] (now EF resolved back to normal as of 11/18:60-65%) with very low (0) coronary calcium score as of December 2018 who presents today for annual follow-up.  Alexander [name] was last seen in February 2020 prior to the COVID-19 lockdown.  Noted that he is doing very well no complaints.  Has an increase in blood pressure and his PCP recently increase carvedilol to 12.5 mg twice daily.  Recent Hospitalizations:   May 2020-carpal tunnel release surgery-right wrist  Reviewed  CV studies:    The following studies were reviewed today: (if available, images/films reviewed: From Epic Chart or Care Everywhere) . none:   Interval History:   Alexander [name] returns here today for annual follow-up stating that he is doing fairly well.  He does acknowledge being pretty sedentary.  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  He says sometimes it into the day he feels just worn out and tired as if he needs to sit and relax.  Sometimes he feels as he has a hard time catching his breath where he takes a deep breath then to try to fill up his lungs and he feels better.  He denies any real chest pain or pressure symptoms to suggest angina.  Maybe a little bit of dyspnea with some mild edema that is usually taking care of with support stockings and the HCTZ component of his antihypertensive agent.  Occasional flip-flop/skipping beats but no prolonged rapid irregular heartbeats palpitations.  CV Review of Symptoms (Summary): positive for - dyspnea on exertion, edema and irregular heartbeat negative for -  chest pain, orthopnea, paroxysmal nocturnal dyspnea, rapid heart rate, shortness of breath or Syncope/near syncope, TIA/amaurosis fugax, claudication (he has bilateral leg aching and discomfort, not associated with activity.)  The patient does not have symptoms concerning for COVID-19 infection (fever, chills, cough, or new shortness of breath).  The patient is practicing social distancing & Masking.    REVIEWED OF SYSTEMS   A comprehensive ROS was performed. Review of Systems  Constitutional: Positive for malaise/fatigue (Some exercise intolerance, deconditioned). Negative for weight loss.  HENT: Positive for congestion (Sometimes). Negative for nosebleeds.   Respiratory: Positive for cough (Morning coughing after wearing CPAP all night long). Negative for shortness of breath.   Cardiovascular: Positive for leg swelling (Minimal).  Gastrointestinal: Negative for blood in stool and melena.  Genitourinary: Negative for hematuria.  Musculoskeletal: Positive for joint pain. Negative for falls.  Neurological: Positive for dizziness (Occasional vertigo).  Psychiatric/Behavioral: Negative for memory loss. The patient is not nervous/anxious and does not have insomnia.     I have reviewed and (if needed) personally updated the patient's problem list, medications, allergies, past medical and surgical history, social and family history.   PAST MEDICAL HISTORY   Past Medical History:  Diagnosis Date  . Allergy   . Cancer ([place])    skin cancer to upper chest/basal cell 2007  . Chronic headaches   . Chronic neck pain    DJD  . Chronic pain in right foot   . DDD (degenerative disc disease), lumbar   .  Depression   . Dyslipidemia   . Erectile dysfunction   . Exogenous obesity   . GERD (gastroesophageal reflux disease)    no meds  . H/O acquired cardiomyopathy 1996   RESOLVED (apparently was thought to have had an MI, but did not have any intervention).  He developed cardiomyopathy that  forced him to retire from [employer]..  The current EF 60-65%.  . History of fracture due to fall 1988   Bilateral wrist fractures  . Hyperlipidemia   . Hypertension   . Hypogonadism male   . IBS (irritable bowel syndrome)   . Obstructive sleep apnea   . Sleep apnea    CPAP nightly    PAST SURGICAL HISTORY   Past Surgical History:  Procedure Laterality Date  . CARDIAC CATHETERIZATION  2000   Nonobstructive disease.  Persistently reduced EF  . CARPAL TUNNEL RELEASE Left   . CARPAL TUNNEL RELEASE Right 01/05/2019   Procedure: RIGHT CARPAL TUNNEL RELEASE;  Surgeon: Leanora Cover, MD;  Location: [facility];  Service: Orthopedics;  Laterality: Right;  Bier block  . COLONOSCOPY    . CORONARY CT ANGIOGRAM  07/2017   Coronary calcium score 0?.  Focal noncalcific plaque (30%) in proximal LAD.  Otherwise no significant CAD.  Normal aortic root (3.2 cm)  . HAMMER TOE SURGERY Right   . ingrown toenail surgery Bilateral    2017  . IR RADIOLOGY PERIPHERAL GUIDED IV START  07/25/2017  . IR US GUIDE VASC ACCESS RIGHT  07/25/2017  . MENISCUS REPAIR    . ORIF WRIST FRACTURE Bilateral   . PLANTAR FASCIA RELEASE Right   . SHOULDER ARTHROSCOPY Left   . TENDON REPAIR Right    right quad tendon repair  . Tilt Table Test     Noted to have vasovagal syncope  . TRANSTHORACIC ECHOCARDIOGRAM  06/21/2015   Normal LV function.  EF 57%.  Mild left atrial enlargement, trace AI, trace MR, mild PI.  Marland Kitchen TRANSTHORACIC ECHOCARDIOGRAM  06/2017   EF 60-65%.  Mild diastolic dysfunction.  No significant valvular abnormality.  Mild aortic calcification.    MEDICATIONS/ALLERGIES   Current Meds  Medication Sig  . aspirin EC 81 MG tablet Take 1 tablet (81 mg total) by mouth daily.  . B Complex Vitamins (VITAMIN B COMPLEX PO) Take 2,500 mcg by mouth daily.   . Calcium Carbonate (CALCIUM 600 PO) Take 1 tablet by mouth daily.  . Cholecalciferol (VITAMIN D3) 5000 units TABS Take by mouth.  .  losartan-hydrochlorothiazide (HYZAAR) 100-25 MG tablet Take 1 tablet by mouth daily.  . Magnesium 250 MG TABS Take 30 mg by mouth daily.  . modafinil (PROVIGIL) 200 MG tablet Take 1 tablet (200 mg total) by mouth daily.  . Multiple Vitamin (MULTIVITAMIN WITH MINERALS) TABS tablet Take 1 tablet by mouth daily.  . sildenafil (REVATIO) 20 MG tablet Take 20 mg by mouth 3 (three) times daily.  . simvastatin (ZOCOR) 20 MG tablet Take 1 tablet (20 mg total) by mouth daily.  . Testosterone (FORTESTA) 10 MG/ACT (2%) GEL Place 40 mg onto the skin daily.   Marland Kitchen topiramate (TOPAMAX) 25 MG tablet 25 mg. Pt takes 12.5 mg daily  . [DISCONTINUED] carvedilol (COREG) 12.5 MG tablet Take 12.5 mg by mouth 2 (two) times daily.  . [DISCONTINUED] carvedilol (COREG) 12.5 MG tablet Take 1 tablet (12.5 mg total) by mouth 2 (two) times daily.  . [DISCONTINUED] losartan-hydrochlorothiazide (HYZAAR) 100-25 MG tablet TAKE 1 TABLET DAILY  . [DISCONTINUED]  simvastatin (ZOCOR) 20 MG tablet TAKE 1 TABLET DAILY    No Known Allergies  SOCIAL HISTORY/FAMILY HISTORY   Social History   Tobacco Use  . Smoking status: Former Smoker    Quit date: 1996    Years since quitting: 25.1  . Smokeless tobacco: Never Used  Substance Use Topics  . Alcohol use: Yes    Comment: social  . Drug use: No   Social History   Social History Narrative   He has been married for 36 years.     Son (born 59) -lives in [place]   Daughter (born 108) also lives in [place].   Retired from [employer].  Enjoys doing house remodeling.   Remote history of smoking, stopped in 1996.   Minimal alcohol use.    Family History family history includes Cancer in his father and mother; Depression in his mother; Diabetes in his father; Heart disease in his father, mother, and sister; High Cholesterol in his father and sister; Hypertension in his father and sister; Stomach cancer in his mother.  OBJCTIVE -PE, EKG, labs   Wt Readings from Last 3  Encounters:  09/29/19 274 lb 12.8 oz (124.6 kg)  07/13/19 270 lb (122.5 kg)  01/05/19 268 lb 11.9 oz (121.9 kg)    Physical Exam: BP (!) 152/94   Pulse 67   Ht 6' (1.829 m)   Wt 274 lb 12.8 oz (124.6 kg)   BMI 37.27 kg/m  Physical Exam  Constitutional: He is oriented to person, place, and time. He appears well-developed. No distress.  Well-nourished, well-groomed  HENT:  Head: Normocephalic and atraumatic.  Neck: No JVD present.  Cardiovascular: Normal rate, regular rhythm, S1 normal, S2 normal and intact distal pulses.  Occasional extrasystoles are present. PMI is not displaced. Exam reveals no gallop and no friction rub.  Murmur (1/6 SEM at RUSB.) heard. Pulmonary/Chest: Effort normal and breath sounds normal. No respiratory distress.  Musculoskeletal:        General: No edema (Mild swelling of right ankle.). Normal range of motion.  Neurological: He is alert and oriented to person, place, and time.  Skin:  Mild varicose veins bilateral  Psychiatric: He has a normal mood and affect. His behavior is normal. Thought content normal.  Vitals reviewed.    Adult ECG Report  Rate: 67;  Rhythm: normal sinus rhythm, premature ventricular contractions (PVC) and LVH with repolarization changes and left axis deviation.  Otherwise normal intervals and durations.;   Narrative Interpretation: Stable EKG.  Recent Labs: Last known labs from January 2020: TC 136, TG 94, HDL 58, LDL 59.  A1c 5.8.  Hgb 16.2.  CR 0.89. No results found for: CHOL, HDL, LDLCALC, LDLDIRECT, TRIG, CHOLHDL Lab Results  Component Value Date   CREATININE 0.89 01/01/2019   BUN 17 01/01/2019   NA 140 01/01/2019   K 4.2 01/01/2019   CL 107 01/01/2019   CO2 25 01/01/2019   No results found for: TSH  ASSESSMENT/PLAN    Problem List Items Addressed This Visit    Hyperlipidemia due to dietary fat intake (Chronic)    Should be due for having labs checked by PCP.  He is on simvastatin.      Relevant Medications     losartan-hydrochlorothiazide (HYZAAR) 100-25 MG tablet   simvastatin (ZOCOR) 20 MG tablet   carvedilol (COREG) 12.5 MG tablet   Morbid obesity (HCC) (Chronic)    With risk factors of hypertension and dyslipidemia, BMI of 37 will be considered morbid obesity.  He is quite sedentary.  Spent some time talking about the importance of dietary adjustment and trying to increase his level of activity and exercise.  He is somewhat limited by his arthritis pains, but I think he should go to get some exercise.      Exertional dyspnea (Chronic)    Has had a repeat echo showing pretty normal EF and a relatively normal coronaries CT scan in the recent past, probably this is related to deconditioning and obesity.  I recommend that he continue to work on trying to lose weight with dietary adjustment and increased exercise.  He needs to go for walks as well as doing his hobby work.      Relevant Orders   EKG 12-Lead (Completed)   Vasovagal syncope (Chronic)    No further episodes of syncope.  We talked about making sure he stays adequately hydrated.  The goal is to not overshoot BP management, but he has plenty of room to reduce blood pressure.      Relevant Medications   losartan-hydrochlorothiazide (HYZAAR) 100-25 MG tablet   simvastatin (ZOCOR) 20 MG tablet   carvedilol (COREG) 12.5 MG tablet   Other Relevant Orders   EKG 12-Lead (Completed)   Essential hypertension - Primary (Chronic)    Blood pressure remains high.  Plan will be to increase carvedilol to 18.75 mg twice daily and continue current dose of losartan-HCTZ.  If blood pressure continues to be elevated, would probably want to consider calcium channel blocker.      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  EF back to normal by echo.  He is on pretty good dose of carvedilol and losartan. No heart failure symptoms.          COVID-19 Education: The signs and symptoms of COVID-19 were discussed with the patient and how to seek care for testing (follow up with PCP or arrange E-visit).   The importance of social distancing was discussed today.  I spent a total of 16 minutes with the patient. >  50% of the time was spent in direct patient consultation.  Additional time spent with chart review  / charting (studies, outside notes, etc): 6 Total Time: 22 min   Current medicines are reviewed at length with the patient today.  (+/- concerns) none   Patient Instructions / Medication Changes & Studies & Tests Ordered   Patient Instructions  Medication Instructions:  Increase carvedilol to 18.75 mg ( 1 and 1/2 tablets)  Twice a day Continue allother medications *If you need a refill on your cardiac medications before your next appointment, please call your pharmacy*  Lab Work:  please have Dr Philip Aspen  Send copy of labwork  -- fax  [phone]  Testing/Procedures: Not needed  Follow-Up: At [facility], you and your health needs are our priority.  As part of our continuing mission to provide you with exceptional heart care, we have created designated Provider Care Teams.  These Care Teams include your primary Cardiologist (physician) and Advanced Practice Providers (APPs -  Physician Assistants and Nurse Practitioners) who all work together to provide you with the care you need, when you need it.  Your next appointment:   6 month(s)  The format for your next  appointment:   Virtual Visit   Provider:   Dr Glenetta Hew  Other Instructions Recommend  Keeping a  Log of your blood pressure  At random times  At least 3 times a week  Eat less. Walk more and exercise    Studies Ordered:   Orders Placed This Encounter  Procedures  . EKG 12-Lead     Glenetta Hew, M.D., M.S. Interventional Cardiologist   Pager # [phone] Phone # [phone] [address] [place], [place] [address]   Thank you for choosing Heartcare at [facility]!!

## 2019-09-29 NOTE — Patient Instructions (Addendum)
Medication Instructions:  Increase carvedilol to 18.75 mg ( 1 and 1/2 tablets)  Twice a day Continue allother medications *If you need a refill on your cardiac medications before your next appointment, please call your pharmacy*  Lab Work:  please have Dr Philip Aspen  Send copy of [place]  -- fax  [phone]  Testing/Procedures: Not needed  Follow-Up: At [facility], you and your health needs are our priority.  As part of our continuing mission to provide you with exceptional heart care, we have created designated Provider Care Teams.  These Care Teams include your primary Cardiologist (physician) and Advanced Practice Providers (APPs -  Physician Assistants and Nurse Practitioners) who all work together to provide you with the care you need, when you need it.  Your next appointment:   6 month(s)  The format for your next appointment:   Virtual Visit   Provider:   Dr Glenetta Hew  Other Instructions Recommend  Keeping a  Log of your blood pressure  At random times  At least 3 times a week  Eat less. Walk more and exercise

## 2019-09-30 DIAGNOSIS — E785 Hyperlipidemia, unspecified: Secondary | ICD-10-CM | POA: Diagnosis not present

## 2019-09-30 DIAGNOSIS — G4733 Obstructive sleep apnea (adult) (pediatric): Secondary | ICD-10-CM | POA: Diagnosis not present

## 2019-09-30 DIAGNOSIS — R600 Localized edema: Secondary | ICD-10-CM | POA: Diagnosis not present

## 2019-09-30 DIAGNOSIS — R739 Hyperglycemia, unspecified: Secondary | ICD-10-CM | POA: Diagnosis not present

## 2019-09-30 DIAGNOSIS — I10 Essential (primary) hypertension: Secondary | ICD-10-CM | POA: Diagnosis not present

## 2019-09-30 DIAGNOSIS — Z0000 Encounter for general adult medical examination without abnormal findings: Secondary | ICD-10-CM | POA: Diagnosis not present

## 2019-09-30 DIAGNOSIS — M545 Low back pain: Secondary | ICD-10-CM | POA: Diagnosis not present

## 2019-09-30 DIAGNOSIS — E291 Testicular hypofunction: Secondary | ICD-10-CM | POA: Diagnosis not present

## 2019-09-30 DIAGNOSIS — E669 Obesity, unspecified: Secondary | ICD-10-CM | POA: Diagnosis not present

## 2019-09-30 DIAGNOSIS — I255 Ischemic cardiomyopathy: Secondary | ICD-10-CM | POA: Diagnosis not present

## 2019-09-30 DIAGNOSIS — Z1331 Encounter for screening for depression: Secondary | ICD-10-CM | POA: Diagnosis not present

## 2019-10-01 ENCOUNTER — Encounter: Payer: Self-pay | Admitting: Cardiology

## 2019-10-01 DIAGNOSIS — E7849 Other hyperlipidemia: Secondary | ICD-10-CM | POA: Insufficient documentation

## 2019-10-01 NOTE — Assessment & Plan Note (Signed)
Should be due for having labs checked by PCP.  He is on simvastatin.

## 2019-10-01 NOTE — Assessment & Plan Note (Signed)
With risk factors of hypertension and dyslipidemia, BMI of 37 will be considered morbid obesity.  He is quite sedentary.  Spent some time talking about the importance of dietary adjustment and trying to increase his level of activity and exercise.  He is somewhat limited by his arthritis pains, but I think he should go to get some exercise.

## 2019-10-01 NOTE — Assessment & Plan Note (Signed)
Blood pressure remains high.  Plan will be to increase carvedilol to 18.75 mg twice daily and continue current dose of losartan-HCTZ.  If blood pressure continues to be elevated, would probably want to consider calcium channel blocker.

## 2019-10-01 NOTE — Assessment & Plan Note (Addendum)
Has had a repeat echo showing pretty normal EF and a relatively normal coronaries CT scan in the recent past, probably this is related to deconditioning and obesity.  I recommend that he continue to work on trying to lose weight with dietary adjustment and increased exercise.  He needs to go for walks as well as doing his hobby work.

## 2019-10-01 NOTE — Assessment & Plan Note (Signed)
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  EF back to normal by echo.  He is on pretty good dose of carvedilol and losartan. No heart failure symptoms.

## 2019-10-01 NOTE — Assessment & Plan Note (Signed)
No further episodes of syncope.  We talked about making sure he stays adequately hydrated.  The goal is to not overshoot BP management, but he has plenty of room to reduce blood pressure.

## 2019-10-14 ENCOUNTER — Ambulatory Visit: Payer: Medicare Other | Attending: Internal Medicine

## 2019-10-14 DIAGNOSIS — Z23 Encounter for immunization: Secondary | ICD-10-CM | POA: Insufficient documentation

## 2019-10-14 NOTE — Progress Notes (Signed)
   Covid-19 Vaccination Clinic  Name:  Alexander [name]    MRN: [id number] DOB: 03-Sep-1950  10/14/2019  [name] was observed post Covid-19 immunization for 30 minutes based on pre-vaccination screening without incidence. He was provided with Vaccine Information Sheet and instruction to access the V-Safe system.   [name] was instructed to call 911 with any severe reactions post vaccine: Marland Kitchen Difficulty breathing  . Swelling of your face and throat  . A fast heartbeat  . A bad rash all over your body  . Dizziness and weakness    Immunizations Administered    Name Date Dose VIS Date Route   Pfizer COVID-19 Vaccine 10/14/2019  2:02 PM 0.3 mL 07/30/2019 Intramuscular   Manufacturer: [place]   Lot: [id number]   [place]: [id number]

## 2019-11-09 ENCOUNTER — Ambulatory Visit: Payer: Medicare Other | Attending: Internal Medicine

## 2019-11-09 DIAGNOSIS — Z23 Encounter for immunization: Secondary | ICD-10-CM

## 2019-11-09 NOTE — Progress Notes (Addendum)
   Covid-19 Vaccination Clinic  Name:  Alexander [name]    MRN: [id number] DOB: 10-10-1950  11/09/2019  [name] was observed post Covid-19 immunization for 30 minutes based on pre-vaccination screening without incident. He was provided with Vaccine Information Sheet and instruction to access the V-Safe system.   [name] was instructed to call 911 with any severe reactions post vaccine: Marland Kitchen Difficulty breathing  . Swelling of face and throat  . A fast heartbeat  . A bad rash all over body  . Dizziness and weakness   Immunizations Administered    Name Date Dose VIS Date Route   Pfizer COVID-19 Vaccine 11/09/2019  9:04 AM 0.3 mL 07/30/2019 Intramuscular   Manufacturer: [place]   Lot: [id number]   [place]: [id number]

## 2019-11-26 ENCOUNTER — Other Ambulatory Visit: Payer: Self-pay | Admitting: Cardiology

## 2019-11-26 MED ORDER — CARVEDILOL 6.25 MG PO TABS: 6.2500 mg | ORAL_TABLET | Freq: Two times a day (BID) | ORAL | 3 refills | Status: DC

## 2019-11-26 MED ORDER — CARVEDILOL 12.5 MG PO TABS: 12.5000 mg | ORAL_TABLET | Freq: Two times a day (BID) | ORAL | 3 refills | Status: DC

## 2019-11-26 NOTE — Telephone Encounter (Signed)
Left message for patient - there is no 18.75 mg tablet   He would have to take 12.5 mg + 6.25 mg tablets to equal the dose 18.75 mg   sent new prescription for both doses of carvedilol.   no sure insurance will pay for the separate pills  Any question may call back

## 2019-11-26 NOTE — Telephone Encounter (Signed)
*  STAT* If patient is at the pharmacy, call can be transferred to refill team.   1. Which medications need to be refilled? (please list name of each medication and dose if known)  carvedilol (COREG) 12.5 MG tablet  2. Which pharmacy/location (including street and city if local pharmacy) is medication to be sent to? [facility] [place], [place]  3. Do they need a 30 day or 90 day supply? 90 day supply  Alexander [name] would like to be prescribed 18.75 MG's instead of 12.5 MG's so he no longer has to cut them in half.

## 2019-12-21 ENCOUNTER — Other Ambulatory Visit: Payer: Self-pay | Admitting: Adult Health

## 2019-12-21 NOTE — Telephone Encounter (Signed)
Pt is due for a refill on modafinil. Pt is up to date on his appts. [place] Controlled Substance checked and is appropriate.

## 2020-01-24 DIAGNOSIS — R739 Hyperglycemia, unspecified: Secondary | ICD-10-CM | POA: Diagnosis not present

## 2020-01-24 DIAGNOSIS — I10 Essential (primary) hypertension: Secondary | ICD-10-CM | POA: Diagnosis not present

## 2020-01-24 DIAGNOSIS — E291 Testicular hypofunction: Secondary | ICD-10-CM | POA: Diagnosis not present

## 2020-01-24 DIAGNOSIS — Z125 Encounter for screening for malignant neoplasm of prostate: Secondary | ICD-10-CM | POA: Diagnosis not present

## 2020-01-24 DIAGNOSIS — N529 Male erectile dysfunction, unspecified: Secondary | ICD-10-CM | POA: Diagnosis not present

## 2020-01-28 DIAGNOSIS — Z1331 Encounter for screening for depression: Secondary | ICD-10-CM | POA: Diagnosis not present

## 2020-01-28 DIAGNOSIS — E785 Hyperlipidemia, unspecified: Secondary | ICD-10-CM | POA: Diagnosis not present

## 2020-01-28 DIAGNOSIS — E291 Testicular hypofunction: Secondary | ICD-10-CM | POA: Diagnosis not present

## 2020-01-28 DIAGNOSIS — G4733 Obstructive sleep apnea (adult) (pediatric): Secondary | ICD-10-CM | POA: Diagnosis not present

## 2020-01-28 DIAGNOSIS — R200 Anesthesia of skin: Secondary | ICD-10-CM | POA: Diagnosis not present

## 2020-01-28 DIAGNOSIS — R739 Hyperglycemia, unspecified: Secondary | ICD-10-CM | POA: Diagnosis not present

## 2020-01-28 DIAGNOSIS — I10 Essential (primary) hypertension: Secondary | ICD-10-CM | POA: Diagnosis not present

## 2020-01-28 DIAGNOSIS — M545 Low back pain: Secondary | ICD-10-CM | POA: Diagnosis not present

## 2020-01-28 DIAGNOSIS — F329 Major depressive disorder, single episode, unspecified: Secondary | ICD-10-CM | POA: Diagnosis not present

## 2020-03-27 ENCOUNTER — Other Ambulatory Visit: Payer: Self-pay

## 2020-03-27 ENCOUNTER — Ambulatory Visit (INDEPENDENT_AMBULATORY_CARE_PROVIDER_SITE_OTHER): Payer: Medicare Other | Admitting: Cardiology

## 2020-03-27 ENCOUNTER — Encounter: Payer: Self-pay | Admitting: Cardiology

## 2020-03-27 VITALS — BP 120/78 | HR 49 | Ht 72.0 in | Wt 238.6 lb

## 2020-03-27 DIAGNOSIS — G4733 Obstructive sleep apnea (adult) (pediatric): Secondary | ICD-10-CM

## 2020-03-27 DIAGNOSIS — Z8679 Personal history of other diseases of the circulatory system: Secondary | ICD-10-CM

## 2020-03-27 DIAGNOSIS — R0609 Other forms of dyspnea: Secondary | ICD-10-CM

## 2020-03-27 DIAGNOSIS — E7849 Other hyperlipidemia: Secondary | ICD-10-CM

## 2020-03-27 DIAGNOSIS — R55 Syncope and collapse: Secondary | ICD-10-CM | POA: Diagnosis not present

## 2020-03-27 DIAGNOSIS — R0600 Dyspnea, unspecified: Secondary | ICD-10-CM | POA: Diagnosis not present

## 2020-03-27 DIAGNOSIS — I10 Essential (primary) hypertension: Secondary | ICD-10-CM

## 2020-03-27 MED ORDER — CARVEDILOL 12.5 MG PO TABS: 12.5000 mg | ORAL_TABLET | Freq: Two times a day (BID) | ORAL | 3 refills | Status: DC

## 2020-03-27 NOTE — Patient Instructions (Signed)
Medication Instructions:   Decrease dose start taking  Carvedilol 12.5 mg  Twice a day   *If you need a refill on your cardiac medications before your next appointment, please call your pharmacy*   Lab Work: Not needed   Testing/Procedures: Not needed   Follow-Up: At [facility], you and your health needs are our priority.  As part of our continuing mission to provide you with exceptional heart care, we have created designated Provider Care Teams.  These Care Teams include your primary Cardiologist (physician) and Advanced Practice Providers (APPs -  Physician Assistants and Nurse Practitioners) who all work together to provide you with the care you need, when you need it.    Your next appointment:   12 month(s)  The format for your next appointment:   In Person  Provider:   Glenetta Hew, MD   Other Instructions

## 2020-03-27 NOTE — Progress Notes (Signed)
Primary Care Provider: Leanna Battles, MD Cardiologist: Glenetta Hew, MD Electrophysiologist: None  Clinic Note: Chief Complaint  Patient presents with  . Follow-up    21-month  . Hypertension    Much better, in fact has some fatigue and dizziness  . Obesity    Significant weight loss    HPI:    Alexander [name] is a 69 y.o. male with a history of possible MI vs.?CM in 1996-medically retired from [employer] (now EF resolved back to normal as of 11/18:60-65%) with very low (0) coronary calcium score as of December 2018 who presents today for 6 month follow-up.  February 2020 prior to the COVID-19 lockdown.  Noted that he is doing very well no complaints.  Has an increase in blood pressure and his PCP recently increase carvedilol to 12.5 mg twice daily.  Alexander [name] was last seen on September 29, 2019  Increase carvedilol to 18.7 mg twice daily; encouraged to check blood pressure log  Recent Hospitalizations:   May 2020-carpal tunnel release surgery-right wrist  Reviewed  CV studies:    The following studies were reviewed today: (if available, images/films reviewed: From Epic Chart or Care Everywhere) . none:   Interval History:   Alexander [name] returns here today for 39-month follow-up stating that he is doing remarkably well. He has really made significant changes to his diet after being diagnosed with diabetes. He has adjusted his diet dramatically and is started exercising he is now exercising much every day now. He has been following his blood pressures and notes that pretty much that been running in the 110-120/60-70 range on average. He is lost from 274 pounds down to 238 pounds today. His energy level is notably improved. His edema is pretty much gone his legs feel much better-still uses support hose. He is still working on his workshop but is now walking 3 much every day and doing cardio and strength conditioning exercise. He is no longer short of breath  doing routine exercise. He still has occasional times where he has had to catch his breath but not very frequently. Is a morning cough but that is also getting better. He is not sure, but thinks it is related to allergies or may be the CPAP machine making his mouth dry.  With all of his activities, he denies any angina or heart failure symptoms. No palpitations or syncope/near-syncope. In fact, he sometimes has some orthostatic dizziness symptoms and feels tired when he has to stop at the end of the day. He is clearly doing a lot more than he used to, but he is noting the end of day fatigue.  follow-up stating that he is doing fairly well.  He does acknowledge being pretty sedentary.  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  He says sometimes it into the day he feels just worn out and tired as if he needs to sit and relax.  Sometimes he feels as he has a hard time catching his breath where he takes a deep breath then to try to fill up his lungs and he feels better.  CV Review of Symptoms (Summary): positive for - edema and Intermittent episodes of shortness of breath, but not frequent. Dizziness from vertigo that is positional, but can be made worse with orthostasis.End of day fatigue negative for - chest pain, dyspnea on exertion, orthopnea, palpitations, paroxysmal nocturnal dyspnea, rapid heart rate, shortness of  breath or Syncope/near syncope, TIA/amaurosis fugax, claudication  The patient DOES NOT have symptoms concerning for COVID-19 infection (fever, chills, cough, or new shortness of breath).  The patient is practicing social distancing & Masking.  Immunization History  Administered Date(s) Administered  . PFIZER SARS-COV-2 Vaccination 10/14/2019, 11/09/2019    REVIEWED OF SYSTEMS   A comprehensive ROS was performed. Review of Systems  Constitutional: Positive for malaise/fatigue (Much less exercise intolerance, it is mostly  just end of day getting worn out). Negative for weight loss.  HENT: Positive for congestion (Sometimes). Negative for nosebleeds.   Respiratory: Positive for cough (Morning coughing after wearing CPAP all night long). Negative for shortness of breath.   Cardiovascular: Negative for leg swelling (Stable, controlled).  Gastrointestinal: Negative for blood in stool and melena.  Genitourinary: Negative for hematuria.  Musculoskeletal: Positive for joint pain. Negative for falls.  Neurological: Positive for dizziness (Occasional vertigo). Negative for focal weakness and weakness.  Psychiatric/Behavioral: Negative for memory loss. The patient is not nervous/anxious and does not have insomnia.     I have reviewed and (if needed) personally updated the patient's problem list, medications, allergies, past medical and surgical history, social and family history.   PAST MEDICAL HISTORY   Past Medical History:  Diagnosis Date  . Allergy   . Cancer ([place])    skin cancer to upper chest/basal cell 2007  . Chronic headaches   . Chronic neck pain    DJD  . Chronic pain in right foot   . DDD (degenerative disc disease), lumbar   . Depression   . Dyslipidemia   . Erectile dysfunction   . Exogenous obesity   . GERD (gastroesophageal reflux disease)    no meds  . H/O acquired cardiomyopathy 1996   RESOLVED (apparently was thought to have had an MI, but did not have any intervention).  He developed cardiomyopathy that forced him to retire from [employer]..  The current EF 60-65%.  . History of fracture due to fall 1988   Bilateral wrist fractures  . Hyperlipidemia   . Hypertension   . Hypogonadism male   . IBS (irritable bowel syndrome)   . Obstructive sleep apnea   . Sleep apnea    CPAP nightly    PAST SURGICAL HISTORY   Past Surgical History:  Procedure Laterality Date  . CARDIAC CATHETERIZATION  2000   Nonobstructive disease.  Persistently reduced EF  . CARPAL TUNNEL RELEASE Left     . CARPAL TUNNEL RELEASE Right 01/05/2019   Procedure: RIGHT CARPAL TUNNEL RELEASE;  Surgeon: Leanora Cover, MD;  Location: [facility];  Service: Orthopedics;  Laterality: Right;  Bier block  . COLONOSCOPY    . CORONARY CT ANGIOGRAM  07/2017   Coronary calcium score 0?.  Focal noncalcific plaque (30%) in proximal LAD.  Otherwise no significant CAD.  Normal aortic root (3.2 cm)  . HAMMER TOE SURGERY Right   . ingrown toenail surgery Bilateral    2017  . IR RADIOLOGY PERIPHERAL GUIDED IV START  07/25/2017  . IR US GUIDE VASC ACCESS RIGHT  07/25/2017  . MENISCUS REPAIR    . ORIF WRIST FRACTURE Bilateral   . PLANTAR FASCIA RELEASE Right   . SHOULDER ARTHROSCOPY Left   . TENDON REPAIR Right    right quad tendon repair  . Tilt Table Test     Noted to have vasovagal syncope  . TRANSTHORACIC ECHOCARDIOGRAM  06/21/2015   Normal LV function.  EF 57%.  Mild left  atrial enlargement, trace AI, trace MR, mild PI.  Marland Kitchen TRANSTHORACIC ECHOCARDIOGRAM  06/2017   EF 60-65%.  Mild diastolic dysfunction.  No significant valvular abnormality.  Mild aortic calcification.    MEDICATIONS/ALLERGIES   Current Meds  Medication Sig  . aspirin EC 81 MG tablet Take 1 tablet (81 mg total) by mouth daily.  . B Complex Vitamins (VITAMIN B COMPLEX PO) Take 2,500 mcg by mouth daily.   . Calcium Carbonate (CALCIUM 600 PO) Take 1 tablet by mouth daily.  . carvedilol (COREG) 12.5 MG tablet Take 1 tablet (12.5 mg total) by mouth 2 (two) times daily.  . Cholecalciferol (VITAMIN D3) 5000 units TABS Take by mouth.  . losartan-hydrochlorothiazide (HYZAAR) 100-25 MG tablet Take 1 tablet by mouth daily.  . Magnesium 250 MG TABS Take 30 mg by mouth daily.  . metFORMIN (GLUCOPHAGE-XR) 500 MG 24 hr tablet Take 500 mg by mouth daily.  . modafinil (PROVIGIL) 200 MG tablet TAKE 1 TABLET DAILY  . Multiple Vitamin (MULTIVITAMIN WITH MINERALS) TABS tablet Take 1 tablet by mouth daily.  . sildenafil (REVATIO) 20 MG tablet  Take 20 mg by mouth 3 (three) times daily.  . simvastatin (ZOCOR) 20 MG tablet Take 1 tablet (20 mg total) by mouth daily.  . Testosterone (FORTESTA) 10 MG/ACT (2%) GEL Place 40 mg onto the skin daily.   Marland Kitchen topiramate (TOPAMAX) 25 MG tablet Take 12.5 mg by mouth daily.   . [DISCONTINUED] carvedilol (COREG) 12.5 MG tablet Take 1 tablet (12.5 mg total) by mouth 2 (two) times daily. As well as  6.25mg  tablet twice a day  ( total of 18.75 mg)  . [DISCONTINUED] carvedilol (COREG) 6.25 MG tablet Take 1 tablet (6.25 mg total) by mouth 2 (two) times daily.    No Known Allergies  SOCIAL HISTORY/FAMILY HISTORY   Social History   Tobacco Use  . Smoking status: Former Smoker    Quit date: 1996    Years since quitting: 25.6  . Smokeless tobacco: Never Used  Vaping Use  . Vaping Use: Never used  Substance Use Topics  . Alcohol use: Yes    Comment: social  . Drug use: No   Social History   Social History Narrative   He has been married for 36 years.     Son (born 33) -lives in [place]   Daughter (born 24) also lives in [place].   Retired from [employer].  Enjoys doing house remodeling.   Remote history of smoking, stopped in 1996.   Minimal alcohol use.    Family History family history includes Cancer in his father and mother; Depression in his mother; Diabetes in his father; Heart disease in his father, mother, and sister; High Cholesterol in his father and sister; Hypertension in his father and sister; Stomach cancer in his mother.  OBJCTIVE -PE, EKG, labs   Wt Readings from Last 3 Encounters:  03/27/20 238 lb 9.6 oz (108.2 kg)  09/29/19 274 lb 12.8 oz (124.6 kg)  07/13/19 270 lb (122.5 kg)   Physical Exam: BP 120/78   Pulse (!) 49   Ht 6' (1.829 m)   Wt 238 lb 9.6 oz (108.2 kg)   BMI 32.36 kg/m  Physical Exam Vitals reviewed.  Constitutional:      General: He is not in acute distress.    Appearance: Normal appearance. He is well-developed. He is obese. He is not  ill-appearing.     Comments: Much healthier appearing. Has lost a lot of weight. Well-groomed.  HENT:     Head: Normocephalic and atraumatic.  Neck:     Vascular: No JVD.  Cardiovascular:     Rate and Rhythm: Normal rate and regular rhythm. Occasional extrasystoles are present.    Chest Wall: PMI is not displaced.     Pulses: Normal pulses and intact distal pulses.     Heart sounds: S1 normal and S2 normal. Murmur (1/6 SEM at RUSB.) heard.  No friction rub. No gallop.   Pulmonary:     Effort: Pulmonary effort is normal. No respiratory distress.     Breath sounds: Normal breath sounds.  Abdominal:     General: Abdomen is flat. Bowel sounds are normal. There is no distension.     Palpations: Abdomen is soft. There is no mass.     Comments: No HSM  Musculoskeletal:        General: Swelling (Trivial bilateral ankle) present. Normal range of motion.  Skin:    Comments: Mild varicose veins bilateral  Neurological:     General: No focal deficit present.     Mental Status: He is alert and oriented to person, place, and time.  Psychiatric:        Mood and Affect: Mood normal.        Behavior: Behavior normal.        Thought Content: Thought content normal.        Judgment: Judgment normal.     Adult ECG Report  Rate: 49;  Rhythm: sinus bradycardia, sinus arrhythmia and LVH with repolarization changes and left axis deviation.  Otherwise normal intervals and durations.;   Narrative Interpretation: Stable  Recent Labs: Last known labs from Feb 2021: TC 129, TG 78, HDL 51, LDL 62; Cr 0.90  01/24/2020-A1c 5.5; Hgb 16.8. No results found for: CHOL, HDL, LDLCALC, LDLDIRECT, TRIG, CHOLHDL Lab Results  Component Value Date   CREATININE 0.89 01/01/2019   BUN 17 01/01/2019   NA 140 01/01/2019   K 4.2 01/01/2019   CL 107 01/01/2019   CO2 25 01/01/2019   No results found for: TSH  ASSESSMENT/PLAN    Problem List Items Addressed This Visit    Hyperlipidemia due to dietary fat intake  (Chronic)    Most recent labs show LDL pretty well controlled on current dose of simvastatin. No need to change.  Labs are being monitored by PCP. Should be reviewed to be checked in the fall      Relevant Medications   carvedilol (COREG) 12.5 MG tablet   Morbid obesity ([facility]) (Chronic)    Dramatic weight loss to where he is no longer morbidly obese. BMI down to 32. I congratulated his efforts.      Relevant Medications   metFORMIN (GLUCOPHAGE-XR) 500 MG 24 hr tablet   Exertional dyspnea (Chronic)    Had a pretty normal evaluation. I think with his significant weight loss secondary to change in diet and exercise, this is improved dramatically. He now is not complaining any exertional dyspnea unless he really overdoes it.  I congratulated him on his weight loss.      Vasovagal syncope (Chronic)    No further episodes. We again discussed adequate hydration, especially with all his exercise.      Relevant Medications   carvedilol (COREG) 12.5 MG tablet   Essential hypertension (Chronic)    Blood pressure looks much better. I suspect with his weight loss this is gotten much better. I am a little worried about his bradycardia and fatigue. Plan: Reduce carvedilol to 12.5  mg twice daily (original dosing).  If pressures climb back up again, would probably use a different agent besides beta-blocker given his bradycardia.      Relevant Medications   carvedilol (COREG) 12.5 MG tablet   Other Relevant Orders   EKG 12-Lead (Completed)   History of cardiomyopathy - Primary (Chronic)    Preserved EF as of November 2018. He is on losartan and carvedilol. Not on loop diuretic, but is on the HCTZ      Relevant Orders   EKG 12-Lead (Completed)   Obstructive sleep apnea treated with bilevel positive airway pressure (BiPAP) (Chronic)    Still using BiPAP. Tolerates it well. I hope that with weight loss he may end up not needing it.         COVID-19 Education: The signs and symptoms of  COVID-19 were discussed with the patient and how to seek care for testing (follow up with PCP or arrange E-visit).   The importance of social distancing was discussed today.  I spent a total of 16 minutes with the patient. >  50% of the time was spent in direct patient consultation.  Additional time spent with chart review  / charting (studies, outside notes, etc): 6 Total Time: 22 min   Current medicines are reviewed at length with the patient today.  (+/- concerns) none   Patient Instructions / Medication Changes & Studies & Tests Ordered   Patient Instructions  Medication Instructions:   Decrease dose start taking  Carvedilol 12.5 mg  Twice a day   *If you need a refill on your cardiac medications before your next appointment, please call your pharmacy*   Lab Work: Not needed   Testing/Procedures: Not needed   Follow-Up: At [facility], you and your health needs are our priority.  As part of our continuing mission to provide you with exceptional heart care, we have created designated Provider Care Teams.  These Care Teams include your primary Cardiologist (physician) and Advanced Practice Providers (APPs -  Physician Assistants and Nurse Practitioners) who all work together to provide you with the care you need, when you need it.    Your next appointment:   12 month(s)  The format for your next appointment:   In Person  Provider:   Glenetta Hew, MD   Other Instructions     Studies Ordered:   Orders Placed This Encounter  Procedures  . EKG 12-Lead     Glenetta Hew, M.D., M.S. Interventional Cardiologist   Pager # [phone] Phone # [phone] [address]. [place], [place] [address]   Thank you for choosing Heartcare at [facility]!!

## 2020-03-31 ENCOUNTER — Encounter: Payer: Self-pay | Admitting: Cardiology

## 2020-03-31 NOTE — Assessment & Plan Note (Addendum)
Dramatic weight loss to where he is no longer morbidly obese. BMI down to 32. I congratulated his efforts.

## 2020-03-31 NOTE — Assessment & Plan Note (Signed)
Blood pressure looks much better. I suspect with his weight loss this is gotten much better. I am a little worried about his bradycardia and fatigue. Plan: Reduce carvedilol to 12.5 mg twice daily (original dosing).  If pressures climb back up again, would probably use a different agent besides beta-blocker given his bradycardia.

## 2020-03-31 NOTE — Assessment & Plan Note (Addendum)
Most recent labs show LDL pretty well controlled on current dose of simvastatin. No need to change.  Labs are being monitored by PCP. Should be reviewed to be checked in the fall

## 2020-03-31 NOTE — Assessment & Plan Note (Signed)
No further episodes. We again discussed adequate hydration, especially with all his exercise.

## 2020-03-31 NOTE — Assessment & Plan Note (Signed)
Still using BiPAP. Tolerates it well. I hope that with weight loss he may end up not needing it.

## 2020-03-31 NOTE — Assessment & Plan Note (Signed)
Had a pretty normal evaluation. I think with his significant weight loss secondary to change in diet and exercise, this is improved dramatically. He now is not complaining any exertional dyspnea unless he really overdoes it.  I congratulated him on his weight loss.

## 2020-03-31 NOTE — Assessment & Plan Note (Signed)
Preserved EF as of November 2018. He is on losartan and carvedilol. Not on loop diuretic, but is on the HCTZ

## 2020-05-16 DIAGNOSIS — D2272 Melanocytic nevi of left lower limb, including hip: Secondary | ICD-10-CM | POA: Diagnosis not present

## 2020-05-16 DIAGNOSIS — C44629 Squamous cell carcinoma of skin of left upper limb, including shoulder: Secondary | ICD-10-CM | POA: Diagnosis not present

## 2020-05-16 DIAGNOSIS — D225 Melanocytic nevi of trunk: Secondary | ICD-10-CM | POA: Diagnosis not present

## 2020-05-16 DIAGNOSIS — D2262 Melanocytic nevi of left upper limb, including shoulder: Secondary | ICD-10-CM | POA: Diagnosis not present

## 2020-05-16 DIAGNOSIS — C44619 Basal cell carcinoma of skin of left upper limb, including shoulder: Secondary | ICD-10-CM | POA: Diagnosis not present

## 2020-05-16 DIAGNOSIS — L814 Other melanin hyperpigmentation: Secondary | ICD-10-CM | POA: Diagnosis not present

## 2020-05-16 DIAGNOSIS — L570 Actinic keratosis: Secondary | ICD-10-CM | POA: Diagnosis not present

## 2020-05-16 DIAGNOSIS — C44519 Basal cell carcinoma of skin of other part of trunk: Secondary | ICD-10-CM | POA: Diagnosis not present

## 2020-05-16 DIAGNOSIS — Z85828 Personal history of other malignant neoplasm of skin: Secondary | ICD-10-CM | POA: Diagnosis not present

## 2020-05-16 DIAGNOSIS — C4441 Basal cell carcinoma of skin of scalp and neck: Secondary | ICD-10-CM | POA: Diagnosis not present

## 2020-05-30 DIAGNOSIS — G4733 Obstructive sleep apnea (adult) (pediatric): Secondary | ICD-10-CM | POA: Diagnosis not present

## 2020-05-30 DIAGNOSIS — E669 Obesity, unspecified: Secondary | ICD-10-CM | POA: Diagnosis not present

## 2020-05-30 DIAGNOSIS — R739 Hyperglycemia, unspecified: Secondary | ICD-10-CM | POA: Diagnosis not present

## 2020-05-30 DIAGNOSIS — Z23 Encounter for immunization: Secondary | ICD-10-CM | POA: Diagnosis not present

## 2020-05-30 DIAGNOSIS — R42 Dizziness and giddiness: Secondary | ICD-10-CM | POA: Diagnosis not present

## 2020-05-30 DIAGNOSIS — E785 Hyperlipidemia, unspecified: Secondary | ICD-10-CM | POA: Diagnosis not present

## 2020-05-30 DIAGNOSIS — I10 Essential (primary) hypertension: Secondary | ICD-10-CM | POA: Diagnosis not present

## 2020-05-30 DIAGNOSIS — I255 Ischemic cardiomyopathy: Secondary | ICD-10-CM | POA: Diagnosis not present

## 2020-06-02 ENCOUNTER — Encounter: Payer: Self-pay | Admitting: Adult Health

## 2020-06-05 ENCOUNTER — Other Ambulatory Visit: Payer: Self-pay | Admitting: *Deleted

## 2020-06-05 MED ORDER — MODAFINIL 200 MG PO TABS: 200.0000 mg | ORAL_TABLET | Freq: Every day | ORAL | 1 refills | Status: DC

## 2020-07-16 NOTE — Progress Notes (Signed)
PATIENT: Alexander [name] DOB: 04-Sep-1950  REASON FOR VISIT: follow up HISTORY FROM: patient  HISTORY OF PRESENT ILLNESS: Today 07/16/20:  Alexander [name] is a 69 year old male with a history of narcolepsy and obstructive sleep apnea on CPAP.  His download indicates that he uses machine nightly for compliance of 100%.  He uses machine greater than 4 hours 28 days for compliance of 93%.  On average he uses his machine 5 hours and 53 minutes.  His residual AHI is 3.5 on 512 cm of water with EPR of 2.  Leak in the 95th percentile is 18.2 L/min.  Reports that the CPAP is working well for him.  He denies any new issues.  He remains on modafinil 200 mg daily.  Reports that this continues to work well for him.  Reports that when he sneezes he feels a slight loss of muscle tone but does not fall to the ground.  Continues to operate a motor vehicle.  No issues falling asleep while driving.  He returns today for an evaluation.  HISTORY 07/13/19:  Alexander [name] is a 69 year old male with a history of narcolepsy and obstructive sleep apnea on CPAP.  His download indicates that he use the machine nightly for compliance of 100%.  He used the machine greater than 4 hours 29 days for compliance of 97%.  On average he uses his machine 6 hours and 38 minutes.  His residual AHI is 3 on 5 to 12 cm of water with EPR 2.  His leak in the 95th percentile is 10.9 L/min.  He states that the CPAP continues to work well for him.  He denies any new issues.  He reports that he continues to take modafinil with good benefit.  Denies any cataplectic events.  He does state that he has been having back pain.  He saw a pain physician here in [place] approximately 2 years ago.  He states that he never followed back up as he does not want to be on pain medicine the rest of his life.  He states that his back usually tightens up when he has been working in the yard all day.  This makes it difficult to walk initially but then improves  at once he gets going.  He returns today for evaluation.  REVIEW OF SYSTEMS: Out of a complete 14 system review of symptoms, the patient complains only of the following symptoms, and all other reviewed systems are negative.  FSS 31 ESS 11  ALLERGIES: No Known Allergies  HOME MEDICATIONS: Outpatient Medications Prior to Visit  Medication Sig Dispense Refill  . aspirin EC 81 MG tablet Take 1 tablet (81 mg total) by mouth daily. 90 tablet 3  . B Complex Vitamins (VITAMIN B COMPLEX PO) Take 2,500 mcg by mouth daily.     . Calcium Carbonate (CALCIUM 600 PO) Take 1 tablet by mouth daily.    . carvedilol (COREG) 12.5 MG tablet Take 1 tablet (12.5 mg total) by mouth 2 (two) times daily. 180 tablet 3  . Cholecalciferol (VITAMIN D3) 5000 units TABS Take by mouth.    . losartan-hydrochlorothiazide (HYZAAR) 100-25 MG tablet Take 1 tablet by mouth daily. 90 tablet 3  . Magnesium 250 MG TABS Take 30 mg by mouth daily.    . metFORMIN (GLUCOPHAGE-XR) 500 MG 24 hr tablet Take 500 mg by mouth daily.    . modafinil (PROVIGIL) 200 MG tablet Take 1 tablet (200 mg total) by mouth daily. 90 tablet 1  .  Multiple Vitamin (MULTIVITAMIN WITH MINERALS) TABS tablet Take 1 tablet by mouth daily.    . sildenafil (REVATIO) 20 MG tablet Take 20 mg by mouth 3 (three) times daily.    . simvastatin (ZOCOR) 20 MG tablet Take 1 tablet (20 mg total) by mouth daily. 90 tablet 3  . Testosterone (FORTESTA) 10 MG/ACT (2%) GEL Place 40 mg onto the skin daily.     Marland Kitchen topiramate (TOPAMAX) 25 MG tablet Take 12.5 mg by mouth daily.   12   No facility-administered medications prior to visit.    PAST MEDICAL HISTORY: Past Medical History:  Diagnosis Date  . Allergy   . Cancer ([place])    skin cancer to upper chest/basal cell 2007  . Chronic headaches   . Chronic neck pain    DJD  . Chronic pain in right foot   . DDD (degenerative disc disease), lumbar   . Depression   . Dyslipidemia   . Erectile dysfunction   . Exogenous  obesity   . GERD (gastroesophageal reflux disease)    no meds  . H/O acquired cardiomyopathy 1996   RESOLVED (apparently was thought to have had an MI, but did not have any intervention).  He developed cardiomyopathy that forced him to retire from [employer]..  The current EF 60-65%.  . History of fracture due to fall 1988   Bilateral wrist fractures  . Hyperlipidemia   . Hypertension   . Hypogonadism male   . IBS (irritable bowel syndrome)   . Obstructive sleep apnea   . Sleep apnea    CPAP nightly    PAST SURGICAL HISTORY: Past Surgical History:  Procedure Laterality Date  . CARDIAC CATHETERIZATION  2000   Nonobstructive disease.  Persistently reduced EF  . CARPAL TUNNEL RELEASE Left   . CARPAL TUNNEL RELEASE Right 01/05/2019   Procedure: RIGHT CARPAL TUNNEL RELEASE;  Surgeon: Leanora Cover, MD;  Location: [facility];  Service: Orthopedics;  Laterality: Right;  Bier block  . COLONOSCOPY    . CORONARY CT ANGIOGRAM  07/2017   Coronary calcium score 0?.  Focal noncalcific plaque (30%) in proximal LAD.  Otherwise no significant CAD.  Normal aortic root (3.2 cm)  . HAMMER TOE SURGERY Right   . ingrown toenail surgery Bilateral    2017  . IR RADIOLOGY PERIPHERAL GUIDED IV START  07/25/2017  . IR US GUIDE VASC ACCESS RIGHT  07/25/2017  . MENISCUS REPAIR    . ORIF WRIST FRACTURE Bilateral   . PLANTAR FASCIA RELEASE Right   . SHOULDER ARTHROSCOPY Left   . TENDON REPAIR Right    right quad tendon repair  . Tilt Table Test     Noted to have vasovagal syncope  . TRANSTHORACIC ECHOCARDIOGRAM  06/21/2015   Normal LV function.  EF 57%.  Mild left atrial enlargement, trace AI, trace MR, mild PI.  Marland Kitchen TRANSTHORACIC ECHOCARDIOGRAM  06/2017   EF 60-65%.  Mild diastolic dysfunction.  No significant valvular abnormality.  Mild aortic calcification.    FAMILY HISTORY: Family History  Problem Relation Age of Onset  . Cancer Mother        duodenal CA  . Heart disease Mother     . Depression Mother   . Stomach cancer Mother   . Cancer Father   . Hypertension Father   . High Cholesterol Father   . Heart disease Father   . Diabetes Father   . Hypertension Sister   . High Cholesterol Sister   .  Heart disease Sister   . Esophageal cancer Neg Hx   . Rectal cancer Neg Hx   . Colon cancer Neg Hx     SOCIAL HISTORY: Social History   Socioeconomic History  . Marital status: Married    Spouse name: Not on file  . Number of children: Not on file  . Years of education: Not on file  . Highest education level: Not on file  Occupational History  . Not on file  Tobacco Use  . Smoking status: Former Smoker    Quit date: 1996    Years since quitting: 25.9  . Smokeless tobacco: Never Used  Vaping Use  . Vaping Use: Never used  Substance and Sexual Activity  . Alcohol use: Yes    Comment: social  . Drug use: No  . Sexual activity: Not on file  Other Topics Concern  . Not on file  Social History Narrative   He has been married for 36 years.     Son (born 42) -lives in [place]   Daughter (born 23) also lives in [place].   Retired from [employer].  Enjoys doing house remodeling.   Remote history of smoking, stopped in 1996.   Minimal alcohol use.   Social Determinants of Health   Financial Resource Strain:   . Difficulty of Paying Living Expenses: Not on file  Food Insecurity:   . Worried About Charity fundraiser in the Last Year: Not on file  . Ran Out of Food in the Last Year: Not on file  Transportation Needs:   . Lack of Transportation (Medical): Not on file  . Lack of Transportation (Non-Medical): Not on file  Physical Activity:   . Days of Exercise per Week: Not on file  . Minutes of Exercise per Session: Not on file  Stress:   . Feeling of Stress : Not on file  Social Connections:   . Frequency of Communication with Friends and Family: Not on file  . Frequency of Social Gatherings with Friends and Family: Not on file  . Attends  Religious Services: Not on file  . Active Member of Clubs or Organizations: Not on file  . Attends Archivist Meetings: Not on file  . Marital Status: Not on file  Intimate Partner Violence:   . Fear of Current or Ex-Partner: Not on file  . Emotionally Abused: Not on file  . Physically Abused: Not on file  . Sexually Abused: Not on file      PHYSICAL EXAM  Vitals:   07/17/20 1050  BP: 118/80  Pulse: 76  Weight: 238 lb 9.6 oz (108.2 kg)  Height: 6' (1.829 m)   Body mass index is 32.36 kg/m.  Generalized: Well developed, in no acute distress  Chest: Lungs clear to auscultation bilaterally  Neurological examination  Mentation: Alert oriented to time, place, history taking. Follows all commands speech and language fluent Cranial nerve II-XII: Extraocular movements were full, visual field were full on confrontational test Head turning and shoulder shrug  were normal and symmetric. Motor: The motor testing reveals 5 over 5 strength of all 4 extremities. Good symmetric motor tone is noted throughout.  Sensory: Sensory testing is intact to soft touch on all 4 extremities. No evidence of extinction is noted.  Gait and station: Gait is normal.    DIAGNOSTIC DATA (LABS, IMAGING, TESTING) - I reviewed patient records, labs, notes, testing and imaging myself where available.  No results found for: WBC, HGB, HCT, MCV,  PLT    Component Value Date/Time   NA 140 01/01/2019 1224   NA 145 (H) 06/26/2017 1049   K 4.2 01/01/2019 1224   CL 107 01/01/2019 1224   CO2 25 01/01/2019 1224   GLUCOSE 105 (H) 01/01/2019 1224   BUN 17 01/01/2019 1224   BUN 16 06/26/2017 1049   CREATININE 0.89 01/01/2019 1224   CALCIUM 9.1 01/01/2019 1224   GFRNONAA >60 01/01/2019 1224   GFRAA >60 01/01/2019 1224     ASSESSMENT AND PLAN 69 y.o. year old male  has a past medical history of Allergy, Cancer ([place]), Chronic headaches, Chronic neck pain, Chronic pain in right foot, DDD (degenerative  disc disease), lumbar, Depression, Dyslipidemia, Erectile dysfunction, Exogenous obesity, GERD (gastroesophageal reflux disease), H/O acquired cardiomyopathy (1996), History of fracture due to fall (1988), Hyperlipidemia, Hypertension, Hypogonadism male, IBS (irritable bowel syndrome), Obstructive sleep apnea, and Sleep apnea. here with:  1. OSA on CPAP  - CPAP compliance excellent - Good treatment of AHI  - Encourage patient to use CPAP nightly and > 4 hours each night  2.  Narcolepsy  -Continue modafinil 200 mg daily  Advised if symptoms worsen or he develops new symptoms he should let us know. F/U in 1 year or sooner if needed   I spent 20 minutes of face-to-face and non-face-to-face time with patient.  This included previsit chart review, lab review, study review, order entry, electronic health record documentation, patient education.  Ward Givens, MSN, NP-C 07/16/2020, 2:45 PM [facility] [address], [place], [place] [address] [phone]

## 2020-07-17 ENCOUNTER — Ambulatory Visit (INDEPENDENT_AMBULATORY_CARE_PROVIDER_SITE_OTHER): Payer: Medicare Other | Admitting: Adult Health

## 2020-07-17 ENCOUNTER — Encounter: Payer: Self-pay | Admitting: Adult Health

## 2020-07-17 VITALS — BP 118/80 | HR 76 | Ht 72.0 in | Wt 238.6 lb

## 2020-07-17 DIAGNOSIS — G4733 Obstructive sleep apnea (adult) (pediatric): Secondary | ICD-10-CM | POA: Diagnosis not present

## 2020-07-17 DIAGNOSIS — G47411 Narcolepsy with cataplexy: Secondary | ICD-10-CM | POA: Diagnosis not present

## 2020-07-17 DIAGNOSIS — Z9989 Dependence on other enabling machines and devices: Secondary | ICD-10-CM | POA: Diagnosis not present

## 2020-07-17 NOTE — Patient Instructions (Signed)
Continue using CPAP nightly and greater than 4 hours each night Continue modafinil If your symptoms worsen or you develop new symptoms please let us know.

## 2020-08-28 DIAGNOSIS — Z23 Encounter for immunization: Secondary | ICD-10-CM | POA: Diagnosis not present

## 2020-09-27 DIAGNOSIS — Z125 Encounter for screening for malignant neoplasm of prostate: Secondary | ICD-10-CM | POA: Diagnosis not present

## 2020-09-27 DIAGNOSIS — R739 Hyperglycemia, unspecified: Secondary | ICD-10-CM | POA: Diagnosis not present

## 2020-09-27 DIAGNOSIS — E785 Hyperlipidemia, unspecified: Secondary | ICD-10-CM | POA: Diagnosis not present

## 2020-09-27 DIAGNOSIS — E291 Testicular hypofunction: Secondary | ICD-10-CM | POA: Diagnosis not present

## 2020-10-05 DIAGNOSIS — Z0000 Encounter for general adult medical examination without abnormal findings: Secondary | ICD-10-CM | POA: Diagnosis not present

## 2020-10-05 DIAGNOSIS — I255 Ischemic cardiomyopathy: Secondary | ICD-10-CM | POA: Diagnosis not present

## 2020-10-05 DIAGNOSIS — M25559 Pain in unspecified hip: Secondary | ICD-10-CM | POA: Diagnosis not present

## 2020-10-05 DIAGNOSIS — I10 Essential (primary) hypertension: Secondary | ICD-10-CM | POA: Diagnosis not present

## 2020-10-05 DIAGNOSIS — D126 Benign neoplasm of colon, unspecified: Secondary | ICD-10-CM | POA: Diagnosis not present

## 2020-10-05 DIAGNOSIS — E291 Testicular hypofunction: Secondary | ICD-10-CM | POA: Diagnosis not present

## 2020-10-05 DIAGNOSIS — E669 Obesity, unspecified: Secondary | ICD-10-CM | POA: Diagnosis not present

## 2020-10-05 DIAGNOSIS — R82998 Other abnormal findings in urine: Secondary | ICD-10-CM | POA: Diagnosis not present

## 2020-10-05 DIAGNOSIS — G4733 Obstructive sleep apnea (adult) (pediatric): Secondary | ICD-10-CM | POA: Diagnosis not present

## 2020-10-05 DIAGNOSIS — G8929 Other chronic pain: Secondary | ICD-10-CM | POA: Diagnosis not present

## 2020-10-05 DIAGNOSIS — M5441 Lumbago with sciatica, right side: Secondary | ICD-10-CM | POA: Diagnosis not present

## 2020-10-05 DIAGNOSIS — R42 Dizziness and giddiness: Secondary | ICD-10-CM | POA: Diagnosis not present

## 2020-10-05 DIAGNOSIS — M5442 Lumbago with sciatica, left side: Secondary | ICD-10-CM | POA: Diagnosis not present

## 2020-10-05 DIAGNOSIS — Z1212 Encounter for screening for malignant neoplasm of rectum: Secondary | ICD-10-CM | POA: Diagnosis not present

## 2020-10-09 ENCOUNTER — Other Ambulatory Visit: Payer: Self-pay | Admitting: Internal Medicine

## 2020-10-09 DIAGNOSIS — M5442 Lumbago with sciatica, left side: Secondary | ICD-10-CM

## 2020-10-16 ENCOUNTER — Encounter: Payer: Self-pay | Admitting: Orthopaedic Surgery

## 2020-10-16 ENCOUNTER — Ambulatory Visit (INDEPENDENT_AMBULATORY_CARE_PROVIDER_SITE_OTHER): Payer: Medicare Other

## 2020-10-16 ENCOUNTER — Ambulatory Visit (INDEPENDENT_AMBULATORY_CARE_PROVIDER_SITE_OTHER): Payer: Medicare Other | Admitting: Orthopaedic Surgery

## 2020-10-16 VITALS — Ht 72.0 in | Wt 254.0 lb

## 2020-10-16 DIAGNOSIS — M1611 Unilateral primary osteoarthritis, right hip: Secondary | ICD-10-CM | POA: Diagnosis not present

## 2020-10-16 DIAGNOSIS — M25552 Pain in left hip: Secondary | ICD-10-CM | POA: Diagnosis not present

## 2020-10-16 DIAGNOSIS — M1612 Unilateral primary osteoarthritis, left hip: Secondary | ICD-10-CM | POA: Insufficient documentation

## 2020-10-16 NOTE — Progress Notes (Signed)
Office Visit Note   Patient: Alexander [name]           Date of Birth: 13-Mar-1951           MRN: [id number] Visit Date: 10/16/2020              Requested by: Leanna Battles, MD [place],  [place] [address] PCP: Leanna Battles, MD   Assessment & Plan: Visit Diagnoses:  1. Pain in left hip   2. Unilateral primary osteoarthritis, left hip   3. Unilateral primary osteoarthritis, right hip     Plan: We did talk in length in detail about hip replacement surgery for his left hip.  I do feel that once he has both hips replaced this is going to help his spine and posture as well.  I showed him a hip model explained in detail what the surgery involves.  We went over his x-rays.  We described the risk and benefits of surgery and what to expect from an intraoperative and postoperative course.  He is interested in having this scheduled for a left total hip arthroplasty later in April and I agree with this.  All questions and concerns were answered and addressed.  We will be in touch about scheduling surgery.  Follow-Up Instructions: Return for 2 weeks post-op.   Orders:  Orders Placed This Encounter  Procedures  . XR HIP UNILAT W OR W/O PELVIS 1V LEFT   No orders of the defined types were placed in this encounter.     Procedures: No procedures performed   Clinical Data: No additional findings.   Subjective: Chief Complaint  Patient presents with  . Left Hip - Pain  The patient is a very pleasant 70 year old gentleman referred from Dr. Valetta Fuller of [facility] to evaluate and treat left hip osteoarthritis.  He has been hurting for more than a year now with his left hip and now he has some right hip pain in the groin.  He denies any specific injury.  He has a lot of stiffness when he first gets up from sitting position and gets to walk.  The pain is mainly in the groin area of both hips with the left worse than the right.  He has tried and failed  all forms of conservative treatment for over a year now.  This includes anti-inflammatories as well as hip strengthening exercises and activity modification as well as weight loss.  He is a diabetic but under good control.  His BMI is 34.45.  HPI  Review of Systems There is currently listed no headache, chest pain, shortness of breath, fever, chills, nausea, vomiting  Objective: Vital Signs: Ht 6' (1.829 m)   Wt 254 lb (115.2 kg)   BMI 34.45 kg/m   Physical Exam He is alert and orient x3 and in no acute distress Ortho Exam Examination of his left hip shows significant stiffness with rotation and severe pain in the groin with internal and external rotation.  The right hip also has stiffness but is a little bit less than the left side.  There is still pain on the right side with rotation as well.  His leg lengths are equal. Specialty Comments:  No specialty comments available.  Imaging: XR HIP UNILAT W OR W/O PELVIS 1V LEFT  Result Date: 10/16/2020 An AP pelvis and lateral of the left hip shows severe arthritis of the left hip and moderate to severe arthritis of the right hip.  There is narrowing  of the superior lateral aspect of both hips.  The left hip does have particular osteophytes and flattening of the lateral femoral head to suggest femoral acetabular impingement.  There is also cystic changes in the acetabulum.    PMFS History: Patient Active Problem List   Diagnosis Date Noted  . Unilateral primary osteoarthritis, left hip 10/16/2020  . Unilateral primary osteoarthritis, right hip 10/16/2020  . Hyperlipidemia due to dietary fat intake 10/01/2019  . Morbid obesity ([place]) 10/01/2019  . Excessive daytime sleepiness 03/03/2018  . Obstructive sleep apnea treated with bilevel positive airway pressure (BiPAP) 03/03/2018  . Sleep paralysis, recurrent isolated 03/03/2018  . Cataplexy 03/03/2018  . Abnormal dreams 03/03/2018  . Breathholding 03/03/2018  . Hypersomnia, persistent  09/03/2017  . Exertional dyspnea 06/20/2017  . Vasovagal syncope 06/20/2017  . Essential hypertension 06/20/2017  . History of cardiomyopathy 06/20/2017   Past Medical History:  Diagnosis Date  . Allergy   . Cancer ([place])    skin cancer to upper chest/basal cell 2007  . Chronic headaches   . Chronic neck pain    DJD  . Chronic pain in right foot   . DDD (degenerative disc disease), lumbar   . Depression   . Dyslipidemia   . Erectile dysfunction   . Exogenous obesity   . GERD (gastroesophageal reflux disease)    no meds  . H/O acquired cardiomyopathy 1996   RESOLVED (apparently was thought to have had an MI, but did not have any intervention).  He developed cardiomyopathy that forced him to retire from [employer]..  The current EF 60-65%.  . History of fracture due to fall 1988   Bilateral wrist fractures  . Hyperlipidemia   . Hypertension   . Hypogonadism male   . IBS (irritable bowel syndrome)   . Obstructive sleep apnea   . Sleep apnea    CPAP nightly    Family History  Problem Relation Age of Onset  . Cancer Mother        duodenal CA  . Heart disease Mother   . Depression Mother   . Stomach cancer Mother   . Cancer Father   . Hypertension Father   . High Cholesterol Father   . Heart disease Father   . Diabetes Father   . Hypertension Sister   . High Cholesterol Sister   . Heart disease Sister   . Esophageal cancer Neg Hx   . Rectal cancer Neg Hx   . Colon cancer Neg Hx     Past Surgical History:  Procedure Laterality Date  . CARDIAC CATHETERIZATION  2000   Nonobstructive disease.  Persistently reduced EF  . CARPAL TUNNEL RELEASE Left   . CARPAL TUNNEL RELEASE Right 01/05/2019   Procedure: RIGHT CARPAL TUNNEL RELEASE;  Surgeon: Leanora Cover, MD;  Location: [facility];  Service: Orthopedics;  Laterality: Right;  Bier block  . COLONOSCOPY    . CORONARY CT ANGIOGRAM  07/2017   Coronary calcium score 0?.  Focal noncalcific plaque (30%) in  proximal LAD.  Otherwise no significant CAD.  Normal aortic root (3.2 cm)  . HAMMER TOE SURGERY Right   . ingrown toenail surgery Bilateral    2017  . IR RADIOLOGY PERIPHERAL GUIDED IV START  07/25/2017  . IR US GUIDE VASC ACCESS RIGHT  07/25/2017  . MENISCUS REPAIR    . ORIF WRIST FRACTURE Bilateral   . PLANTAR FASCIA RELEASE Right   . SHOULDER ARTHROSCOPY Left   . TENDON REPAIR Right  right quad tendon repair  . Tilt Table Test     Noted to have vasovagal syncope  . TRANSTHORACIC ECHOCARDIOGRAM  06/21/2015   Normal LV function.  EF 57%.  Mild left atrial enlargement, trace AI, trace MR, mild PI.  Marland Kitchen TRANSTHORACIC ECHOCARDIOGRAM  06/2017   EF 60-65%.  Mild diastolic dysfunction.  No significant valvular abnormality.  Mild aortic calcification.   Social History   Occupational History  . Not on file  Tobacco Use  . Smoking status: Former Smoker    Quit date: 1996    Years since quitting: 26.1  . Smokeless tobacco: Never Used  Vaping Use  . Vaping Use: Never used  Substance and Sexual Activity  . Alcohol use: Yes    Comment: social  . Drug use: No  . Sexual activity: Not on file

## 2020-10-27 ENCOUNTER — Other Ambulatory Visit: Payer: Self-pay

## 2020-10-27 ENCOUNTER — Ambulatory Visit
Admission: RE | Admit: 2020-10-27 | Discharge: 2020-10-27 | Disposition: A | Discharge status: Home / Self Care | Payer: Medicare Other | Source: Ambulatory Visit | Attending: Internal Medicine | Admitting: Internal Medicine

## 2020-10-27 DIAGNOSIS — M545 Low back pain, unspecified: Secondary | ICD-10-CM | POA: Diagnosis not present

## 2020-10-27 DIAGNOSIS — M5442 Lumbago with sciatica, left side: Secondary | ICD-10-CM

## 2020-10-27 IMAGING — MR MR LUMBAR SPINE W/O CM
4 of 5 series · 17 of 48 positions shown · non-contrast
Comparison: MRI of the lumbar spine [DATE].

CLINICAL DATA: Left-sided low back pain with left-sided sciatica.

EXAM:
MRI LUMBAR SPINE WITHOUT CONTRAST
TECHNIQUE: Multiplanar, multisequence MR imaging of the lumbar spine was
performed. No intravenous contrast was administered.

[Series 5: T2 · sagittal · 4.0mm · 0.73mm/px · 5 of 15 slices shown (1 of 2)]
[im 1/15]
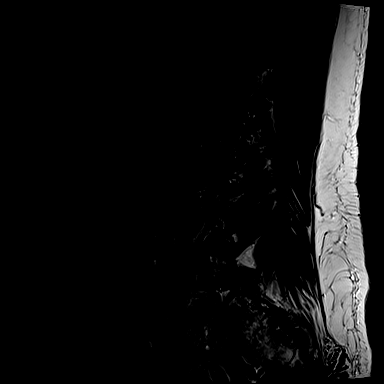
[im 4/15]
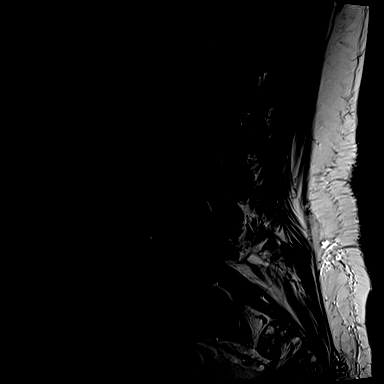
[im 8/15]
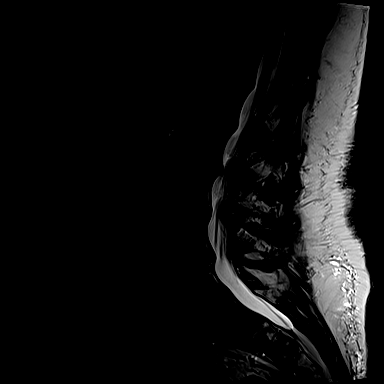
[im 11/15]
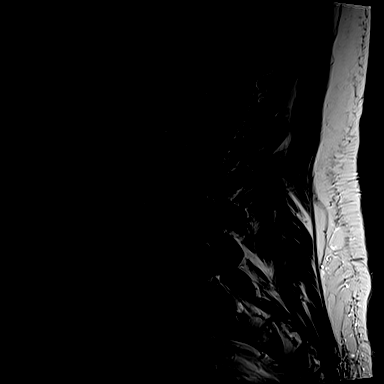
[im 15/15]
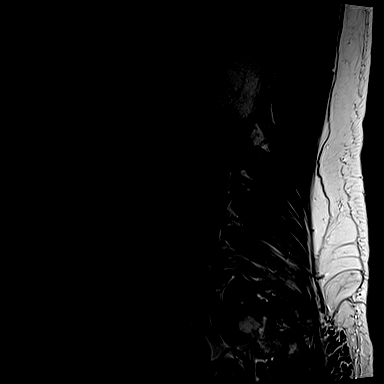

[Series 6: T1 · sagittal · 4.0mm · 0.73mm/px · 3 of 15 slices shown (1 of 2)]
[im 1/15]
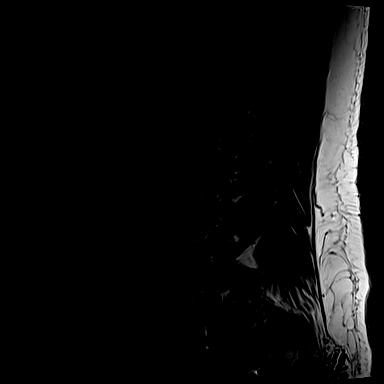
[im 8/15]
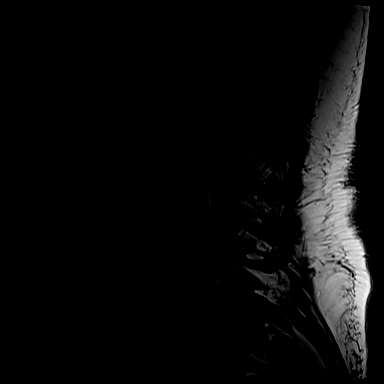
[im 15/15]
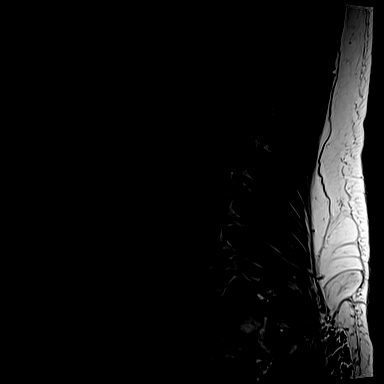

[Series 10: T1 · axial · 4.0mm · 0.28mm/px · z∈[-89,+92]mm · 3 of 42 slices shown (2 of 2)]
[im 6/42]
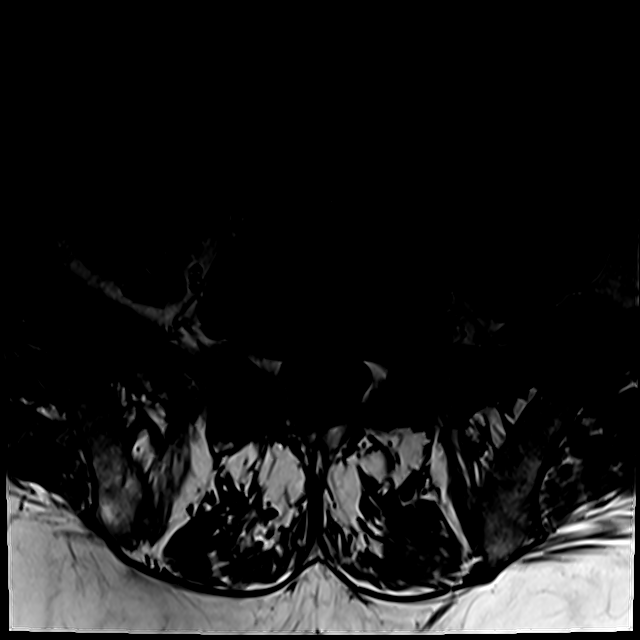
[im 22/42]
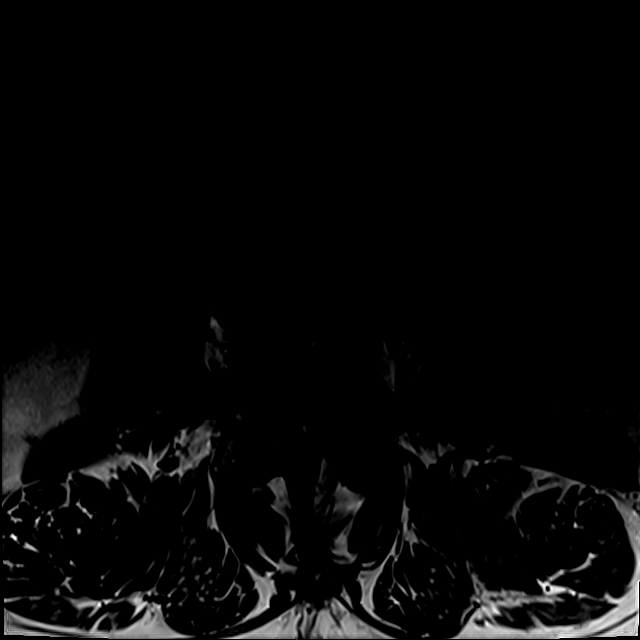
[im 36/42]
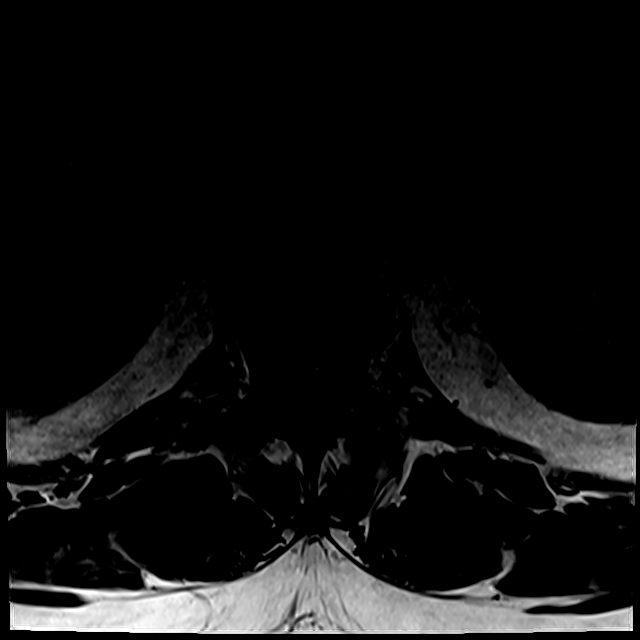

[Series 13: T2 · axial · 4.0mm · 0.28mm/px · z∈[-104,+92]mm · 6 of 42 slices shown (2 of 2)]
[im 3/42]
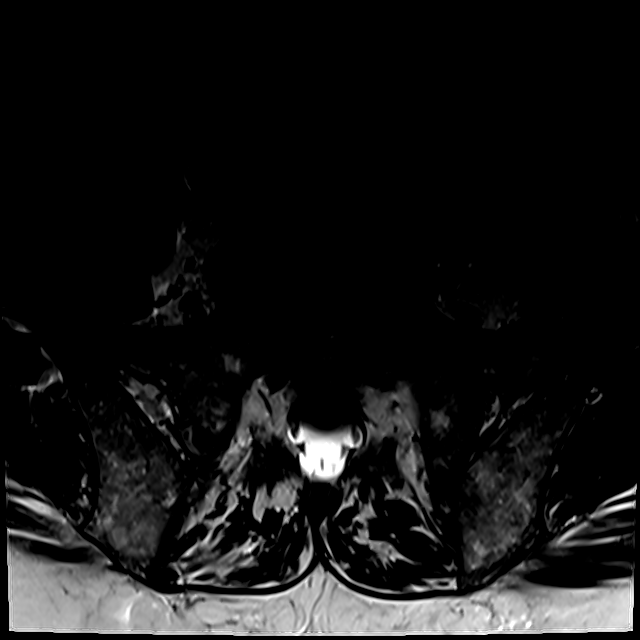
[im 6/42]
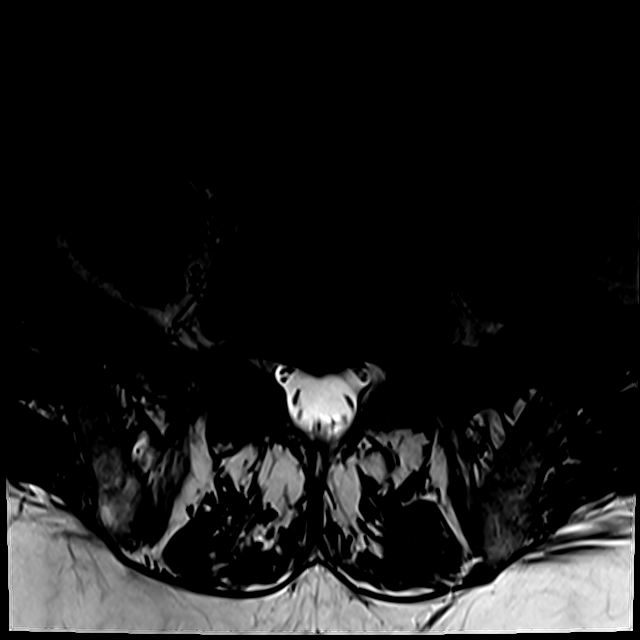
[im 9/42]
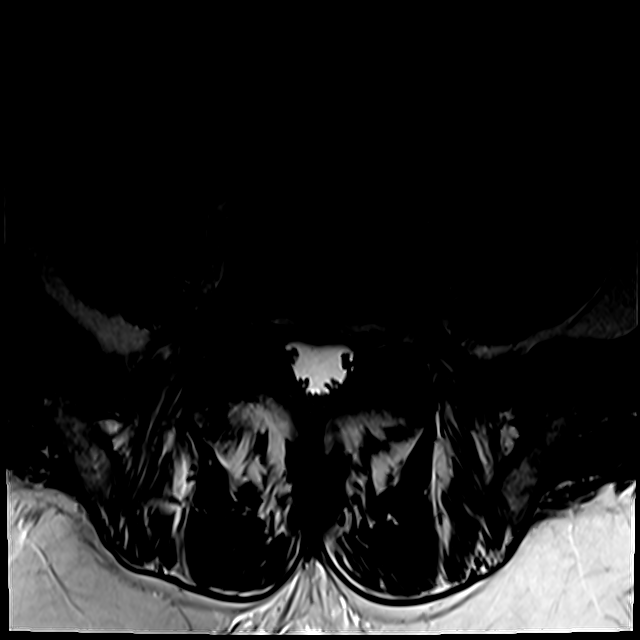
[im 14/42]
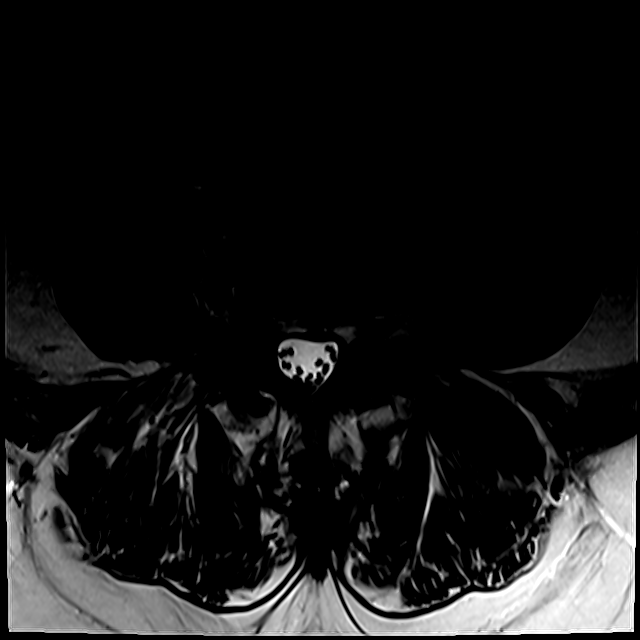
[im 22/42]
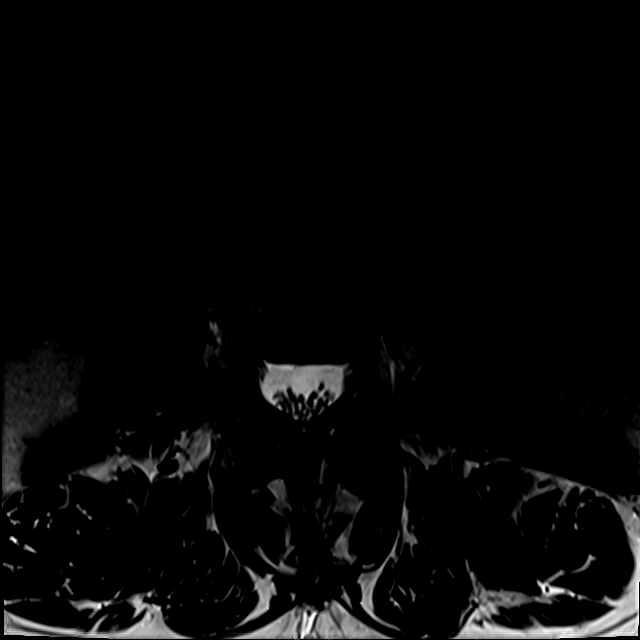
[im 36/42]
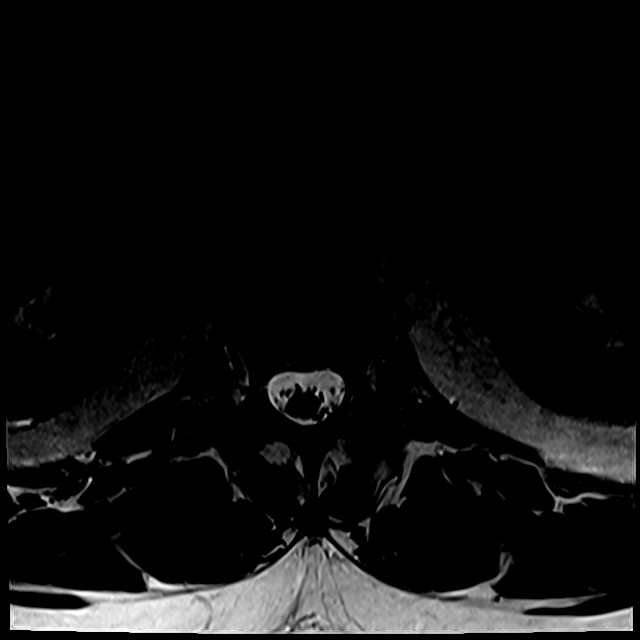

[17 of 48 positions shown; findings below may reference images not displayed]

FINDINGS: Segmentation:  Standard.

Alignment:  Minimal retrolisthesis of L4 over L5, unchanged.

Vertebrae:  No fracture, evidence of discitis, or bone lesion.

Conus medullaris and cauda equina: Conus extends to the L1-2 level.
Conus and cauda equina appear normal.

Paraspinal and other soft tissues: Bilateral renal cyst.

Disc levels:

T12-L1: Shallow disc bulge and mild facet degenerative changes
without significant spinal canal or neural foraminal stenosis.

L1-2: Shallow disc bulge and mild facet degenerative changes without
significant spinal canal or neural foraminal stenosis.

L2-3: Shallow disc bulge with superimposed new small left foraminal
disc protrusion and mild facet degenerative changes resulting in
mild left neural foraminal narrowing. No significant spinal canal
stenosis.

L3-4: Disc bulge and mild facet degenerative changes resulting in
mild bilateral neural foraminal narrowing.

L4-5: Postsurgical changes from right laminectomy. Loss of disc
height, disc bulge with osteophytic component, moderate right and
mild left facet degenerative changes resulting in moderate right and
mild-to-moderate left neural foraminal narrowing, not significantly
changed from prior MRI

L5-S1: Shallow disc bulge, prominent hypertrophic facet degenerative
changes on the right and moderate on the left without significant
spinal canal or neural foraminal stenosis. The right facet
osteoarthritis appear progressed since prior MRI.
IMPRESSION: 1. Multilevel degenerative changes of the lumbar spine as described.
2. Postsurgical changes from right laminectomy at L4-L5. Moderate
right and mild-to-moderate left neural foraminal narrowing at this
level is not significantly changed from prior MRI.
3. Progressive right facet osteoarthritis at L5-S1.
4. Mild left neural foraminal narrowing at L2-L3.

## 2020-10-28 ENCOUNTER — Other Ambulatory Visit: Payer: Self-pay | Admitting: Cardiology

## 2020-11-13 ENCOUNTER — Telehealth: Payer: Self-pay

## 2020-11-13 ENCOUNTER — Other Ambulatory Visit: Payer: Self-pay | Admitting: Cardiology

## 2020-11-13 NOTE — Telephone Encounter (Signed)
Patient called regarding taking a covid test 3 days prior to surgery , he stated he will be out of town on the 4/19 and will be back in town 4/20 and has planned to take the test before 3:00 would he be okay,and he wants clarification on the info provided he is also asking for the address to testing site. call back:[phone]

## 2020-11-14 NOTE — Telephone Encounter (Signed)
I called patient.  Okay for COVID test on 12/06/20.

## 2020-11-22 NOTE — Progress Notes (Signed)
Need orders in epic.  Preop on 11/28/20.

## 2020-11-23 ENCOUNTER — Other Ambulatory Visit: Payer: Self-pay | Admitting: Physician Assistant

## 2020-11-27 NOTE — Progress Notes (Signed)
DUE TO COVID-19 ONLY ONE VISITOR IS ALLOWED TO COME WITH YOU AND STAY IN THE WAITING ROOM ONLY DURING PRE OP AND PROCEDURE DAY OF SURGERY. THE 1 VISITOR  MAY VISIT WITH YOU AFTER SURGERY IN YOUR PRIVATE ROOM DURING VISITING HOURS ONLY!  YOU NEED TO HAVE A COVID 19 TEST ON__4/19/2022 _____ @_______ , THIS TEST MUST BE DONE BEFORE SURGERY,  COVID TESTING SITE [address] [place] [address], IT IS ON THE RIGHT GOING OUT [address] APPROXIMATELY  2 MINUTES PAST [employer] ON THE RIGHT. ONCE YOUR COVID TEST IS COMPLETED,  PLEASE BEGIN THE QUARANTINE INSTRUCTIONS AS OUTLINED IN YOUR HANDOUT.                Alexander [name]  11/27/2020   Your procedure is scheduled on:  12/08/2020   Report to [facility] Main  Entrance   Report to admitting at    0600AM     Call this number if you have problems the morning of surgery [phone]    REMEMBER: NO  SOLID FOOD CANDY OR GUM AFTER MIDNIGHT. CLEAR LIQUIDS UNTIL   0530am       . NOTHING BY MOUTH EXCEPT CLEAR LIQUIDS UNTIL   0530am    . PLEASE FINISH ENSURE DRINK PER SURGEON ORDER  WHICH NEEDS TO BE COMPLETED AT   0530am    .      CLEAR LIQUID DIET   Foods Allowed                                                                    Coffee and tea, regular and decaf                            Fruit ices (not with fruit pulp)                                      Iced Popsicles                                    Carbonated beverages, regular and diet                                    Cranberry, grape and apple juices Sports drinks like Gatorade Lightly seasoned clear broth or consume(fat free) Sugar, honey syrup ___________________________________________________________________      BRUSH YOUR TEETH MORNING OF SURGERY AND RINSE YOUR MOUTH OUT, NO CHEWING GUM CANDY OR MINTS.     Take these medicines the morning of surgery with A SIP OF WATER:  coreg DO NOT TAKE ANY DIABETIC MEDICATIONS DAY OF YOUR SURGERY                                You may not have any metal on your body including hair pins and              piercings  Do  not wear jewelry, make-up, lotions, powders or perfumes, deodorant             Do not wear nail polish on your fingernails.  Do not shave  48 hours prior to surgery.              Men may shave face and neck.   Do not bring valuables to the hospital. [place].  Contacts, dentures or bridgework may not be worn into surgery.  Leave suitcase in the car. After surgery it may be brought to your room.     Patients discharged the day of surgery will not be allowed to drive home. IF YOU ARE HAVING SURGERY AND GOING HOME THE SAME DAY, YOU MUST HAVE AN ADULT TO DRIVE YOU HOME AND BE WITH YOU FOR 24 HOURS. YOU MAY GO HOME BY TAXI OR UBER OR ORTHERWISE, BUT AN ADULT MUST ACCOMPANY YOU HOME AND STAY WITH YOU FOR 24 HOURS.  Name and phone number of your driver:  Special Instructions: N/A              Please read over the following fact sheets you were given: _____________________________________________________________________  [facility] - Preparing for Surgery Before surgery, you can play an important role.  Because skin is not sterile, your skin needs to be as free of germs as possible.  You can reduce the number of germs on your skin by washing with CHG (chlorahexidine gluconate) soap before surgery.  CHG is an antiseptic cleaner which kills germs and bonds with the skin to continue killing germs even after washing. Please DO NOT use if you have an allergy to CHG or antibacterial soaps.  If your skin becomes reddened/irritated stop using the CHG and inform your nurse when you arrive at Short Stay. Do not shave (including legs and underarms) for at least 48 hours prior to the first CHG shower.  You may shave your face/neck. Please follow these instructions carefully:  1.  Shower with CHG Soap the night before surgery and the  morning of  Surgery.  2.  If you choose to wash your hair, wash your hair first as usual with your  normal  shampoo.  3.  After you shampoo, rinse your hair and body thoroughly to remove the  shampoo.                           4.  Use CHG as you would any other liquid soap.  You can apply chg directly  to the skin and wash                       Gently with a scrungie or clean washcloth.  5.  Apply the CHG Soap to your body ONLY FROM THE NECK DOWN.   Do not use on face/ open                           Wound or open sores. Avoid contact with eyes, ears mouth and genitals (private parts).                       Wash face,  Genitals (private parts) with your normal soap.             6.  Wash thoroughly,  paying special attention to the area where your surgery  will be performed.  7.  Thoroughly rinse your body with warm water from the neck down.  8.  DO NOT shower/wash with your normal soap after using and rinsing off  the CHG Soap.                9.  Pat yourself dry with a clean towel.            10.  Wear clean pajamas.            11.  Place clean sheets on your bed the night of your first shower and do not  sleep with pets. Day of Surgery : Do not apply any lotions/deodorants the morning of surgery.  Please wear clean clothes to the hospital/surgery center.  FAILURE TO FOLLOW THESE INSTRUCTIONS MAY RESULT IN THE CANCELLATION OF YOUR SURGERY PATIENT SIGNATURE_________________________________  NURSE SIGNATURE__________________________________  ________________________________________________________________________

## 2020-11-28 ENCOUNTER — Other Ambulatory Visit: Payer: Self-pay

## 2020-11-28 ENCOUNTER — Encounter (HOSPITAL_COMMUNITY)
Admission: RE | Admit: 2020-11-28 | Discharge: 2020-11-28 | Disposition: A | Discharge status: Home / Self Care | Payer: Medicare Other | Source: Ambulatory Visit | Attending: Orthopaedic Surgery | Admitting: Orthopaedic Surgery

## 2020-11-28 ENCOUNTER — Encounter (HOSPITAL_COMMUNITY): Payer: Self-pay

## 2020-11-28 DIAGNOSIS — Z01812 Encounter for preprocedural laboratory examination: Secondary | ICD-10-CM | POA: Insufficient documentation

## 2020-11-28 HISTORY — DX: Prediabetes: R73.03

## 2020-11-28 LAB — BASIC METABOLIC PANEL
Anion gap: 8 (ref 5–15)
BUN: 18 mg/dL (ref 8–23)
CO2: 25 mmol/L (ref 22–32)
Calcium: 9.3 mg/dL (ref 8.9–10.3)
Chloride: 108 mmol/L (ref 98–111)
Creatinine, Ser: 0.96 mg/dL (ref 0.61–1.24)
GFR, Estimated: >60 mL/min (ref >60)
Glucose, Bld: 114 mg/dL — ABNORMAL HIGH (ref 70–99)
Potassium: 3.7 mmol/L (ref 3.5–5.1)
Sodium: 141 mmol/L (ref 135–145)

## 2020-11-28 LAB — CBC
HCT: 46.5 % (ref 39.0–52.0)
Hemoglobin: 15.7 g/dL (ref 13.0–17.0)
MCH: 32.3 pg (ref 26.0–34.0)
MCHC: 33.8 g/dL (ref 30.0–36.0)
MCV: 95.7 fL (ref 80.0–100.0)
Platelets: 219 10*3/uL (ref 150–400)
RBC: 4.86 MIL/uL (ref 4.22–5.81)
RDW: 13 % (ref 11.5–15.5)
WBC: 5.5 10*3/uL (ref 4.0–10.5)
nRBC: 0 % (ref 0.0–0.2)

## 2020-11-28 LAB — HEMOGLOBIN A1C
Hgb A1c MFr Bld: 5.9 % — ABNORMAL HIGH (ref 4.8–5.6)
Mean Plasma Glucose: 122.63 mg/dL

## 2020-11-28 LAB — SURGICAL PCR SCREEN
MRSA, PCR: NEGATIVE
Staphylococcus aureus: NEGATIVE

## 2020-11-28 NOTE — Progress Notes (Addendum)
Anesthesia Review:  PCP: DR Valetta Fuller LOv 05/30/20 Cardiologist : DR Glenetta Hew 03/27/2020  Neuro- Ward Givens- Lov- 07/17/20  Chest x-ray : EKG :03/27/2020  Echo :2018  Stress test: Cardiac Cath :  Activity level: can do ao flight of stairs without difficulty  Sleep Study/ CPAP : cpap  Fasting Blood Sugar :      / Checks Blood Sugar -- times a day:   Blood Thinner/ Instructions /Last Dose: ASA / Instructions/ Last Dose :  Prediabetes 81 mg Aspirin- pt to call office of Dr [name] and/or Dr Ellyn Hack for presurgery instructions  hgba1c-11/28/20- 5.9

## 2020-11-29 ENCOUNTER — Other Ambulatory Visit: Payer: Self-pay | Admitting: Adult Health

## 2020-11-30 ENCOUNTER — Other Ambulatory Visit: Payer: Self-pay

## 2020-11-30 NOTE — Telephone Encounter (Signed)
[place] controlled drug registry was checked, last filled 09/06/20. Pt was last seen 07/17/21, advised to FU in a year and has upcoming appt 07/17/21 with Megan. Refill is appropriate on my end, Please advise

## 2020-12-06 ENCOUNTER — Telehealth: Payer: Self-pay | Admitting: *Deleted

## 2020-12-06 ENCOUNTER — Other Ambulatory Visit (HOSPITAL_COMMUNITY)
Admission: RE | Admit: 2020-12-06 | Discharge: 2020-12-06 | Disposition: A | Discharge status: Home / Self Care | Payer: Medicare Other | Source: Ambulatory Visit | Attending: Orthopaedic Surgery | Admitting: Orthopaedic Surgery

## 2020-12-06 DIAGNOSIS — Z01812 Encounter for preprocedural laboratory examination: Secondary | ICD-10-CM | POA: Diagnosis not present

## 2020-12-06 DIAGNOSIS — Z20822 Contact with and (suspected) exposure to covid-19: Secondary | ICD-10-CM | POA: Insufficient documentation

## 2020-12-06 LAB — SARS CORONAVIRUS 2 (TAT 6-24 HRS): SARS Coronavirus 2: NEGATIVE

## 2020-12-06 NOTE — Care Plan (Signed)
RNCM call to patient to discuss his upcoming Left total hip replacement with Dr. [name] on Friday, 12/08/20. He is an Ortho bundle patient through [facility] and is agreeable to case management. Spoke with wife and reviewed all post op care instructions. Will reach out to patient to review [facility] tomorrow. Plan is to discharge home with spouse assisting. Patient states they have a FWW already (will be using family member's and is pretty sure they can use); declined 3in1 at this time, but will leave it up to therapy to determine if needed after surgery. Anticipate HHPT will be needed for a few visits after discharge. Choice provided and referral made to [facility] ([facility] Will continue to follow.

## 2020-12-06 NOTE — Telephone Encounter (Signed)
Ortho bundle pre-op call completed. 

## 2020-12-07 NOTE — H&P (Signed)
TOTAL HIP ADMISSION H&P  Patient is admitted for left total hip arthroplasty.  Subjective:  Chief Complaint: left hip pain  HPI: Alexander [name], 70 y.o. male, has a history of pain and functional disability in the left hip(s) due to arthritis and patient has failed non-surgical conservative treatments for greater than 12 weeks to include NSAID's and/or analgesics, flexibility and strengthening excercises, use of assistive devices, weight reduction as appropriate and activity modification.  Onset of symptoms was gradual starting 2 years ago with gradually worsening course since that time.The patient noted no past surgery on the left hip(s).  Patient currently rates pain in the left hip at 9 out of 10 with activity. Patient has night pain, worsening of pain with activity and weight bearing, trendelenberg gait, pain that interfers with activities of daily living and pain with passive range of motion. Patient has evidence of subchondral sclerosis, periarticular osteophytes and joint space narrowing by imaging studies. This condition presents safety issues increasing the risk of falls.  There is no current active infection.  Patient Active Problem List   Diagnosis Date Noted  . Unilateral primary osteoarthritis, left hip 10/16/2020  . Unilateral primary osteoarthritis, right hip 10/16/2020  . Hyperlipidemia due to dietary fat intake 10/01/2019  . Morbid obesity ([place]) 10/01/2019  . Excessive daytime sleepiness 03/03/2018  . Obstructive sleep apnea treated with bilevel positive airway pressure (BiPAP) 03/03/2018  . Sleep paralysis, recurrent isolated 03/03/2018  . Cataplexy 03/03/2018  . Abnormal dreams 03/03/2018  . Breathholding 03/03/2018  . Hypersomnia, persistent 09/03/2017  . Exertional dyspnea 06/20/2017  . Vasovagal syncope 06/20/2017  . Essential hypertension 06/20/2017  . History of cardiomyopathy 06/20/2017   Past Medical History:  Diagnosis Date  . Allergy   . Cancer ([place])     skin cancer to upper chest/basal cell 2007  . Chronic neck pain    DJD  . Chronic pain in right foot   . DDD (degenerative disc disease), lumbar   . Dyslipidemia   . Erectile dysfunction   . Exogenous obesity   . H/O acquired cardiomyopathy 1996   RESOLVED (apparently was thought to have had an MI, but did not have any intervention).  He developed cardiomyopathy that forced him to retire from [employer]..  The current EF 60-65%.  . History of fracture due to fall 1988   Bilateral wrist fractures  . Hyperlipidemia   . Hypertension   . Hypogonadism male   . IBS (irritable bowel syndrome)   . Obstructive sleep apnea   . Pre-diabetes   . Sleep apnea    CPAP nightly    Past Surgical History:  Procedure Laterality Date  . CARDIAC CATHETERIZATION  2000   Nonobstructive disease.  Persistently reduced EF  . CARPAL TUNNEL RELEASE Left   . CARPAL TUNNEL RELEASE Right 01/05/2019   Procedure: RIGHT CARPAL TUNNEL RELEASE;  Surgeon: Leanora Cover, MD;  Location: [place];  Service: Orthopedics;  Laterality: Right;  Bier block  . COLONOSCOPY    . CORONARY CT ANGIOGRAM  07/2017   Coronary calcium score 0?.  Focal noncalcific plaque (30%) in proximal LAD.  Otherwise no significant CAD.  Normal aortic root (3.2 cm)  . HAMMER TOE SURGERY Right   . ingrown toenail surgery Bilateral    2017  . IR RADIOLOGY PERIPHERAL GUIDED IV START  07/25/2017  . IR US GUIDE VASC ACCESS RIGHT  07/25/2017  . MENISCUS REPAIR    . ORIF WRIST FRACTURE Bilateral   . PLANTAR  FASCIA RELEASE Right   . SHOULDER ARTHROSCOPY Left   . TENDON REPAIR Right    right quad tendon repair  . Tilt Table Test     Noted to have vasovagal syncope  . TRANSTHORACIC ECHOCARDIOGRAM  06/21/2015   Normal LV function.  EF 57%.  Mild left atrial enlargement, trace AI, trace MR, mild PI.  Marland Kitchen TRANSTHORACIC ECHOCARDIOGRAM  06/2017   EF 60-65%.  Mild diastolic dysfunction.  No significant valvular abnormality.  Mild aortic  calcification.    No current facility-administered medications for this encounter.   Current Outpatient Medications  Medication Sig Dispense Refill Last Dose  . aspirin EC 81 MG tablet Take 1 tablet (81 mg total) by mouth daily. 90 tablet 3   . Calcium Carb-Cholecalciferol (CALCIUM 600/VITAMIN D PO) Take 1 tablet by mouth daily.     . carvedilol (COREG) 12.5 MG tablet Take 1 tablet (12.5 mg total) by mouth 2 (two) times daily. 180 tablet 3   . losartan-hydrochlorothiazide (HYZAAR) 100-25 MG tablet TAKE 1 TABLET DAILY (Patient taking differently: Take 1 tablet by mouth daily.) 90 tablet 2   . Magnesium 250 MG TABS Take 250 mg by mouth daily.     . metFORMIN (GLUCOPHAGE-XR) 500 MG 24 hr tablet Take 500 mg by mouth every evening.     . Multiple Vitamin (MULTIVITAMIN WITH MINERALS) TABS tablet Take 1 tablet by mouth daily.     . simvastatin (ZOCOR) 20 MG tablet TAKE 1 TABLET DAILY (Patient taking differently: Take 20 mg by mouth every evening.) 90 tablet 3   . Testosterone 10 MG/ACT (2%) GEL Place 3 Pump onto the skin daily. On each inner thigh     . topiramate (TOPAMAX) 25 MG tablet Take 12.5 mg by mouth at bedtime.  12   . vitamin B-12 (CYANOCOBALAMIN) 1000 MCG tablet Take 3,000 mcg by mouth daily.     . modafinil (PROVIGIL) 200 MG tablet TAKE 1 TABLET DAILY (HAS APPOINTMENT 07-17-20) 90 tablet 1   . sildenafil (REVATIO) 20 MG tablet Take 20 mg by mouth 3 (three) times daily. (Patient not taking: Reported on 11/22/2020)   Not Taking at Unknown time   No Known Allergies  Social History   Tobacco Use  . Smoking status: Former Smoker    Quit date: 1996    Years since quitting: 26.3  . Smokeless tobacco: Never Used  Substance Use Topics  . Alcohol use: Yes    Comment: social    Family History  Problem Relation Age of Onset  . Cancer Mother        duodenal CA  . Heart disease Mother   . Depression Mother   . Stomach cancer Mother   . Cancer Father   . Hypertension Father   . High  Cholesterol Father   . Heart disease Father   . Diabetes Father   . Hypertension Sister   . High Cholesterol Sister   . Heart disease Sister   . Esophageal cancer Neg Hx   . Rectal cancer Neg Hx   . Colon cancer Neg Hx      Review of Systems  All other systems reviewed and are negative.   Objective:  Physical Exam Vitals reviewed.  Constitutional:      Appearance: Normal appearance.  HENT:     Head: Normocephalic and atraumatic.  Eyes:     Extraocular Movements: Extraocular movements intact.     Pupils: Pupils are equal, round, and reactive to light.  Cardiovascular:  Rate and Rhythm: Normal rate.     Pulses: Normal pulses.  Pulmonary:     Effort: Pulmonary effort is normal.  Abdominal:     Palpations: Abdomen is soft.  Musculoskeletal:     Cervical back: Normal range of motion.     Left hip: Tenderness and bony tenderness present. Decreased range of motion. Decreased strength.  Neurological:     Mental Status: He is alert and oriented to person, place, and time.  Psychiatric:        Behavior: Behavior normal.     Vital signs in last 24 hours:    Labs:   Estimated body mass index is 34.45 kg/m as calculated from the following:   Height as of 10/16/20: 6' (1.829 m).   Weight as of 10/16/20: 115.2 kg.   Imaging Review Plain radiographs demonstrate severe degenerative joint disease of the left hip(s). The bone quality appears to be excellent for age and reported activity level.      Assessment/Plan:  End stage arthritis, left hip(s)  The patient history, physical examination, clinical judgement of the provider and imaging studies are consistent with end stage degenerative joint disease of the left hip(s) and total hip arthroplasty is deemed medically necessary. The treatment options including medical management, injection therapy, arthroscopy and arthroplasty were discussed at length. The risks and benefits of total hip arthroplasty were presented and  reviewed. The risks due to aseptic loosening, infection, stiffness, dislocation/subluxation,  thromboembolic complications and other imponderables were discussed.  The patient acknowledged the explanation, agreed to proceed with the plan and consent was signed. Patient is being admitted for inpatient treatment for surgery, pain control, PT, OT, prophylactic antibiotics, VTE prophylaxis, progressive ambulation and ADL's and discharge planning.The patient is planning to be discharged home with home health services

## 2020-12-07 NOTE — Anesthesia Preprocedure Evaluation (Addendum)
Anesthesia Evaluation  Patient identified by MRN, date of birth, ID band Patient awake    Reviewed: Allergy & Precautions, NPO status , Patient's Chart, lab work & pertinent test results, reviewed documented beta blocker date and time   History of Anesthesia Complications Negative for: history of anesthetic complications  Airway Mallampati: II  TM Distance: >3 FB Neck ROM: Full    Dental  (+) Dental Advisory Given, Partial Lower   Pulmonary sleep apnea and Continuous Positive Airway Pressure Ventilation , former smoker,    Pulmonary exam normal        Cardiovascular hypertension, Pt. on medications and Pt. on home beta blockers Normal cardiovascular exam   '18 TTE - EF 60% to 65%. Grade 1 diastolic dysfunction. Mildly dilated LA. No valvulopathy     Neuro/Psych negative neurological ROS  negative psych ROS   GI/Hepatic Neg liver ROS,  IBS    Endo/Other  diabetes, Type 2  Renal/GU negative Renal ROS     Musculoskeletal  (+) Arthritis ,   Abdominal   Peds  Hematology negative hematology ROS (+)  Plt 219k    Anesthesia Other Findings Covid test negative   Reproductive/Obstetrics                            Anesthesia Physical Anesthesia Plan  ASA: III  Anesthesia Plan: Spinal   Post-op Pain Management:    Induction:   PONV Risk Score and Plan: 2 and Treatment may vary due to age or medical condition and Propofol infusion  Airway Management Planned: Natural Airway and Simple Face Mask  Additional Equipment: None  Intra-op Plan:   Post-operative Plan:   Informed Consent: I have reviewed the patients History and Physical, chart, labs and discussed the procedure including the risks, benefits and alternatives for the proposed anesthesia with the patient or authorized representative who has indicated his/her understanding and acceptance.       Plan Discussed with: CRNA and  Anesthesiologist  Anesthesia Plan Comments: (Labs reviewed, platelets acceptable. Discussed risks and benefits of spinal, including spinal/epidural hematoma, infection, failed block, and PDPH. Patient expressed understanding and wished to proceed. )       Anesthesia Quick Evaluation

## 2020-12-08 ENCOUNTER — Other Ambulatory Visit: Payer: Self-pay

## 2020-12-08 ENCOUNTER — Ambulatory Visit (HOSPITAL_COMMUNITY): Payer: Medicare Other | Admitting: Certified Registered Nurse Anesthetist

## 2020-12-08 ENCOUNTER — Encounter (HOSPITAL_COMMUNITY)
Admission: RE | Disposition: A | Discharge status: Home / Self Care | Payer: Self-pay | Source: Home / Self Care | Attending: Orthopaedic Surgery

## 2020-12-08 ENCOUNTER — Observation Stay (HOSPITAL_COMMUNITY)
Admission: RE | Admit: 2020-12-08 | Discharge: 2020-12-09 | Disposition: A | Discharge status: Home / Self Care | Payer: Medicare Other | Attending: Orthopaedic Surgery | Admitting: Orthopaedic Surgery

## 2020-12-08 ENCOUNTER — Observation Stay (HOSPITAL_COMMUNITY): Payer: Medicare Other

## 2020-12-08 ENCOUNTER — Ambulatory Visit (HOSPITAL_COMMUNITY): Payer: Medicare Other

## 2020-12-08 ENCOUNTER — Ambulatory Visit (HOSPITAL_COMMUNITY): Payer: Medicare Other | Admitting: Physician Assistant

## 2020-12-08 ENCOUNTER — Encounter (HOSPITAL_COMMUNITY): Payer: Self-pay | Admitting: Orthopaedic Surgery

## 2020-12-08 DIAGNOSIS — I10 Essential (primary) hypertension: Secondary | ICD-10-CM | POA: Diagnosis not present

## 2020-12-08 DIAGNOSIS — Z85828 Personal history of other malignant neoplasm of skin: Secondary | ICD-10-CM | POA: Diagnosis not present

## 2020-12-08 DIAGNOSIS — R7303 Prediabetes: Secondary | ICD-10-CM | POA: Insufficient documentation

## 2020-12-08 DIAGNOSIS — M1612 Unilateral primary osteoarthritis, left hip: Principal | ICD-10-CM | POA: Diagnosis present

## 2020-12-08 DIAGNOSIS — Z471 Aftercare following joint replacement surgery: Secondary | ICD-10-CM | POA: Diagnosis not present

## 2020-12-08 DIAGNOSIS — Z87891 Personal history of nicotine dependence: Secondary | ICD-10-CM | POA: Insufficient documentation

## 2020-12-08 DIAGNOSIS — Z96642 Presence of left artificial hip joint: Secondary | ICD-10-CM | POA: Diagnosis not present

## 2020-12-08 DIAGNOSIS — Z7984 Long term (current) use of oral hypoglycemic drugs: Secondary | ICD-10-CM | POA: Diagnosis not present

## 2020-12-08 DIAGNOSIS — Z7982 Long term (current) use of aspirin: Secondary | ICD-10-CM | POA: Diagnosis not present

## 2020-12-08 DIAGNOSIS — G4733 Obstructive sleep apnea (adult) (pediatric): Secondary | ICD-10-CM | POA: Diagnosis not present

## 2020-12-08 DIAGNOSIS — Z9989 Dependence on other enabling machines and devices: Secondary | ICD-10-CM | POA: Insufficient documentation

## 2020-12-08 DIAGNOSIS — Z79899 Other long term (current) drug therapy: Secondary | ICD-10-CM | POA: Diagnosis not present

## 2020-12-08 DIAGNOSIS — Z419 Encounter for procedure for purposes other than remedying health state, unspecified: Secondary | ICD-10-CM

## 2020-12-08 HISTORY — PX: TOTAL HIP ARTHROPLASTY: SHX124

## 2020-12-08 LAB — GLUCOSE, CAPILLARY
Glucose-Capillary: 126 mg/dL — ABNORMAL HIGH (ref 70–99)
Glucose-Capillary: 114 mg/dL — ABNORMAL HIGH (ref 70–99)

## 2020-12-08 LAB — TYPE AND SCREEN
ABO/RH(D): O NEG
Antibody Screen: NEGATIVE

## 2020-12-08 LAB — ABO/RH: ABO/RH(D): O NEG

## 2020-12-08 IMAGING — RF DG HIP (WITH PELVIS) OPERATIVE*L*
1 series · 3 of 3 positions shown · non-contrast
Comparison: [DATE].

CLINICAL DATA: Left hip replacement.

EXAM:
OPERATIVE left HIP (WITH PELVIS IF PERFORMED) 3 VIEWS
TECHNIQUE: Fluoroscopic spot image(s) were submitted for interpretation
post-operatively.
Radiation exposure index: 3.3596 mGy.

[Series 1: unknown protocol · 0.20mm/px · 3 of 3 slices shown]
[im 1/3]
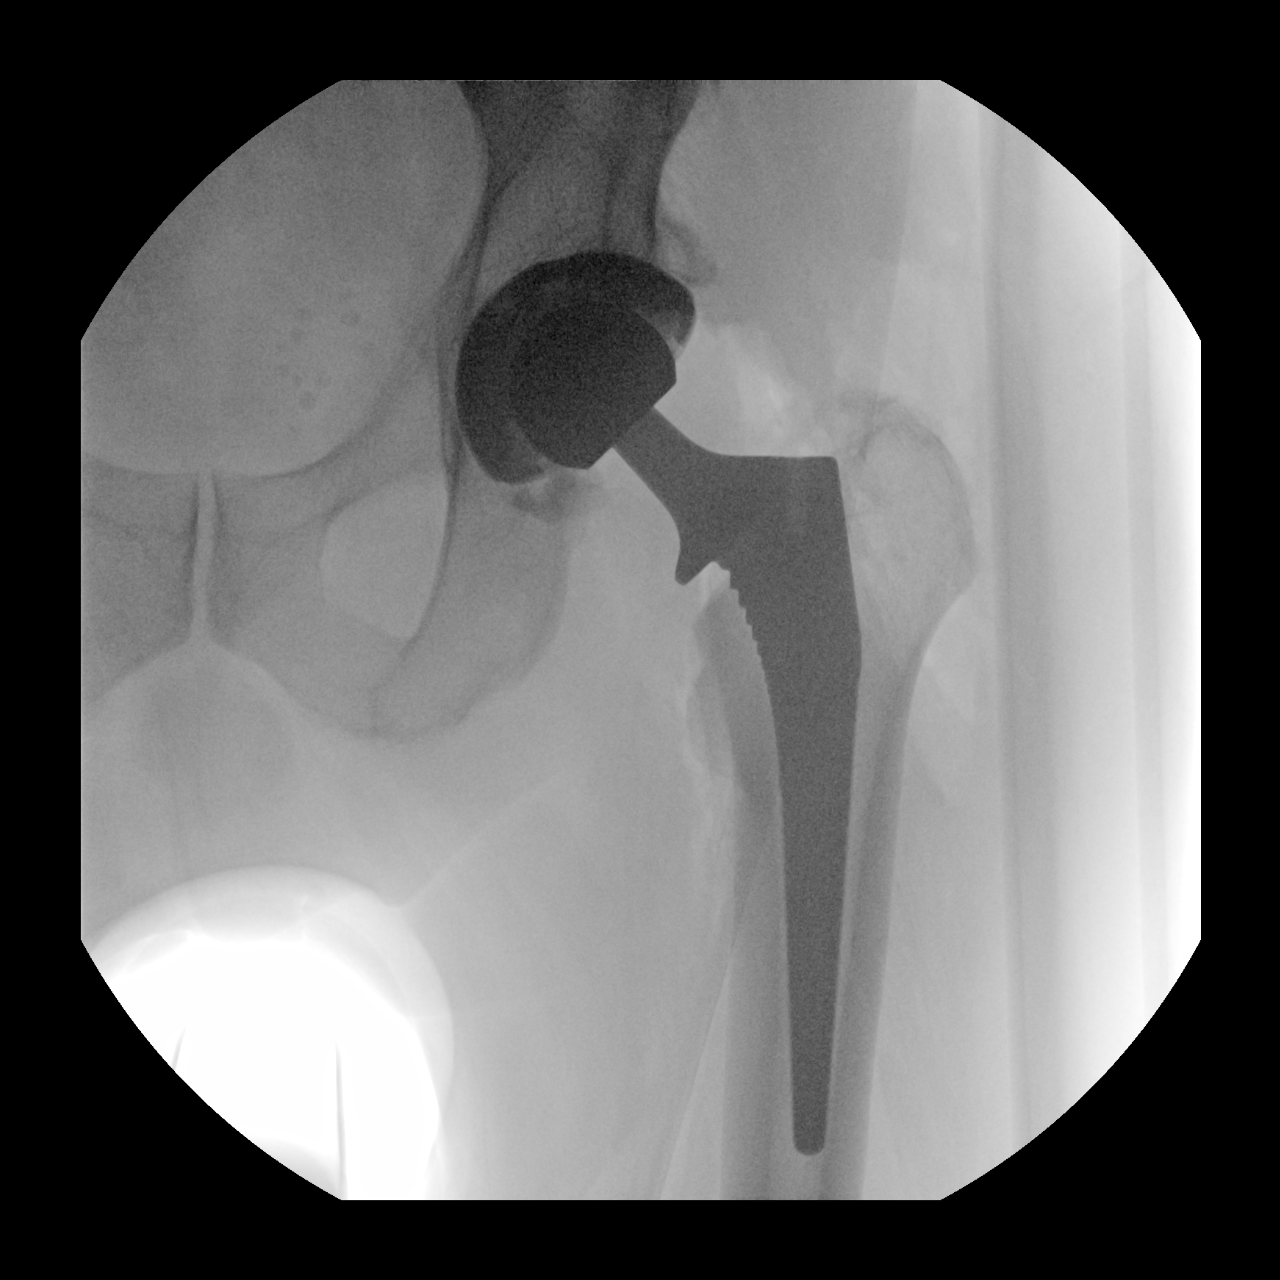
[im 2/3]
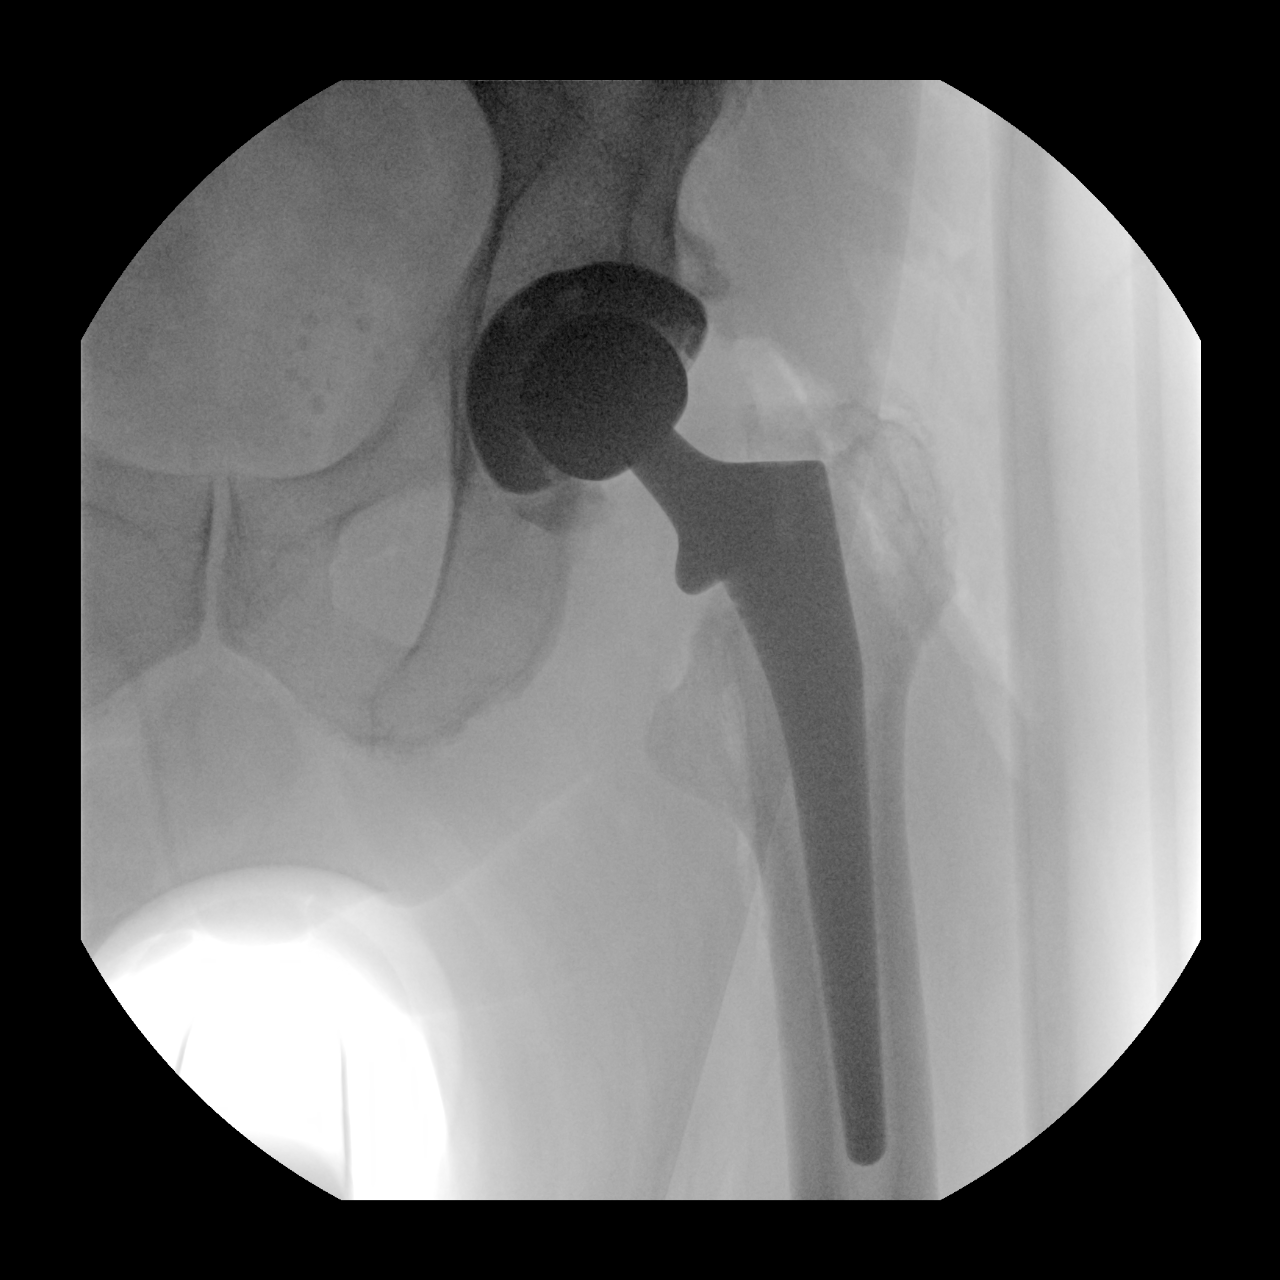
[im 3/3]
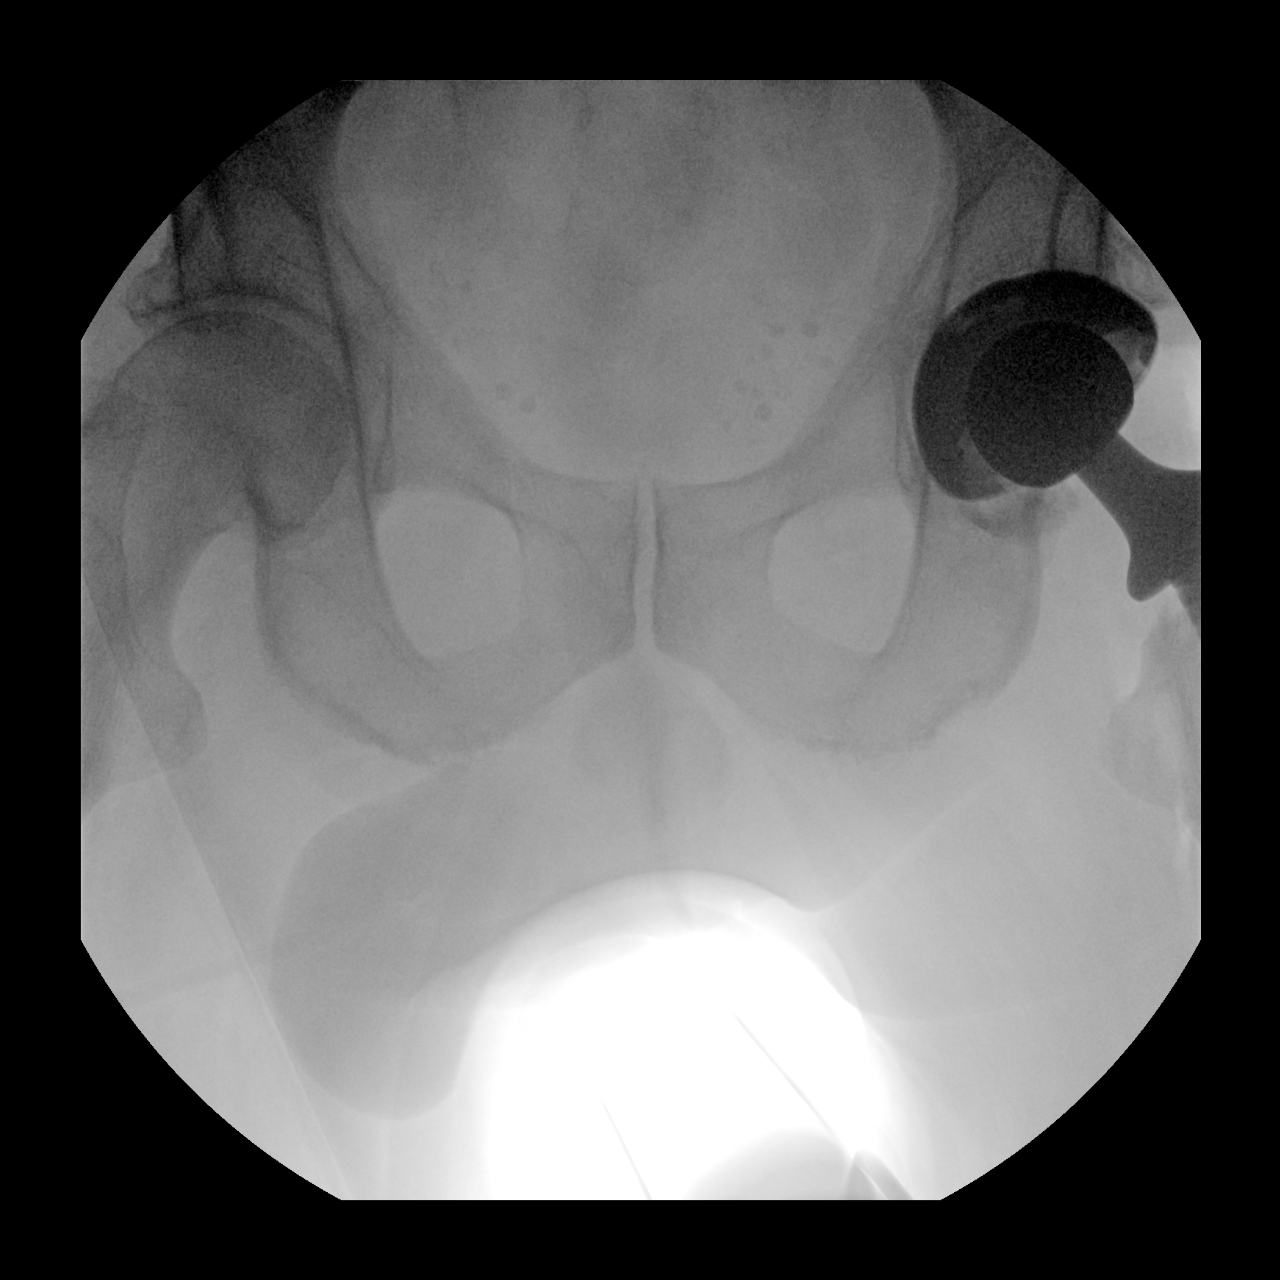

[3 of 3 positions shown; findings below may reference images not displayed]

FINDINGS: Three intraoperative fluoroscopic images were obtained of the left
hip. The left femoral and acetabular components appear to be well
situated.
IMPRESSION: Fluoroscopic guidance provided during left total hip arthroplasty.

## 2020-12-08 IMAGING — RF DG C-ARM 1-60 MIN-NO REPORT
1 series · 3 of 3 positions shown · non-contrast
Comparison: [DATE].

CLINICAL DATA: Left hip replacement.

EXAM:
OPERATIVE left HIP (WITH PELVIS IF PERFORMED) 3 VIEWS
TECHNIQUE: Fluoroscopic spot image(s) were submitted for interpretation
post-operatively.
Radiation exposure index: 3.3596 mGy.

[Series 1: unknown protocol · 0.20mm/px · 3 of 3 slices shown]
[im 1/3]
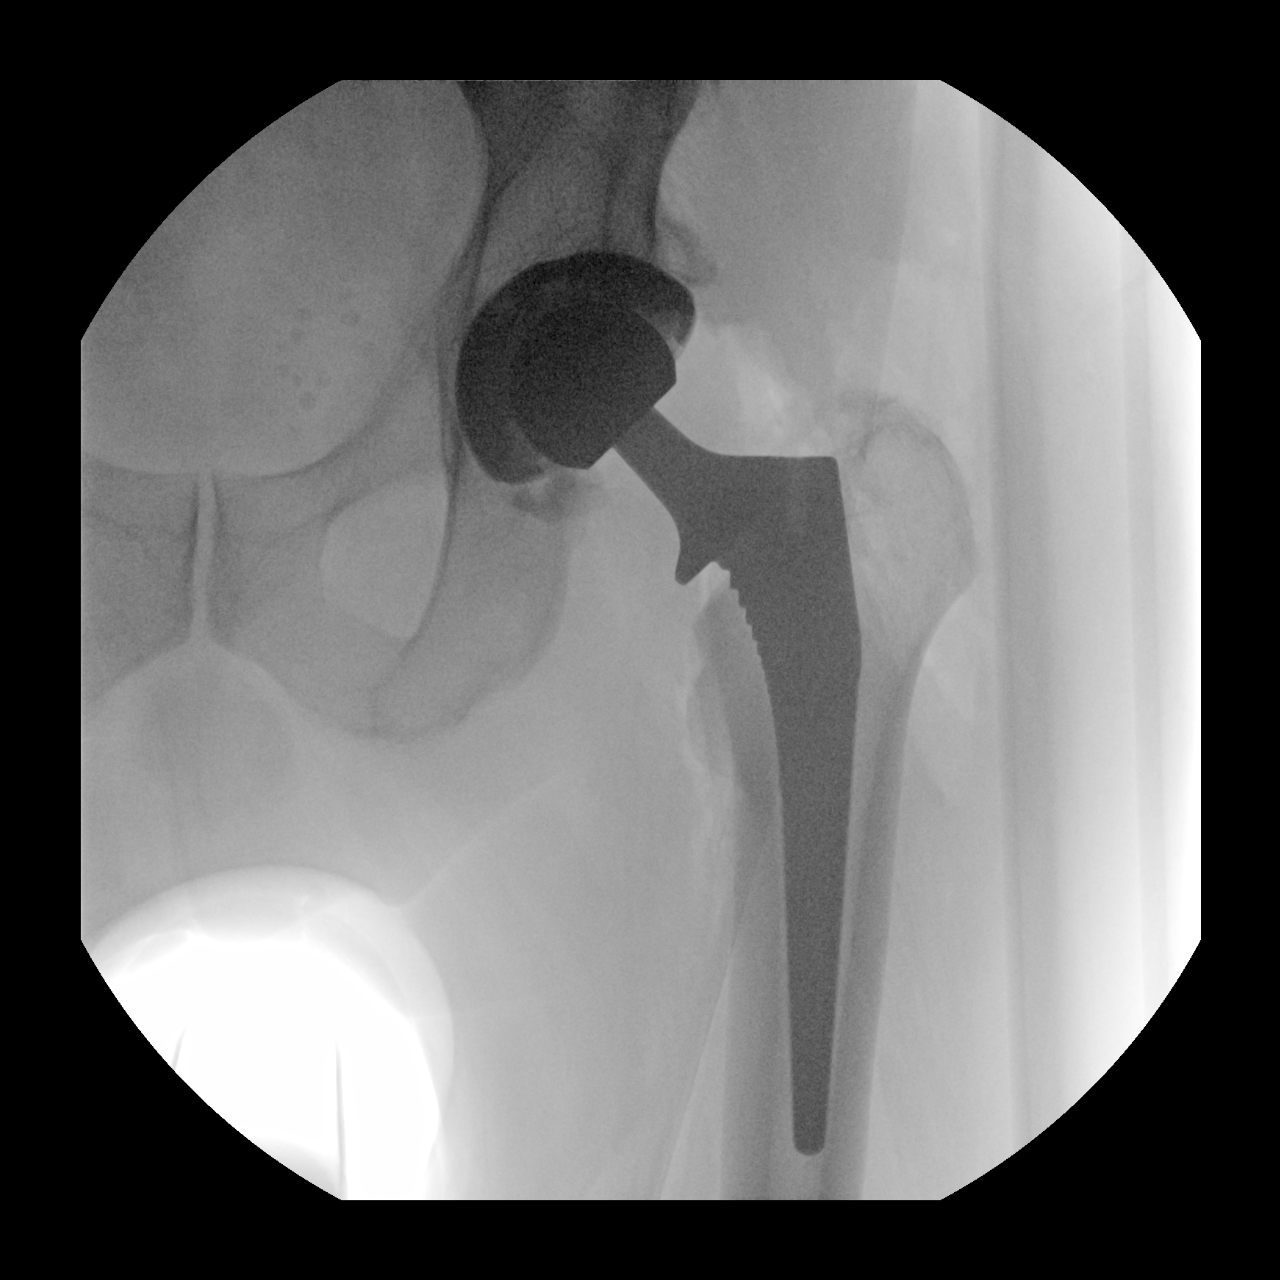
[im 2/3]
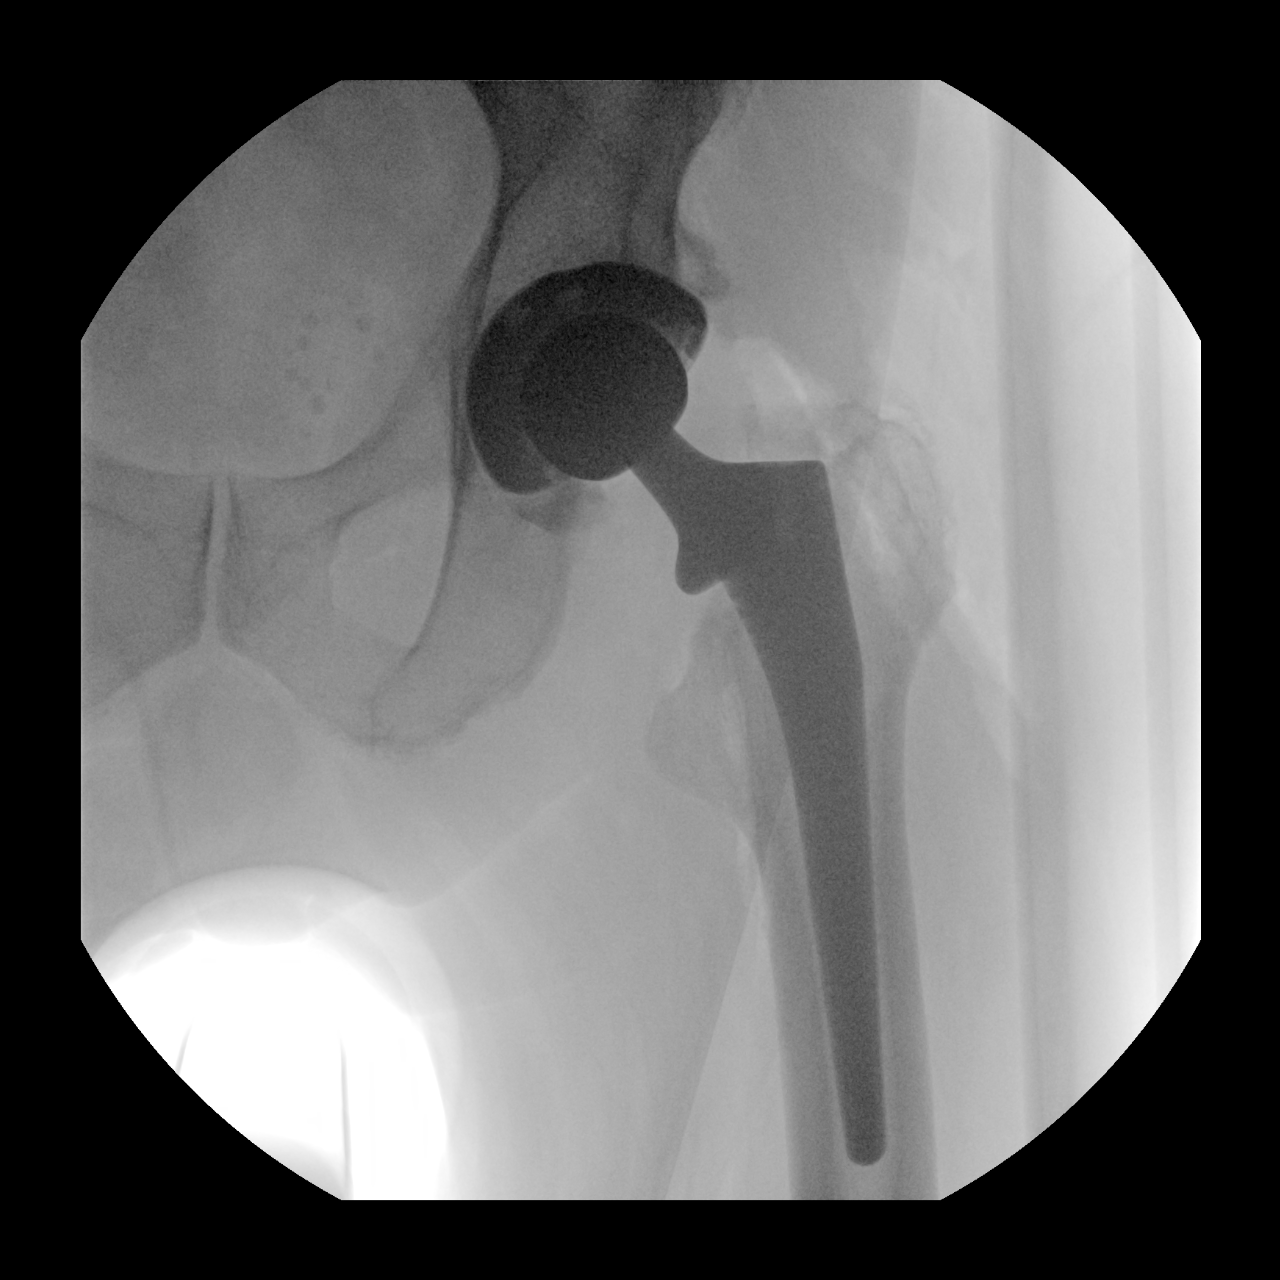
[im 3/3]
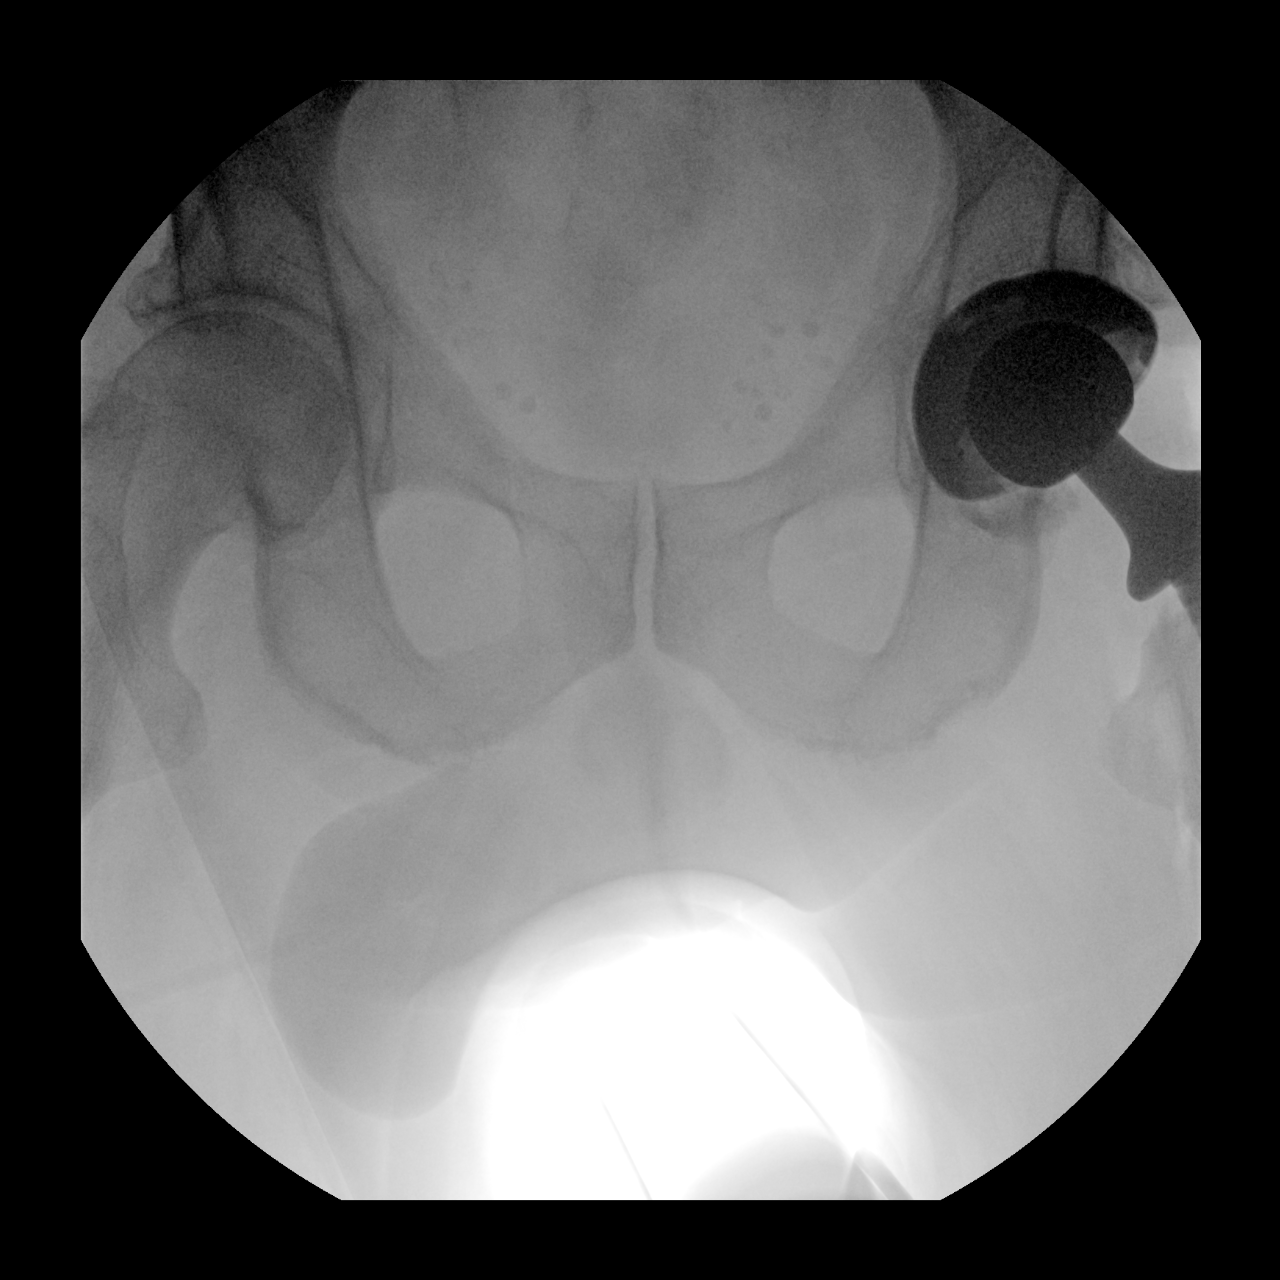

[3 of 3 positions shown; findings below may reference images not displayed]

FINDINGS: Three intraoperative fluoroscopic images were obtained of the left
hip. The left femoral and acetabular components appear to be well
situated.
IMPRESSION: Fluoroscopic guidance provided during left total hip arthroplasty.

## 2020-12-08 IMAGING — DX DG PORTABLE PELVIS
1 series · 1 of 1 positions shown · non-contrast
Comparison: None.

CLINICAL DATA: Left total hip replacement.

EXAM:
PORTABLE PELVIS 1-2 VIEWS

[pelvis ap]
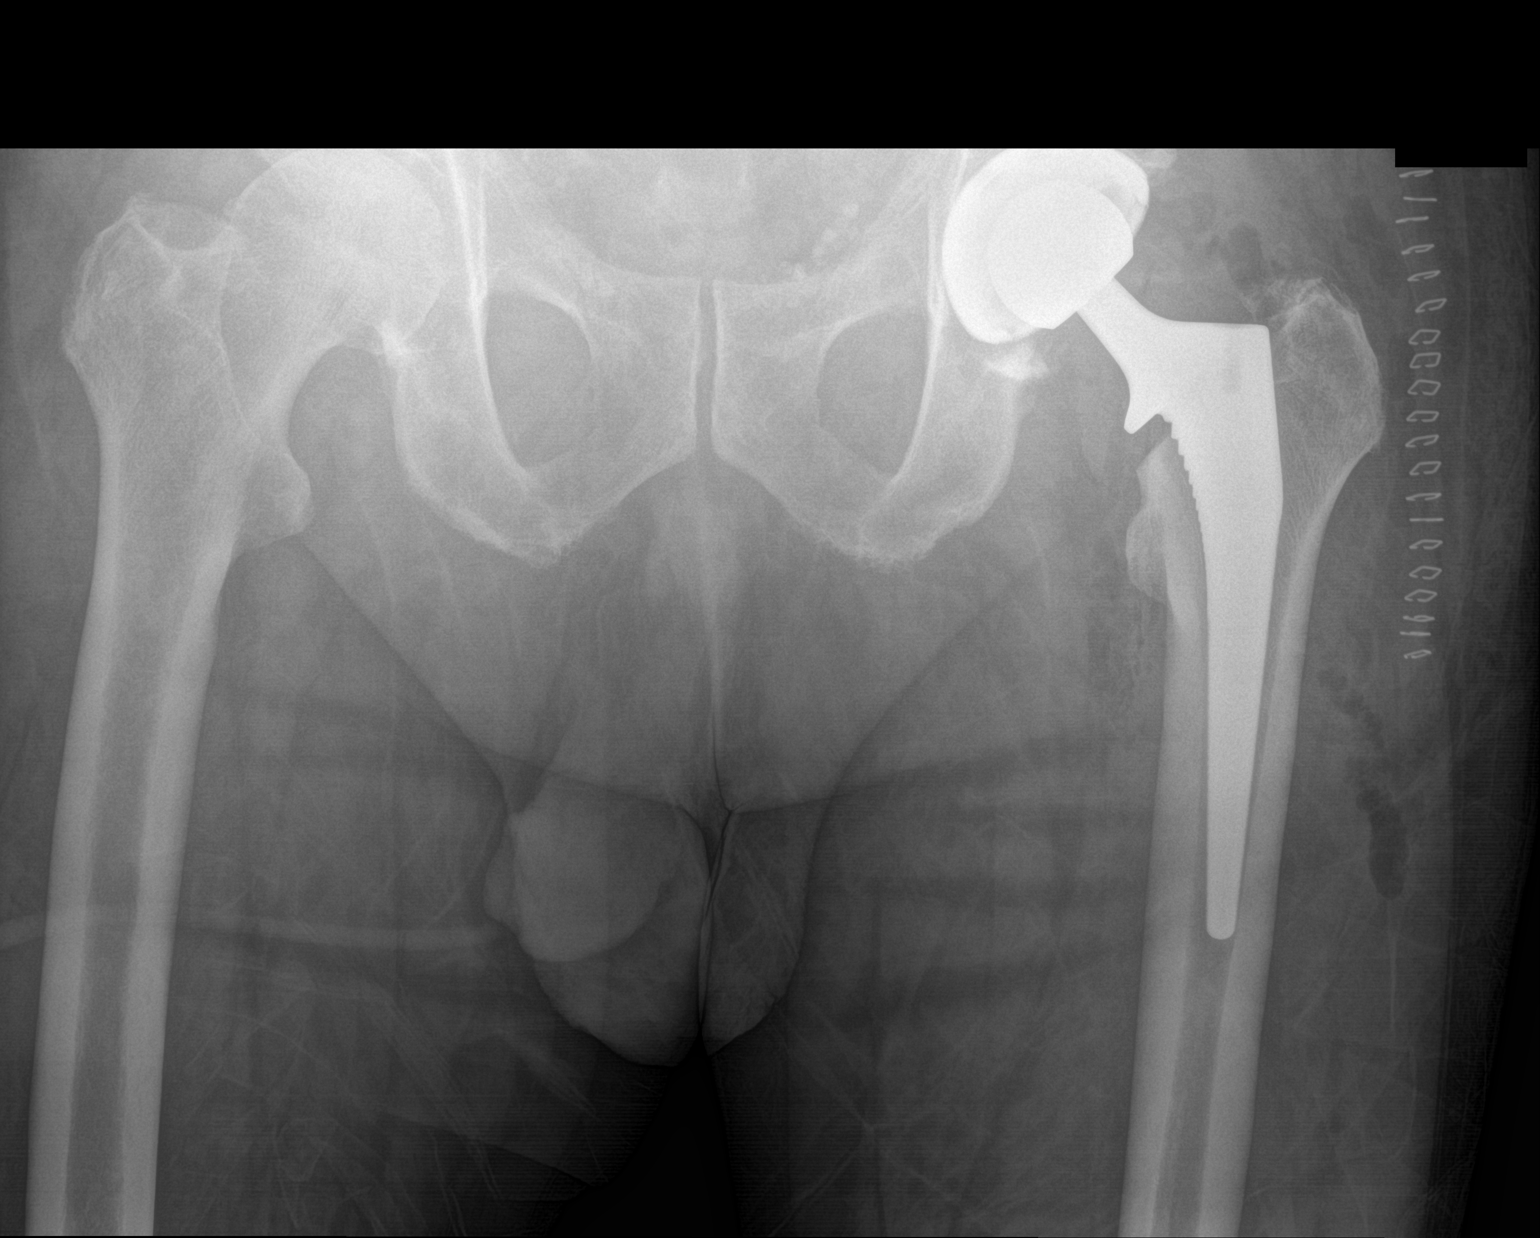

[1 of 1 positions shown; findings below may reference images not displayed]

FINDINGS: The left femoral and acetabular components are well situated.
Expected postoperative changes are seen in the surrounding soft
tissues.
IMPRESSION: Status post left total hip arthroplasty.

## 2020-12-08 SURGERY — ARTHROPLASTY, HIP, TOTAL, ANTERIOR APPROACH
Anesthesia: Spinal | Site: Hip | Laterality: Left

## 2020-12-08 MED ORDER — MAGNESIUM OXIDE 400 (241.3 MG) MG PO TABS
200.0000 mg | ORAL_TABLET | Freq: Every day | ORAL | Status: DC
  Administered 2020-12-09: 200 mg via ORAL
  Filled 2020-12-08 (×2): qty 1

## 2020-12-08 MED ORDER — HYDROCHLOROTHIAZIDE 25 MG PO TABS
25.0000 mg | ORAL_TABLET | Freq: Every day | ORAL | Status: DC
  Administered 2020-12-08 – 2020-12-09 (×2): 25 mg via ORAL
  Filled 2020-12-08 (×2): qty 1

## 2020-12-08 MED ORDER — TRANEXAMIC ACID-NACL 1000-0.7 MG/100ML-% IV SOLN
1000.0000 mg | INTRAVENOUS | Status: AC
  Administered 2020-12-08: 1000 mg via INTRAVENOUS
  Filled 2020-12-08: qty 100

## 2020-12-08 MED ORDER — FENTANYL CITRATE (PF) 100 MCG/2ML IJ SOLN
INTRAMUSCULAR | Status: AC
  Filled 2020-12-08: qty 2

## 2020-12-08 MED ORDER — MIDAZOLAM HCL 2 MG/2ML IJ SOLN
INTRAMUSCULAR | Status: AC
  Filled 2020-12-08: qty 2

## 2020-12-08 MED ORDER — MODAFINIL 200 MG PO TABS
200.0000 mg | ORAL_TABLET | Freq: Every day | ORAL | Status: DC
  Administered 2020-12-09: 200 mg via ORAL
  Filled 2020-12-08: qty 1

## 2020-12-08 MED ORDER — ACETAMINOPHEN 325 MG PO TABS: 325.0000 mg | ORAL_TABLET | Freq: Four times a day (QID) | ORAL | Status: DC | PRN

## 2020-12-08 MED ORDER — CEFAZOLIN SODIUM-DEXTROSE 2-4 GM/100ML-% IV SOLN
2.0000 g | INTRAVENOUS | Status: AC
  Administered 2020-12-08: 2 g via INTRAVENOUS
  Filled 2020-12-08: qty 100

## 2020-12-08 MED ORDER — OXYCODONE HCL 5 MG/5ML PO SOLN
5.0000 mg | Freq: Once | ORAL | Status: AC | PRN
Start: 2020-12-08 — End: 2020-12-08

## 2020-12-08 MED ORDER — OXYCODONE HCL 5 MG PO TABS
ORAL_TABLET | ORAL | Status: AC
  Filled 2020-12-08: qty 1

## 2020-12-08 MED ORDER — PROPOFOL 10 MG/ML IV BOLUS
INTRAVENOUS | Status: DC | PRN
  Administered 2020-12-08 (×2): 20 mg via INTRAVENOUS

## 2020-12-08 MED ORDER — METHOCARBAMOL 500 MG PO TABS
500.0000 mg | ORAL_TABLET | Freq: Four times a day (QID) | ORAL | Status: DC | PRN
  Administered 2020-12-08 – 2020-12-09 (×4): 500 mg via ORAL
  Filled 2020-12-08 (×4): qty 1

## 2020-12-08 MED ORDER — ZOLPIDEM TARTRATE 5 MG PO TABS: 5.0000 mg | ORAL_TABLET | Freq: Every evening | ORAL | Status: DC | PRN

## 2020-12-08 MED ORDER — BUPIVACAINE HCL (PF) 0.75 % IJ SOLN
INTRAMUSCULAR | Status: DC | PRN
  Administered 2020-12-08: 1.6 mL

## 2020-12-08 MED ORDER — ADULT MULTIVITAMIN W/MINERALS CH
1.0000 | ORAL_TABLET | Freq: Every day | ORAL | Status: DC
  Administered 2020-12-08 – 2020-12-09 (×2): 1 via ORAL
  Filled 2020-12-08 (×2): qty 1

## 2020-12-08 MED ORDER — ASPIRIN 81 MG PO CHEW
81.0000 mg | CHEWABLE_TABLET | Freq: Two times a day (BID) | ORAL | Status: DC
  Administered 2020-12-08 – 2020-12-09 (×2): 81 mg via ORAL
  Filled 2020-12-08 (×2): qty 1

## 2020-12-08 MED ORDER — LACTATED RINGERS IV SOLN: INTRAVENOUS | Status: DC

## 2020-12-08 MED ORDER — METOCLOPRAMIDE HCL 5 MG/ML IJ SOLN: 5.0000 mg | Freq: Three times a day (TID) | INTRAMUSCULAR | Status: DC | PRN

## 2020-12-08 MED ORDER — SODIUM CHLORIDE 0.9 % IR SOLN
Status: DC | PRN
  Administered 2020-12-08: 3000 mL

## 2020-12-08 MED ORDER — EPHEDRINE SULFATE-NACL 50-0.9 MG/10ML-% IV SOSY
PREFILLED_SYRINGE | INTRAVENOUS | Status: DC | PRN
  Administered 2020-12-08 (×4): 10 mg via INTRAVENOUS

## 2020-12-08 MED ORDER — EPHEDRINE 5 MG/ML INJ
INTRAVENOUS | Status: AC
  Filled 2020-12-08: qty 10

## 2020-12-08 MED ORDER — TOPIRAMATE 25 MG PO TABS
12.5000 mg | ORAL_TABLET | Freq: Every day | ORAL | Status: DC
  Administered 2020-12-08: 12.5 mg via ORAL
  Filled 2020-12-08 (×2): qty 0.5

## 2020-12-08 MED ORDER — METFORMIN HCL ER 500 MG PO TB24
500.0000 mg | ORAL_TABLET | Freq: Every day | ORAL | Status: DC
  Administered 2020-12-08: 500 mg via ORAL
  Filled 2020-12-08: qty 1

## 2020-12-08 MED ORDER — ORAL CARE MOUTH RINSE: 15.0000 mL | Freq: Once | OROMUCOSAL | Status: AC

## 2020-12-08 MED ORDER — PROPOFOL 1000 MG/100ML IV EMUL
INTRAVENOUS | Status: AC
  Filled 2020-12-08: qty 200

## 2020-12-08 MED ORDER — MENTHOL 3 MG MT LOZG: 1.0000 | LOZENGE | OROMUCOSAL | Status: DC | PRN

## 2020-12-08 MED ORDER — PHENOL 1.4 % MT LIQD: 1.0000 | OROMUCOSAL | Status: DC | PRN

## 2020-12-08 MED ORDER — LOSARTAN POTASSIUM-HCTZ 100-25 MG PO TABS: 1.0000 | ORAL_TABLET | Freq: Every day | ORAL | Status: DC

## 2020-12-08 MED ORDER — POLYETHYLENE GLYCOL 3350 17 G PO PACK: 17.0000 g | PACK | Freq: Every day | ORAL | Status: DC | PRN

## 2020-12-08 MED ORDER — PANTOPRAZOLE SODIUM 40 MG PO TBEC
40.0000 mg | DELAYED_RELEASE_TABLET | Freq: Every day | ORAL | Status: DC
  Administered 2020-12-08 – 2020-12-09 (×2): 40 mg via ORAL
  Filled 2020-12-08 (×2): qty 1

## 2020-12-08 MED ORDER — VITAMIN B-12 1000 MCG PO TABS
3000.0000 ug | ORAL_TABLET | Freq: Every day | ORAL | Status: DC
  Administered 2020-12-09: 3000 ug via ORAL
  Filled 2020-12-08: qty 3

## 2020-12-08 MED ORDER — ONDANSETRON HCL 4 MG/2ML IJ SOLN: 4.0000 mg | Freq: Four times a day (QID) | INTRAMUSCULAR | Status: DC | PRN

## 2020-12-08 MED ORDER — CEFAZOLIN SODIUM-DEXTROSE 1-4 GM/50ML-% IV SOLN
1.0000 g | Freq: Four times a day (QID) | INTRAVENOUS | Status: AC
  Administered 2020-12-08 (×2): 1 g via INTRAVENOUS
  Filled 2020-12-08 (×3): qty 50

## 2020-12-08 MED ORDER — SIMVASTATIN 20 MG PO TABS
20.0000 mg | ORAL_TABLET | Freq: Every evening | ORAL | Status: DC
  Administered 2020-12-08: 20 mg via ORAL
  Filled 2020-12-08: qty 1

## 2020-12-08 MED ORDER — ONDANSETRON HCL 4 MG/2ML IJ SOLN
INTRAMUSCULAR | Status: AC
  Filled 2020-12-08: qty 2

## 2020-12-08 MED ORDER — DOCUSATE SODIUM 100 MG PO CAPS
100.0000 mg | ORAL_CAPSULE | Freq: Two times a day (BID) | ORAL | Status: DC
  Administered 2020-12-08 – 2020-12-09 (×2): 100 mg via ORAL
  Filled 2020-12-08 (×2): qty 1

## 2020-12-08 MED ORDER — OXYCODONE HCL 5 MG PO TABS
5.0000 mg | ORAL_TABLET | Freq: Once | ORAL | Status: AC | PRN
  Administered 2020-12-08: 5 mg via ORAL

## 2020-12-08 MED ORDER — CARVEDILOL 12.5 MG PO TABS
12.5000 mg | ORAL_TABLET | Freq: Two times a day (BID) | ORAL | Status: DC
  Administered 2020-12-08 – 2020-12-09 (×2): 12.5 mg via ORAL
  Filled 2020-12-08 (×2): qty 1

## 2020-12-08 MED ORDER — LOSARTAN POTASSIUM 50 MG PO TABS
100.0000 mg | ORAL_TABLET | Freq: Every day | ORAL | Status: DC
  Administered 2020-12-08 – 2020-12-09 (×2): 100 mg via ORAL
  Filled 2020-12-08 (×2): qty 2

## 2020-12-08 MED ORDER — POVIDONE-IODINE 10 % EX SWAB
2.0000 "application " | Freq: Once | CUTANEOUS | Status: AC
  Administered 2020-12-08: 2 via TOPICAL

## 2020-12-08 MED ORDER — ONDANSETRON HCL 4 MG PO TABS: 4.0000 mg | ORAL_TABLET | Freq: Four times a day (QID) | ORAL | Status: DC | PRN

## 2020-12-08 MED ORDER — DIPHENHYDRAMINE HCL 12.5 MG/5ML PO ELIX: 12.5000 mg | ORAL_SOLUTION | ORAL | Status: DC | PRN

## 2020-12-08 MED ORDER — PROPOFOL 500 MG/50ML IV EMUL
INTRAVENOUS | Status: DC | PRN
  Administered 2020-12-08: 100 ug/kg/min via INTRAVENOUS

## 2020-12-08 MED ORDER — LIDOCAINE HCL (CARDIAC) PF 100 MG/5ML IV SOSY
PREFILLED_SYRINGE | INTRAVENOUS | Status: DC | PRN
  Administered 2020-12-08: 60 mg via INTRAVENOUS

## 2020-12-08 MED ORDER — LIDOCAINE 2% (20 MG/ML) 5 ML SYRINGE
INTRAMUSCULAR | Status: AC
  Filled 2020-12-08: qty 5

## 2020-12-08 MED ORDER — OXYCODONE HCL 5 MG PO TABS
10.0000 mg | ORAL_TABLET | ORAL | Status: DC | PRN
Start: 2020-12-08 — End: 2020-12-09
  Administered 2020-12-09 (×2): 10 mg via ORAL
  Filled 2020-12-08 (×2): qty 2

## 2020-12-08 MED ORDER — ONDANSETRON HCL 4 MG/2ML IJ SOLN: 4.0000 mg | Freq: Once | INTRAMUSCULAR | Status: DC | PRN

## 2020-12-08 MED ORDER — SODIUM CHLORIDE 0.9 % IV SOLN: INTRAVENOUS | Status: DC

## 2020-12-08 MED ORDER — CHLORHEXIDINE GLUCONATE 0.12 % MT SOLN
15.0000 mL | Freq: Once | OROMUCOSAL | Status: AC
  Administered 2020-12-08: 15 mL via OROMUCOSAL

## 2020-12-08 MED ORDER — HYDROMORPHONE HCL 1 MG/ML IJ SOLN
0.5000 mg | INTRAMUSCULAR | Status: DC | PRN
  Administered 2020-12-08: 0.5 mg via INTRAVENOUS
  Filled 2020-12-08: qty 1

## 2020-12-08 MED ORDER — ALUM & MAG HYDROXIDE-SIMETH 200-200-20 MG/5ML PO SUSP: 30.0000 mL | ORAL | Status: DC | PRN

## 2020-12-08 MED ORDER — DEXAMETHASONE SODIUM PHOSPHATE 10 MG/ML IJ SOLN
INTRAMUSCULAR | Status: DC | PRN
  Administered 2020-12-08: 10 mg via INTRAVENOUS

## 2020-12-08 MED ORDER — OXYCODONE HCL 5 MG PO TABS
5.0000 mg | ORAL_TABLET | ORAL | Status: DC | PRN
  Administered 2020-12-08: 5 mg via ORAL
  Administered 2020-12-08 – 2020-12-09 (×3): 10 mg via ORAL
  Filled 2020-12-08: qty 2
  Filled 2020-12-08: qty 1
  Filled 2020-12-08 (×2): qty 2

## 2020-12-08 MED ORDER — ONDANSETRON HCL 4 MG/2ML IJ SOLN
INTRAMUSCULAR | Status: DC | PRN
  Administered 2020-12-08: 4 mg via INTRAVENOUS

## 2020-12-08 MED ORDER — FENTANYL CITRATE (PF) 100 MCG/2ML IJ SOLN
INTRAMUSCULAR | Status: DC | PRN
  Administered 2020-12-08: 100 ug via INTRAVENOUS

## 2020-12-08 MED ORDER — METHOCARBAMOL 1000 MG/10ML IJ SOLN
500.0000 mg | Freq: Four times a day (QID) | INTRAMUSCULAR | Status: DC | PRN
  Filled 2020-12-08: qty 5

## 2020-12-08 MED ORDER — MIDAZOLAM HCL 5 MG/5ML IJ SOLN
INTRAMUSCULAR | Status: DC | PRN
  Administered 2020-12-08: 2 mg via INTRAVENOUS

## 2020-12-08 MED ORDER — METOCLOPRAMIDE HCL 5 MG PO TABS: 5.0000 mg | ORAL_TABLET | Freq: Three times a day (TID) | ORAL | Status: DC | PRN

## 2020-12-08 MED ORDER — 0.9 % SODIUM CHLORIDE (POUR BTL) OPTIME
TOPICAL | Status: DC | PRN
  Administered 2020-12-08: 1000 mL

## 2020-12-08 MED ORDER — DEXAMETHASONE SODIUM PHOSPHATE 10 MG/ML IJ SOLN
INTRAMUSCULAR | Status: AC
  Filled 2020-12-08: qty 1

## 2020-12-08 MED ORDER — FENTANYL CITRATE (PF) 100 MCG/2ML IJ SOLN
25.0000 ug | INTRAMUSCULAR | Status: DC | PRN
  Administered 2020-12-08 (×2): 50 ug via INTRAVENOUS

## 2020-12-08 SURGICAL SUPPLY — 34 items
BLADE SAW SGTL 18X1.27X75 (BLADE) ×2 IMPLANT
COLLAR OFFSET CORAIL SZ 12 HIP (Stem) ×1 IMPLANT
CORAIL OFFSET COLLAR SZ 12 HIP (Stem) ×2 IMPLANT
COVER PERINEAL POST (MISCELLANEOUS) ×2 IMPLANT
COVER SURGICAL LIGHT HANDLE (MISCELLANEOUS) ×2 IMPLANT
CUP SECTOR GRIPTON 58MM (Orthopedic Implant) ×2 IMPLANT
DRAPE STERI IOBAN 125X83 (DRAPES) ×2 IMPLANT
DRAPE U-SHAPE 47X51 STRL (DRAPES) ×4 IMPLANT
DRESSING AQUACEL AG SP 3.5X10 (GAUZE/BANDAGES/DRESSINGS) ×1 IMPLANT
DRSG AQUACEL AG ADV 3.5X10 (GAUZE/BANDAGES/DRESSINGS) ×2 IMPLANT
DRSG AQUACEL AG SP 3.5X10 (GAUZE/BANDAGES/DRESSINGS) ×2
DURAPREP 26ML APPLICATOR (WOUND CARE) ×2 IMPLANT
ELECT REM PT RETURN 15FT ADLT (MISCELLANEOUS) ×2 IMPLANT
GAUZE XEROFORM 1X8 LF (GAUZE/BANDAGES/DRESSINGS) ×2 IMPLANT
GLOVE SRG 8 PF TXTR STRL LF DI (GLOVE) ×2 IMPLANT
GLOVE SURG ENC MOIS LTX SZ7.5 (GLOVE) ×2 IMPLANT
GLOVE SURG LTX SZ8 (GLOVE) ×2 IMPLANT
GLOVE SURG UNDER POLY LF SZ8 (GLOVE) ×2
GOWN STRL REUS W/TWL XL LVL3 (GOWN DISPOSABLE) ×4 IMPLANT
HANDPIECE INTERPULSE COAX TIP (DISPOSABLE) ×1
HEAD CERAMIC 36 PLUS5 (Hips) ×2 IMPLANT
HOLDER FOLEY CATH W/STRAP (MISCELLANEOUS) ×2 IMPLANT
KIT TURNOVER KIT A (KITS) ×2 IMPLANT
LINER NEUTRAL 52X36X58N (Liner) ×2 IMPLANT
PACK ANTERIOR HIP CUSTOM (KITS) ×2 IMPLANT
PENCIL SMOKE EVACUATOR (MISCELLANEOUS) IMPLANT
SET HNDPC FAN SPRY TIP SCT (DISPOSABLE) ×1 IMPLANT
SUT ETHIBOND NAB CT1 #1 30IN (SUTURE) ×2 IMPLANT
SUT ETHILON 2 0 PS N (SUTURE) IMPLANT
SUT MNCRL AB 4-0 PS2 18 (SUTURE) IMPLANT
SUT VIC AB 0 CT1 36 (SUTURE) ×2 IMPLANT
SUT VIC AB 1 CT1 36 (SUTURE) ×2 IMPLANT
SUT VIC AB 2-0 CT1 27 (SUTURE) ×2
SUT VIC AB 2-0 CT1 TAPERPNT 27 (SUTURE) ×2 IMPLANT

## 2020-12-08 NOTE — Anesthesia Procedure Notes (Signed)
Spinal  Patient location during procedure: OR Start time: 12/08/2020 8:54 AM End time: 12/08/2020 8:58 AM Reason for block: surgical anesthesia Staffing Performed: anesthesiologist  Anesthesiologist: Audry Pili, MD Preanesthetic Checklist Completed: patient identified, IV checked, risks and benefits discussed, surgical consent, monitors and equipment checked, pre-op evaluation and timeout performed Spinal Block Patient position: sitting Prep: DuraPrep Patient monitoring: heart rate, cardiac monitor, continuous pulse ox and blood pressure Approach: midline Location: L3-4 Injection technique: single-shot Needle Needle type: Pencan  Needle gauge: 24 G Additional Notes Consent was obtained prior to the procedure with all questions answered and concerns addressed. Risks including, but not limited to, bleeding, infection, nerve damage, paralysis, failed block, inadequate analgesia, allergic reaction, high spinal, itching, and headache were discussed and the patient wished to proceed. Functioning IV was confirmed and monitors were applied. Sterile prep and drape, including hand hygiene, mask, and sterile gloves were used. The patient was positioned and the spine was prepped. The skin was anesthetized with lidocaine. Free flow of clear CSF was obtained prior to injecting local anesthetic into the CSF. The spinal needle aspirated freely following injection. The needle was carefully withdrawn. The patient tolerated the procedure well.   Alexander Don, MD

## 2020-12-08 NOTE — Interval H&P Note (Signed)
History and Physical Interval Note: The patient understands fully that he is here today for a left total hip replacement to treat his left hip osteoarthritis.  There has been no acute or interval changes in medical status.  See recent H&P.  The risks and benefits of surgery been explained in detail and informed consent is obtained.  The left hip has been marked.  12/08/2020 6:57 AM  Brandt Loosen  has presented today for surgery, with the diagnosis of osteoarthritis left hip.  The various methods of treatment have been discussed with the patient and family. After consideration of risks, benefits and other options for treatment, the patient has consented to  Procedure(s) with comments: LEFT TOTAL HIP ARTHROPLASTY ANTERIOR APPROACH (Left) - Needs RNFA as a surgical intervention.  The patient's history has been reviewed, patient examined, no change in status, stable for surgery.  I have reviewed the patient's chart and labs.  Questions were answered to the patient's satisfaction.     Mcarthur Rossetti

## 2020-12-08 NOTE — Transfer of Care (Signed)
Immediate Anesthesia Transfer of Care Note  Patient: Alexander [name]  Procedure(s) Performed: LEFT TOTAL HIP ARTHROPLASTY ANTERIOR APPROACH (Left Hip)  Patient Location: PACU  Anesthesia Type:Spinal  Level of Consciousness: awake, alert  and oriented  Airway & Oxygen Therapy: Patient Spontanous Breathing and Patient connected to nasal cannula oxygen  Post-op Assessment: Report given to RN and Post -op Vital signs reviewed and stable  Post vital signs: Reviewed and stable  Last Vitals:  Vitals Value Taken Time  BP 132/88 12/08/20 1045  Temp    Pulse 53 12/08/20 1051  Resp 15 12/08/20 1051  SpO2 88 % 12/08/20 1051  Vitals shown include unvalidated device data.  Last Pain:  Vitals:   12/08/20 0630  TempSrc: Oral         Complications: No complications documented.

## 2020-12-08 NOTE — Care Plan (Signed)
Ortho Bundle Case Management Note  Patient Details  Name: Alexander [name] MRN: [id number] Date of Birth: [birth year]   [facility] call to patient to discuss his upcoming Left total hip replacement with Dr. [name] on Friday, 12/08/20. He is an Ortho bundle patient through [facility] and is agreeable to case management. Spoke with wife and reviewed all post op care instructions. Plan is to discharge home with spouse assisting. Patient states they have a FWW already (will be using family member's and is pretty sure they can use); declined 3in1 at this time, but will leave it up to therapy to determine if needed after surgery. Anticipate HHPT will be needed for a few visits after discharge. Choice provided and referral made to [facility] ([facility] Will continue to follow.                DME Arranged:  Patient reports a family member has a [place] he can use; declined 3in1 at time of call- will defer to therapy if needed after surgery DME Agency:     HH Arranged:  PT [place]:  [facility] (now [facility])  Additional Comments: Please contact me with any questions of if this plan should need to change.  Jamse Arn, RN, BSN, [employer]  [phone] 12/08/2020, 10:19 AM

## 2020-12-08 NOTE — Evaluation (Signed)
Physical Therapy Evaluation Patient Details Name: Alexander [name] MRN: [id number] DOB: 11-24-1950 Today's Date: 12/08/2020   History of Present Illness  Patient is 70 y.o. male s/p Lt THA anterior approach on 12/09/19 with PMH significant for OA, HTN, HLD, neck pain, OSA, CTR bil.    Clinical Impression  DELAWRENCE FRIDMAN is a 70 y.o. male POD 0 s/p Lt THA. Patient reports independence with mobility at baseline. Patient is now limited by functional impairments (see PT problem list below) and requires min assist for bed mobility and transfers with RW. Patient was limited this session by paraesthesia and weakness related to spinal block and dizziness/lightheadedness with sit<>stand. Patient returned to sitting EOB and symptoms remained present and Min assist to return to supine provided where symptoms resolved. Patient will benefit from continued skilled PT interventions to address impairments and progress towards PLOF. Acute PT will follow to progress mobility and stair training in preparation for safe discharge home.     Follow Up Recommendations Follow surgeon's recommendation for DC plan and follow-up therapies;Home health PT    Equipment Recommendations  None recommended by PT    Recommendations for Other Services       Precautions / Restrictions Precautions Precautions: Fall Restrictions Weight Bearing Restrictions: No Other Position/Activity Restrictions: WBAT      Mobility  Bed Mobility Overal bed mobility: Needs Assistance Bed Mobility: Supine to Sit;Sit to Supine     Supine to sit: Min assist;HOB elevated Sit to supine: Min assist   General bed mobility comments: min cues for sequencing and use of bed rail, assist to bring Lt LE off EOB. pt taking extra time to scoot forward. Min assist to raise Bil LE's onto EOB due to c/o dizzy/lightheaded sensation.    Transfers Overall transfer level: Needs assistance Equipment used: Rolling walker (2 wheeled) Transfers: Sit  to/from Stand Sit to Stand: Min assist         General transfer comment: Min cues for hand placement/technique with RW. Pt with posterior weight shift onto heels in standing and cues needed to shift weight inot great toes. Min +2 assist to steady and pt c/o dizziness/lightheaded sensation in standing and reported history of vasovagal episodes. returned to sit EOB.  Ambulation/Gait                Stairs            Wheelchair Mobility    Modified Rankin (Stroke Patients Only)       Balance Overall balance assessment: Needs assistance Sitting-balance support: Feet supported Sitting balance-Leahy Scale: Good     Standing balance support: During functional activity;Bilateral upper extremity supported Standing balance-Leahy Scale: Poor                               Pertinent Vitals/Pain Pain Assessment: 0-10 Pain Score: 6  Pain Location: Lt hip Pain Descriptors / Indicators: Discomfort;Burning;Guarding Pain Intervention(s): Limited activity within patient's tolerance;Monitored during session;Premedicated before session;Repositioned;Ice applied    Home Living Family/patient expects to be discharged to:: Private residence Living Arrangements: Spouse/significant other Available Help at Discharge: Family Type of Home: House Home Access: Stairs to enter Entrance Stairs-Rails: Right Entrance Stairs-Number of Steps: 1+1 Home Layout: One level Home Equipment: Environmental consultant - 2 wheels;Bedside commode      Prior Function Level of Independence: Independent               Hand Dominance   Dominant Hand: Left  Extremity/Trunk Assessment   Upper Extremity Assessment Upper Extremity Assessment: Overall WFL for tasks assessed    Lower Extremity Assessment Lower Extremity Assessment:  (pt with good SLR on Rt, able to contract glutes; some parasthesia in buttock and feet.)    Cervical / Trunk Assessment Cervical / Trunk Assessment: Normal   Communication   Communication: No difficulties  Cognition Arousal/Alertness: Awake/alert Behavior During Therapy: WFL for tasks assessed/performed Overall Cognitive Status: Within Functional Limits for tasks assessed                                        General Comments      Exercises     Assessment/Plan    PT Assessment Patient needs continued PT services  PT Problem List Decreased strength;Decreased range of motion;Decreased activity tolerance;Decreased mobility;Decreased balance;Decreased knowledge of use of DME;Decreased safety awareness;Decreased knowledge of precautions;Pain       PT Treatment Interventions DME instruction;Gait training;Stair training;Functional mobility training;Therapeutic activities;Therapeutic exercise;Balance training;Patient/family education    PT Goals (Current goals can be found in the Care Plan section)  Acute Rehab PT Goals Patient Stated Goal: get recovered and moving more PT Goal Formulation: With patient Time For Goal Achievement: 12/15/20 Potential to Achieve Goals: Good    Frequency 7X/week   Barriers to discharge        Co-evaluation               AM-PAC PT "6 Clicks" Mobility  Outcome Measure Help needed turning from your back to your side while in a flat bed without using bedrails?: A Little Help needed moving from lying on your back to sitting on the side of a flat bed without using bedrails?: A Little Help needed moving to and from a bed to a chair (including a wheelchair)?: A Lot Help needed standing up from a chair using your arms (e.g., wheelchair or bedside chair)?: A Lot Help needed to walk in hospital room?: A Lot Help needed climbing 3-5 steps with a railing? : A Lot 6 Click Score: 14    End of Session Equipment Utilized During Treatment: Gait belt Activity Tolerance: Patient tolerated treatment well Patient left: in bed;with call bell/phone within reach;with family/visitor present Nurse  Communication: Mobility status PT Visit Diagnosis: Muscle weakness (generalized) (M62.81);Difficulty in walking, not elsewhere classified (R26.2)    Time: 8338-2505 PT Time Calculation (min) (ACUTE ONLY): 23 min   Charges:   PT Evaluation $PT Eval Low Complexity: 1 Low PT Treatments $Therapeutic Activity: 8-22 mins        Verner Mould, DPT Acute Rehabilitation Services Office [phone] Pager [phone]    Jacques Navy 12/08/2020, 3:20 PM

## 2020-12-08 NOTE — Anesthesia Postprocedure Evaluation (Signed)
Anesthesia Post Note  Patient: Alexander [name]  Procedure(s) Performed: LEFT TOTAL HIP ARTHROPLASTY ANTERIOR APPROACH (Left Hip)     Patient location during evaluation: PACU Anesthesia Type: Spinal Level of consciousness: awake and alert Pain management: pain level controlled Vital Signs Assessment: post-procedure vital signs reviewed and stable Respiratory status: spontaneous breathing and respiratory function stable Cardiovascular status: blood pressure returned to baseline and stable Postop Assessment: spinal receding and no apparent nausea or vomiting Anesthetic complications: no   No complications documented.  Last Vitals:  Vitals:   12/08/20 1215 12/08/20 1230  BP: (!) 153/102 (!) 153/97  Pulse: (!) 47 62  Resp: 11 12  Temp: (!) 36.1 C (!) 36.4 C  SpO2: 99% 100%    Last Pain:  Vitals:   12/08/20 1230  TempSrc: Oral  PainSc: [place]

## 2020-12-08 NOTE — Brief Op Note (Signed)
12/08/2020  10:24 AM  PATIENT:  Alexander [name]  70 y.o. male  PRE-OPERATIVE DIAGNOSIS:  osteoarthritis left hip  POST-OPERATIVE DIAGNOSIS:  osteoarthritis left hip  PROCEDURE:  Procedure(s) with comments: LEFT TOTAL HIP ARTHROPLASTY ANTERIOR APPROACH (Left) - Needs RNFA  SURGEON:  Surgeon(s) and Role:    Mcarthur Rossetti, MD - Primary  ANESTHESIA:   spinal  EBL:  350 mL   COUNTS:  YES  DICTATION: .Other Dictation: Dictation Number [id number]  PLAN OF CARE: Admit for overnight observation  PATIENT DISPOSITION:  PACU - hemodynamically stable.   Delay start of Pharmacological VTE agent (>24hrs) due to surgical blood loss or risk of bleeding: no

## 2020-12-08 NOTE — Op Note (Signed)
NAME: Alexander [name], Alexander [name] MEDICAL RECORD NO: [id number] ACCOUNT NO: [id number] DATE OF BIRTH: 1951-01-21 FACILITY: [facility]: [id number] PHYSICIAN: Lind Guest. [name], MD  Operative Report   DATE OF PROCEDURE: 12/08/2020  PREOPERATIVE DIAGNOSIS:  Primary osteoarthritis and degenerative joint disease, left hip.  POSTOPERATIVE DIAGNOSIS:  Primary osteoarthritis and degenerative joint disease, left hip.  PROCEDURE:  Left total hip arthroplasty through direct anterior approach.  IMPLANTS:  DePuy sector Gription acetabular component size 58, size 36+0 neutral polyethylene liner, size 12 Corail femoral component with high offset, size 36+5 ceramic hip ball.  SURGEON:  Lind Guest. [name], MD  ANESTHESIA:  Spinal.  ANTIBIOTICS:  2 g IV Ancef.  BLOOD LOSS:  350 mL.  COMPLICATIONS:  None.  INDICATIONS:  The patient is a very pleasant 70 year old gentleman with debilitating arthritis involving his left hip.  This has been getting significantly worse over the last year.  This has been well documented with clinical exam and x-rays.  He has  tried and failed all forms of conservative treatment.  At this point, his left hip pain is daily and is detrimentally affecting his mobility, his quality of life, and his activities of daily living to the point he does wish to proceed with a total hip  arthroplasty.  We talked about the risk of acute blood loss anemia, nerve or vessel injury, fracture, infection, dislocation, DVT, and implant failure as well as skin and soft tissue issues.  We talked about our goals being decreased pain, improved  mobility, and overall improved quality of life.  DESCRIPTION OF PROCEDURE:  After informed consent was obtained and appropriate left hip was marked, he was brought to the operating room and sat up on a stretcher where spinal anesthesia was obtained.  He was laid in supine position on the stretcher.  A  Foley catheter was placed.  I assessed his leg length  and he was just slightly short on the left than the right.  Traction boots were placed on both his feet.  Next, he was placed supine on the Hana fracture table, the perineal post in place and both  legs in line skeletal traction devices, no traction applied.  His left operative hip was prepped and draped with DuraPrep and sterile drapes.  A timeout was called, and he was identified as correct patient, correct left hip.  I then made an incision just  inferior and posterior to the anterior superior iliac spine and carried this obliquely down the leg.  Dissected down tensor fascia lata muscle.  Tensor fascia was then divided longitudinally to proceed with direct anterior approach to the hip.  We  identified and cauterized circumflex vessels, then identified the hip capsule, opened up the hip capsule in L-type format finding a moderate joint effusion and significant periarticular osteophytes around the femoral head and neck.  I placed curved  retractors around the medial and lateral femoral neck within the joint capsule, made a femoral neck cut with an oscillating saw just proximal to the lesser trochanter and completed this with an osteotome.  I placed a corkscrew guide in the femoral head  and removed the femoral head in its entirety and found a wide area devoid of cartilage.  I then placed a bent Hohmann over the medial acetabular rim and removed remnants of the acetabular labrum and other debris.  I then began reaming in stepwise  increments from a size 43 reamer going all the way up to size 57 with all reamers placed under direct visualization.  Last reamer was placed under direct fluoroscopy, so we could obtain our depth of reaming, our inclination and anteversion.  I then  placed real DePuy sector Gription acetabular component size 58 and a 36+0 neutral polyethylene liner.  Attention was then turned to the femur.  With the leg externally rotated to 120 degrees, extended, and adducted, we were able to place  a Mueller  retractor medially and Hohman retractor behind the greater trochanter.  I used a box cutting osteotome to enter femoral canal and a rongeur to lateralize, then began broaching using the Corail broaching system from a size 8 going up to size 12.  With  size 12 in place, we trialed a standard offset femoral neck and we went with a 36+1.5 hip ball, reduced this in acetabulum and it definitely is more offset and leg length.  We dislocated the hip, removed the trial components.  We placed the real Corail  femoral component size 12, but with high offset.  We went with a 36+5 ceramic hip ball and reduced this in acetabulum.  At that point, we were definitely pleased with leg length, offset, range of motion, and stability assessed mechanically and  radiographically.  We then irrigated the soft tissue with normal saline solution using pulsatile lavage.  We dried the hip real well and then reapproximated the joint capsule with interrupted #1 Ethibond suture and #1 Vicryl was used to close the tensor  fascia and 0 Vicryl was used to close the deep tissue.  The subcuticular layer was closed with 2-0 nylon and the skin was closed with staples.  Aquacel dressing was applied.  He was taken off the Hana table and taken to recovery room in stable condition  with all final counts being correct and no complications noted.   [facility] D: 12/08/2020 10:22:55 am T: 12/08/2020 4:30:00 pm  JOB: [id number]/ [id number]

## 2020-12-09 DIAGNOSIS — M1612 Unilateral primary osteoarthritis, left hip: Secondary | ICD-10-CM | POA: Diagnosis not present

## 2020-12-09 DIAGNOSIS — I10 Essential (primary) hypertension: Secondary | ICD-10-CM | POA: Diagnosis not present

## 2020-12-09 DIAGNOSIS — Z79899 Other long term (current) drug therapy: Secondary | ICD-10-CM | POA: Diagnosis not present

## 2020-12-09 DIAGNOSIS — Z9989 Dependence on other enabling machines and devices: Secondary | ICD-10-CM | POA: Diagnosis not present

## 2020-12-09 DIAGNOSIS — R7303 Prediabetes: Secondary | ICD-10-CM | POA: Diagnosis not present

## 2020-12-09 DIAGNOSIS — Z85828 Personal history of other malignant neoplasm of skin: Secondary | ICD-10-CM | POA: Diagnosis not present

## 2020-12-09 LAB — CBC
HCT: 40.1 % (ref 39.0–52.0)
Hemoglobin: 13.2 g/dL (ref 13.0–17.0)
MCH: 31.2 pg (ref 26.0–34.0)
MCHC: 32.9 g/dL (ref 30.0–36.0)
MCV: 94.8 fL (ref 80.0–100.0)
Platelets: 185 10*3/uL (ref 150–400)
RBC: 4.23 MIL/uL (ref 4.22–5.81)
RDW: 12.9 % (ref 11.5–15.5)
WBC: 10.7 10*3/uL — ABNORMAL HIGH (ref 4.0–10.5)
nRBC: 0 % (ref 0.0–0.2)

## 2020-12-09 LAB — BASIC METABOLIC PANEL
Anion gap: 8 (ref 5–15)
BUN: 15 mg/dL (ref 8–23)
CO2: 27 mmol/L (ref 22–32)
Calcium: 8.5 mg/dL — ABNORMAL LOW (ref 8.9–10.3)
Chloride: 101 mmol/L (ref 98–111)
Creatinine, Ser: 0.83 mg/dL (ref 0.61–1.24)
GFR, Estimated: >60 mL/min (ref >60)
Glucose, Bld: 145 mg/dL — ABNORMAL HIGH (ref 70–99)
Potassium: 3.8 mmol/L (ref 3.5–5.1)
Sodium: 136 mmol/L (ref 135–145)

## 2020-12-09 MED ORDER — OXYCODONE HCL 5 MG PO TABS: 5.0000 mg | ORAL_TABLET | Freq: Four times a day (QID) | ORAL | 0 refills | Status: DC | PRN

## 2020-12-09 MED ORDER — ASPIRIN 81 MG PO CHEW: 81.0000 mg | CHEWABLE_TABLET | Freq: Two times a day (BID) | ORAL | 0 refills | Status: DC

## 2020-12-09 MED ORDER — METHOCARBAMOL 500 MG PO TABS: 500.0000 mg | ORAL_TABLET | Freq: Four times a day (QID) | ORAL | 1 refills | Status: DC | PRN

## 2020-12-09 NOTE — Plan of Care (Signed)
  Problem: Education: Goal: Knowledge of General Education information will improve Description: Including pain rating scale, medication(s)/side effects and non-pharmacologic comfort measures Outcome: Progressing   Problem: Activity: Goal: Risk for activity intolerance will decrease Outcome: Progressing   

## 2020-12-09 NOTE — Discharge Summary (Signed)
Patient ID: Alexander [name] MRN: [id number] DOB/AGE: April 19, 1951 70 y.o.  Admit date: 12/08/2020 Discharge date: 12/09/2020  Admission Diagnoses:  Principal Problem:   Unilateral primary osteoarthritis, left hip Active Problems:   Status post left hip replacement   Discharge Diagnoses:  Same  Past Medical History:  Diagnosis Date  . Allergy   . Cancer ([place])    skin cancer to upper chest/basal cell 2007  . Chronic neck pain    DJD  . Chronic pain in right foot   . DDD (degenerative disc disease), lumbar   . Dyslipidemia   . Erectile dysfunction   . Exogenous obesity   . H/O acquired cardiomyopathy 1996   RESOLVED (apparently was thought to have had an MI, but did not have any intervention).  He developed cardiomyopathy that forced him to retire from [employer]..  The current EF 60-65%.  . History of fracture due to fall 1988   Bilateral wrist fractures  . Hyperlipidemia   . Hypertension   . Hypogonadism male   . IBS (irritable bowel syndrome)   . Obstructive sleep apnea   . Pre-diabetes   . Sleep apnea    CPAP nightly    Surgeries: Procedure(s): LEFT TOTAL HIP ARTHROPLASTY ANTERIOR APPROACH on 12/08/2020   Consultants:   Discharged Condition: Improved  Hospital Course: Alexander [name] is an 70 y.o. male who was admitted 12/08/2020 for operative treatment ofUnilateral primary osteoarthritis, left hip. Patient has severe unremitting pain that affects sleep, daily activities, and work/hobbies. After pre-op clearance the patient was taken to the operating room on 12/08/2020 and underwent  Procedure(s): LEFT TOTAL HIP ARTHROPLASTY ANTERIOR APPROACH.    Patient was given perioperative antibiotics:  Anti-infectives (From admission, onward)   Start     Dose/Rate Route Frequency Ordered Stop   12/08/20 1400  ceFAZolin (ANCEF) IVPB 1 g/50 mL premix        1 g 100 mL/hr over 30 Minutes Intravenous Every 6 hours 12/08/20 1305 12/08/20 2303   12/08/20 0615  ceFAZolin  (ANCEF) IVPB 2g/100 mL premix        2 g 200 mL/hr over 30 Minutes Intravenous On call to O.R. 12/08/20 3716 12/08/20 0854       Patient was given sequential compression devices, early ambulation, and chemoprophylaxis to prevent DVT.  Patient benefited maximally from hospital stay and there were no complications.    Recent vital signs:  Patient Vitals for the past 24 hrs:  BP Temp Temp src Pulse Resp SpO2 Height Weight  12/09/20 1012 121/76 98 F (36.7 C) Oral (!) 59 18 98 % -- --  12/09/20 0608 (!) 142/73 98.1 F (36.7 C) Oral (!) 56 16 99 % -- --  12/09/20 0151 114/70 98.2 F (36.8 C) Oral (!) 57 14 95 % -- --  12/08/20 2133 118/70 98.2 F (36.8 C) Oral 63 16 96 % -- --  12/08/20 1955 -- -- -- 67 18 96 % -- --  12/08/20 1531 -- -- -- -- -- -- 6' (1.829 m) 111.9 kg  12/08/20 1454 128/81 98 F (36.7 C) Oral 64 20 95 % -- --  12/08/20 1230 (!) 153/97 (!) 97.5 F (36.4 C) Oral 62 12 100 % -- --  12/08/20 1215 (!) 153/102 (!) 97 F (36.1 C) -- (!) 47 11 99 % -- --     Recent laboratory studies:  Recent Labs    12/09/20 0227  WBC 10.7*  HGB 13.2  HCT 40.1  PLT 185  NA 136  K 3.8  CL 101  CO2 27  BUN 15  CREATININE 0.83  GLUCOSE 145*  CALCIUM 8.5*     Discharge Medications:   Allergies as of 12/09/2020   No Known Allergies     Medication List    STOP taking these medications   aspirin EC 81 MG tablet Replaced by: aspirin 81 MG chewable tablet     TAKE these medications   aspirin 81 MG chewable tablet Chew 1 tablet (81 mg total) by mouth 2 (two) times daily. Replaces: aspirin EC 81 MG tablet   CALCIUM 600/VITAMIN D PO Take 1 tablet by mouth daily.   carvedilol 12.5 MG tablet Commonly known as: COREG Take 1 tablet (12.5 mg total) by mouth 2 (two) times daily.   losartan-hydrochlorothiazide 100-25 MG tablet Commonly known as: HYZAAR TAKE 1 TABLET DAILY   Magnesium 250 MG Tabs Take 250 mg by mouth daily.   metFORMIN 500 MG 24 hr tablet Commonly  known as: GLUCOPHAGE-XR Take 500 mg by mouth every evening.   methocarbamol 500 MG tablet Commonly known as: ROBAXIN Take 1 tablet (500 mg total) by mouth every 6 (six) hours as needed for muscle spasms.   modafinil 200 MG tablet Commonly known as: PROVIGIL TAKE 1 TABLET DAILY (HAS APPOINTMENT 07-17-20)   multivitamin with minerals Tabs tablet Take 1 tablet by mouth daily.   oxyCODONE 5 MG immediate release tablet Commonly known as: Oxy IR/ROXICODONE Take 1-2 tablets (5-10 mg total) by mouth every 6 (six) hours as needed for moderate pain (pain score 4-6).   simvastatin 20 MG tablet Commonly known as: ZOCOR TAKE 1 TABLET DAILY What changed: when to take this   Testosterone 10 MG/ACT (2%) Gel Place 3 Pump onto the skin daily. On each inner thigh   topiramate 25 MG tablet Commonly known as: TOPAMAX Take 12.5 mg by mouth at bedtime.   vitamin B-12 1000 MCG tablet Commonly known as: CYANOCOBALAMIN Take 3,000 mcg by mouth daily.            Durable Medical Equipment  (From admission, onward)         Start     Ordered   12/08/20 1306  DME 3 n 1  Once        12/08/20 1305   12/08/20 1306  DME Walker rolling  Once       Question Answer Comment  Walker: With 5 Inch Wheels   Patient needs a walker to treat with the following condition Status post left hip replacement      12/08/20 1305          Diagnostic Studies: DG Pelvis Portable  Result Date: 12/08/2020 CLINICAL DATA:  Left total hip replacement. EXAM: PORTABLE PELVIS 1-2 VIEWS COMPARISON:  None. FINDINGS: The left femoral and acetabular components are well situated. Expected postoperative changes are seen in the surrounding soft tissues. IMPRESSION: Status post left total hip arthroplasty. Electronically Signed   By: Marijo Conception M.D.   On: 12/08/2020 13:24   DG C-Arm 1-60 Min-No Report  Result Date: 12/08/2020 Fluoroscopy was utilized by the requesting physician.  No radiographic interpretation.   DG  HIP OPERATIVE UNILAT W OR W/O PELVIS LEFT  Result Date: 12/08/2020 CLINICAL DATA:  Left hip replacement. EXAM: OPERATIVE left HIP (WITH PELVIS IF PERFORMED) 3 VIEWS TECHNIQUE: Fluoroscopic spot image(s) were submitted for interpretation post-operatively. Radiation exposure index: 3.3596 mGy. COMPARISON:  October 16, 2020. FINDINGS: Three intraoperative fluoroscopic images were obtained of the left  hip. The left femoral and acetabular components appear to be well situated. IMPRESSION: Fluoroscopic guidance provided during left total hip arthroplasty. Electronically Signed   By: Marijo Conception M.D.   On: 12/08/2020 11:57    Disposition: Discharge disposition: 01-Home or Self Care          Follow-up Information    Mcarthur Rossetti, MD. Go on 12/21/2020.   Specialty: Orthopedic Surgery Why: at 1:30 pm for your 2 week appointment with Dr. Clarita Leber information: [place] [address] [phone]        [facility] [place] Follow up.   Specialty: [place] Why: agency will provide home health physical therapa Contact information: [address] [place] [address] [phone]                Signed: Mcarthur Rossetti 12/09/2020, 11:31 AM

## 2020-12-09 NOTE — Progress Notes (Signed)
The patient is alert and oriented and has been seen by his physician. The orders for discharge were written. IV has been removed. Went over discharge instructions with patient and family. He is being discharged via wheelchair with all of his belongings.  

## 2020-12-09 NOTE — Progress Notes (Signed)
Subjective: 1 Day Post-Op Procedure(s) (LRB): LEFT TOTAL HIP ARTHROPLASTY ANTERIOR APPROACH (Left) Patient reports pain as moderate.    Objective: Vital signs in last 24 hours: Temp:  [97 F (36.1 C)-98.2 F (36.8 C)] 98 F (36.7 C) (04/23 1012) Pulse Rate:  [47-67] 59 (04/23 1012) Resp:  [11-20] 18 (04/23 1012) BP: (114-153)/(70-102) 121/76 (04/23 1012) SpO2:  [95 %-100 %] 98 % (04/23 1012) Weight:  [111.9 kg] 111.9 kg (04/22 1531)  Intake/Output from previous day: 04/22 0701 - 04/23 0700 In: 2705 [P.O.:955; I.V.:1500; IV Piggyback:250] Out: 4410 [Urine:4060; Blood:350] Intake/Output this shift: Total I/O In: 874.1 [I.V.:874.1] Out: -   Recent Labs    12/09/20 0227  HGB 13.2   Recent Labs    12/09/20 0227  WBC 10.7*  RBC 4.23  HCT 40.1  PLT 185   Recent Labs    12/09/20 0227  NA 136  K 3.8  CL 101  CO2 27  BUN 15  CREATININE 0.83  GLUCOSE 145*  CALCIUM 8.5*   No results for input(s): LABPT, INR in the last 72 hours.  Sensation intact distally Intact pulses distally Dorsiflexion/Plantar flexion intact Incision: scant drainage   Assessment/Plan: 1 Day Post-Op Procedure(s) (LRB): LEFT TOTAL HIP ARTHROPLASTY ANTERIOR APPROACH (Left) Up with therapy Discharge home with home health      Mcarthur Rossetti 12/09/2020, 11:29 AM

## 2020-12-09 NOTE — Progress Notes (Signed)
Foley catheter removed per order. Pt tolerated well, peri-care completed.

## 2020-12-09 NOTE — Discharge Instructions (Signed)

## 2020-12-09 NOTE — Progress Notes (Signed)
Physical Therapy Treatment Patient Details Name: Alexander [name] MRN: [id number] DOB: 11/24/1950 Today's Date: 12/09/2020    History of Present Illness Patient is 70 y.o. male s/p Lt THA anterior approach on 12/09/19 with PMH significant for OA, HTN, HLD, neck pain, OSA, CTR bil.    PT Comments    Progressing with mobility. Reviewed/practiced gait and stair training. Wife present during session to observe. Pt reported increased pain end of session-made RN aware. All education completed. Okay to d/c from PT standpoint.    Follow Up Recommendations  Follow surgeon's recommendation for DC plan and follow-up therapies (HHPT)     Equipment Recommendations  None recommended by PT    Recommendations for Other Services       Precautions / Restrictions Precautions Precautions: Fall Restrictions Weight Bearing Restrictions: No LLE Weight Bearing: Weight bearing as tolerated Other Position/Activity Restrictions: WBAT    Mobility  Bed Mobility Overal bed mobility: Needs Assistance Bed Mobility: Supine to Sit     Supine to sit: Min assist    General bed mobility comments: A for L LE. Increased time. Cues for safety, technique.    Transfers Overall transfer level: Needs assistance Equipment used: Rolling walker (2 wheeled) Transfers: Sit to/from Stand Sit to Stand: Min guard         General transfer comment: Min guard for safety. Cues for safety, technique, hand/LE placement. Pt reported increased pain after sitting into recliner without extending L LE  Ambulation/Gait Ambulation/Gait assistance: Min guard Gait Distance (Feet): 100 Feet Assistive device: Rolling walker (2 wheeled) Gait Pattern/deviations: Step-to pattern     General Gait Details: Cues for safety, technique, sequence. Slow but steady gait with RW   Stairs Stairs: Yes Stairs assistance: Min guard Stair Management: Step to pattern;Forwards;One rail Right;With cane Number of Stairs: 2 General stair  comments: Cues for safety, technique, sequence. Min guard for safety. Wife present to observe.   Wheelchair Mobility    Modified Rankin (Stroke Patients Only)       Balance Overall balance assessment: Needs assistance         Standing balance support: Bilateral upper extremity supported Standing balance-Leahy Scale: Fair                              Cognition Arousal/Alertness: Awake/alert Behavior During Therapy: WFL for tasks assessed/performed Overall Cognitive Status: Within Functional Limits for tasks assessed                                        Exercises     General Comments        Pertinent Vitals/Pain Pain Assessment: 0-10 Pain Score: 8  Pain Location: L hip/thigh Pain Descriptors / Indicators: Discomfort;Sore Pain Intervention(s): Monitored during session;Patient requesting pain meds-RN notified;Repositioned;Ice applied    Home Living                      Prior Function            PT Goals (current goals can now be found in the care plan section) Progress towards PT goals: Progressing toward goals    Frequency    7X/week      PT Plan Current plan remains appropriate    Co-evaluation              AM-PAC PT "6 Clicks" Mobility  Outcome Measure  Help needed turning from your back to your side while in a flat bed without using bedrails?: A Little Help needed moving from lying on your back to sitting on the side of a flat bed without using bedrails?: A Little Help needed moving to and from a bed to a chair (including a wheelchair)?: A Little Help needed standing up from a chair using your arms (e.g., wheelchair or bedside chair)?: A Little Help needed to walk in hospital room?: A Little Help needed climbing 3-5 steps with a railing? : A Little 6 Click Score: 18    End of Session Equipment Utilized During Treatment: Gait belt Activity Tolerance: Patient tolerated treatment well (increased pain  at end of session-RN to give pain meds) Patient left: in chair;with call bell/phone within reach;with family/visitor present   PT Visit Diagnosis: Other abnormalities of gait and mobility (R26.89);Pain Pain - Right/Left: Left Pain - part of body: Hip     Time: 1349-1406 PT Time Calculation (min) (ACUTE ONLY): 17 min  Charges:  $Gait Training: 8-22 mins $Therapeutic Exercise: 8-22 mins                         Doreatha Massed, PT Acute Rehabilitation  Office: [phone] Pager: [phone]

## 2020-12-09 NOTE — TOC Progression Note (Signed)
Transition of Care [facility]) - Progression Note    Patient Details  Name: Alexander [name] MRN: [id number] Date of Birth: 02/11/1951  Transition of Care [facility]) CM/SW Contact  Joaquin Courts, RN Phone Number: 12/09/2020, 11:23 AM  Clinical Narrative:    Patient plans to dc home with HHPT, [facility] to provide services.  Patient reports has rolling walker and 3in1 at home.   Expected Discharge Plan: [place] Barriers to Discharge: No Barriers Identified  Expected Discharge Plan and Services Expected Discharge Plan: [place]   Discharge Planning Services: CM Consult Post Acute Care Choice: [place] arrangements for the past 2 months: Single Family Home                 DME Arranged: N/A DME Agency: NA       HH Arranged: PT HH Agency: [facility] [employer]     Representative spoke with at [place]: pre arranged in md office   Social Determinants of Health (SDOH) Interventions    Readmission Risk Interventions No flowsheet data found.

## 2020-12-09 NOTE — Progress Notes (Signed)
Physical Therapy Treatment Patient Details Name: Alexander [name] MRN: [id number] DOB: 03/26/1951 Today's Date: 12/09/2020    History of Present Illness Patient is 70 y.o. male s/p Lt THA anterior approach on 12/09/19 with PMH significant for OA, HTN, HLD, neck pain, OSA, CTR bil.    PT Comments    Progressing with mobility. Reviewed/practiced exercises, gait training, and stair negotiation. Will plan to have a 2nd session prior to d/c home later today.    Follow Up Recommendations  Follow surgeon's recommendation for DC plan and follow-up therapies (HHPT)     Equipment Recommendations  None recommended by PT    Recommendations for Other Services       Precautions / Restrictions Precautions Precautions: Fall Restrictions Weight Bearing Restrictions: No LLE Weight Bearing: Weight bearing as tolerated Other Position/Activity Restrictions: WBAT    Mobility  Bed Mobility Overal bed mobility: Needs Assistance Bed Mobility: Sit to Supine       Sit to supine: Min assist   General bed mobility comments: A for L LE. Increased time. Cues for safety, technique.    Transfers Overall transfer level: Needs assistance Equipment used: Rolling walker (2 wheeled) Transfers: Sit to/from Stand Sit to Stand: Min guard         General transfer comment: Min guard for safety. Cues for safety, technique, hand/LE placement.  Ambulation/Gait Ambulation/Gait assistance: Min guard Gait Distance (Feet): 125 Feet Assistive device: Rolling walker (2 wheeled) Gait Pattern/deviations: Step-to pattern     General Gait Details: Cues for safety, technique, sequence. Pt c/o mild lightheadedness after practing stairs but he was able to safely continue back to the room.   Stairs Stairs: Yes Stairs assistance: Min assist Stair Management: Step to pattern;Forwards;One rail Right;With cane Number of Stairs: 2 General stair comments: Cues for safety, technique, sequence. Assist to  steady.   Wheelchair Mobility    Modified Rankin (Stroke Patients Only)       Balance Overall balance assessment: Needs assistance         Standing balance support: Bilateral upper extremity supported Standing balance-Leahy Scale: Poor                              Cognition Arousal/Alertness: Awake/alert Behavior During Therapy: WFL for tasks assessed/performed Overall Cognitive Status: Within Functional Limits for tasks assessed                                        Exercises Total Joint Exercises Ankle Circles/Pumps: AROM;Both;10 reps Quad Sets: AROM;Both;10 reps Heel Slides: AAROM;Left;10 reps Hip ABduction/ADduction: AAROM;Left;10 reps    General Comments        Pertinent Vitals/Pain Pain Assessment: 0-10 Pain Score: 7  Pain Location: L hip/thigh Pain Descriptors / Indicators: Discomfort;Sore Pain Intervention(s): Limited activity within patient's tolerance;Monitored during session;Ice applied;Repositioned    Home Living                      Prior Function            PT Goals (current goals can now be found in the care plan section) Progress towards PT goals: Progressing toward goals    Frequency    7X/week      PT Plan Current plan remains appropriate    Co-evaluation              AM-PAC  PT "6 Clicks" Mobility   Outcome Measure  Help needed turning from your back to your side while in a flat bed without using bedrails?: A Little Help needed moving from lying on your back to sitting on the side of a flat bed without using bedrails?: A Little Help needed moving to and from a bed to a chair (including a wheelchair)?: A Little Help needed standing up from a chair using your arms (e.g., wheelchair or bedside chair)?: A Little Help needed to walk in hospital room?: A Little Help needed climbing 3-5 steps with a railing? : A Little 6 Click Score: 18    End of Session Equipment Utilized During  Treatment: Gait belt Activity Tolerance: Patient tolerated treatment well Patient left: in bed;with call bell/phone within reach;with family/visitor present   PT Visit Diagnosis: Other abnormalities of gait and mobility (R26.89);Pain Pain - Right/Left: Left Pain - part of body: Hip     Time: 5188-4166 PT Time Calculation (min) (ACUTE ONLY): 36 min  Charges:  $Gait Training: 8-22 mins $Therapeutic Exercise: 8-22 mins                        Doreatha Massed, PT Acute Rehabilitation  Office: [phone] Pager: [phone]

## 2020-12-10 DIAGNOSIS — K589 Irritable bowel syndrome without diarrhea: Secondary | ICD-10-CM | POA: Diagnosis not present

## 2020-12-10 DIAGNOSIS — G8929 Other chronic pain: Secondary | ICD-10-CM | POA: Diagnosis not present

## 2020-12-10 DIAGNOSIS — Z87891 Personal history of nicotine dependence: Secondary | ICD-10-CM | POA: Diagnosis not present

## 2020-12-10 DIAGNOSIS — M5136 Other intervertebral disc degeneration, lumbar region: Secondary | ICD-10-CM | POA: Diagnosis not present

## 2020-12-10 DIAGNOSIS — G4710 Hypersomnia, unspecified: Secondary | ICD-10-CM | POA: Diagnosis not present

## 2020-12-10 DIAGNOSIS — Z471 Aftercare following joint replacement surgery: Secondary | ICD-10-CM | POA: Diagnosis not present

## 2020-12-10 DIAGNOSIS — G4733 Obstructive sleep apnea (adult) (pediatric): Secondary | ICD-10-CM | POA: Diagnosis not present

## 2020-12-10 DIAGNOSIS — Z96642 Presence of left artificial hip joint: Secondary | ICD-10-CM | POA: Diagnosis not present

## 2020-12-10 DIAGNOSIS — Z7984 Long term (current) use of oral hypoglycemic drugs: Secondary | ICD-10-CM | POA: Diagnosis not present

## 2020-12-10 DIAGNOSIS — Z85828 Personal history of other malignant neoplasm of skin: Secondary | ICD-10-CM | POA: Diagnosis not present

## 2020-12-10 DIAGNOSIS — R7303 Prediabetes: Secondary | ICD-10-CM | POA: Diagnosis not present

## 2020-12-10 DIAGNOSIS — M542 Cervicalgia: Secondary | ICD-10-CM | POA: Diagnosis not present

## 2020-12-10 DIAGNOSIS — I10 Essential (primary) hypertension: Secondary | ICD-10-CM | POA: Diagnosis not present

## 2020-12-10 DIAGNOSIS — E7800 Pure hypercholesterolemia, unspecified: Secondary | ICD-10-CM | POA: Diagnosis not present

## 2020-12-10 DIAGNOSIS — M1611 Unilateral primary osteoarthritis, right hip: Secondary | ICD-10-CM | POA: Diagnosis not present

## 2020-12-10 DIAGNOSIS — Z6833 Body mass index (BMI) 33.0-33.9, adult: Secondary | ICD-10-CM | POA: Diagnosis not present

## 2020-12-11 ENCOUNTER — Telehealth: Payer: Self-pay | Admitting: *Deleted

## 2020-12-11 ENCOUNTER — Encounter (HOSPITAL_COMMUNITY): Payer: Self-pay | Admitting: Orthopaedic Surgery

## 2020-12-11 DIAGNOSIS — Z471 Aftercare following joint replacement surgery: Secondary | ICD-10-CM | POA: Diagnosis not present

## 2020-12-11 DIAGNOSIS — I10 Essential (primary) hypertension: Secondary | ICD-10-CM | POA: Diagnosis not present

## 2020-12-11 DIAGNOSIS — G4710 Hypersomnia, unspecified: Secondary | ICD-10-CM | POA: Diagnosis not present

## 2020-12-11 DIAGNOSIS — M1611 Unilateral primary osteoarthritis, right hip: Secondary | ICD-10-CM | POA: Diagnosis not present

## 2020-12-11 DIAGNOSIS — G4733 Obstructive sleep apnea (adult) (pediatric): Secondary | ICD-10-CM | POA: Diagnosis not present

## 2020-12-11 DIAGNOSIS — E7800 Pure hypercholesterolemia, unspecified: Secondary | ICD-10-CM | POA: Diagnosis not present

## 2020-12-11 NOTE — Telephone Encounter (Signed)
Ortho bundle D/C call completed. 

## 2020-12-13 DIAGNOSIS — G4710 Hypersomnia, unspecified: Secondary | ICD-10-CM | POA: Diagnosis not present

## 2020-12-13 DIAGNOSIS — I10 Essential (primary) hypertension: Secondary | ICD-10-CM | POA: Diagnosis not present

## 2020-12-13 DIAGNOSIS — G4733 Obstructive sleep apnea (adult) (pediatric): Secondary | ICD-10-CM | POA: Diagnosis not present

## 2020-12-13 DIAGNOSIS — Z471 Aftercare following joint replacement surgery: Secondary | ICD-10-CM | POA: Diagnosis not present

## 2020-12-13 DIAGNOSIS — M1611 Unilateral primary osteoarthritis, right hip: Secondary | ICD-10-CM | POA: Diagnosis not present

## 2020-12-13 DIAGNOSIS — E7800 Pure hypercholesterolemia, unspecified: Secondary | ICD-10-CM | POA: Diagnosis not present

## 2020-12-14 ENCOUNTER — Telehealth: Payer: Self-pay | Admitting: *Deleted

## 2020-12-14 DIAGNOSIS — E7800 Pure hypercholesterolemia, unspecified: Secondary | ICD-10-CM | POA: Diagnosis not present

## 2020-12-14 DIAGNOSIS — G4710 Hypersomnia, unspecified: Secondary | ICD-10-CM | POA: Diagnosis not present

## 2020-12-14 DIAGNOSIS — I10 Essential (primary) hypertension: Secondary | ICD-10-CM | POA: Diagnosis not present

## 2020-12-14 DIAGNOSIS — G4733 Obstructive sleep apnea (adult) (pediatric): Secondary | ICD-10-CM | POA: Diagnosis not present

## 2020-12-14 DIAGNOSIS — Z471 Aftercare following joint replacement surgery: Secondary | ICD-10-CM | POA: Diagnosis not present

## 2020-12-14 DIAGNOSIS — M1611 Unilateral primary osteoarthritis, right hip: Secondary | ICD-10-CM | POA: Diagnosis not present

## 2020-12-14 MED ORDER — OXYCODONE HCL 5 MG PO TABS: 5.0000 mg | ORAL_TABLET | Freq: Four times a day (QID) | ORAL | 0 refills | Status: DC | PRN

## 2020-12-14 NOTE — Telephone Encounter (Signed)
Call to patient and updated on refill of pain medication.

## 2020-12-14 NOTE — Telephone Encounter (Signed)
I sent some in 

## 2020-12-14 NOTE — Telephone Encounter (Signed)
Ortho bundle call. Patient doing very well. Is requesting refill of pain medication before the weekend. Thanks.

## 2020-12-18 DIAGNOSIS — G4710 Hypersomnia, unspecified: Secondary | ICD-10-CM | POA: Diagnosis not present

## 2020-12-18 DIAGNOSIS — I10 Essential (primary) hypertension: Secondary | ICD-10-CM | POA: Diagnosis not present

## 2020-12-18 DIAGNOSIS — E7800 Pure hypercholesterolemia, unspecified: Secondary | ICD-10-CM | POA: Diagnosis not present

## 2020-12-18 DIAGNOSIS — G4733 Obstructive sleep apnea (adult) (pediatric): Secondary | ICD-10-CM | POA: Diagnosis not present

## 2020-12-18 DIAGNOSIS — M1611 Unilateral primary osteoarthritis, right hip: Secondary | ICD-10-CM | POA: Diagnosis not present

## 2020-12-18 DIAGNOSIS — Z471 Aftercare following joint replacement surgery: Secondary | ICD-10-CM | POA: Diagnosis not present

## 2020-12-21 ENCOUNTER — Ambulatory Visit (INDEPENDENT_AMBULATORY_CARE_PROVIDER_SITE_OTHER): Payer: Medicare Other | Admitting: Orthopaedic Surgery

## 2020-12-21 ENCOUNTER — Encounter: Payer: Self-pay | Admitting: Orthopaedic Surgery

## 2020-12-21 DIAGNOSIS — I10 Essential (primary) hypertension: Secondary | ICD-10-CM | POA: Diagnosis not present

## 2020-12-21 DIAGNOSIS — G4733 Obstructive sleep apnea (adult) (pediatric): Secondary | ICD-10-CM | POA: Diagnosis not present

## 2020-12-21 DIAGNOSIS — Z96642 Presence of left artificial hip joint: Secondary | ICD-10-CM

## 2020-12-21 DIAGNOSIS — G4710 Hypersomnia, unspecified: Secondary | ICD-10-CM | POA: Diagnosis not present

## 2020-12-21 DIAGNOSIS — E7800 Pure hypercholesterolemia, unspecified: Secondary | ICD-10-CM | POA: Diagnosis not present

## 2020-12-21 DIAGNOSIS — M1611 Unilateral primary osteoarthritis, right hip: Secondary | ICD-10-CM | POA: Diagnosis not present

## 2020-12-21 DIAGNOSIS — Z471 Aftercare following joint replacement surgery: Secondary | ICD-10-CM | POA: Diagnosis not present

## 2020-12-21 NOTE — Progress Notes (Signed)
The patient is 2 weeks tomorrow status post a left total hip arthroplasty.  He is doing well.  He does state that home health is helped him greatly with his mobility.  He is walking with a cane.  He did does report some lateral leg pain but denies any calf pain.  He denies any foot ankle swelling.  The staples were removed from his left hip incision and Steri-Strips are in place.  The incision looks good except for a little bit of irritation at the very top from his groin crease.  There is no significant seroma.  His leg lengths are equal.  He will continue to slowly increase his activities as comfort allows.  He can go down to his regular baby aspirin daily.  All questions and concerns were answered and addressed.  I will see him back in 4 weeks for follow-up.

## 2020-12-22 ENCOUNTER — Other Ambulatory Visit: Payer: Self-pay | Admitting: Orthopaedic Surgery

## 2020-12-22 ENCOUNTER — Telehealth: Payer: Self-pay | Admitting: Orthopaedic Surgery

## 2020-12-22 MED ORDER — OXYCODONE HCL 5 MG PO TABS: 5.0000 mg | ORAL_TABLET | Freq: Four times a day (QID) | ORAL | 0 refills | Status: DC | PRN

## 2020-12-22 NOTE — Telephone Encounter (Signed)
Pt would like a refill on his oxycodone! 

## 2020-12-25 MED ORDER — OXYCODONE HCL 5 MG PO TABS: 5.0000 mg | ORAL_TABLET | Freq: Four times a day (QID) | ORAL | 0 refills | Status: DC | PRN

## 2020-12-26 ENCOUNTER — Telehealth: Payer: Self-pay | Admitting: *Deleted

## 2020-12-26 NOTE — Telephone Encounter (Signed)
Ortho bundle call. Patient states he was doing home exercises given by therapist (specifically going up on tip toes) and must have done too much, because he is now feeling calf pain and shin splints. Calf pain we discussed and this has resolved along with the calf is not tender. He asked about voltaren gel that you discussed and he will get some and use. I recommended ice for the shin splint pain and not doing that exercise anymore. He also wanted to make sure it was ok to get on a recumbent bike at this time. Thanks.

## 2020-12-26 NOTE — Telephone Encounter (Signed)
I am fine with him using Voltaren Gel and I'm fine with him using a recumbent bike.

## 2020-12-27 NOTE — Telephone Encounter (Signed)
Call to patient and updated. 

## 2020-12-28 ENCOUNTER — Other Ambulatory Visit: Payer: Self-pay | Admitting: *Deleted

## 2020-12-28 ENCOUNTER — Telehealth: Payer: Self-pay | Admitting: *Deleted

## 2020-12-28 DIAGNOSIS — Z96642 Presence of left artificial hip joint: Secondary | ICD-10-CM

## 2020-12-28 NOTE — Telephone Encounter (Signed)
Go ahead and definitely order the Doppler to rule out DVT.  Thank you

## 2020-12-28 NOTE — Telephone Encounter (Signed)
Order for lower extremity doppler in chart.

## 2020-12-28 NOTE — Telephone Encounter (Signed)
Call to patient today and he states he is having increased calf pain over the last day or so. Thought it was more in the shins after doing therapy exercises, but calf pain has returned and is very painful today. I can order doppler if ok. Thanks.

## 2020-12-29 ENCOUNTER — Other Ambulatory Visit: Payer: Self-pay

## 2020-12-29 ENCOUNTER — Ambulatory Visit (HOSPITAL_COMMUNITY)
Admission: RE | Admit: 2020-12-29 | Discharge: 2020-12-29 | Disposition: A | Discharge status: Home / Self Care | Payer: Medicare Other | Source: Ambulatory Visit | Attending: Orthopaedic Surgery | Admitting: Orthopaedic Surgery

## 2020-12-29 DIAGNOSIS — Z96642 Presence of left artificial hip joint: Secondary | ICD-10-CM | POA: Insufficient documentation

## 2021-01-08 ENCOUNTER — Telehealth: Payer: Self-pay

## 2021-01-08 NOTE — Telephone Encounter (Signed)
Patient left voice mail on Friday wanting return call.  Not sure if you have spoken with him since.  Thanks!

## 2021-01-09 ENCOUNTER — Telehealth: Payer: Self-pay | Admitting: *Deleted

## 2021-01-09 NOTE — Telephone Encounter (Signed)
Ortho bundle 30 day call to patient. Sherrie-he did want to speak with you about scheduling his right hip for August some time. I told him to discuss with Dr. [name] at 01/18/21 appointment. Thanks.

## 2021-01-09 NOTE — Telephone Encounter (Signed)
I have not. Thanks- I'll call him.

## 2021-01-18 ENCOUNTER — Encounter: Payer: Self-pay | Admitting: Orthopaedic Surgery

## 2021-01-18 ENCOUNTER — Telehealth: Payer: Self-pay | Admitting: *Deleted

## 2021-01-18 ENCOUNTER — Ambulatory Visit (INDEPENDENT_AMBULATORY_CARE_PROVIDER_SITE_OTHER): Payer: Medicare Other | Admitting: Orthopaedic Surgery

## 2021-01-18 DIAGNOSIS — Z96642 Presence of left artificial hip joint: Secondary | ICD-10-CM

## 2021-01-18 DIAGNOSIS — M1611 Unilateral primary osteoarthritis, right hip: Secondary | ICD-10-CM

## 2021-01-18 NOTE — Telephone Encounter (Signed)
Ortho bundle 6 week post op in office meeting. Pt doing well and wants to proceed with scheduling of his other hip for replacement sometime in August.

## 2021-01-18 NOTE — Progress Notes (Signed)
The patient is now 6 weeks status post a left total hip arthroplasty.  The left hip is doing great.  He has known severe arthritis in his right hip and wants to proceed with a right hip replacement later this year which I agree with.  He is walking without assist device.  He does still have some lateral leg pain that is more painful in the morning.  The DVT screen was negative.  He is walking without a limp either.  His left total hip arthroplasty moves smoothly and fluidly.  His right hip has pain with internal ex rotation as well as significant stiffness with rotation.  Previous x-rays of his right hip do confirm end-stage arthritis of the right hip.  He will continue increase his activities as he tolerates.  We will see him back in 4 weeks for repeat exam but no x-rays are needed.  At that point we will see about scheduling him for August of a right total hip arthroplasty to treat his right hip osteoarthritis.

## 2021-02-15 ENCOUNTER — Encounter: Payer: Self-pay | Admitting: Orthopaedic Surgery

## 2021-02-15 ENCOUNTER — Ambulatory Visit (INDEPENDENT_AMBULATORY_CARE_PROVIDER_SITE_OTHER): Payer: Medicare Other | Admitting: Orthopaedic Surgery

## 2021-02-15 DIAGNOSIS — M1611 Unilateral primary osteoarthritis, right hip: Secondary | ICD-10-CM

## 2021-02-15 DIAGNOSIS — Z96642 Presence of left artificial hip joint: Secondary | ICD-10-CM

## 2021-02-15 NOTE — Progress Notes (Signed)
The patient is well-known to me.  He has debilitating arthritis of both his hips.  We replaced his left hip in April of this year.  We have him scheduled for a right total hip arthroplasty August 12.  He is doing well overall.  He is having some numbness at the incision site and I let him know that is normal.  His leg lengths are equal.  He does have a history of a quad tendon repair on the right side.  This should not affect his right total hip arthroplasty.  He will continue increase his activities as comfort allows.  We will see him at the time of surgery for his right total hip on August 12.

## 2021-02-21 ENCOUNTER — Other Ambulatory Visit: Payer: Self-pay

## 2021-03-16 NOTE — Progress Notes (Addendum)
COVID Vaccine Completed: yes x3 Date COVID Vaccine completed: 10/14/19, 11/09/19 Has received booster: 08/28/20 COVID vaccine manufacturer: [place]      Date of COVID positive in last 90 days: No  PCP - Leanna Battles, MD Cardiologist - Glenetta Hew, MD  Chest x-ray - N/a EKG - 03/19/21 Epic Stress Test - years ago ECHO - 06/26/17 Epic Cardiac Cath - years ago Pacemaker/ICD device last checked:N/a Spinal Cord Stimulator: N/a  Sleep Study - yes positive sleep apnea CPAP - yes every night  Fasting Blood Sugar - doesn't check sugars at home Checks Blood Sugar _____ times a day  Blood Thinner Instructions: instructed to call and ask for instructions Aspirin Instructions: Last Dose:  Activity level: Can go up a flight of stairs and perform activities of daily living without stopping and without symptoms of chest pain or shortness of breath.      Anesthesia review: HTN, pre DM, OSA  Patient denies shortness of breath, fever, cough and chest pain at PAT appointment   Patient verbalized understanding of instructions that were given to them at the PAT appointment. Patient was also instructed that they will need to review over the PAT instructions again at home before surgery.

## 2021-03-16 NOTE — Progress Notes (Signed)
Please place orders for PAT appointment scheduled 03/19/21.

## 2021-03-16 NOTE — Patient Instructions (Addendum)
DUE TO COVID-19 ONLY ONE VISITOR IS ALLOWED TO COME WITH YOU AND STAY IN THE WAITING ROOM ONLY DURING PRE OP AND PROCEDURE.   **NO VISITORS ARE ALLOWED IN THE SHORT STAY AREA OR RECOVERY ROOM!!**  IF YOU WILL BE ADMITTED INTO THE HOSPITAL YOU ARE ALLOWED ONLY TWO SUPPORT PEOPLE DURING VISITATION HOURS ONLY (10AM -8PM)   The support person(s) may change daily. The support person(s) must pass our screening, gel in and out, and wear a mask at all times, including in the patient's room. Patients must also wear a mask when staff or their support person are in the room.  No visitors under the age of 44. Any visitor under the age of 59 must be accompanied by an adult.    COVID SWAB TESTING MUST BE COMPLETED ON:  03/28/21   [place] [place] (backside of the building). Open 8AM-3PM. No appointment needed. You are not required to quarantine, however you are required to wear a well-fitted mask when you are out and around people not in your household.  Hand Hygiene often Do NOT share personal items Notify your provider if you are in close contact with someone who has COVID or you develop fever 100.4 or greater, new onset of sneezing, cough, sore throat, shortness of breath or body aches.   Your procedure is scheduled on: 03/30/21   Report to [facility] Main  Entrance    Report to admitting at 6:00 AM   Call this number if you have problems the morning of surgery [phone]   Do not eat food :After Midnight.   May have liquids until  5:30 AM  day of surgery  CLEAR LIQUID DIET  Foods Allowed                                                                     Foods Excluded  Water, Black Coffee and tea, regular and decaf               liquids that you cannot  Plain Jell-O in any flavor  (No red)                                     see through such as: Fruit ices (not with fruit pulp)                                             milk, soups, orange juice               Iced Popsicles (No red)                                                 All solid food                                   Apple juices  Sports drinks like Gatorade (No red) Lightly seasoned clear broth or consume(fat free) Sugar, honey syrup     The day of surgery:  Drink ONE (1) Pre-Surgery G2 by 5:30 am the morning of surgery. Drink in one sitting. Do not sip.  This drink was given to you during your hospital  pre-op appointment visit. Nothing else to drink after completing the  Pre-Surgery G2.          If you have questions, please contact your surgeon's office.     Oral Hygiene is also important to reduce your risk of infection.                                    Remember - BRUSH YOUR TEETH THE MORNING OF SURGERY WITH YOUR REGULAR TOOTHPASTE    Take these medicines the morning of surgery with A SIP OF WATER: Carvedilol, Oxycodone.  DO NOT TAKE ANY ORAL DIABETIC MEDICATIONS DAY OF YOUR SURGERY  How to Manage Your Diabetes Before and After Surgery  Why is it important to control my blood sugar before and after surgery? Improving blood sugar levels before and after surgery helps healing and can limit problems. A way of improving blood sugar control is eating a healthy diet by:  Eating less sugar and carbohydrates  Increasing activity/exercise  Talking with your doctor about reaching your blood sugar goals High blood sugars (greater than 180 mg/dL) can raise your risk of infections and slow your recovery, so you will need to focus on controlling your diabetes during the weeks before surgery. Make sure that the doctor who takes care of your diabetes knows about your planned surgery including the date and location.  How do I manage my blood sugar before surgery? Check your blood sugar at least 4 times a day, starting 2 days before surgery, to make sure that the level is not too high or low. Check your blood sugar the morning of your surgery when you wake up and every 2 hours until  you get to the Short Stay unit. If your blood sugar is less than 70 mg/dL, you will need to treat for low blood sugar: Do not take insulin. Treat a low blood sugar (less than 70 mg/dL) with  cup of clear juice (cranberry or apple), 4 glucose tablets, OR glucose gel. Recheck blood sugar in 15 minutes after treatment (to make sure it is greater than 70 mg/dL). If your blood sugar is not greater than 70 mg/dL on recheck, call [phone] for further instructions. Report your blood sugar to the short stay nurse when you get to Short Stay.  If you are admitted to the hospital after surgery: Your blood sugar will be checked by the staff and you will probably be given insulin after surgery (instead of oral diabetes medicines) to make sure you have good blood sugar levels. The goal for blood sugar control after surgery is 80-180 mg/dL.   WHAT DO I DO ABOUT MY DIABETES MEDICATION?  Do not take oral diabetes medicines (pills) the morning of surgery.  THE DAY BEFORE SURGERY, take Metformin as prescribed      THE MORNING OF SURGERY, do not take Metformin.   Reviewed and Endorsed by [facility] Patient Education Committee, August 2015  You may not have any metal on your body including hair pins, jewelry, and body piercing             Do not wear lotions, powders, cologne, or deodorant              Men may shave face and neck.   Do not bring valuables to the hospital. [place].   Contacts, dentures or bridgework may not be worn into surgery.   Bring small overnight bag day of surgery.  Please bring CPAP tubing and mask.   Please read over the following fact sheets you were given: IF YOU HAVE QUESTIONS ABOUT YOUR PRE OP INSTRUCTIONS PLEASE CALL [phone]- [facility] - Preparing for Surgery Before surgery, you can play an important role.  Because skin is not sterile, your skin needs to be as free of  germs as possible.  You can reduce the number of germs on your skin by washing with CHG (chlorahexidine gluconate) soap before surgery.  CHG is an antiseptic cleaner which kills germs and bonds with the skin to continue killing germs even after washing. Please DO NOT use if you have an allergy to CHG or antibacterial soaps.  If your skin becomes reddened/irritated stop using the CHG and inform your nurse when you arrive at Short Stay. Do not shave (including legs and underarms) for at least 48 hours prior to the first CHG shower.  You may shave your face/neck.  Please follow these instructions carefully:  1.  Shower with CHG Soap the night before surgery and the  morning of surgery.  2.  If you choose to wash your hair, wash your hair first as usual with your normal  shampoo.  3.  After you shampoo, rinse your hair and body thoroughly to remove the shampoo.                             4.  Use CHG as you would any other liquid soap.  You can apply chg directly to the skin and wash.  Gently with a scrungie or clean washcloth.  5.  Apply the CHG Soap to your body ONLY FROM THE NECK DOWN.   Do   not use on face/ open                           Wound or open sores. Avoid contact with eyes, ears mouth and   genitals (private parts).                       Wash face,  Genitals (private parts) with your normal soap.             6.  Wash thoroughly, paying special attention to the area where your    surgery  will be performed.  7.  Thoroughly rinse your body with warm water from the neck down.  8.  DO NOT shower/wash with your normal soap after using and rinsing off the CHG Soap.                9.  Pat yourself dry with a clean towel.            10.  Wear clean pajamas.  11.  Place clean sheets on your bed the night of your first shower and do not  sleep with pets. Day of Surgery : Do not apply any lotions/deodorants the morning of surgery.  Please wear clean clothes to the hospital/surgery  center.  FAILURE TO FOLLOW THESE INSTRUCTIONS MAY RESULT IN THE CANCELLATION OF YOUR SURGERY  PATIENT SIGNATURE_________________________________  NURSE SIGNATURE__________________________________  ________________________________________________________________________  WHAT IS A BLOOD TRANSFUSION? Blood Transfusion Information  A transfusion is the replacement of blood or some of its parts. Blood is made up of multiple cells which provide different functions. Red blood cells carry oxygen and are used for blood loss replacement. White blood cells fight against infection. Platelets control bleeding. Plasma helps clot blood. Other blood products are available for specialized needs, such as hemophilia or other clotting disorders. BEFORE THE TRANSFUSION  Who gives blood for transfusions?  Healthy volunteers who are fully evaluated to make sure their blood is safe. This is blood bank blood. Transfusion therapy is the safest it has ever been in the practice of medicine. Before blood is taken from a donor, a complete history is taken to make sure that person has no history of diseases nor engages in risky social behavior (examples are intravenous drug use or sexual activity with multiple partners). The donor's travel history is screened to minimize risk of transmitting infections, such as malaria. The donated blood is tested for signs of infectious diseases, such as HIV and hepatitis. The blood is then tested to be sure it is compatible with you in order to minimize the chance of a transfusion reaction. If you or a relative donates blood, this is often done in anticipation of surgery and is not appropriate for emergency situations. It takes many days to process the donated blood. RISKS AND COMPLICATIONS Although transfusion therapy is very safe and saves many lives, the main dangers of transfusion include:  Getting an infectious disease. Developing a transfusion reaction. This is an allergic  reaction to something in the blood you were given. Every precaution is taken to prevent this. The decision to have a blood transfusion has been considered carefully by your caregiver before blood is given. Blood is not given unless the benefits outweigh the risks. AFTER THE TRANSFUSION Right after receiving a blood transfusion, you will usually feel much better and more energetic. This is especially true if your red blood cells have gotten low (anemic). The transfusion raises the level of the red blood cells which carry oxygen, and this usually causes an energy increase. The nurse administering the transfusion will monitor you carefully for complications. HOME CARE INSTRUCTIONS  No special instructions are needed after a transfusion. You may find your energy is better. Speak with your caregiver about any limitations on activity for underlying diseases you may have. SEEK MEDICAL CARE IF:  Your condition is not improving after your transfusion. You develop redness or irritation at the intravenous (IV) site. SEEK IMMEDIATE MEDICAL CARE IF:  Any of the following symptoms occur over the next 12 hours: Shaking chills. You have a temperature by mouth above 102 F (38.9 C), not controlled by medicine. Chest, back, or muscle pain. People around you feel you are not acting correctly or are confused. Shortness of breath or difficulty breathing. Dizziness and fainting. You get a rash or develop hives. You have a decrease in urine output. Your urine turns a dark color or changes to pink, red, or brown. Any of the following symptoms occur over the next 10 days: You have  a temperature by mouth above 102 F (38.9 C), not controlled by medicine. Shortness of breath. Weakness after normal activity. The white part of the eye turns yellow (jaundice). You have a decrease in the amount of urine or are urinating less often. Your urine turns a dark color or changes to pink, red, or brown. Document Released:  08/02/2000 Document Revised: 10/28/2011 Document Reviewed: 03/21/2008 Exit[employer] Patient Information 2014 [place].  _______________________________________________________________________     Incentive Spirometer  An incentive spirometer is a tool that can help keep your lungs clear and active. This tool measures how well you are filling your lungs with each breath. Taking long deep breaths may help reverse or decrease the chance of developing breathing (pulmonary) problems (especially infection) following: A long period of time when you are unable to move or be active. BEFORE THE PROCEDURE  If the spirometer includes an indicator to show your best effort, your nurse or respiratory therapist will set it to a desired goal. If possible, sit up straight or lean slightly forward. Try not to slouch. Hold the incentive spirometer in an upright position. INSTRUCTIONS FOR USE  Sit on the edge of your bed if possible, or sit up as far as you can in bed or on a chair. Hold the incentive spirometer in an upright position. Breathe out normally. Place the mouthpiece in your mouth and seal your lips tightly around it. Breathe in slowly and as deeply as possible, raising the piston or the ball toward the top of the column. Hold your breath for 3-5 seconds or for as long as possible. Allow the piston or ball to fall to the bottom of the column. Remove the mouthpiece from your mouth and breathe out normally. Rest for a few seconds and repeat Steps 1 through 7 at least 10 times every 1-2 hours when you are awake. Take your time and take a few normal breaths between deep breaths. The spirometer may include an indicator to show your best effort. Use the indicator as a goal to work toward during each repetition. After each set of 10 deep breaths, practice coughing to be sure your lungs are clear. If you have an incision (the cut made at the time of surgery), support your incision when coughing by placing a  pillow or rolled up towels firmly against it. Once you are able to get out of bed, walk around indoors and cough well. You may stop using the incentive spirometer when instructed by your caregiver.  RISKS AND COMPLICATIONS Take your time so you do not get dizzy or light-headed. If you are in pain, you may need to take or ask for pain medication before doing incentive spirometry. It is harder to take a deep breath if you are having pain. AFTER USE Rest and breathe slowly and easily. It can be helpful to keep track of a log of your progress. Your caregiver can provide you with a simple table to help with this. If you are using the spirometer at home, follow these instructions: [place] IF:  You are having difficultly using the spirometer. You have trouble using the spirometer as often as instructed. Your pain medication is not giving enough relief while using the spirometer. You develop fever of 100.5 F (38.1 C) or higher. SEEK IMMEDIATE MEDICAL CARE IF:  You cough up bloody sputum that had not been present before. You develop fever of 102 F (38.9 C) or greater. You develop worsening pain at or near the incision site. MAKE  SURE YOU:  Understand these instructions. Will watch your condition. Will get help right away if you are not doing well or get worse. Document Released: 12/16/2006 Document Revised: 10/28/2011 Document Reviewed: 02/16/2007 [facility] Patient Information 2014 [place], [place].   ________________________________________________________________________

## 2021-03-19 ENCOUNTER — Other Ambulatory Visit: Payer: Self-pay

## 2021-03-19 ENCOUNTER — Encounter (HOSPITAL_COMMUNITY)
Admission: RE | Admit: 2021-03-19 | Discharge: 2021-03-19 | Disposition: A | Discharge status: Home / Self Care | Payer: Medicare Other | Source: Ambulatory Visit | Attending: Orthopaedic Surgery | Admitting: Orthopaedic Surgery

## 2021-03-19 ENCOUNTER — Encounter (HOSPITAL_COMMUNITY): Payer: Self-pay

## 2021-03-19 DIAGNOSIS — Z01818 Encounter for other preprocedural examination: Secondary | ICD-10-CM | POA: Diagnosis not present

## 2021-03-19 LAB — SURGICAL PCR SCREEN
MRSA, PCR: NEGATIVE
Staphylococcus aureus: NEGATIVE

## 2021-03-19 LAB — TYPE AND SCREEN
ABO/RH(D): O NEG
Antibody Screen: NEGATIVE

## 2021-03-19 LAB — HEMOGLOBIN A1C
Hgb A1c MFr Bld: 5.9 % — ABNORMAL HIGH (ref 4.8–5.6)
Mean Plasma Glucose: 122.63 mg/dL

## 2021-03-19 LAB — BASIC METABOLIC PANEL
Anion gap: 6 (ref 5–15)
BUN: 17 mg/dL (ref 8–23)
CO2: 27 mmol/L (ref 22–32)
Calcium: 9.2 mg/dL (ref 8.9–10.3)
Chloride: 105 mmol/L (ref 98–111)
Creatinine, Ser: 0.96 mg/dL (ref 0.61–1.24)
GFR, Estimated: >60 mL/min (ref >60)
Glucose, Bld: 108 mg/dL — ABNORMAL HIGH (ref 70–99)
Potassium: 3.9 mmol/L (ref 3.5–5.1)
Sodium: 138 mmol/L (ref 135–145)

## 2021-03-19 LAB — CBC
HCT: 47.2 % (ref 39.0–52.0)
Hemoglobin: 15.8 g/dL (ref 13.0–17.0)
MCH: 30.4 pg (ref 26.0–34.0)
MCHC: 33.5 g/dL (ref 30.0–36.0)
MCV: 90.8 fL (ref 80.0–100.0)
Platelets: 204 10*3/uL (ref 150–400)
RBC: 5.2 MIL/uL (ref 4.22–5.81)
RDW: 15 % (ref 11.5–15.5)
WBC: 5.6 10*3/uL (ref 4.0–10.5)
nRBC: 0 % (ref 0.0–0.2)

## 2021-03-19 LAB — GLUCOSE, CAPILLARY: Glucose-Capillary: 140 mg/dL — ABNORMAL HIGH (ref 70–99)

## 2021-03-21 ENCOUNTER — Telehealth: Payer: Self-pay | Admitting: *Deleted

## 2021-03-21 NOTE — Telephone Encounter (Signed)
Ortho bundle 90 day call completed with Cyndee Brightly survey.

## 2021-03-25 ENCOUNTER — Other Ambulatory Visit: Payer: Self-pay | Admitting: Cardiology

## 2021-03-28 ENCOUNTER — Other Ambulatory Visit: Payer: Self-pay | Admitting: Orthopaedic Surgery

## 2021-03-28 LAB — SARS CORONAVIRUS 2 (TAT 6-24 HRS): SARS Coronavirus 2: NEGATIVE

## 2021-03-29 NOTE — H&P (Signed)
TOTAL HIP ADMISSION H&P  Patient is admitted for right total hip arthroplasty.  Subjective:  Chief Complaint: right hip pain  HPI: Alexander [name], 70 y.o. male, has a history of pain and functional disability in the right hip(s) due to arthritis and patient has failed non-surgical conservative treatments for greater than 12 weeks to include NSAID's and/or analgesics, corticosteriod injections, use of assistive devices, weight reduction as appropriate, and activity modification.  Onset of symptoms was gradual starting 3 years ago with gradually worsening course since that time.The patient noted no past surgery on the right hip(s).  Patient currently rates pain in the right hip at 9 out of 10 with activity. Patient has night pain, worsening of pain with activity and weight bearing, trendelenberg gait, pain that interfers with activities of daily living, and pain with passive range of motion. Patient has evidence of subchondral sclerosis, periarticular osteophytes, and joint space narrowing by imaging studies. This condition presents safety issues increasing the risk of falls.  There is no current active infection.  Patient Active Problem List   Diagnosis Date Noted   Status post left hip replacement 12/08/2020   Unilateral primary osteoarthritis, left hip 10/16/2020   Unilateral primary osteoarthritis, right hip 10/16/2020   Hyperlipidemia due to dietary fat intake 10/01/2019   Morbid obesity ([place]) 10/01/2019   Excessive daytime sleepiness 03/03/2018   Obstructive sleep apnea treated with bilevel positive airway pressure (BiPAP) 03/03/2018   Sleep paralysis, recurrent isolated 03/03/2018   Cataplexy 03/03/2018   Abnormal dreams 03/03/2018   Breathholding 03/03/2018   Hypersomnia, persistent 09/03/2017   Exertional dyspnea 06/20/2017   Vasovagal syncope 06/20/2017   Essential hypertension 06/20/2017   History of cardiomyopathy 06/20/2017   Past Medical History:  Diagnosis Date    Allergy    Cancer ([place])    skin cancer to upper chest/basal cell 2007   Chronic neck pain    DJD   Chronic pain in right foot    DDD (degenerative disc disease), lumbar    Dyslipidemia    Erectile dysfunction    Exogenous obesity    H/O acquired cardiomyopathy 1996   RESOLVED (apparently was thought to have had an MI, but did not have any intervention).  He developed cardiomyopathy that forced him to retire from [employer]..  The current EF 60-65%.   History of fracture due to fall 1988   Bilateral wrist fractures   Hyperlipidemia    Hypertension    Hypogonadism male    IBS (irritable bowel syndrome)    Obstructive sleep apnea    Pre-diabetes    Sleep apnea    CPAP nightly    Past Surgical History:  Procedure Laterality Date   CARDIAC CATHETERIZATION  2000   Nonobstructive disease.  Persistently reduced EF   CARPAL TUNNEL RELEASE Left    CARPAL TUNNEL RELEASE Right 01/05/2019   Procedure: RIGHT CARPAL TUNNEL RELEASE;  Surgeon: Leanora Cover, MD;  Location: [facility];  Service: Orthopedics;  Laterality: Right;  Bier block   COLONOSCOPY     CORONARY CT ANGIOGRAM  07/2017   Coronary calcium score 0?.  Focal noncalcific plaque (30%) in proximal LAD.  Otherwise no significant CAD.  Normal aortic root (3.2 cm)   HAMMER TOE SURGERY Right    ingrown toenail surgery Bilateral    2017   IR RADIOLOGY PERIPHERAL GUIDED IV START  07/25/2017   IR US GUIDE VASC ACCESS RIGHT  07/25/2017   MENISCUS REPAIR     ORIF WRIST  FRACTURE Bilateral    PLANTAR FASCIA RELEASE Right    SHOULDER ARTHROSCOPY Left    TENDON REPAIR Right    right quad tendon repair   Tilt Table Test     Noted to have vasovagal syncope   TOTAL HIP ARTHROPLASTY Left 12/08/2020   Procedure: LEFT TOTAL HIP ARTHROPLASTY ANTERIOR APPROACH;  Surgeon: Mcarthur Rossetti, MD;  Location: [facility];  Service: Orthopedics;  Laterality: Left;  Needs RNFA   TRANSTHORACIC ECHOCARDIOGRAM  06/21/2015   Normal LV  function.  EF 57%.  Mild left atrial enlargement, trace AI, trace MR, mild PI.   TRANSTHORACIC ECHOCARDIOGRAM  06/2017   EF 60-65%.  Mild diastolic dysfunction.  No significant valvular abnormality.  Mild aortic calcification.   WISDOM TOOTH EXTRACTION Bilateral     No current facility-administered medications for this encounter.   Current Outpatient Medications  Medication Sig Dispense Refill Last Dose   aspirin EC 81 MG tablet Take 81 mg by mouth daily. Swallow whole.      Calcium Carb-Cholecalciferol (CALCIUM 600/VITAMIN D PO) Take 1 tablet by mouth daily.      losartan-hydrochlorothiazide (HYZAAR) 100-25 MG tablet TAKE 1 TABLET DAILY (Patient taking differently: Take 1 tablet by mouth daily.) 90 tablet 2    metFORMIN (GLUCOPHAGE-XR) 500 MG 24 hr tablet Take 500 mg by mouth daily with supper.      methocarbamol (ROBAXIN) 500 MG tablet Take 1 tablet (500 mg total) by mouth every 6 (six) hours as needed for muscle spasms. 30 tablet 1    modafinil (PROVIGIL) 200 MG tablet TAKE 1 TABLET DAILY (HAS APPOINTMENT 07-17-20) (Patient taking differently: Take 200 mg by mouth daily.) 90 tablet 1    Multiple Vitamin (MULTIVITAMIN WITH MINERALS) TABS tablet Take 1 tablet by mouth daily.      oxyCODONE (OXY IR/ROXICODONE) 5 MG immediate release tablet Take 1-2 tablets (5-10 mg total) by mouth every 6 (six) hours as needed for moderate pain (pain score 4-6). 30 tablet 0    simvastatin (ZOCOR) 20 MG tablet TAKE 1 TABLET DAILY (Patient taking differently: Take 20 mg by mouth at bedtime.) 90 tablet 3    Testosterone 10 MG/ACT (2%) GEL Place 3 Pump onto the skin daily. On each inner thigh      topiramate (TOPAMAX) 25 MG tablet Take 12.5 mg by mouth at bedtime.  12    vitamin B-12 (CYANOCOBALAMIN) 1000 MCG tablet Take 3,000 mcg by mouth daily.      aspirin 81 MG chewable tablet Chew 1 tablet (81 mg total) by mouth 2 (two) times daily. (Patient not taking: No sig reported) 30 tablet 0 Not Taking   carvedilol  (COREG) 12.5 MG tablet TAKE 1 TABLET TWICE A DAY AS WELL AS 6.25 MG TABLET TWICE A DAY (TOTAL OF 18.75 MG) 180 tablet 3    No Known Allergies  Social History   Tobacco Use   Smoking status: Former    Types: Cigarettes    Quit date: 1996    Years since quitting: 26.6   Smokeless tobacco: Never  Substance Use Topics   Alcohol use: Yes    Alcohol/week: 2.0 standard drinks    Types: 2 Standard drinks or equivalent per week    Comment: social    Family History  Problem Relation Age of Onset   Cancer Mother        duodenal CA   Heart disease Mother    Depression Mother    Stomach cancer Mother    Cancer Father  Hypertension Father    High Cholesterol Father    Heart disease Father    Diabetes Father    Hypertension Sister    High Cholesterol Sister    Heart disease Sister    Esophageal cancer Neg Hx    Rectal cancer Neg Hx    Colon cancer Neg Hx      Review of Systems  Musculoskeletal:  Positive for gait problem.  All other systems reviewed and are negative.  Objective:  Physical Exam Vitals reviewed.  Constitutional:      Appearance: Normal appearance.  HENT:     Head: Normocephalic and atraumatic.  Eyes:     Extraocular Movements: Extraocular movements intact.     Pupils: Pupils are equal, round, and reactive to light.  Cardiovascular:     Rate and Rhythm: Normal rate and regular rhythm.     Pulses: Normal pulses.  Pulmonary:     Effort: Pulmonary effort is normal.     Breath sounds: Normal breath sounds.  Abdominal:     Palpations: Abdomen is soft.  Musculoskeletal:     Cervical back: Normal range of motion and neck supple.     Right hip: Tenderness and bony tenderness present. Decreased range of motion. Decreased strength.  Neurological:     Mental Status: He is alert and oriented to person, place, and time.  Psychiatric:        Behavior: Behavior normal.    Vital signs in last 24 hours:    Labs:   Estimated body mass index is 33.85 kg/m as  calculated from the following:   Height as of 03/19/21: 6' (1.829 m).   Weight as of 03/19/21: 113.2 kg.   Imaging Review Plain radiographs demonstrate severe degenerative joint disease of the right hip(s). The bone quality appears to be excellent for age and reported activity level.      Assessment/Plan:  End stage arthritis, right hip(s)  The patient history, physical examination, clinical judgement of the provider and imaging studies are consistent with end stage degenerative joint disease of the right hip(s) and total hip arthroplasty is deemed medically necessary. The treatment options including medical management, injection therapy, arthroscopy and arthroplasty were discussed at length. The risks and benefits of total hip arthroplasty were presented and reviewed. The risks due to aseptic loosening, infection, stiffness, dislocation/subluxation,  thromboembolic complications and other imponderables were discussed.  The patient acknowledged the explanation, agreed to proceed with the plan and consent was signed. Patient is being admitted for inpatient treatment for surgery, pain control, PT, OT, prophylactic antibiotics, VTE prophylaxis, progressive ambulation and ADL's and discharge planning.The patient is planning to be discharged home with home health services

## 2021-03-29 NOTE — Anesthesia Preprocedure Evaluation (Addendum)
Anesthesia Evaluation  Patient identified by MRN, date of birth, ID band Patient awake    Reviewed: Allergy & Precautions, NPO status , Patient's Chart, lab work & pertinent test results, reviewed documented beta blocker date and time   History of Anesthesia Complications Negative for: history of anesthetic complications  Airway Mallampati: II  TM Distance: >3 FB Neck ROM: Full    Dental no notable dental hx. (+) Partial Lower, Poor Dentition, Chipped, Missing,    Pulmonary sleep apnea and Continuous Positive Airway Pressure Ventilation , former smoker,    Pulmonary exam normal breath sounds clear to auscultation       Cardiovascular hypertension, Pt. on medications and Pt. on home beta blockers Normal cardiovascular exam Rhythm:Regular Rate:Normal   '18 TTE - EF 60% to 65%. Grade 1 diastolic dysfunction. Mildly dilated LA. No valvulopathy     Neuro/Psych negative neurological ROS  negative psych ROS   GI/Hepatic Neg liver ROS,  IBS    Endo/Other  diabetes, Type 2  Renal/GU negative Renal ROS     Musculoskeletal  (+) Arthritis ,   Abdominal   Peds  Hematology negative hematology ROS (+)  Plt 219k    Anesthesia Other Findings Covid test negative   Reproductive/Obstetrics                            Anesthesia Physical  Anesthesia Plan  ASA: 3  Anesthesia Plan: Spinal and MAC   Post-op Pain Management:    Induction:   PONV Risk Score and Plan: 2 and Treatment may vary due to age or medical condition and Propofol infusion  Airway Management Planned: Natural Airway, Simple Face Mask and Nasal Cannula  Additional Equipment: None  Intra-op Plan:   Post-operative Plan:   Informed Consent: I have reviewed the patients History and Physical, chart, labs and discussed the procedure including the risks, benefits and alternatives for the proposed anesthesia with the patient or  authorized representative who has indicated his/her understanding and acceptance.       Plan Discussed with: CRNA and Anesthesiologist  Anesthesia Plan Comments: (Labs reviewed, platelets acceptable. Discussed risks and benefits of spinal, including spinal/epidural hematoma, infection, failed block, and PDPH. Patient expressed understanding and wished to proceed. )        Anesthesia Quick Evaluation

## 2021-03-30 ENCOUNTER — Encounter (HOSPITAL_COMMUNITY): Payer: Self-pay | Admitting: Orthopaedic Surgery

## 2021-03-30 ENCOUNTER — Ambulatory Visit (HOSPITAL_COMMUNITY): Payer: Medicare Other

## 2021-03-30 ENCOUNTER — Ambulatory Visit (HOSPITAL_COMMUNITY): Payer: Medicare Other | Admitting: Certified Registered Nurse Anesthetist

## 2021-03-30 ENCOUNTER — Ambulatory Visit (HOSPITAL_COMMUNITY): Payer: Medicare Other | Admitting: Physician Assistant

## 2021-03-30 ENCOUNTER — Other Ambulatory Visit: Payer: Self-pay

## 2021-03-30 ENCOUNTER — Observation Stay (HOSPITAL_COMMUNITY): Payer: Medicare Other

## 2021-03-30 ENCOUNTER — Inpatient Hospital Stay (HOSPITAL_COMMUNITY)
Admission: RE | Admit: 2021-03-30 | Discharge: 2021-04-01 | DRG: 470 | Disposition: A | Discharge status: Home Health | Payer: Medicare Other | Attending: Orthopaedic Surgery | Admitting: Orthopaedic Surgery

## 2021-03-30 ENCOUNTER — Encounter (HOSPITAL_COMMUNITY)
Admission: RE | Disposition: A | Discharge status: Home Health | Payer: Self-pay | Source: Home / Self Care | Attending: Orthopaedic Surgery

## 2021-03-30 DIAGNOSIS — E785 Hyperlipidemia, unspecified: Secondary | ICD-10-CM | POA: Diagnosis present

## 2021-03-30 DIAGNOSIS — Z7984 Long term (current) use of oral hypoglycemic drugs: Secondary | ICD-10-CM | POA: Diagnosis not present

## 2021-03-30 DIAGNOSIS — M1611 Unilateral primary osteoarthritis, right hip: Secondary | ICD-10-CM | POA: Diagnosis not present

## 2021-03-30 DIAGNOSIS — Z96642 Presence of left artificial hip joint: Secondary | ICD-10-CM | POA: Diagnosis not present

## 2021-03-30 DIAGNOSIS — Z87891 Personal history of nicotine dependence: Secondary | ICD-10-CM

## 2021-03-30 DIAGNOSIS — Z79899 Other long term (current) drug therapy: Secondary | ICD-10-CM | POA: Diagnosis not present

## 2021-03-30 DIAGNOSIS — Z83438 Family history of other disorder of lipoprotein metabolism and other lipidemia: Secondary | ICD-10-CM | POA: Diagnosis not present

## 2021-03-30 DIAGNOSIS — Z471 Aftercare following joint replacement surgery: Secondary | ICD-10-CM | POA: Diagnosis not present

## 2021-03-30 DIAGNOSIS — Z7982 Long term (current) use of aspirin: Secondary | ICD-10-CM

## 2021-03-30 DIAGNOSIS — Z96641 Presence of right artificial hip joint: Secondary | ICD-10-CM

## 2021-03-30 DIAGNOSIS — Z79890 Hormone replacement therapy (postmenopausal): Secondary | ICD-10-CM

## 2021-03-30 DIAGNOSIS — G4733 Obstructive sleep apnea (adult) (pediatric): Secondary | ICD-10-CM | POA: Diagnosis not present

## 2021-03-30 DIAGNOSIS — I10 Essential (primary) hypertension: Secondary | ICD-10-CM | POA: Diagnosis not present

## 2021-03-30 DIAGNOSIS — Z9989 Dependence on other enabling machines and devices: Secondary | ICD-10-CM | POA: Diagnosis not present

## 2021-03-30 DIAGNOSIS — Z419 Encounter for procedure for purposes other than remedying health state, unspecified: Secondary | ICD-10-CM

## 2021-03-30 DIAGNOSIS — Z8249 Family history of ischemic heart disease and other diseases of the circulatory system: Secondary | ICD-10-CM

## 2021-03-30 HISTORY — PX: TOTAL HIP ARTHROPLASTY: SHX124

## 2021-03-30 LAB — GLUCOSE, CAPILLARY: Glucose-Capillary: 121 mg/dL — ABNORMAL HIGH (ref 70–99)

## 2021-03-30 IMAGING — DX DG PORTABLE PELVIS
1 series · 1 of 1 positions shown · non-contrast
Comparison: [DATE]

FLUOROSCOPY TIME:  20 seconds

CLINICAL DATA: Right hip total arthroplasty

EXAM:
PORTABLE PELVIS 1-2 VIEWS; OPERATIVE RIGHT HIP WITH PELVIS

[pelvis ap]
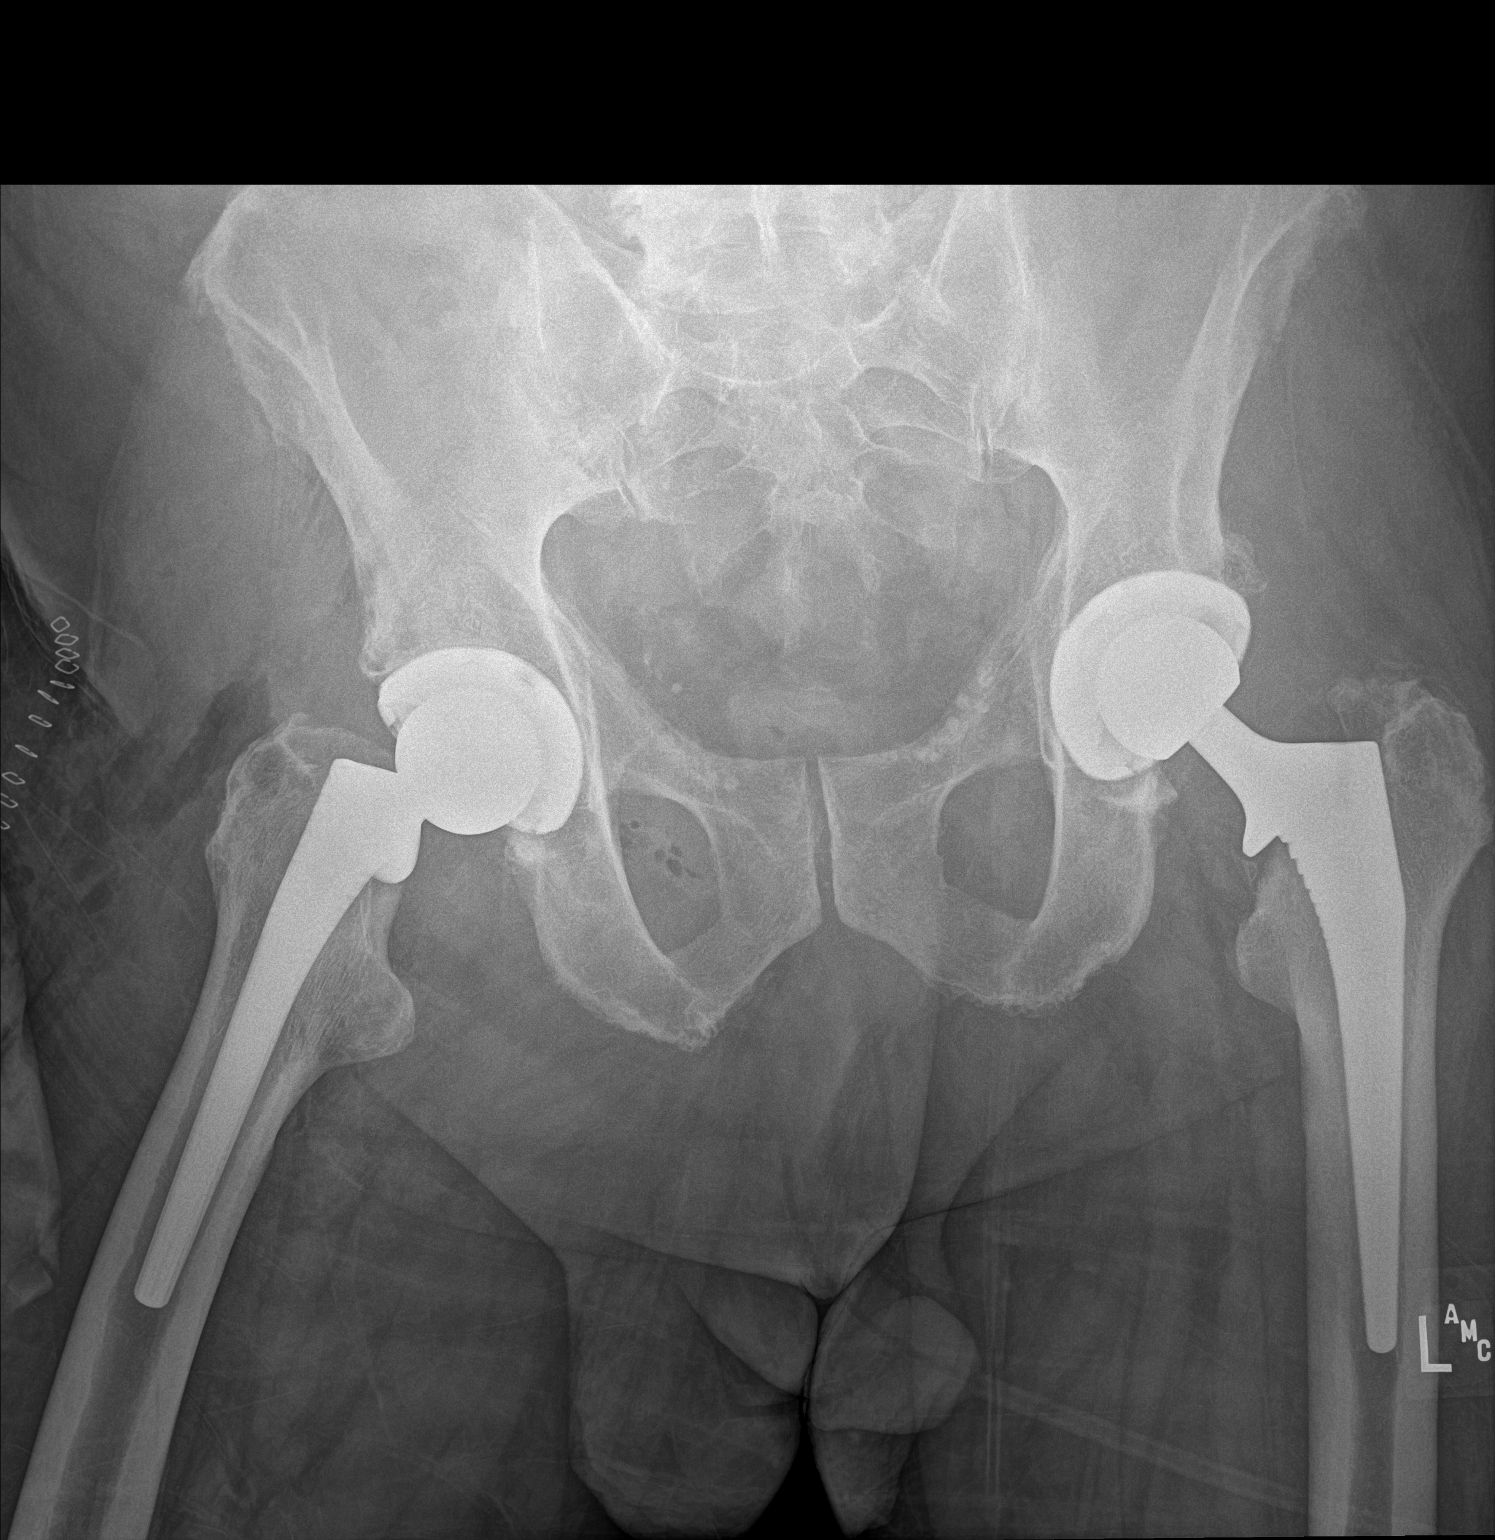

[1 of 1 positions shown; findings below may reference images not displayed]

FINDINGS: Intraoperative fluoroscopic images demonstrate right hip total
arthroplasty. Postoperative radiographs demonstrate expected
overlying postoperative changes. No evidence of perihardware
fracture or component loosening. Pre-existing left hip total
arthroplasty.
IMPRESSION: 1. Intraoperative fluoroscopic images demonstrate right hip total
arthroplasty.

2. Postoperative radiographs demonstrate expected overlying
postoperative changes. No evidence of perihardware fracture or
component loosening.

## 2021-03-30 IMAGING — XA DG HIP (WITH PELVIS) OPERATIVE*R*
1 series · 8 of 8 positions shown · non-contrast
Comparison: [DATE]

FLUOROSCOPY TIME:  20 seconds

CLINICAL DATA: Right hip total arthroplasty

EXAM:
PORTABLE PELVIS 1-2 VIEWS; OPERATIVE RIGHT HIP WITH PELVIS

[Series 1: unknown protocol · 8 of 8 slices shown]
[im 1/8]
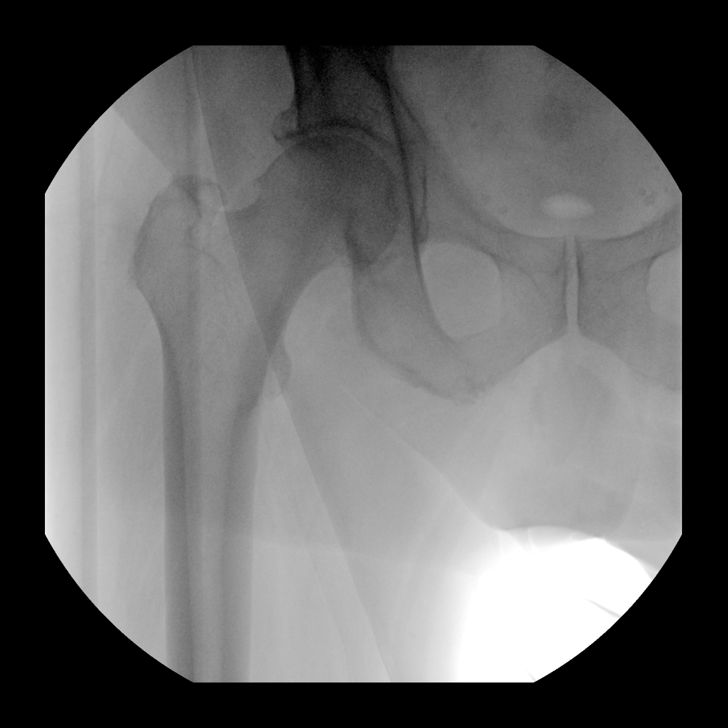
[im 2/8]
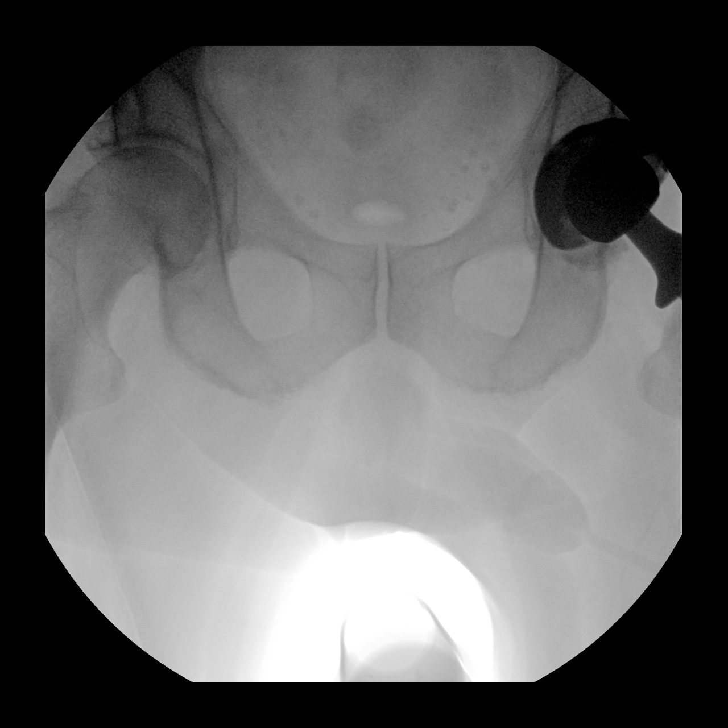
[im 3/8]
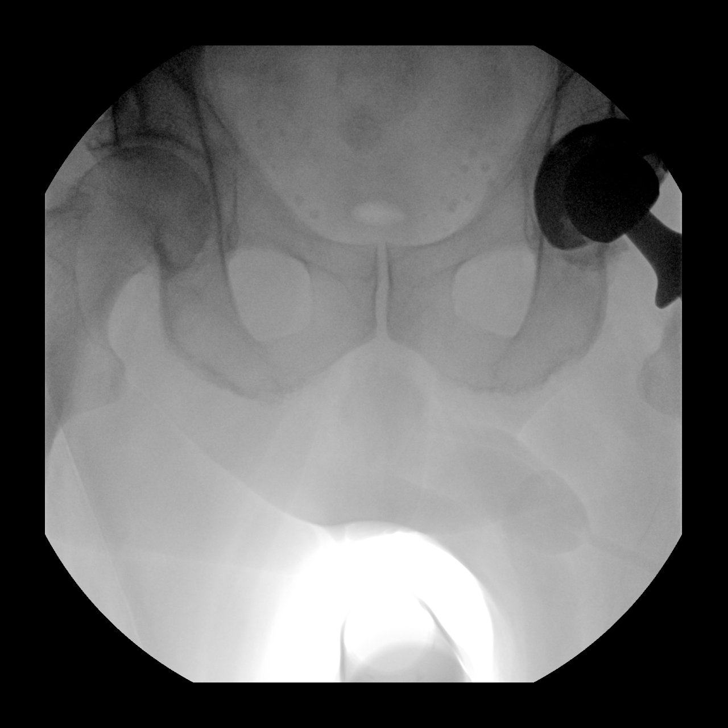
[im 4/8]
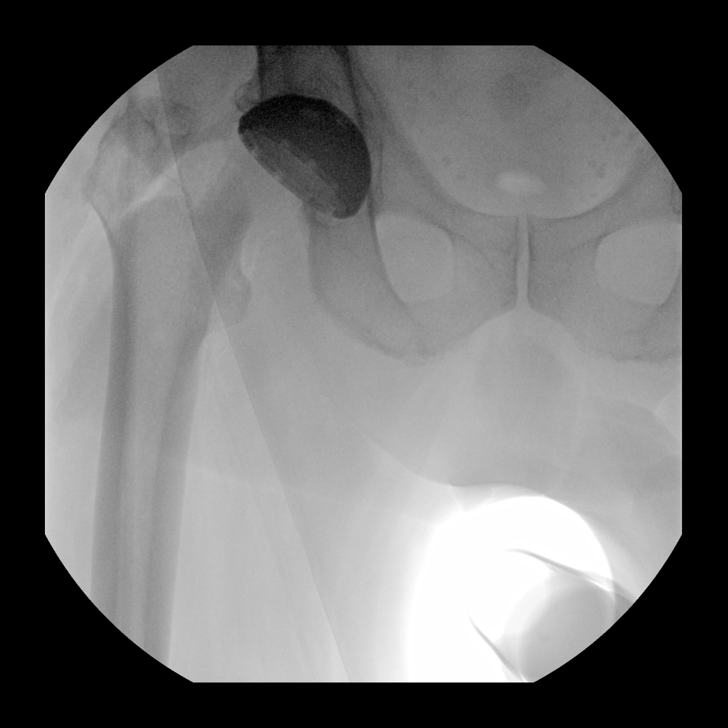
[im 5/8]
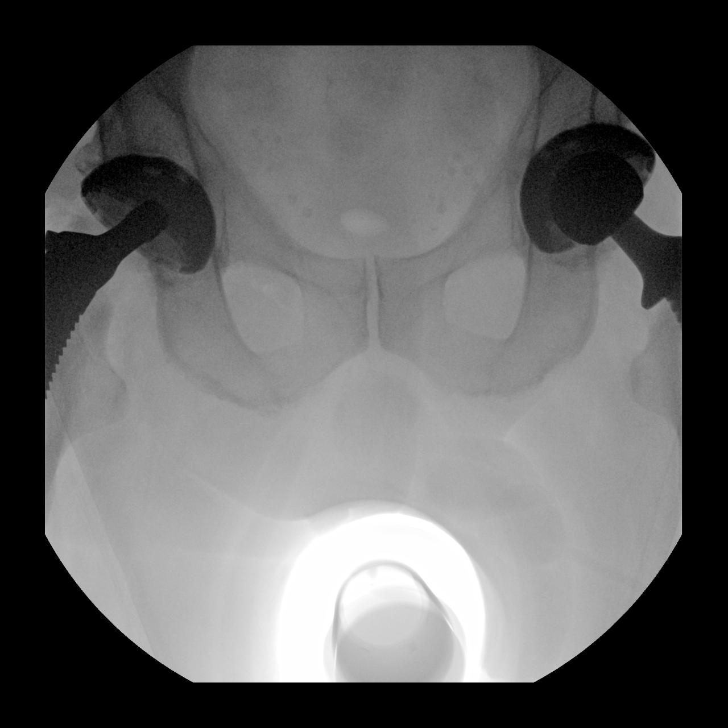
[im 6/8]
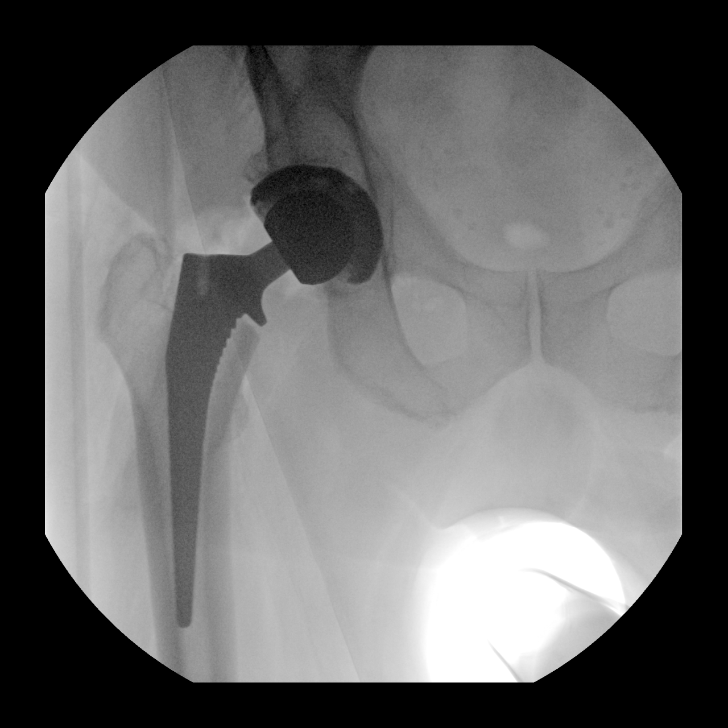
[im 7/8]
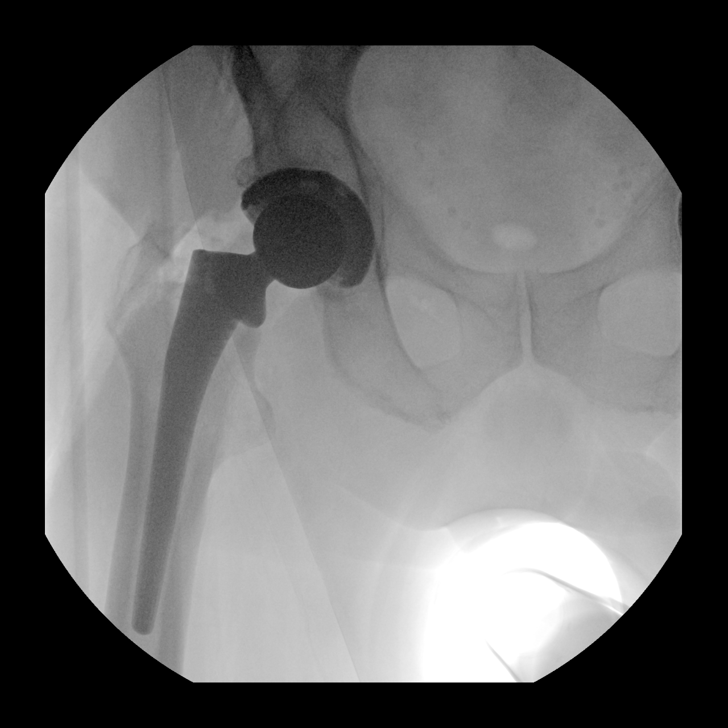
[im 8/8]
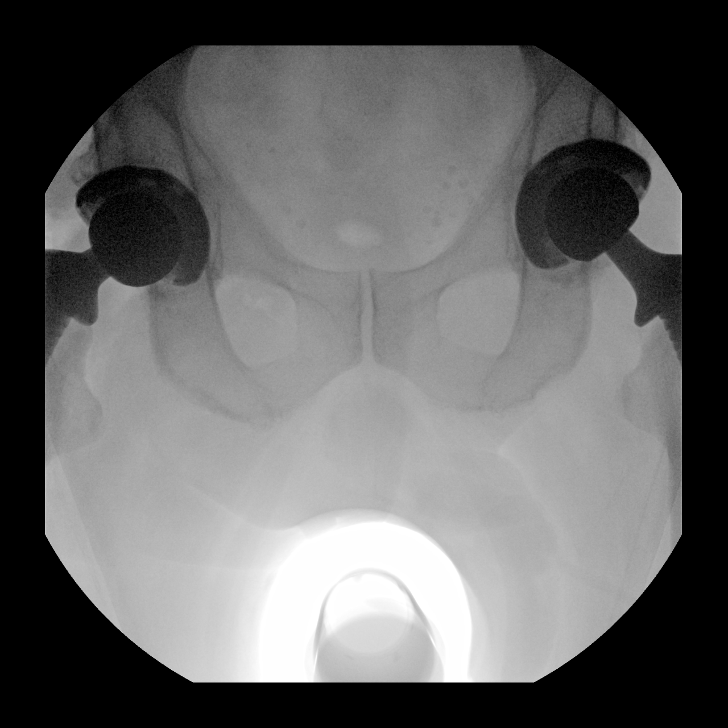

[8 of 8 positions shown; findings below may reference images not displayed]

FINDINGS: Intraoperative fluoroscopic images demonstrate right hip total
arthroplasty. Postoperative radiographs demonstrate expected
overlying postoperative changes. No evidence of perihardware
fracture or component loosening. Pre-existing left hip total
arthroplasty.
IMPRESSION: 1. Intraoperative fluoroscopic images demonstrate right hip total
arthroplasty.

2. Postoperative radiographs demonstrate expected overlying
postoperative changes. No evidence of perihardware fracture or
component loosening.

## 2021-03-30 SURGERY — ARTHROPLASTY, HIP, TOTAL, ANTERIOR APPROACH
Anesthesia: Monitor Anesthesia Care | Site: Hip | Laterality: Right

## 2021-03-30 MED ORDER — MIDAZOLAM HCL 2 MG/2ML IJ SOLN
INTRAMUSCULAR | Status: AC
  Filled 2021-03-30: qty 2

## 2021-03-30 MED ORDER — MODAFINIL 200 MG PO TABS
200.0000 mg | ORAL_TABLET | Freq: Every day | ORAL | Status: DC
  Administered 2021-03-31 – 2021-04-01 (×2): 200 mg via ORAL
  Filled 2021-03-30 (×2): qty 1

## 2021-03-30 MED ORDER — FENTANYL CITRATE (PF) 100 MCG/2ML IJ SOLN
25.0000 ug | INTRAMUSCULAR | Status: DC | PRN
  Administered 2021-03-30 (×2): 50 ug via INTRAVENOUS

## 2021-03-30 MED ORDER — HYDROMORPHONE HCL 1 MG/ML IJ SOLN
0.5000 mg | INTRAMUSCULAR | Status: DC | PRN
  Administered 2021-03-30 – 2021-03-31 (×6): 1 mg via INTRAVENOUS
  Filled 2021-03-30 (×6): qty 1

## 2021-03-30 MED ORDER — METOCLOPRAMIDE HCL 5 MG/ML IJ SOLN: 5.0000 mg | Freq: Three times a day (TID) | INTRAMUSCULAR | Status: DC | PRN

## 2021-03-30 MED ORDER — SODIUM CHLORIDE 0.9 % IR SOLN
Status: DC | PRN
  Administered 2021-03-30: 2000 mL

## 2021-03-30 MED ORDER — FENTANYL CITRATE (PF) 100 MCG/2ML IJ SOLN
INTRAMUSCULAR | Status: AC
  Filled 2021-03-30: qty 2

## 2021-03-30 MED ORDER — PANTOPRAZOLE SODIUM 40 MG PO TBEC
40.0000 mg | DELAYED_RELEASE_TABLET | Freq: Every day | ORAL | Status: DC
  Administered 2021-03-30 – 2021-04-01 (×3): 40 mg via ORAL
  Filled 2021-03-30 (×3): qty 1

## 2021-03-30 MED ORDER — EPHEDRINE 5 MG/ML INJ
INTRAVENOUS | Status: AC
  Filled 2021-03-30: qty 5

## 2021-03-30 MED ORDER — CEFAZOLIN SODIUM-DEXTROSE 2-4 GM/100ML-% IV SOLN
2.0000 g | INTRAVENOUS | Status: AC
  Administered 2021-03-30: 2 g via INTRAVENOUS
  Filled 2021-03-30: qty 100

## 2021-03-30 MED ORDER — ADULT MULTIVITAMIN W/MINERALS CH
1.0000 | ORAL_TABLET | Freq: Every day | ORAL | Status: DC
  Administered 2021-03-31 – 2021-04-01 (×2): 1 via ORAL
  Filled 2021-03-30 (×2): qty 1

## 2021-03-30 MED ORDER — ACETAMINOPHEN 325 MG PO TABS: 325.0000 mg | ORAL_TABLET | ORAL | Status: DC | PRN

## 2021-03-30 MED ORDER — ACETAMINOPHEN 325 MG PO TABS
325.0000 mg | ORAL_TABLET | Freq: Four times a day (QID) | ORAL | Status: DC | PRN
  Administered 2021-03-31: 650 mg via ORAL
  Filled 2021-03-30: qty 2

## 2021-03-30 MED ORDER — CEFAZOLIN SODIUM-DEXTROSE 1-4 GM/50ML-% IV SOLN
1.0000 g | Freq: Four times a day (QID) | INTRAVENOUS | Status: AC
  Administered 2021-03-30 (×2): 1 g via INTRAVENOUS
  Filled 2021-03-30 (×2): qty 50

## 2021-03-30 MED ORDER — PROPOFOL 10 MG/ML IV BOLUS
INTRAVENOUS | Status: AC
  Filled 2021-03-30: qty 20

## 2021-03-30 MED ORDER — CARVEDILOL 12.5 MG PO TABS
12.5000 mg | ORAL_TABLET | Freq: Two times a day (BID) | ORAL | Status: DC
  Administered 2021-03-30 – 2021-04-01 (×4): 12.5 mg via ORAL
  Filled 2021-03-30 (×4): qty 1

## 2021-03-30 MED ORDER — LOSARTAN POTASSIUM-HCTZ 100-25 MG PO TABS: 1.0000 | ORAL_TABLET | Freq: Every day | ORAL | Status: DC

## 2021-03-30 MED ORDER — OXYCODONE HCL 5 MG PO TABS
10.0000 mg | ORAL_TABLET | ORAL | Status: DC | PRN
  Administered 2021-03-30 – 2021-04-01 (×7): 15 mg via ORAL
  Filled 2021-03-30 (×7): qty 3

## 2021-03-30 MED ORDER — ALUM & MAG HYDROXIDE-SIMETH 200-200-20 MG/5ML PO SUSP: 30.0000 mL | ORAL | Status: DC | PRN

## 2021-03-30 MED ORDER — ACETAMINOPHEN 160 MG/5ML PO SOLN: 325.0000 mg | ORAL | Status: DC | PRN

## 2021-03-30 MED ORDER — OXYCODONE HCL 5 MG PO TABS
5.0000 mg | ORAL_TABLET | Freq: Once | ORAL | Status: DC | PRN
Start: 2021-03-30 — End: 2021-03-30

## 2021-03-30 MED ORDER — METHOCARBAMOL 500 MG PO TABS: 500.0000 mg | ORAL_TABLET | Freq: Four times a day (QID) | ORAL | 1 refills | Status: DC | PRN

## 2021-03-30 MED ORDER — METHOCARBAMOL 500 MG IVPB - SIMPLE MED
INTRAVENOUS | Status: AC
  Administered 2021-03-30: 500 mg
  Filled 2021-03-30: qty 50

## 2021-03-30 MED ORDER — CHLORHEXIDINE GLUCONATE 0.12 % MT SOLN
15.0000 mL | Freq: Once | OROMUCOSAL | Status: AC
  Administered 2021-03-30: 15 mL via OROMUCOSAL

## 2021-03-30 MED ORDER — ASPIRIN 81 MG PO CHEW: 81.0000 mg | CHEWABLE_TABLET | Freq: Two times a day (BID) | ORAL | 0 refills | Status: DC

## 2021-03-30 MED ORDER — PROPOFOL 1000 MG/100ML IV EMUL
INTRAVENOUS | Status: AC
  Filled 2021-03-30: qty 100

## 2021-03-30 MED ORDER — LACTATED RINGERS IV SOLN: INTRAVENOUS | Status: DC

## 2021-03-30 MED ORDER — ASPIRIN 81 MG PO CHEW
81.0000 mg | CHEWABLE_TABLET | Freq: Two times a day (BID) | ORAL | Status: DC
  Administered 2021-03-30 – 2021-04-01 (×4): 81 mg via ORAL
  Filled 2021-03-30 (×4): qty 1

## 2021-03-30 MED ORDER — METHOCARBAMOL 500 MG PO TABS
500.0000 mg | ORAL_TABLET | Freq: Four times a day (QID) | ORAL | Status: DC | PRN
  Administered 2021-03-31 – 2021-04-01 (×2): 500 mg via ORAL
  Filled 2021-03-30 (×2): qty 1

## 2021-03-30 MED ORDER — METOCLOPRAMIDE HCL 5 MG PO TABS: 5.0000 mg | ORAL_TABLET | Freq: Three times a day (TID) | ORAL | Status: DC | PRN

## 2021-03-30 MED ORDER — METHOCARBAMOL 500 MG IVPB - SIMPLE MED
500.0000 mg | Freq: Four times a day (QID) | INTRAVENOUS | Status: DC | PRN
  Filled 2021-03-30: qty 50

## 2021-03-30 MED ORDER — 0.9 % SODIUM CHLORIDE (POUR BTL) OPTIME
TOPICAL | Status: DC | PRN
  Administered 2021-03-30: 1000 mL

## 2021-03-30 MED ORDER — HYDROCHLOROTHIAZIDE 25 MG PO TABS
25.0000 mg | ORAL_TABLET | Freq: Every day | ORAL | Status: DC
  Administered 2021-03-30 – 2021-04-01 (×3): 25 mg via ORAL
  Filled 2021-03-30 (×3): qty 1

## 2021-03-30 MED ORDER — PROPOFOL 500 MG/50ML IV EMUL
INTRAVENOUS | Status: DC | PRN
  Administered 2021-03-30: 100 ug/kg/min via INTRAVENOUS

## 2021-03-30 MED ORDER — OXYCODONE HCL 5 MG PO TABS
5.0000 mg | ORAL_TABLET | ORAL | Status: DC | PRN
  Administered 2021-03-30: 5 mg via ORAL
  Filled 2021-03-30: qty 2

## 2021-03-30 MED ORDER — MIDAZOLAM HCL 5 MG/5ML IJ SOLN
INTRAMUSCULAR | Status: DC | PRN
  Administered 2021-03-30: 2 mg via INTRAVENOUS

## 2021-03-30 MED ORDER — DIPHENHYDRAMINE HCL 12.5 MG/5ML PO ELIX: 12.5000 mg | ORAL_SOLUTION | ORAL | Status: DC | PRN

## 2021-03-30 MED ORDER — ORAL CARE MOUTH RINSE: 15.0000 mL | Freq: Once | OROMUCOSAL | Status: AC

## 2021-03-30 MED ORDER — ONDANSETRON HCL 4 MG/2ML IJ SOLN: 4.0000 mg | Freq: Four times a day (QID) | INTRAMUSCULAR | Status: DC | PRN

## 2021-03-30 MED ORDER — TRANEXAMIC ACID-NACL 1000-0.7 MG/100ML-% IV SOLN
1000.0000 mg | INTRAVENOUS | Status: AC
  Administered 2021-03-30: 1000 mg via INTRAVENOUS
  Filled 2021-03-30: qty 100

## 2021-03-30 MED ORDER — POVIDONE-IODINE 10 % EX SWAB
2.0000 "application " | Freq: Once | CUTANEOUS | Status: AC
  Administered 2021-03-30: 2 via TOPICAL

## 2021-03-30 MED ORDER — PROPOFOL 10 MG/ML IV BOLUS
INTRAVENOUS | Status: DC | PRN
  Administered 2021-03-30: 30 mg via INTRAVENOUS

## 2021-03-30 MED ORDER — ONDANSETRON HCL 4 MG/2ML IJ SOLN: 4.0000 mg | Freq: Once | INTRAMUSCULAR | Status: DC | PRN

## 2021-03-30 MED ORDER — MEPERIDINE HCL 50 MG/ML IJ SOLN: 6.2500 mg | INTRAMUSCULAR | Status: DC | PRN

## 2021-03-30 MED ORDER — SIMVASTATIN 20 MG PO TABS
20.0000 mg | ORAL_TABLET | Freq: Every day | ORAL | Status: DC
  Administered 2021-03-30 – 2021-03-31 (×2): 20 mg via ORAL
  Filled 2021-03-30 (×2): qty 1

## 2021-03-30 MED ORDER — ONDANSETRON HCL 4 MG/2ML IJ SOLN
INTRAMUSCULAR | Status: AC
  Filled 2021-03-30: qty 2

## 2021-03-30 MED ORDER — PHENOL 1.4 % MT LIQD: 1.0000 | OROMUCOSAL | Status: DC | PRN

## 2021-03-30 MED ORDER — FENTANYL CITRATE (PF) 100 MCG/2ML IJ SOLN
INTRAMUSCULAR | Status: DC | PRN
  Administered 2021-03-30: 50 ug via INTRAVENOUS

## 2021-03-30 MED ORDER — ONDANSETRON HCL 4 MG/2ML IJ SOLN
INTRAMUSCULAR | Status: DC | PRN
  Administered 2021-03-30: 4 mg via INTRAVENOUS

## 2021-03-30 MED ORDER — TOPIRAMATE 25 MG PO TABS
12.5000 mg | ORAL_TABLET | Freq: Every day | ORAL | Status: DC
  Administered 2021-03-30 – 2021-03-31 (×2): 12.5 mg via ORAL
  Filled 2021-03-30 (×3): qty 0.5

## 2021-03-30 MED ORDER — LOSARTAN POTASSIUM 50 MG PO TABS
100.0000 mg | ORAL_TABLET | Freq: Every day | ORAL | Status: DC
  Administered 2021-03-30 – 2021-04-01 (×3): 100 mg via ORAL
  Filled 2021-03-30 (×3): qty 2

## 2021-03-30 MED ORDER — EPHEDRINE SULFATE-NACL 50-0.9 MG/10ML-% IV SOSY
PREFILLED_SYRINGE | INTRAVENOUS | Status: DC | PRN
  Administered 2021-03-30 (×2): 5 mg via INTRAVENOUS

## 2021-03-30 MED ORDER — OXYCODONE HCL 5 MG/5ML PO SOLN: 5.0000 mg | Freq: Once | ORAL | Status: DC | PRN

## 2021-03-30 MED ORDER — OXYCODONE HCL 5 MG PO TABS: 5.0000 mg | ORAL_TABLET | Freq: Four times a day (QID) | ORAL | 0 refills | Status: DC | PRN

## 2021-03-30 MED ORDER — METFORMIN HCL ER 500 MG PO TB24
500.0000 mg | ORAL_TABLET | Freq: Every day | ORAL | Status: DC
  Administered 2021-03-30 – 2021-03-31 (×2): 500 mg via ORAL
  Filled 2021-03-30 (×2): qty 1

## 2021-03-30 MED ORDER — VITAMIN B-12 1000 MCG PO TABS
3000.0000 ug | ORAL_TABLET | Freq: Every day | ORAL | Status: DC
  Administered 2021-03-31 – 2021-04-01 (×2): 3000 ug via ORAL
  Filled 2021-03-30 (×2): qty 3

## 2021-03-30 MED ORDER — DOCUSATE SODIUM 100 MG PO CAPS
100.0000 mg | ORAL_CAPSULE | Freq: Two times a day (BID) | ORAL | Status: DC
  Administered 2021-03-30 – 2021-04-01 (×4): 100 mg via ORAL
  Filled 2021-03-30 (×4): qty 1

## 2021-03-30 MED ORDER — MENTHOL 3 MG MT LOZG: 1.0000 | LOZENGE | OROMUCOSAL | Status: DC | PRN

## 2021-03-30 MED ORDER — ONDANSETRON HCL 4 MG PO TABS: 4.0000 mg | ORAL_TABLET | Freq: Four times a day (QID) | ORAL | Status: DC | PRN

## 2021-03-30 MED ORDER — SODIUM CHLORIDE 0.9 % IV SOLN: INTRAVENOUS | Status: DC

## 2021-03-30 MED ORDER — BUPIVACAINE IN DEXTROSE 0.75-8.25 % IT SOLN
INTRATHECAL | Status: DC | PRN
  Administered 2021-03-30: 1.8 mL via INTRATHECAL

## 2021-03-30 SURGICAL SUPPLY — 39 items
BAG COUNTER SPONGE SURGICOUNT (BAG) ×2 IMPLANT
BAG ZIPLOCK 12X15 (MISCELLANEOUS) IMPLANT
BENZOIN TINCTURE PRP APPL 2/3 (GAUZE/BANDAGES/DRESSINGS) IMPLANT
BLADE SAW SGTL 18X1.27X75 (BLADE) ×2 IMPLANT
COVER PERINEAL POST (MISCELLANEOUS) ×2 IMPLANT
COVER SURGICAL LIGHT HANDLE (MISCELLANEOUS) ×2 IMPLANT
CUP ACET PNNCL SECTR W/GRIP 56 (Hips) ×1 IMPLANT
DRAPE FOOT SWITCH (DRAPES) ×2 IMPLANT
DRAPE STERI IOBAN 125X83 (DRAPES) ×2 IMPLANT
DRAPE U-SHAPE 47X51 STRL (DRAPES) ×4 IMPLANT
DRSG AQUACEL AG ADV 3.5X10 (GAUZE/BANDAGES/DRESSINGS) ×2 IMPLANT
DURAPREP 26ML APPLICATOR (WOUND CARE) ×2 IMPLANT
ELECT REM PT RETURN 15FT ADLT (MISCELLANEOUS) ×2 IMPLANT
GAUZE XEROFORM 1X8 LF (GAUZE/BANDAGES/DRESSINGS) ×2 IMPLANT
GLOVE SRG 8 PF TXTR STRL LF DI (GLOVE) ×2 IMPLANT
GLOVE SURG ENC MOIS LTX SZ7.5 (GLOVE) ×2 IMPLANT
GLOVE SURG NEOPR MICRO LF SZ8 (GLOVE) ×2 IMPLANT
GLOVE SURG UNDER POLY LF SZ8 (GLOVE) ×2
GOWN STRL REUS W/TWL XL LVL3 (GOWN DISPOSABLE) ×4 IMPLANT
HANDPIECE INTERPULSE COAX TIP (DISPOSABLE) ×1
HEAD CERAMIC DELTA 36 PLUS 1.5 (Hips) ×2 IMPLANT
HOLDER FOLEY CATH W/STRAP (MISCELLANEOUS) ×2 IMPLANT
KIT TURNOVER KIT A (KITS) ×2 IMPLANT
LINER NEUTRAL 52MMX36MMX56N (Liner) ×2 IMPLANT
PACK ANTERIOR HIP CUSTOM (KITS) ×2 IMPLANT
PENCIL SMOKE EVACUATOR (MISCELLANEOUS) IMPLANT
PINN SECTOR W/GRIP ACE CUP 56 (Hips) ×2 IMPLANT
SET HNDPC FAN SPRY TIP SCT (DISPOSABLE) ×1 IMPLANT
STAPLER VISISTAT 35W (STAPLE) IMPLANT
STEM CORAIL KLA11 (Stem) ×2 IMPLANT
STRIP CLOSURE SKIN 1/2X4 (GAUZE/BANDAGES/DRESSINGS) IMPLANT
SUT ETHIBOND NAB CT1 #1 30IN (SUTURE) ×2 IMPLANT
SUT ETHILON 2 0 PS N (SUTURE) IMPLANT
SUT MNCRL AB 4-0 PS2 18 (SUTURE) IMPLANT
SUT VIC AB 0 CT1 36 (SUTURE) ×2 IMPLANT
SUT VIC AB 1 CT1 36 (SUTURE) ×2 IMPLANT
SUT VIC AB 2-0 CT1 27 (SUTURE) ×2
SUT VIC AB 2-0 CT1 TAPERPNT 27 (SUTURE) ×2 IMPLANT
TRAY FOLEY MTR SLVR 16FR STAT (SET/KITS/TRAYS/PACK) IMPLANT

## 2021-03-30 NOTE — Interval H&P Note (Signed)
History and Physical Interval Note: The patient understands that he is here today for right hip replacement to treat his right hip osteoarthritis.  There has been no acute or interval change in his medical status.  Please see recent H&P.  The risks and benefits of surgery have been explained in detail and informed consent is obtained.  The right hip has been marked.  03/30/2021 7:01 AM  Alexander [name]  has presented today for surgery, with the diagnosis of osteoarthritis right hip.  The various methods of treatment have been discussed with the patient and family. After consideration of risks, benefits and other options for treatment, the patient has consented to  Procedure(s): RIGHT TOTAL HIP ARTHROPLASTY ANTERIOR APPROACH (Right) as a surgical intervention.  The patient's history has been reviewed, patient examined, no change in status, stable for surgery.  I have reviewed the patient's chart and labs.  Questions were answered to the patient's satisfaction.     Mcarthur Rossetti

## 2021-03-30 NOTE — Evaluation (Signed)
Physical Therapy Evaluation Patient Details Name: Alexander [name] MRN: [id number] DOB: [birth year] Today's Date: 03/30/2021   History of Present Illness  Patient is 70 y.o. male s/p Rt THA anterior approach on 03/30/21 with PMH significant for OA, HTN, HLD, neck pain, OSA, CTR bil,  Lt THA on 12/09/19.    Clinical Impression  [name] is a 70 y.o. male POD 0 s/p Rt THA. Patient reports independence with mobility at baseline. Patient is now limited by functional impairments (see PT problem list below) and requires min assist for bed mobility and transfers and with RW. Patient was limited to sit<>stand transfer due to c/o dizziness with standing EOB. Mod assist to return to supine. Patient will benefit from continued skilled PT interventions to address impairments and progress towards PLOF. Acute PT will follow to progress mobility and stair training in preparation for safe discharge home.     Follow Up Recommendations Follow surgeon's recommendation for DC plan and follow-up therapies;Home health PT    Equipment Recommendations  None recommended by PT    Recommendations for Other Services       Precautions / Restrictions Precautions Precautions: Fall Restrictions Weight Bearing Restrictions: No Other Position/Activity Restrictions: WBAT      Mobility  Bed Mobility Overal bed mobility: Needs Assistance Bed Mobility: Supine to Sit;Sit to Supine     Supine to sit: Min assist;HOB elevated Sit to supine: Mod assist   General bed mobility comments: cues for use of bed rail to pivot to EOB and assist for Rt LE to move. Mod assist to return to supine and boost/reposition due to dizziness in standing. Pt assisted with use of bil UE on HOB rail to pull up.    Transfers Overall transfer level: Needs assistance Equipment used: Rolling walker (2 wheeled) Transfers: Sit to/from Stand Sit to Stand: Min assist;From elevated surface;+2 safety/equipment         General  transfer comment: Cues for hand placement on RW for power up. Min assist to rise from EOB, pt steady with no lateral sway. +2 for safety as pt has history of vsovagal episodes. Pt reported dizziness after weight shifting and attempt to take small forward step. Returned to sitting with min assist and cues to reach back.  Ambulation/Gait                Stairs            Wheelchair Mobility    Modified Rankin (Stroke Patients Only)       Balance                                             Pertinent Vitals/Pain Pain Assessment: Faces Faces Pain Scale: Hurts worst Pain Location: Rt hip Pain Descriptors / Indicators: Discomfort;Grimacing;Guarding Pain Intervention(s): Limited activity within patient's tolerance;Monitored during session;Repositioned;Premedicated before session    Home Living Family/patient expects to [name] discharged to:: Private residence Living Arrangements: Spouse/significant other Available Help at Discharge: Family Type of Home: House Home Access: Stairs to enter Entrance Stairs-Rails: Right Entrance Stairs-Number of Steps: 1+1 Home Layout: One level Home Equipment: Environmental consultant - 2 wheels;Bedside commode;Shower seat      Prior Function Level of Independence: Independent               Hand Dominance   Dominant Hand: Left    Extremity/Trunk Assessment   Upper Extremity Assessment  Upper Extremity Assessment: Overall WFL for tasks assessed    Lower Extremity Assessment Lower Extremity Assessment: Overall WFL for tasks assessed;RLE deficits/detail RLE Deficits / Details: limtied by pain RLE: Unable to fully assess due to pain RLE Sensation:  (slight decrease in bottom of foot and hip/buttock) RLE Coordination: WNL    Cervical / Trunk Assessment Cervical / Trunk Assessment: Normal  Communication   Communication: No difficulties  Cognition Arousal/Alertness: Awake/alert Behavior During Therapy: WFL for tasks  assessed/performed Overall Cognitive Status: Within Functional Limits for tasks assessed                                        General Comments      Exercises     Assessment/Plan    PT Assessment Patient needs continued PT services  PT Problem List Decreased strength;Decreased range of motion;Decreased activity tolerance;Decreased balance;Decreased mobility;Decreased knowledge of use of DME;Decreased knowledge of precautions;Pain       PT Treatment Interventions DME instruction;Gait training;Stair training;Functional mobility training;Therapeutic activities;Therapeutic exercise;Balance training;Patient/family education    PT Goals (Current goals can [name] found in the Care Plan section)  Acute Rehab PT Goals Patient Stated Goal: recover indpendence PT Goal Formulation: With patient Time For Goal Achievement: 04/06/21 Potential to Achieve Goals: Good    Frequency 7X/week   Barriers to discharge        Co-evaluation               AM-PAC PT "6 Clicks" Mobility  Outcome Measure Help needed turning from your back to your side while in a flat bed without using bedrails?: A Little Help needed moving from lying on your back to sitting on the side of a flat bed without using bedrails?: A Little Help needed moving to and from a bed to a chair (including a wheelchair)?: A Little Help needed standing up from a chair using your arms (e.g., wheelchair or bedside chair)?: A Little Help needed to walk in hospital room?: A Lot Help needed climbing 3-5 steps with a railing? : A Lot 6 Click Score: 16    End of Session Equipment Utilized During Treatment: Gait belt Activity Tolerance: Treatment limited secondary to medical complications (Comment);Patient limited by pain (dizziness in standing) Patient left: in bed;with call bell/phone within reach;with SCD's reapplied;with family/visitor present;with bed alarm set Nurse Communication: Mobility status PT Visit  Diagnosis: Muscle weakness (generalized) (M62.81);Difficulty in walking, not elsewhere classified (R26.2);Pain Pain - Right/Left: Right Pain - part of body: Hip    Time: 1359-1419 PT Time Calculation (min) (ACUTE ONLY): 20 min   Charges:   PT Evaluation $PT Eval Low Complexity: 1 Low          Verner Mould, DPT Acute Rehabilitation Services Office [phone] Pager [phone]   Jacques Navy 03/30/2021, 3:25 PM

## 2021-03-30 NOTE — Op Note (Signed)
NAME: Alexander [name], Alexander [name] MEDICAL RECORD NO: [id number] ACCOUNT NO: [id number] DATE OF BIRTH: 11-22-1950 FACILITY: [facility]ATION: [facility] PHYSICIAN: Lind Guest. [name], MD  Operative Report   DATE OF PROCEDURE: 03/30/2021  PREOPERATIVE DIAGNOSIS:  Primary osteoarthritis and degenerative joint disease, right hip.  POSTOPERATIVE DIAGNOSIS:  Primary osteoarthritis and degenerative joint disease, right hip.  PROCEDURE:  Right total hip arthroplasty through direct anterior approach.  IMPLANTS:  DePuy sector Gription acetabular component, size 56, size 36+0 neutral polyethylene liner, size 11 Corail femoral component with varus offset, size 36+1.5 ceramic hip ball.  SURGEON:  Lind Guest. [name], MD  ASSISTANT:  Erskine Emery, PA-C.  ANESTHESIA:  Spinal.  ANTIBIOTICS:  2 g IV Ancef.  BLOOD LOSS:  250 mL.  COMPLICATIONS:  None.  INDICATIONS:  The patient is a 70 year old gentleman with debilitating arthritis involving his right hip.  He actually had similar arthritis in his left hip and we replaced his left hip in April of this year.  At this point, having the success of a left  total hip arthroplasty combined with a combination of severe arthritis in his right hip.  He does wish to proceed with a right total hip arthroplasty at this point.  Having had this done before he is fully aware of the risk of acute blood loss anemia,  nerve or vessel injury, fracture, infection, dislocation, DVT, implant failure and skin and soft tissue issues.  He understands our goals are to decrease pain, improve mobility and overall improve quality of life.  DESCRIPTION OF PROCEDURE:  After informed consent was obtained, appropriate right hip was marked, he was brought to the operating room and sat up on a stretcher where spinal anesthesia was obtained.  He was laid in supine position on the stretcher.   Foley catheter was placed and traction boots were placed on both his feet.  Of note, he started off  short on this right operative side.  He was placed supine on the Hana fracture table, the perineal post in place and both legs in line skeletal traction  device and he has no traction applied.  His right operative hip was prepped and draped in DuraPrep and sterile drapes.  A timeout was called and he was identified as correct patient, correct right hip.  I then made an incision just inferior and posterior  to the anterior superior iliac spine and carried this obliquely down the leg.  We dissected down tensor fascia lata muscle.  Tensor fascia was then divided longitudinally to proceed with direct anterior approach to the hip.  We identified and cauterized  circumflex vessels, identified the hip capsule in L-type format finding a moderate joint effusion.  We placed curved retractors around the medial, lateral femoral neck and made our femoral neck cut with oscillating saw just proximal to the lesser  trochanter and completed with an osteotome.  We placed a corkscrew guide in the femoral head and removed the femoral head in its entirety.  We then placed a bent Hohmann over the medial acetabular rim and removed remnants of the acetabular labrum and  other debris.  We then began reaming from a size 43 reamer in a stepwise increments going up to a size 55 with all reamers placed under direct visualization.  Last reamer was placed under direct fluoroscopy, so we could obtain our depth of reaming, our  inclination and anteversion.  I then placed a real DePuy sector Gription acetabular component, size 56 and a 36+0 neutral polyethylene liner for that size  acetabular component.  Attention was then turned to the femur.  The leg externally rotated 120  degrees, extended and adducted. We were able to place a Mueller retractor medially and Hohman retractor behind the greater trochanter.  We released lateral joint capsule and used a box cutting osteotome to enter the femoral canal and a rongeur to  lateralize.  We then  began broaching using the Corail broaching system from a size 8 going up to a size 11.  With a size 11 in place, we trialed varus offset femoral neck because we needed more offset, but it was actually higher neck cut and then we made  with a 36+1.5 hip ball and reduced this in the acetabulum.  We were pleased with leg length, offset, range of motion and stability assessed mechanically and radiographically.  We then dislocated the hip, removed the trial components.  We placed the real  Corail femoral component with varus offset size 11 and the real 36+1.5 ceramic hip ball and again reduced this in acetabulum and it was stable.  We then irrigated the soft tissue with normal saline solution using pulsatile lavage.  We closed the joint  capsule with interrupted #1 Ethibond suture followed by #1 Vicryl to close the tensor fascia.  0 Vicryl was used to close the deep tissue and 2-0 Vicryl was used to close the subcutaneous tissue.  The skin was reapproximated with staples.  Aquacel  dressing was applied.  He was taken off the Hana table and taken to recovery room in stable condition with all final counts being correct.  No complications noted.  Of note, Benita Stabile, PA-C did assist during the entire case and assistance was crucial for  facilitating every aspect of this case.   PUS D: 03/30/2021 9:49:42 am T: 03/30/2021 5:48:00 pm  JOB: [id number]

## 2021-03-30 NOTE — Brief Op Note (Signed)
03/30/2021  9:51 AM  PATIENT:  Alexander [name]  70 y.o. male  PRE-OPERATIVE DIAGNOSIS:  osteoarthritis right hip  POST-OPERATIVE DIAGNOSIS:  osteoarthritis right hip  PROCEDURE:  Procedure(s): RIGHT TOTAL HIP ARTHROPLASTY ANTERIOR APPROACH (Right)  SURGEON:  Surgeon(s) and Role:    Mcarthur Rossetti, MD - Primary  PHYSICIAN ASSISTANT: Benita Stabile, PA-C  ANESTHESIA:   spinal  EBL:  250 mL   COUNTS:  YES  DICTATION: .Other Dictation: Dictation Number [id number]  PLAN OF CARE: Admit for overnight observation  PATIENT DISPOSITION:  PACU - hemodynamically stable.   Delay start of Pharmacological VTE agent (>24hrs) due to surgical blood loss or risk of bleeding: no

## 2021-03-30 NOTE — Care Plan (Signed)
Ortho Bundle Case Management Note  Patient Details  Name: Alexander [name] MRN: [id number] Date of Birth: 1951/05/25   Concepcion Living RNCM call to patient prior to surgery on 03/21/21. He is an Ortho bundle patient through [facility] and is agreeable to case management. He lives with his wife and his plan is to return home after discharge. He has all DME needed for post op care as he recently had his Left total hip arthroplasty done this year. Anticipate HHPT will be needed after short hospital stay. Mailed all post op care instructions. Will continue to follow for needs.                 DME Arranged:   (Patient has previous hip replacement in April 2022- has all DME needed) DME Agency:     HH Arranged:  PT [place]:  [place]  Additional Comments: Please contact me with any questions of if this plan should need to change.  Jamse Arn, RN, BSN, [employer]  [phone] 03/30/2021, 2:46 PM

## 2021-03-30 NOTE — Discharge Instructions (Signed)

## 2021-03-30 NOTE — Transfer of Care (Signed)
Immediate Anesthesia Transfer of Care Note  Patient: Alexander [name]  Procedure(s) Performed: RIGHT TOTAL HIP ARTHROPLASTY ANTERIOR APPROACH (Right: Hip)  Patient Location: PACU  Anesthesia Type:Spinal  Level of Consciousness: awake, alert  and oriented  Airway & Oxygen Therapy: Patient Spontanous Breathing and Patient connected to face mask oxygen  Post-op Assessment: Report given to RN and Post -op Vital signs reviewed and stable  Post vital signs: Reviewed and stable  Last Vitals:  Vitals Value Taken Time  BP    Temp    Pulse 54 03/30/21 1011  Resp 17 03/30/21 1011  SpO2 96 % 03/30/21 1011  Vitals shown include unvalidated device data.  Last Pain:  Vitals:   03/30/21 0638  TempSrc: Oral  PainSc:       Patients Stated Pain Goal: 5 ([id number])  Complications: No notable events documented.

## 2021-03-30 NOTE — Plan of Care (Signed)
  Problem: Clinical Measurements: Goal: Postoperative complications will be avoided or minimized Outcome: Progressing   Problem: Pain Management: Goal: Pain level will decrease with appropriate interventions Outcome: Progressing   Problem: Activity: Goal: Ability to avoid complications of mobility impairment will improve Outcome: Progressing Goal: Ability to tolerate increased activity will improve Outcome: Progressing

## 2021-03-30 NOTE — Anesthesia Procedure Notes (Signed)
Spinal  Patient location during procedure: OR End time: 04/18/2020 9:42 AM Reason for block: surgical anesthesia Staffing Performed: resident/CRNA  Anesthesiologist: Janeece Riggers, MD Resident/CRNA: Maxwell Caul, CRNA Preanesthetic Checklist Completed: patient identified, IV checked, site marked, risks and benefits discussed, surgical consent, monitors and equipment checked, pre-op evaluation and timeout performed Spinal Block Patient position: sitting Prep: DuraPrep Patient monitoring: heart rate, cardiac monitor, continuous pulse ox and blood pressure Approach: midline Location: L3-4 Injection technique: single-shot Needle Needle type: Pencan  Needle gauge: 24 G Needle length: 10 cm Assessment Sensory level: T4 Events: CSF return Additional Notes IV functioning, monitors applied to pt. Expiration date of kit checked and confirmed to be in date. Sterile prep and drape, hand hygiene and sterile gloves used. Pt was positioned and spine was prepped in sterile fashion. Skin was anesthetized with lidocaine. Free flow of clear CSF obtained prior to injecting local anesthetic into CSF x 1 attempt. Spinal needle aspirated freely following injection. Needle was carefully withdrawn, and pt tolerated procedure well. Loss of motor and sensory on exam post injection. Dr Ambrose Pancoast at bedside during entire placement.

## 2021-03-30 NOTE — Care Plan (Signed)
[facility] RNCM call to patient prior to surgery on 03/21/21 to discuss his upcoming right total hip arthroplasty with Dr. [name]. He is an Ortho bundle patient through [facility] and is agreeable to case management. He lives with his wife and his plan is to return home after discharge. He has all DME needed for post op care as he recently had his Left total hip arthroplasty done this year. Anticipate HHPT will be needed after short hospital stay. Mailed all post op care instructions. Will continue to follow for needs.

## 2021-03-31 DIAGNOSIS — Z79899 Other long term (current) drug therapy: Secondary | ICD-10-CM | POA: Diagnosis not present

## 2021-03-31 DIAGNOSIS — Z79890 Hormone replacement therapy (postmenopausal): Secondary | ICD-10-CM | POA: Diagnosis not present

## 2021-03-31 DIAGNOSIS — Z96642 Presence of left artificial hip joint: Secondary | ICD-10-CM | POA: Diagnosis present

## 2021-03-31 DIAGNOSIS — M1611 Unilateral primary osteoarthritis, right hip: Secondary | ICD-10-CM | POA: Diagnosis present

## 2021-03-31 DIAGNOSIS — I10 Essential (primary) hypertension: Secondary | ICD-10-CM | POA: Diagnosis present

## 2021-03-31 DIAGNOSIS — M25551 Pain in right hip: Secondary | ICD-10-CM | POA: Diagnosis not present

## 2021-03-31 DIAGNOSIS — E785 Hyperlipidemia, unspecified: Secondary | ICD-10-CM | POA: Diagnosis present

## 2021-03-31 DIAGNOSIS — Z87891 Personal history of nicotine dependence: Secondary | ICD-10-CM | POA: Diagnosis not present

## 2021-03-31 DIAGNOSIS — Z7984 Long term (current) use of oral hypoglycemic drugs: Secondary | ICD-10-CM | POA: Diagnosis not present

## 2021-03-31 DIAGNOSIS — G4733 Obstructive sleep apnea (adult) (pediatric): Secondary | ICD-10-CM | POA: Diagnosis present

## 2021-03-31 DIAGNOSIS — Z83438 Family history of other disorder of lipoprotein metabolism and other lipidemia: Secondary | ICD-10-CM | POA: Diagnosis not present

## 2021-03-31 DIAGNOSIS — Z8249 Family history of ischemic heart disease and other diseases of the circulatory system: Secondary | ICD-10-CM | POA: Diagnosis not present

## 2021-03-31 DIAGNOSIS — Z7982 Long term (current) use of aspirin: Secondary | ICD-10-CM | POA: Diagnosis not present

## 2021-03-31 LAB — CBC
HCT: 42.5 % (ref 39.0–52.0)
Hemoglobin: 13.6 g/dL (ref 13.0–17.0)
MCH: 30.4 pg (ref 26.0–34.0)
MCHC: 32 g/dL (ref 30.0–36.0)
MCV: 95.1 fL (ref 80.0–100.0)
Platelets: 190 10*3/uL (ref 150–400)
RBC: 4.47 MIL/uL (ref 4.22–5.81)
RDW: 15.6 % — ABNORMAL HIGH (ref 11.5–15.5)
WBC: 7.8 10*3/uL (ref 4.0–10.5)
nRBC: 0 % (ref 0.0–0.2)

## 2021-03-31 LAB — BASIC METABOLIC PANEL
Anion gap: 8 (ref 5–15)
BUN: 14 mg/dL (ref 8–23)
CO2: 28 mmol/L (ref 22–32)
Calcium: 8.5 mg/dL — ABNORMAL LOW (ref 8.9–10.3)
Chloride: 103 mmol/L (ref 98–111)
Creatinine, Ser: 0.91 mg/dL (ref 0.61–1.24)
GFR, Estimated: >60 mL/min (ref >60)
Glucose, Bld: 131 mg/dL — ABNORMAL HIGH (ref 70–99)
Potassium: 3.5 mmol/L (ref 3.5–5.1)
Sodium: 139 mmol/L (ref 135–145)

## 2021-03-31 NOTE — Progress Notes (Signed)
Physical Therapy Treatment Patient Details Name: Alexander [name] MRN: [id number] DOB: February 19, 1951 Today's Date: 03/31/2021    History of Present Illness Patient is 69 y.o. male s/p Rt THA anterior approach on 03/30/21 with PMH significant for OA, HTN, HLD, neck pain, OSA, CTR bil,  Lt THA on 12/09/19.    PT Comments    Progressing with mobility. BP during session: 126/86, 137/101, 123/82. Pain rated 10/10 with activity-encouraged pt to keep RN and MD aware.Will plan to have a 2nd session to practice stair negotiation if able.     Follow Up Recommendations  Follow surgeon's recommendation for DC plan and follow-up therapies;Supervision for mobility/OOB (HHPT f/u per chart)     Equipment Recommendations       Recommendations for Other Services       Precautions / Restrictions Precautions Precautions: Fall Restrictions Weight Bearing Restrictions: No Other Position/Activity Restrictions: WBAT    Mobility  Bed Mobility Overal bed mobility: Needs Assistance Bed Mobility: Supine to Sit     Supine to sit: Min assist     General bed mobility comments: Assist for R LE. Cues provided. Increased time.    Transfers Overall transfer level: Needs assistance Equipment used: Rolling walker (2 wheeled) Transfers: Sit to/from Stand Sit to Stand: Min assist;From elevated surface         General transfer comment: Assist to rise, steady, control descent. Cues for safety, technique, hand/LE placement.  Ambulation/Gait Ambulation/Gait assistance: Min guard Gait Distance (Feet): 90 Feet Assistive device: Rolling walker (2 wheeled) Gait Pattern/deviations: Step-to pattern;Step-through pattern;Decreased stride length;Decreased step length - right     General Gait Details: Progressed to Min guard asssit and step through patterns as distance increased. Pt denied dizzinesss. Slow but steady gait speed. Followed with recliner and transported back to room 2* fatigue, pain.   Stairs              Wheelchair Mobility    Modified Rankin (Stroke Patients Only)       Balance Overall balance assessment: Needs assistance         Standing balance support: Bilateral upper extremity supported Standing balance-Leahy Scale: Poor                              Cognition Arousal/Alertness: Awake/alert Behavior During Therapy: WFL for tasks assessed/performed Overall Cognitive Status: Within Functional Limits for tasks assessed                                        Exercises Total Joint Exercises Ankle Circles/Pumps: AROM;Both;10 reps Quad Sets: AROM;Both;10 reps Heel Slides: AAROM;Right;10 reps Hip ABduction/ADduction: AAROM;Right;10 reps    General Comments        Pertinent Vitals/Pain Pain Assessment: 0-10 Pain Score: 10-Worst pain ever Pain Location: Rt hip/thigh Pain Descriptors / Indicators: Sharp;Grimacing;Guarding;Discomfort Pain Intervention(s): Limited activity within patient's tolerance;Monitored during session;Ice applied;Repositioned    Home Living                      Prior Function            PT Goals (current goals can now be found in the care plan section) Progress towards PT goals: Progressing toward goals    Frequency    7X/week      PT Plan Current plan remains appropriate    Co-evaluation  AM-PAC PT "6 Clicks" Mobility   Outcome Measure  Help needed turning from your back to your side while in a flat bed without using bedrails?: A Little Help needed moving from lying on your back to sitting on the side of a flat bed without using bedrails?: A Little Help needed moving to and from a bed to a chair (including a wheelchair)?: A Little Help needed standing up from a chair using your arms (e.g., wheelchair or bedside chair)?: A Little Help needed to walk in hospital room?: A Little Help needed climbing 3-5 steps with a railing? : A Little 6 Click Score: 18     End of Session Equipment Utilized During Treatment: Gait belt Activity Tolerance: Patient limited by pain Patient left: in chair;with call bell/phone within reach;with family/visitor present   PT Visit Diagnosis: Other abnormalities of gait and mobility (R26.89);Pain Pain - Right/Left: Right Pain - part of body: Hip     Time: [id number] PT Time Calculation (min) (ACUTE ONLY): 37 min  Charges:  $Gait Training: 8-22 mins $Therapeutic Exercise: 8-22 mins                       Doreatha Massed, PT Acute Rehabilitation  Office: [phone] Pager: [phone]

## 2021-03-31 NOTE — TOC Transition Note (Signed)
Transition of Care [facility]) - CM/SW Discharge Note   Patient Details  Name: Alexander [name] MRN: [id number] Date of Birth: Jun 05, 1951  [facility] CM/SW Contact:  Lennart Pall, LCSW Phone Number: 03/31/2021, 10:41 AM   Clinical Narrative:     Met briefly with pt and confirming he has all needed DME at home.  Plan for HHPT prearranged with [facility].  No further TOC needs.  Final next level of care: [place] Barriers to Discharge: No Barriers Identified   Patient Goals and CMS Choice Patient states their goals for this hospitalization and ongoing recovery are:: return home      Discharge Placement                       Discharge Plan and Services                DME Arranged: N/A DME Agency: NA       HH Arranged: PT [place] Agency: [place]        Social Determinants of Health (SDOH) Interventions     Readmission Risk Interventions No flowsheet data found.

## 2021-03-31 NOTE — Progress Notes (Signed)
  Subjective: Alexander [name] is a 70 y.o. male s/p right THA.  They are POD 1.  Pt's pain is moderate but controlled.  Pt has ambulated with some difficulty.  He was able to ambulate to the nursing station but was not able to ambulate back and had to sit down on the recliner and be wheeled back to his room.  Denies any lightheadedness or dizziness during ambulation.  No fevers or chills but he does report concern over temperature of 100.1.  He will increase his incentive spirometer use.  Denies any chest pain, shortness of breath, calf pain.  No other complaints aside from the hip pain.  Feels it is more painful than his previous hip surgery  Objective: Vital signs in last 24 hours: Temp:  [97.5 F (36.4 C)-100.1 F (37.8 C)] 100.1 F (37.8 C) (08/13 0952) Pulse Rate:  [43-84] 73 (08/13 0952) Resp:  [8-20] 17 (08/13 0952) BP: (128-159)/(75-100) 128/81 (08/13 0952) SpO2:  [92 %-100 %] 97 % (08/13 0952)  Intake/Output from previous day: 08/12 0701 - 08/13 0700 In: 3725 [P.O.:1350; I.V.:2100; IV Piggyback:275] Out: 1100 [Urine:850; Blood:250] Intake/Output this shift: Total I/O In: 360 [P.O.:360] Out: 0   Exam:  No gross blood or drainage overlying the dressing 2+ DP pulse Sensation intact distally in the right foot Able to dorsiflex and plantarflex the right foot No calf tenderness.   Labs: Recent Labs    03/31/21 0341  HGB 13.6   Recent Labs    03/31/21 0341  WBC 7.8  RBC 4.47  HCT 42.5  PLT 190   Recent Labs    03/31/21 0341  NA 139  K 3.5  CL 103  CO2 28  BUN 14  CREATININE 0.91  GLUCOSE 131*  CALCIUM 8.5*   No results for input(s): LABPT, INR in the last 72 hours.  Assessment/Plan: Pt is POD 1 s/p right THA.    -Plan to discharge to home today or tomorrow pending patient's pain and PT eval.  Patient is still having to take IV Dilaudid for pain control, if he is not able to have his pain better controlled, may need to consider staying overnight.  He  will attempt stairs with physical therapy in the afternoon  -WBAT with a walker  -Dr. [name] to see in the afternoon    [place] 03/31/2021, 11:26 AM

## 2021-03-31 NOTE — Progress Notes (Signed)
Physical Therapy Treatment Patient Details Name: Alexander [name] MRN: [id number] DOB: March 14, 1951 Today's Date: 03/31/2021    History of Present Illness Patient is 70 y.o. male s/p Rt THA anterior approach on 03/30/21 with PMH significant for OA, HTN, HLD, neck pain, OSA, CTR bil,  Lt THA on 12/09/19.    PT Comments    Progressing with mobility. Pt still reports mod-severe pain with activity. Will continue to progress activity as tolerated. Plan is for d/c home on tomorrow if pt does well.     Follow Up Recommendations  Follow surgeon's recommendation for DC plan and follow-up therapies;Supervision for mobility/OOB (plan if for HHPT f/u)     Equipment Recommendations  None recommended by PT    Recommendations for Other Services       Precautions / Restrictions Precautions Precautions: Fall Restrictions Weight Bearing Restrictions: No Other Position/Activity Restrictions: WBAT    Mobility  Bed Mobility Overal bed mobility: Needs Assistance Bed Mobility: Sit to Supine       Sit to supine: Min assist   General bed mobility comments: Assist for R LE. Cues provided. Increased time.    Transfers Overall transfer level: Needs assistance Equipment used: Rolling walker (2 wheeled) Transfers: Sit to/from Stand Sit to Stand: Min assist         General transfer comment: Assist to rise, steady, control descent. Cues for safety, technique, hand/LE placement.  Ambulation/Gait Ambulation/Gait assistance: Min guard Gait Distance (Feet): 75 Feet Assistive device: Rolling walker (2 wheeled) Gait Pattern/deviations: Step-to pattern;Step-through pattern;Decreased stride length;Decreased step length - right     General Gait Details: Min guard assist and step through patterns as distance increased. Pt denied dizzinesss. Slow but steady gait speed. Followed with recliner but did not need it. Distance remains limited by pain, fatigue.   Stairs             Wheelchair  Mobility    Modified Rankin (Stroke Patients Only)       Balance Overall balance assessment: Needs assistance         Standing balance support: Bilateral upper extremity supported Standing balance-Leahy Scale: Poor                              Cognition Arousal/Alertness: Awake/alert Behavior During Therapy: WFL for tasks assessed/performed Overall Cognitive Status: Within Functional Limits for tasks assessed                                        Exercises      General Comments        Pertinent Vitals/Pain Pain Assessment: 0-10 Pain Score: 9  Pain Location: Rt hip/thigh Pain Descriptors / Indicators: Sharp;Grimacing;Guarding;Discomfort Pain Intervention(s): Limited activity within patient's tolerance;Monitored during session;Repositioned    Home Living                      Prior Function            PT Goals (current goals can now be found in the care plan section) Progress towards PT goals: Progressing toward goals    Frequency    7X/week      PT Plan Current plan remains appropriate    Co-evaluation              AM-PAC PT "6 Clicks" Mobility   Outcome Measure  Help needed  turning from your back to your side while in a flat bed without using bedrails?: A Little Help needed moving from lying on your back to sitting on the side of a flat bed without using bedrails?: A Little Help needed moving to and from a bed to a chair (including a wheelchair)?: A Little Help needed standing up from a chair using your arms (e.g., wheelchair or bedside chair)?: A Little Help needed to walk in hospital room?: A Little Help needed climbing 3-5 steps with a railing? : A Lot 6 Click Score: 17    End of Session Equipment Utilized During Treatment: Gait belt Activity Tolerance: Patient limited by pain Patient left: in bed;with call bell/phone within reach;with bed alarm set;with family/visitor present   PT Visit  Diagnosis: Other abnormalities of gait and mobility (R26.89);Pain Pain - Right/Left: Right Pain - part of body: Hip     Time: [id number] PT Time Calculation (min) (ACUTE ONLY): 24 min  Charges:  $Gait Training: 23-37 mins                         Doreatha Massed, PT Acute Rehabilitation  Office: [phone] Pager: [phone]

## 2021-03-31 NOTE — Plan of Care (Signed)
  Problem: Education: Goal: Knowledge of the prescribed therapeutic regimen will improve Outcome: Progressing Goal: Understanding of discharge needs will improve Outcome: Progressing   Problem: Activity: Goal: Ability to avoid complications of mobility impairment will improve Outcome: Progressing Goal: Ability to tolerate increased activity will improve Outcome: Progressing   Problem: Pain Management: Goal: Pain level will decrease with appropriate interventions Outcome: Progressing   

## 2021-04-01 LAB — CBC
HCT: 37.4 % — ABNORMAL LOW (ref 39.0–52.0)
Hemoglobin: 12.2 g/dL — ABNORMAL LOW (ref 13.0–17.0)
MCH: 30.3 pg (ref 26.0–34.0)
MCHC: 32.6 g/dL (ref 30.0–36.0)
MCV: 93 fL (ref 80.0–100.0)
Platelets: 155 10*3/uL (ref 150–400)
RBC: 4.02 MIL/uL — ABNORMAL LOW (ref 4.22–5.81)
RDW: 15.3 % (ref 11.5–15.5)
WBC: 8.1 10*3/uL (ref 4.0–10.5)
nRBC: 0 % (ref 0.0–0.2)

## 2021-04-01 NOTE — Progress Notes (Signed)
Physical Therapy Treatment Patient Details Name: Alexander [name] MRN: [id number] DOB: 05/03/1951 Today's Date: 04/01/2021    History of Present Illness Patient is 70 y.o. male s/p Rt THA anterior approach on 03/30/21 with PMH significant for OA, HTN, HLD, neck pain, OSA, CTR bil,  Lt THA on 12/09/19.    PT Comments    Progressing with mobility. Reviewed/practiced exercises, gait training, and stair training. Pt rated pain7-8/10-reports burning on today. He also has a small fluid pocked on top right just above knee-encouraged him to keep an eye on it and to discuss with MD at f/u if still present or worsens.  All education completed. Okay to d/c from PT standpoint    Follow Up Recommendations  Follow surgeon's recommendation for DC plan and follow-up therapies;Supervision for mobility/OOB (plan is for HHPT)     Equipment Recommendations  None recommended by PT    Recommendations for Other Services       Precautions / Restrictions Precautions Precautions: Fall Restrictions Weight Bearing Restrictions: No Other Position/Activity Restrictions: WBAT    Mobility  Bed Mobility Overal bed mobility: Needs Assistance Bed Mobility: Supine to Sit     Supine to sit: Min guard     General bed mobility comments: Min guard. Increased time and effort. Cues provided.    Transfers Overall transfer level: Needs assistance Equipment used: Rolling walker (2 wheeled) Transfers: Sit to/from Stand Sit to Stand: Min guard         General transfer comment: Min guard for safety. Cues for safety, hand/LE placement. Increased time.  Ambulation/Gait Ambulation/Gait assistance: Min guard Gait Distance (Feet): 100 Feet Assistive device: Rolling walker (2 wheeled) Gait Pattern/deviations: Step-to pattern;Step-through pattern;Decreased stride length     General Gait Details: Cues for increased hip/knee flexion on R instead of sliding foot through. Slow but steady gait. Pt denied dizziness.  Followed with recliner but did not need it.   Stairs Stairs: Yes Stairs assistance: Min guard Stair Management: Forwards;Step to pattern;With walker Number of Stairs: 1 General stair comments: up and over 1 step. cues for safety, technique, sequence.   Wheelchair Mobility    Modified Rankin (Stroke Patients Only)       Balance Overall balance assessment: Needs assistance         Standing balance support: Bilateral upper extremity supported Standing balance-Leahy Scale: Poor                              Cognition Arousal/Alertness: Awake/alert Behavior During Therapy: WFL for tasks assessed/performed Overall Cognitive Status: Within Functional Limits for tasks assessed                                        Exercises Total Joint Exercises Ankle Circles/Pumps: AROM;Both;10 reps Quad Sets: AROM;Both;10 reps Heel Slides: AAROM;Right;10 reps Hip ABduction/ADduction: AAROM;Right;10 reps    General Comments        Pertinent Vitals/Pain Pain Assessment: 0-10 Pain Score: 7  Pain Location: Rt hip/thigh Pain Descriptors / Indicators: Grimacing;Guarding;Discomfort;Burning Pain Intervention(s): Limited activity within patient's tolerance;Monitored during session;Ice applied;Repositioned    Home Living                      Prior Function            PT Goals (current goals can now be found in the care plan section)  Progress towards PT goals: Progressing toward goals    Frequency    7X/week      PT Plan Current plan remains appropriate    Co-evaluation              AM-PAC PT "6 Clicks" Mobility   Outcome Measure  Help needed turning from your back to your side while in a flat bed without using bedrails?: A Little Help needed moving from lying on your back to sitting on the side of a flat bed without using bedrails?: A Little Help needed moving to and from a bed to a chair (including a wheelchair)?: A  Little Help needed standing up from a chair using your arms (e.g., wheelchair or bedside chair)?: A Little Help needed to walk in hospital room?: A Little Help needed climbing 3-5 steps with a railing? : A Little 6 Click Score: 18    End of Session Equipment Utilized During Treatment: Gait belt Activity Tolerance: Patient tolerated treatment well Patient left: in chair;with call bell/phone within reach;with family/visitor present   PT Visit Diagnosis: Other abnormalities of gait and mobility (R26.89) Pain - Right/Left: Right Pain - part of body: Hip     Time: [id number] PT Time Calculation (min) (ACUTE ONLY): 29 min  Charges:  $Gait Training: 8-22 mins $Therapeutic Exercise: 8-22 mins                         Doreatha Massed, PT Acute Rehabilitation  Office: [phone] Pager: [phone]

## 2021-04-01 NOTE — Progress Notes (Signed)
  Subjective: Patient stable.  Pain controlled.   Objective: Vital signs in last 24 hours: Temp:  [98.2 F (36.8 C)-99.5 F (37.5 C)] 99.5 F (37.5 C) (08/14 0615) Pulse Rate:  [68-69] 69 (08/14 0615) Resp:  [15-17] 15 (08/14 0615) BP: (103-106)/(74-87) 103/87 (08/14 0615) SpO2:  [96 %] 96 % (08/14 0615)  Intake/Output from previous day: 08/13 0701 - 08/14 0700 In: 1799.8 [P.O.:960; I.V.:839.8] Out: 1406 [Urine:1406] Intake/Output this shift: Total I/O In: 240 [P.O.:240] Out: 500 [Urine:500]  Exam:  Sensation intact distally Dorsiflexion/Plantar flexion intact  Labs: Recent Labs    03/31/21 0341 04/01/21 0308  HGB 13.6 12.2*   Recent Labs    03/31/21 0341 04/01/21 0308  WBC 7.8 8.1  RBC 4.47 4.02*  HCT 42.5 37.4*  PLT 190 155   Recent Labs    03/31/21 0341  NA 139  K 3.5  CL 103  CO2 28  BUN 14  CREATININE 0.91  GLUCOSE 131*  CALCIUM 8.5*   No results for input(s): LABPT, INR in the last 72 hours.  Assessment/Plan: Plan at this time is discharge to home.  Therapy for stair training today then discharge.   Alexander [name] 04/01/2021, 10:46 AM

## 2021-04-01 NOTE — Progress Notes (Signed)
Pt discharged to home. DC instructions given. No concerns voiced. Pt reminded to stop by pharmacy and pick up meds that were e-prescribed by Provider. Voiced understanding. Left unit in wheelchair pushed by nurse tech. Left in stable condition.

## 2021-04-01 NOTE — Anesthesia Postprocedure Evaluation (Signed)
Anesthesia Post Note  Patient: Alexander [name]  Procedure(s) Performed: RIGHT TOTAL HIP ARTHROPLASTY ANTERIOR APPROACH (Right: Hip)     Patient location during evaluation: PACU Anesthesia Type: MAC Level of consciousness: awake and alert Pain management: pain level controlled Vital Signs Assessment: post-procedure vital signs reviewed and stable Respiratory status: spontaneous breathing, nonlabored ventilation, respiratory function stable and patient connected to nasal cannula oxygen Cardiovascular status: stable and blood pressure returned to baseline Postop Assessment: no apparent nausea or vomiting Anesthetic complications: no   No notable events documented.  Last Vitals:  Vitals:   03/31/21 1310 04/01/21 0615  BP: 106/74 103/87  Pulse: 68 69  Resp: 17 15  Temp: 37.2 C 37.5 C  SpO2: 96% 96%    Last Pain:  Vitals:   04/01/21 0945  TempSrc:   PainSc: 4    Pain Goal: Patients Stated Pain Goal: 4 (04/01/21 0858)                 Catcher Dehoyos

## 2021-04-02 ENCOUNTER — Encounter (HOSPITAL_COMMUNITY): Payer: Self-pay | Admitting: Orthopaedic Surgery

## 2021-04-02 ENCOUNTER — Telehealth: Payer: Self-pay | Admitting: *Deleted

## 2021-04-02 NOTE — Telephone Encounter (Signed)
Ortho bundle D/C call completed. 

## 2021-04-02 NOTE — Discharge Summary (Signed)
Patient ID: Alexander [name] MRN: [id number] DOB/AGE: Oct 22, 1950 70 y.o.  Admit date: 03/30/2021 Discharge date: 04/01/2021  Admission Diagnoses:  Principal Problem:   Unilateral primary osteoarthritis, right hip Active Problems:   Status post total replacement of right hip   Discharge Diagnoses:  Same  Past Medical History:  Diagnosis Date   Allergy    Cancer ([place])    skin cancer to upper chest/basal cell 2007   Chronic neck pain    DJD   Chronic pain in right foot    DDD (degenerative disc disease), lumbar    Dyslipidemia    Erectile dysfunction    Exogenous obesity    H/O acquired cardiomyopathy 1996   RESOLVED (apparently was thought to have had an MI, but did not have any intervention).  He developed cardiomyopathy that forced him to retire from [employer]..  The current EF 60-65%.   History of fracture due to fall 1988   Bilateral wrist fractures   Hyperlipidemia    Hypertension    Hypogonadism male    IBS (irritable bowel syndrome)    Obstructive sleep apnea    Pre-diabetes    Sleep apnea    CPAP nightly    Surgeries: Procedure(s): RIGHT TOTAL HIP ARTHROPLASTY ANTERIOR APPROACH on 03/30/2021   Consultants:   Discharged Condition: Improved  Hospital Course: TARICK STANCIL is an 70 y.o. male who was admitted 03/30/2021 for operative treatment ofUnilateral primary osteoarthritis, right hip. Patient has severe unremitting pain that affects sleep, daily activities, and work/hobbies. After pre-op clearance the patient was taken to the operating room on 03/30/2021 and underwent  Procedure(s): RIGHT TOTAL HIP ARTHROPLASTY ANTERIOR APPROACH.    Patient was given perioperative antibiotics:  Anti-infectives (From admission, onward)    Start     Dose/Rate Route Frequency Ordered Stop   03/30/21 1400  ceFAZolin (ANCEF) IVPB 1 g/50 mL premix        1 g 100 mL/hr over 30 Minutes Intravenous Every 6 hours 03/30/21 1252 03/31/21 0822   03/30/21 0615  ceFAZolin  (ANCEF) IVPB 2g/100 mL premix        2 g 200 mL/hr over 30 Minutes Intravenous On call to O.R. 03/30/21 0601 03/30/21 0845        Patient was given sequential compression devices, early ambulation, and chemoprophylaxis to prevent DVT.  Patient benefited maximally from hospital stay and there were no complications.    Recent vital signs: No data found.   Recent laboratory studies:  Recent Labs    03/31/21 0341 04/01/21 0308  WBC 7.8 8.1  HGB 13.6 12.2*  HCT 42.5 37.4*  PLT 190 155  NA 139  --   K 3.5  --   CL 103  --   CO2 28  --   BUN 14  --   CREATININE 0.91  --   GLUCOSE 131*  --   CALCIUM 8.5*  --      Discharge Medications:   Allergies as of 04/01/2021   No Known Allergies      Medication List     STOP taking these medications    aspirin EC 81 MG tablet Replaced by: aspirin 81 MG chewable tablet       TAKE these medications    aspirin 81 MG chewable tablet Chew 1 tablet (81 mg total) by mouth 2 (two) times daily. Replaces: aspirin EC 81 MG tablet   CALCIUM 600/VITAMIN D PO Take 1 tablet by mouth daily.   carvedilol 12.5 MG tablet  Commonly known as: COREG TAKE 1 TABLET TWICE A DAY AS WELL AS 6.25 MG TABLET TWICE A DAY (TOTAL OF 18.75 MG)   losartan-hydrochlorothiazide 100-25 MG tablet Commonly known as: HYZAAR TAKE 1 TABLET DAILY   metFORMIN 500 MG 24 hr tablet Commonly known as: GLUCOPHAGE-XR Take 500 mg by mouth daily with supper.   methocarbamol 500 MG tablet Commonly known as: ROBAXIN Take 1 tablet (500 mg total) by mouth every 6 (six) hours as needed for muscle spasms. What changed: Another medication with the same name was added. Make sure you understand how and when to take each.   methocarbamol 500 MG tablet Commonly known as: ROBAXIN Take 1 tablet (500 mg total) by mouth every 6 (six) hours as needed for muscle spasms. What changed: You were already taking a medication with the same name, and this prescription was added. Make  sure you understand how and when to take each.   modafinil 200 MG tablet Commonly known as: PROVIGIL TAKE 1 TABLET DAILY (HAS APPOINTMENT 07-17-20) What changed: See the new instructions.   multivitamin with minerals Tabs tablet Take 1 tablet by mouth daily.   oxyCODONE 5 MG immediate release tablet Commonly known as: Oxy IR/ROXICODONE Take 1-2 tablets (5-10 mg total) by mouth every 6 (six) hours as needed for moderate pain (pain score 4-6).   simvastatin 20 MG tablet Commonly known as: ZOCOR TAKE 1 TABLET DAILY What changed: when to take this   Testosterone 10 MG/ACT (2%) Gel Place 3 Pump onto the skin daily. On each inner thigh   topiramate 25 MG tablet Commonly known as: TOPAMAX Take 12.5 mg by mouth at bedtime.   vitamin B-12 1000 MCG tablet Commonly known as: CYANOCOBALAMIN Take 3,000 mcg by mouth daily.        Diagnostic Studies: DG Pelvis Portable  Result Date: 03/30/2021 CLINICAL DATA:  Right hip total arthroplasty EXAM: PORTABLE PELVIS 1-2 VIEWS; OPERATIVE RIGHT HIP WITH PELVIS COMPARISON:  12/08/2020 FLUOROSCOPY TIME:  20 seconds FINDINGS: Intraoperative fluoroscopic images demonstrate right hip total arthroplasty. Postoperative radiographs demonstrate expected overlying postoperative changes. No evidence of perihardware fracture or component loosening. Pre-existing left hip total arthroplasty. IMPRESSION: 1. Intraoperative fluoroscopic images demonstrate right hip total arthroplasty. 2. Postoperative radiographs demonstrate expected overlying postoperative changes. No evidence of perihardware fracture or component loosening. Electronically Signed   By: Eddie Candle M.D.   On: 03/30/2021 11:36   DG C-Arm 1-60 Min-No Report  Result Date: 03/30/2021 Fluoroscopy was utilized by the requesting physician.  No radiographic interpretation.   DG HIP OPERATIVE UNILAT W OR W/O PELVIS RIGHT  Result Date: 03/30/2021 CLINICAL DATA:  Right hip total arthroplasty EXAM: PORTABLE  PELVIS 1-2 VIEWS; OPERATIVE RIGHT HIP WITH PELVIS COMPARISON:  12/08/2020 FLUOROSCOPY TIME:  20 seconds FINDINGS: Intraoperative fluoroscopic images demonstrate right hip total arthroplasty. Postoperative radiographs demonstrate expected overlying postoperative changes. No evidence of perihardware fracture or component loosening. Pre-existing left hip total arthroplasty. IMPRESSION: 1. Intraoperative fluoroscopic images demonstrate right hip total arthroplasty. 2. Postoperative radiographs demonstrate expected overlying postoperative changes. No evidence of perihardware fracture or component loosening. Electronically Signed   By: Eddie Candle M.D.   On: 03/30/2021 11:36    Disposition: Discharge disposition: 01-Home or Self Care       Discharge Instructions     Call MD / Call 911   Complete by: As directed    If you experience chest pain or shortness of breath, CALL 911 and be transported to the hospital emergency room.  If you  develope a fever above 101 F, pus (white drainage) or increased drainage or redness at the wound, or calf pain, call your surgeon's office.   Constipation Prevention   Complete by: As directed    Drink plenty of fluids.  Prune juice may be helpful.  You may use a stool softener, such as Colace (over the counter) 100 mg twice a day.  Use MiraLax (over the counter) for constipation as needed.   Diet - low sodium heart healthy   Complete by: As directed    Increase activity slowly as tolerated   Complete by: As directed    Post-operative opioid taper instructions:   Complete by: As directed    POST-OPERATIVE OPIOID TAPER INSTRUCTIONS: It is important to wean off of your opioid medication as soon as possible. If you do not need pain medication after your surgery it is ok to stop day one. Opioids include: Codeine, Hydrocodone(Norco, Vicodin), Oxycodone(Percocet, oxycontin) and hydromorphone amongst others.  Long term and even short term use of opiods can  cause: Increased pain response Dependence Constipation Depression Respiratory depression And more.  Withdrawal symptoms can include Flu like symptoms Nausea, vomiting And more Techniques to manage these symptoms Hydrate well Eat regular healthy meals Stay active Use relaxation techniques(deep breathing, meditating, yoga) Do Not substitute Alcohol to help with tapering If you have been on opioids for less than two weeks and do not have pain than it is ok to stop all together.  Plan to wean off of opioids This plan should start within one week post op of your joint replacement. Maintain the same interval or time between taking each dose and first decrease the dose.  Cut the total daily intake of opioids by one tablet each day Next start to increase the time between doses. The last dose that should be eliminated is the evening dose.           Follow-up Information     Mcarthur Rossetti, MD. Go on 04/12/2021.   Specialty: Orthopedic Surgery Why: at 1:30 pm for your 2 week post op appointment with Dr. Clarita Leber information: [place] [address] [phone]                  Signed: Mcarthur Rossetti 04/02/2021, 11:38 AM

## 2021-04-03 ENCOUNTER — Telehealth: Payer: Self-pay | Admitting: Physician Assistant

## 2021-04-03 DIAGNOSIS — G4733 Obstructive sleep apnea (adult) (pediatric): Secondary | ICD-10-CM | POA: Diagnosis not present

## 2021-04-03 DIAGNOSIS — G8929 Other chronic pain: Secondary | ICD-10-CM | POA: Diagnosis not present

## 2021-04-03 DIAGNOSIS — R7303 Prediabetes: Secondary | ICD-10-CM | POA: Diagnosis not present

## 2021-04-03 DIAGNOSIS — N529 Male erectile dysfunction, unspecified: Secondary | ICD-10-CM | POA: Diagnosis not present

## 2021-04-03 DIAGNOSIS — G4753 Recurrent isolated sleep paralysis: Secondary | ICD-10-CM | POA: Diagnosis not present

## 2021-04-03 DIAGNOSIS — E785 Hyperlipidemia, unspecified: Secondary | ICD-10-CM | POA: Diagnosis not present

## 2021-04-03 DIAGNOSIS — Z96641 Presence of right artificial hip joint: Secondary | ICD-10-CM | POA: Diagnosis not present

## 2021-04-03 DIAGNOSIS — Z6833 Body mass index (BMI) 33.0-33.9, adult: Secondary | ICD-10-CM | POA: Diagnosis not present

## 2021-04-03 DIAGNOSIS — I10 Essential (primary) hypertension: Secondary | ICD-10-CM | POA: Diagnosis not present

## 2021-04-03 DIAGNOSIS — Z471 Aftercare following joint replacement surgery: Secondary | ICD-10-CM | POA: Diagnosis not present

## 2021-04-03 DIAGNOSIS — M5136 Other intervertebral disc degeneration, lumbar region: Secondary | ICD-10-CM | POA: Diagnosis not present

## 2021-04-03 DIAGNOSIS — Z7982 Long term (current) use of aspirin: Secondary | ICD-10-CM | POA: Diagnosis not present

## 2021-04-03 DIAGNOSIS — Z85828 Personal history of other malignant neoplasm of skin: Secondary | ICD-10-CM | POA: Diagnosis not present

## 2021-04-03 DIAGNOSIS — Z7984 Long term (current) use of oral hypoglycemic drugs: Secondary | ICD-10-CM | POA: Diagnosis not present

## 2021-04-03 DIAGNOSIS — G47411 Narcolepsy with cataplexy: Secondary | ICD-10-CM | POA: Diagnosis not present

## 2021-04-03 DIAGNOSIS — K589 Irritable bowel syndrome without diarrhea: Secondary | ICD-10-CM | POA: Diagnosis not present

## 2021-04-03 DIAGNOSIS — E291 Testicular hypofunction: Secondary | ICD-10-CM | POA: Diagnosis not present

## 2021-04-03 NOTE — Telephone Encounter (Signed)
[facility] called requesting orders: 3 time a week for 2 weeks. She wants to know if [name] apart of the bundle CB [phone]

## 2021-04-03 NOTE — Telephone Encounter (Signed)
Is it ok to give verbal orders 

## 2021-04-03 NOTE — Telephone Encounter (Signed)
Is pt a bundle pt?

## 2021-04-06 ENCOUNTER — Telehealth: Payer: Self-pay | Admitting: *Deleted

## 2021-04-06 DIAGNOSIS — M5136 Other intervertebral disc degeneration, lumbar region: Secondary | ICD-10-CM | POA: Diagnosis not present

## 2021-04-06 DIAGNOSIS — Z471 Aftercare following joint replacement surgery: Secondary | ICD-10-CM | POA: Diagnosis not present

## 2021-04-06 DIAGNOSIS — I10 Essential (primary) hypertension: Secondary | ICD-10-CM | POA: Diagnosis not present

## 2021-04-06 DIAGNOSIS — K589 Irritable bowel syndrome without diarrhea: Secondary | ICD-10-CM | POA: Diagnosis not present

## 2021-04-06 DIAGNOSIS — G4733 Obstructive sleep apnea (adult) (pediatric): Secondary | ICD-10-CM | POA: Diagnosis not present

## 2021-04-06 DIAGNOSIS — E785 Hyperlipidemia, unspecified: Secondary | ICD-10-CM | POA: Diagnosis not present

## 2021-04-06 NOTE — Telephone Encounter (Signed)
Ortho bundle 7 day call completed. 

## 2021-04-07 DIAGNOSIS — E785 Hyperlipidemia, unspecified: Secondary | ICD-10-CM | POA: Diagnosis not present

## 2021-04-07 DIAGNOSIS — K589 Irritable bowel syndrome without diarrhea: Secondary | ICD-10-CM | POA: Diagnosis not present

## 2021-04-07 DIAGNOSIS — I10 Essential (primary) hypertension: Secondary | ICD-10-CM | POA: Diagnosis not present

## 2021-04-07 DIAGNOSIS — M5136 Other intervertebral disc degeneration, lumbar region: Secondary | ICD-10-CM | POA: Diagnosis not present

## 2021-04-07 DIAGNOSIS — Z471 Aftercare following joint replacement surgery: Secondary | ICD-10-CM | POA: Diagnosis not present

## 2021-04-07 DIAGNOSIS — G4733 Obstructive sleep apnea (adult) (pediatric): Secondary | ICD-10-CM | POA: Diagnosis not present

## 2021-04-09 DIAGNOSIS — G4733 Obstructive sleep apnea (adult) (pediatric): Secondary | ICD-10-CM | POA: Diagnosis not present

## 2021-04-09 DIAGNOSIS — K589 Irritable bowel syndrome without diarrhea: Secondary | ICD-10-CM | POA: Diagnosis not present

## 2021-04-09 DIAGNOSIS — I10 Essential (primary) hypertension: Secondary | ICD-10-CM | POA: Diagnosis not present

## 2021-04-09 DIAGNOSIS — E785 Hyperlipidemia, unspecified: Secondary | ICD-10-CM | POA: Diagnosis not present

## 2021-04-09 DIAGNOSIS — M5136 Other intervertebral disc degeneration, lumbar region: Secondary | ICD-10-CM | POA: Diagnosis not present

## 2021-04-09 DIAGNOSIS — Z471 Aftercare following joint replacement surgery: Secondary | ICD-10-CM | POA: Diagnosis not present

## 2021-04-11 DIAGNOSIS — M5136 Other intervertebral disc degeneration, lumbar region: Secondary | ICD-10-CM | POA: Diagnosis not present

## 2021-04-11 DIAGNOSIS — G4733 Obstructive sleep apnea (adult) (pediatric): Secondary | ICD-10-CM | POA: Diagnosis not present

## 2021-04-11 DIAGNOSIS — K589 Irritable bowel syndrome without diarrhea: Secondary | ICD-10-CM | POA: Diagnosis not present

## 2021-04-11 DIAGNOSIS — I10 Essential (primary) hypertension: Secondary | ICD-10-CM | POA: Diagnosis not present

## 2021-04-11 DIAGNOSIS — E785 Hyperlipidemia, unspecified: Secondary | ICD-10-CM | POA: Diagnosis not present

## 2021-04-11 DIAGNOSIS — Z471 Aftercare following joint replacement surgery: Secondary | ICD-10-CM | POA: Diagnosis not present

## 2021-04-12 ENCOUNTER — Ambulatory Visit (INDEPENDENT_AMBULATORY_CARE_PROVIDER_SITE_OTHER): Payer: Medicare Other | Admitting: Physician Assistant

## 2021-04-12 ENCOUNTER — Encounter: Payer: Self-pay | Admitting: Physician Assistant

## 2021-04-12 ENCOUNTER — Telehealth: Payer: Self-pay | Admitting: *Deleted

## 2021-04-12 DIAGNOSIS — Z96641 Presence of right artificial hip joint: Secondary | ICD-10-CM

## 2021-04-12 MED ORDER — OXYCODONE HCL 5 MG PO TABS: 5.0000 mg | ORAL_TABLET | Freq: Four times a day (QID) | ORAL | 0 refills | Status: DC | PRN

## 2021-04-12 NOTE — Telephone Encounter (Signed)
Ortho bundle 14 day call completed. 

## 2021-04-12 NOTE — Progress Notes (Signed)
   HPI: [name] returns today 2 weeks status post right total hip arthroplasty.  He still having some tenderness around his IT band.  He is taking 1 oxycodone every 6 hours for pain.  No fevers chills shortness of breath.  He is on 81 mg aspirin twice daily.  Prior to surgery he was on one 81 mg aspirin daily.  Physical exam: Right hip surgical incisions healing well.  He does have some fibrinous tissue within the wound is not completely healed proximal portion of the incision.  There is no signs of infection.  No drainage.  Right calf supple nontender.  Dorsiflexion plantarflexion right ankle intact.  Impression: Status post right total hip arthroplasty 03/30/2021  Plan: Most proximal staples were left in place.  He will wash the entire incision with antibacterial soap and applying just a small amount of Bactroban which is given to him top portion of the incision.  He may require placing gauze in the proximal incision region if he develops any maceration.  He will go back on his 81 mg aspirin he was taken prior to surgery once daily.  We will see him back in 1 week for recheck of his wound and removal of the remaining staples proximal incision. Questions were encouraged and answered at length

## 2021-04-12 NOTE — Telephone Encounter (Signed)
Duplicate call. Error

## 2021-04-13 DIAGNOSIS — G4733 Obstructive sleep apnea (adult) (pediatric): Secondary | ICD-10-CM | POA: Diagnosis not present

## 2021-04-13 DIAGNOSIS — I10 Essential (primary) hypertension: Secondary | ICD-10-CM | POA: Diagnosis not present

## 2021-04-13 DIAGNOSIS — E785 Hyperlipidemia, unspecified: Secondary | ICD-10-CM | POA: Diagnosis not present

## 2021-04-13 DIAGNOSIS — Z471 Aftercare following joint replacement surgery: Secondary | ICD-10-CM | POA: Diagnosis not present

## 2021-04-13 DIAGNOSIS — K589 Irritable bowel syndrome without diarrhea: Secondary | ICD-10-CM | POA: Diagnosis not present

## 2021-04-13 DIAGNOSIS — M5136 Other intervertebral disc degeneration, lumbar region: Secondary | ICD-10-CM | POA: Diagnosis not present

## 2021-04-19 ENCOUNTER — Encounter: Payer: Medicare Other | Admitting: Physician Assistant

## 2021-04-19 ENCOUNTER — Encounter: Payer: Self-pay | Admitting: Physician Assistant

## 2021-04-19 ENCOUNTER — Ambulatory Visit (INDEPENDENT_AMBULATORY_CARE_PROVIDER_SITE_OTHER): Payer: Medicare Other | Admitting: Physician Assistant

## 2021-04-19 ENCOUNTER — Other Ambulatory Visit: Payer: Self-pay

## 2021-04-19 DIAGNOSIS — Z96641 Presence of right artificial hip joint: Secondary | ICD-10-CM

## 2021-04-19 NOTE — Progress Notes (Signed)
HPI: Mr. Duchow returns today in follow-up of his right total hip arthroplasty incision.  Again he underwent a right total hip arthroplasty on 03/30/2021.  He states overall he is doing well.  He is taking his 1 oxycodone daily.  Denies any fevers chills.  Physical exam: Right hip pain surgical incision approximately slight dehiscence.  There is fibrinous tissue in the region.  Remainder the incision is well-healed.  There is no signs of infection.  He ambulates with use of cane with a nonantalgic gait.  He is able to get on and off the exam table on his own easily.  Impression: Status post right total hip arthroplasty 03/30/2021  Plan: Staples removed.  He will continue washing the area with antibacterial soap and a small amount of Bactroban.  Keep area clean and dry.  We will see him back in 2 weeks for wound check sooner if there is any questions or concerns.

## 2021-05-03 ENCOUNTER — Encounter: Payer: Self-pay | Admitting: Physician Assistant

## 2021-05-03 ENCOUNTER — Other Ambulatory Visit: Payer: Self-pay

## 2021-05-03 ENCOUNTER — Ambulatory Visit (INDEPENDENT_AMBULATORY_CARE_PROVIDER_SITE_OTHER): Payer: Medicare Other | Admitting: Physician Assistant

## 2021-05-03 DIAGNOSIS — Z96641 Presence of right artificial hip joint: Secondary | ICD-10-CM

## 2021-05-03 NOTE — Progress Notes (Signed)
Office Visit Note   Patient: Alexander [name]           Date of Birth: 07/22/1951           MRN: [id number] Visit Date: 05/03/2021              Requested by: Leanna Battles, MD [address] [place],  [place] [address] PCP: Leanna Battles, MD   Assessment & Plan: Visit Diagnoses:  1. Status post total replacement of right hip     Plan: We will see him back in 2 weeks to see how his wound is healing and possibly remove the sutures from the proximal incision.  Tegaderm was placed over the incision today after 2 interrupted 3 -0 nylon stitches were placed in the proximal incision.  Xeroform was also placed over the wound healing visit place for the next 3 to 4 days.  After that he can remove this begin washing the area with antibacterial soap keep it clean and dry.  He does not need to apply back to trace Neosporin or anything else over the wound needs to keep it dry.  He will follow-up sooner if there is any questions concerns.  He tolerated was placed on the sutures today.  Follow-Up Instructions: No follow-ups on file.   Orders:  No orders of the defined types were placed in this encounter.  No orders of the defined types were placed in this encounter.     Procedures: No procedures performed   Clinical Data: No additional findings.   Subjective: Chief Complaint  Patient presents with   Right Hip - Routine Post Op    HPI Alexander [name] returns today for follow-up of his right hip status post right total hip arthroplasty 03/30/2021.  He is still having issue with the wound opening at the proximal end of the incision.  He has had no fevers or chills.  He just feels that the incision is not healing and keeps breaking it open.  Right hip overall is doing well and he has no complaints. Review of Systems See HPI.  Objective: Vital Signs: There were no vitals taken for this visit.  Physical Exam General well-developed well-nourished male who ambulates without assistive  device and a nonantalgic gait. Ortho Exam Right hip good range of motion without pain.  Proximal incision small area of wound dehiscence with fibrinous tissue no malodor no signs of infection.  Remainder the incision is healed well. Specialty Comments:  No specialty comments available.  Imaging: No results found.   PMFS History: Patient Active Problem List   Diagnosis Date Noted   Status post total replacement of right hip 03/30/2021   Status post left hip replacement 12/08/2020   Unilateral primary osteoarthritis, left hip 10/16/2020   Unilateral primary osteoarthritis, right hip 10/16/2020   Hyperlipidemia due to dietary fat intake 10/01/2019   Morbid obesity ([place]) 10/01/2019   Excessive daytime sleepiness 03/03/2018   Obstructive sleep apnea treated with bilevel positive airway pressure (BiPAP) 03/03/2018   Sleep paralysis, recurrent isolated 03/03/2018   Cataplexy 03/03/2018   Abnormal dreams 03/03/2018   Breathholding 03/03/2018   Hypersomnia, persistent 09/03/2017   Exertional dyspnea 06/20/2017   Vasovagal syncope 06/20/2017   Essential hypertension 06/20/2017   History of cardiomyopathy 06/20/2017   Past Medical History:  Diagnosis Date   Allergy    Cancer ([place])    skin cancer to upper chest/basal cell 2007   Chronic neck pain    DJD   Chronic pain in right  foot    DDD (degenerative disc disease), lumbar    Dyslipidemia    Erectile dysfunction    Exogenous obesity    H/O acquired cardiomyopathy 1996   RESOLVED (apparently was thought to have had an MI, but did not have any intervention).  He developed cardiomyopathy that forced him to retire from [employer]..  The current EF 60-65%.   History of fracture due to fall 1988   Bilateral wrist fractures   Hyperlipidemia    Hypertension    Hypogonadism male    IBS (irritable bowel syndrome)    Obstructive sleep apnea    Pre-diabetes    Sleep apnea    CPAP nightly    Family History  Problem Relation Age of  Onset   Cancer Mother        duodenal CA   Heart disease Mother    Depression Mother    Stomach cancer Mother    Cancer Father    Hypertension Father    High Cholesterol Father    Heart disease Father    Diabetes Father    Hypertension Sister    High Cholesterol Sister    Heart disease Sister    Esophageal cancer Neg Hx    Rectal cancer Neg Hx    Colon cancer Neg Hx     Past Surgical History:  Procedure Laterality Date   CARDIAC CATHETERIZATION  2000   Nonobstructive disease.  Persistently reduced EF   CARPAL TUNNEL RELEASE Left    CARPAL TUNNEL RELEASE Right 01/05/2019   Procedure: RIGHT CARPAL TUNNEL RELEASE;  Surgeon: Leanora Cover, MD;  Location: [facility];  Service: Orthopedics;  Laterality: Right;  Bier block   COLONOSCOPY     CORONARY CT ANGIOGRAM  07/2017   Coronary calcium score 0?.  Focal noncalcific plaque (30%) in proximal LAD.  Otherwise no significant CAD.  Normal aortic root (3.2 cm)   HAMMER TOE SURGERY Right    ingrown toenail surgery Bilateral    2017   IR RADIOLOGY PERIPHERAL GUIDED IV START  07/25/2017   IR US GUIDE VASC ACCESS RIGHT  07/25/2017   MENISCUS REPAIR     ORIF WRIST FRACTURE Bilateral    PLANTAR FASCIA RELEASE Right    SHOULDER ARTHROSCOPY Left    TENDON REPAIR Right    right quad tendon repair   Tilt Table Test     Noted to have vasovagal syncope   TOTAL HIP ARTHROPLASTY Left 12/08/2020   Procedure: LEFT TOTAL HIP ARTHROPLASTY ANTERIOR APPROACH;  Surgeon: Mcarthur Rossetti, MD;  Location: [facility];  Service: Orthopedics;  Laterality: Left;  Needs RNFA   TOTAL HIP ARTHROPLASTY Right 03/30/2021   Procedure: RIGHT TOTAL HIP ARTHROPLASTY ANTERIOR APPROACH;  Surgeon: Mcarthur Rossetti, MD;  Location: [facility];  Service: Orthopedics;  Laterality: Right;   TRANSTHORACIC ECHOCARDIOGRAM  06/21/2015   Normal LV function.  EF 57%.  Mild left atrial enlargement, trace AI, trace MR, mild PI.   TRANSTHORACIC ECHOCARDIOGRAM   06/2017   EF 60-65%.  Mild diastolic dysfunction.  No significant valvular abnormality.  Mild aortic calcification.   WISDOM TOOTH EXTRACTION Bilateral    Social History   Occupational History   Not on file  Tobacco Use   Smoking status: Former    Types: Cigarettes    Quit date: 1996    Years since quitting: 26.7   Smokeless tobacco: Never  Vaping Use   Vaping Use: Never used  Substance and Sexual Activity   Alcohol  use: Yes    Alcohol/week: 2.0 standard drinks    Types: 2 Standard drinks or equivalent per week    Comment: social   Drug use: No   Sexual activity: Not on file

## 2021-05-14 ENCOUNTER — Encounter: Payer: Self-pay | Admitting: Gastroenterology

## 2021-05-17 ENCOUNTER — Other Ambulatory Visit: Payer: Self-pay | Admitting: Physician Assistant

## 2021-05-17 ENCOUNTER — Encounter: Payer: Self-pay | Admitting: Physician Assistant

## 2021-05-17 ENCOUNTER — Other Ambulatory Visit: Payer: Self-pay

## 2021-05-17 ENCOUNTER — Ambulatory Visit (INDEPENDENT_AMBULATORY_CARE_PROVIDER_SITE_OTHER): Payer: Medicare Other | Admitting: Physician Assistant

## 2021-05-17 DIAGNOSIS — Z96641 Presence of right artificial hip joint: Secondary | ICD-10-CM

## 2021-05-17 MED ORDER — HYDROCODONE-ACETAMINOPHEN 5-325 MG PO TABS: 1.0000 | ORAL_TABLET | Freq: Four times a day (QID) | ORAL | 0 refills | Status: DC | PRN

## 2021-05-17 NOTE — Progress Notes (Signed)
HPI: Mr. Berrios returns today follow-up of his right total hip arthroplasty incision.  Again he underwent a right total hip arthroplasty on 03/30/2021. Had slight dehiscence of the proximal wound which was closed with 2-0 nylon sutures at last office visit.  He has had no fevers chills.  He is overall doing well.  He is asking for refill on his pain medicine he has been on Percocet he takes this only occasionally.  Particularly after yard work push mowing.  Physical exam: Right hip: Surgical incisions healing well the proximal wound , two sutures have closed the proximal incision.  There is slight area of fibrinous like tissue.  But no significant depth to the wound.  No signs of infection.  Good range of motion of the right hip without pain.  Dorsiflexion plantarflexion right ankle intact.  Impression: Status post right total hip arthroplasty 03/30/2021  Plan: He will just wash the area with antibacterial soap he does not need to apply any ointments or creams to the area.  Continue to work on scar tissue mobilization.  Follow-up with [place] in 1 month sooner if there is any questions concerns.  No radiographs at next office visit unless clinically indicated.

## 2021-05-18 ENCOUNTER — Other Ambulatory Visit: Payer: Self-pay | Admitting: Adult Health

## 2021-05-21 NOTE — Telephone Encounter (Signed)
Pt is due for a refill on modafinil. Pt is up to date on his appointments. [place] Controlled Substance Registry checked and is appropriate.

## 2021-06-14 ENCOUNTER — Other Ambulatory Visit: Payer: Self-pay

## 2021-06-14 ENCOUNTER — Encounter: Payer: Self-pay | Admitting: Physician Assistant

## 2021-06-14 ENCOUNTER — Ambulatory Visit (INDEPENDENT_AMBULATORY_CARE_PROVIDER_SITE_OTHER): Payer: Medicare Other | Admitting: Physician Assistant

## 2021-06-14 DIAGNOSIS — Z96641 Presence of right artificial hip joint: Secondary | ICD-10-CM

## 2021-06-14 NOTE — Progress Notes (Signed)
HPI: Alexander [name] returns today for follow-up of his right total hip arthroplasty.  He is overall doing well.  He has no concerns.  He states he has occasional pain in the anterior aspect of both thighs but this is very rare.  Physical exam: Right hip excellent range of motion without pain.  Right hip surgical incisions well-healed without any signs of infection or dehiscence.  Right calf supple nontender.  Dorsiflexion plantarflexion right ankle intact.  Ambulates without any assistive device.  Impression: Status post right total hip arthroplasty 03/30/2021  Plan: We will have him follow-up with [place] in a year postop sooner if there is any questions or concerns.  At that time we will obtain an AP pelvis and lateral view of the both hips.  As he underwent a left total hip arthroplasty 12/08/2020

## 2021-06-19 ENCOUNTER — Encounter: Payer: Self-pay | Admitting: Gastroenterology

## 2021-07-09 ENCOUNTER — Telehealth: Payer: Self-pay | Admitting: *Deleted

## 2021-07-09 NOTE — Telephone Encounter (Signed)
(  Late entry for 05/17/21)- 30 day Ortho bundle call completed.

## 2021-07-10 ENCOUNTER — Telehealth: Payer: Self-pay | Admitting: *Deleted

## 2021-07-10 NOTE — Telephone Encounter (Signed)
Ortho bundle 90 day call completed for Right total hip arthroplasty.

## 2021-07-17 ENCOUNTER — Telehealth (INDEPENDENT_AMBULATORY_CARE_PROVIDER_SITE_OTHER): Payer: Medicare Other | Admitting: Adult Health

## 2021-07-17 DIAGNOSIS — Z9989 Dependence on other enabling machines and devices: Secondary | ICD-10-CM | POA: Diagnosis not present

## 2021-07-17 DIAGNOSIS — G4733 Obstructive sleep apnea (adult) (pediatric): Secondary | ICD-10-CM

## 2021-07-17 DIAGNOSIS — G47411 Narcolepsy with cataplexy: Secondary | ICD-10-CM

## 2021-07-17 NOTE — Progress Notes (Signed)
PATIENT: Alexander [name] DOB: 1950/10/25  REASON FOR VISIT: follow up HISTORY FROM: patient PRIMARY NEUROLOGIST:   Virtual Visit via Video Note  I connected with Alexander [name] on 07/17/21 at  1:15 PM EST by a video enabled telemedicine application located remotely at [facility] and verified that I am speaking with the correct person using two identifiers who was located at their own home.   I discussed the limitations of evaluation and management by telemedicine and the availability of in person appointments. The patient expressed understanding and agreed to proceed.   PATIENT: Alexander [name] DOB: 1951/01/10  REASON FOR VISIT: follow up HISTORY FROM: patient  HISTORY OF PRESENT ILLNESS: Today 07/17/21: Mr. Laminack is a 70 year old male with a history of narcolepsy and obstructive sleep apnea on CPAP.  His download is attached.  He reports that CPAP is working well for him.  He denies any new issues.  Continues on modafinil 200 mg daily.   HISTORY 07/16/20:   Mr. Kadlec is a 70 year old male with a history of narcolepsy and obstructive sleep apnea on CPAP.  His download indicates that he uses machine nightly for compliance of 100%.  He uses machine greater than 4 hours 28 days for compliance of 93%.  On average he uses his machine 5 hours and 53 minutes.  His residual AHI is 3.5 on 512 cm of water with EPR of 2.  Leak in the 95th percentile is 18.2 L/min.  Reports that the CPAP is working well for him.  He denies any new issues.   He remains on modafinil 200 mg daily.  Reports that this continues to work well for him.  Reports that when he sneezes he feels a slight loss of muscle tone but does not fall to the ground.  Continues to operate a motor vehicle.  No issues falling asleep while driving.  He returns today for an evaluation.  REVIEW OF SYSTEMS: Out of a complete 14 system review of symptoms, the patient complains only of the following  symptoms, and all other reviewed systems are negative.  ALLERGIES: No Known Allergies  HOME MEDICATIONS: Outpatient Medications Prior to Visit  Medication Sig Dispense Refill   aspirin 81 MG chewable tablet Chew 1 tablet (81 mg total) by mouth 2 (two) times daily. 30 tablet 0   Calcium Carb-Cholecalciferol (CALCIUM 600/VITAMIN D PO) Take 1 tablet by mouth daily.     carvedilol (COREG) 12.5 MG tablet TAKE 1 TABLET TWICE A DAY AS WELL AS 6.25 MG TABLET TWICE A DAY (TOTAL OF 18.75 MG) 180 tablet 3   HYDROcodone-acetaminophen (NORCO) 5-325 MG tablet Take 1 tablet by mouth every 6 (six) hours as needed for moderate pain. 30 tablet 0   losartan-hydrochlorothiazide (HYZAAR) 100-25 MG tablet TAKE 1 TABLET DAILY (Patient taking differently: Take 1 tablet by mouth daily.) 90 tablet 2   metFORMIN (GLUCOPHAGE-XR) 500 MG 24 hr tablet Take 500 mg by mouth daily with supper.     methocarbamol (ROBAXIN) 500 MG tablet Take 1 tablet (500 mg total) by mouth every 6 (six) hours as needed for muscle spasms. 30 tablet 1   methocarbamol (ROBAXIN) 500 MG tablet Take 1 tablet (500 mg total) by mouth every 6 (six) hours as needed for muscle spasms. 30 tablet 1   modafinil (PROVIGIL) 200 MG tablet Take 1 tablet (200 mg total) by mouth daily. 90 tablet 1   Multiple Vitamin (MULTIVITAMIN WITH MINERALS) TABS tablet Take 1 tablet by mouth daily.  simvastatin (ZOCOR) 20 MG tablet TAKE 1 TABLET DAILY (Patient taking differently: Take 20 mg by mouth at bedtime.) 90 tablet 3   Testosterone 10 MG/ACT (2%) GEL Place 3 Pump onto the skin daily. On each inner thigh     topiramate (TOPAMAX) 25 MG tablet Take 12.5 mg by mouth at bedtime.  12   vitamin B-12 (CYANOCOBALAMIN) 1000 MCG tablet Take 3,000 mcg by mouth daily.     No facility-administered medications prior to visit.    PAST MEDICAL HISTORY: Past Medical History:  Diagnosis Date   Allergy    Cancer ([place])    skin cancer to upper chest/basal cell 2007   Chronic neck  pain    DJD   Chronic pain in right foot    DDD (degenerative disc disease), lumbar    Dyslipidemia    Erectile dysfunction    Exogenous obesity    H/O acquired cardiomyopathy 1996   RESOLVED (apparently was thought to have had an MI, but did not have any intervention).  He developed cardiomyopathy that forced him to retire from [employer]..  The current EF 60-65%.   History of fracture due to fall 1988   Bilateral wrist fractures   Hyperlipidemia    Hypertension    Hypogonadism male    IBS (irritable bowel syndrome)    Obstructive sleep apnea    Pre-diabetes    Sleep apnea    CPAP nightly    PAST SURGICAL HISTORY: Past Surgical History:  Procedure Laterality Date   CARDIAC CATHETERIZATION  2000   Nonobstructive disease.  Persistently reduced EF   CARPAL TUNNEL RELEASE Left    CARPAL TUNNEL RELEASE Right 01/05/2019   Procedure: RIGHT CARPAL TUNNEL RELEASE;  Surgeon: Leanora Cover, MD;  Location: [facility];  Service: Orthopedics;  Laterality: Right;  Bier block   COLONOSCOPY     CORONARY CT ANGIOGRAM  07/2017   Coronary calcium score 0?.  Focal noncalcific plaque (30%) in proximal LAD.  Otherwise no significant CAD.  Normal aortic root (3.2 cm)   HAMMER TOE SURGERY Right    ingrown toenail surgery Bilateral    2017   IR RADIOLOGY PERIPHERAL GUIDED IV START  07/25/2017   IR US GUIDE VASC ACCESS RIGHT  07/25/2017   MENISCUS REPAIR     ORIF WRIST FRACTURE Bilateral    PLANTAR FASCIA RELEASE Right    SHOULDER ARTHROSCOPY Left    TENDON REPAIR Right    right quad tendon repair   Tilt Table Test     Noted to have vasovagal syncope   TOTAL HIP ARTHROPLASTY Left 12/08/2020   Procedure: LEFT TOTAL HIP ARTHROPLASTY ANTERIOR APPROACH;  Surgeon: Mcarthur Rossetti, MD;  Location: [facility];  Service: Orthopedics;  Laterality: Left;  Needs RNFA   TOTAL HIP ARTHROPLASTY Right 03/30/2021   Procedure: RIGHT TOTAL HIP ARTHROPLASTY ANTERIOR APPROACH;  Surgeon:  Mcarthur Rossetti, MD;  Location: [facility];  Service: Orthopedics;  Laterality: Right;   TRANSTHORACIC ECHOCARDIOGRAM  06/21/2015   Normal LV function.  EF 57%.  Mild left atrial enlargement, trace AI, trace MR, mild PI.   TRANSTHORACIC ECHOCARDIOGRAM  06/2017   EF 60-65%.  Mild diastolic dysfunction.  No significant valvular abnormality.  Mild aortic calcification.   WISDOM TOOTH EXTRACTION Bilateral     FAMILY HISTORY: Family History  Problem Relation Age of Onset   Cancer Mother        duodenal CA   Heart disease Mother    Depression Mother  Stomach cancer Mother    Cancer Father    Hypertension Father    High Cholesterol Father    Heart disease Father    Diabetes Father    Hypertension Sister    High Cholesterol Sister    Heart disease Sister    Esophageal cancer Neg Hx    Rectal cancer Neg Hx    Colon cancer Neg Hx     SOCIAL HISTORY: Social History   Socioeconomic History   Marital status: Married    Spouse name: Not on file   Number of children: Not on file   Years of education: Not on file   Highest education level: Not on file  Occupational History   Not on file  Tobacco Use   Smoking status: Former    Types: Cigarettes    Quit date: 1996    Years since quitting: 26.9   Smokeless tobacco: Never  Vaping Use   Vaping Use: Never used  Substance and Sexual Activity   Alcohol use: Yes    Alcohol/week: 2.0 standard drinks    Types: 2 Standard drinks or equivalent per week    Comment: social   Drug use: No   Sexual activity: Not on file  Other Topics Concern   Not on file  Social History Narrative   He has been married for 36 years.     Son (born 63) -lives in [place]   Daughter (born 13) also lives in [place].   Retired from [employer].  Enjoys doing house remodeling.   Remote history of smoking, stopped in 1996.   Minimal alcohol use.   Social Determinants of Health   Financial Resource Strain: Not on file  Food Insecurity: Not on  file  Transportation Needs: Not on file  Physical Activity: Not on file  Stress: Not on file  Social Connections: Not on file  Intimate Partner Violence: Not on file      PHYSICAL EXAM Generalized: Well developed, in no acute distress   Neurological examination  Mentation: Alert oriented to time, place, history taking. Follows all commands speech and language fluent Cranial nerve II-XII:Extraocular movements were full. Facial symmetry noted. uvula tongue midline. Head turning and shoulder shrug  were normal and symmetric. Motor: Good strength throughout subjectively per patient Sensory: Sensory testing is intact to soft touch on all 4 extremities subjectively per patient Coordination: Cerebellar testing reveals good finger-nose-finger  Gait and station: Patient is able to stand from a seated position. gait is normal.  Reflexes: UTA  DIAGNOSTIC DATA (LABS, IMAGING, TESTING) - I reviewed patient records, labs, notes, testing and imaging myself where available.  Lab Results  Component Value Date   WBC 8.1 04/01/2021   HGB 12.2 (L) 04/01/2021   HCT 37.4 (L) 04/01/2021   MCV 93.0 04/01/2021   PLT 155 04/01/2021      Component Value Date/Time   NA 139 03/31/2021 0341   NA 145 (H) 06/26/2017 1049   K 3.5 03/31/2021 0341   CL 103 03/31/2021 0341   CO2 28 03/31/2021 0341   GLUCOSE 131 (H) 03/31/2021 0341   BUN 14 03/31/2021 0341   BUN 16 06/26/2017 1049   CREATININE 0.91 03/31/2021 0341   CALCIUM 8.5 (L) 03/31/2021 0341   GFRNONAA >60 03/31/2021 0341   GFRAA >60 01/01/2019 1224   No results found for: CHOL, HDL, LDLCALC, LDLDIRECT, TRIG, CHOLHDL Lab Results  Component Value Date   HGBA1C 5.9 (H) 03/19/2021   No results found for: VITAMINB12 No results  found for: TSH    ASSESSMENT AND PLAN 70 y.o. year old male  has a past medical history of Allergy, Cancer ([place]), Chronic neck pain, Chronic pain in right foot, DDD (degenerative disc disease), lumbar, Dyslipidemia,  Erectile dysfunction, Exogenous obesity, H/O acquired cardiomyopathy (1996), History of fracture due to fall (1988), Hyperlipidemia, Hypertension, Hypogonadism male, IBS (irritable bowel syndrome), Obstructive sleep apnea, Pre-diabetes, and Sleep apnea. here with:  OSA on CPAP  CPAP compliance excellent Residual AHI is good Encouraged patient to continue using CPAP nightly and > 4 hours each night  Narcolepsy Continue modafinil 200 mg daily  F/U in 1 year or sooner if needed    Ward Givens, MSN, NP-C 07/17/2021, 1:21 PM [facility] [address], [place], [place] [address] [phone]

## 2021-07-19 DIAGNOSIS — L820 Inflamed seborrheic keratosis: Secondary | ICD-10-CM | POA: Diagnosis not present

## 2021-07-19 DIAGNOSIS — D2362 Other benign neoplasm of skin of left upper limb, including shoulder: Secondary | ICD-10-CM | POA: Diagnosis not present

## 2021-07-19 DIAGNOSIS — Z85828 Personal history of other malignant neoplasm of skin: Secondary | ICD-10-CM | POA: Diagnosis not present

## 2021-07-19 DIAGNOSIS — L570 Actinic keratosis: Secondary | ICD-10-CM | POA: Diagnosis not present

## 2021-07-19 DIAGNOSIS — D235 Other benign neoplasm of skin of trunk: Secondary | ICD-10-CM | POA: Diagnosis not present

## 2021-07-19 DIAGNOSIS — Z23 Encounter for immunization: Secondary | ICD-10-CM | POA: Diagnosis not present

## 2021-07-19 DIAGNOSIS — L814 Other melanin hyperpigmentation: Secondary | ICD-10-CM | POA: Diagnosis not present

## 2021-08-15 ENCOUNTER — Other Ambulatory Visit: Payer: Self-pay

## 2021-08-15 ENCOUNTER — Ambulatory Visit (AMBULATORY_SURGERY_CENTER): Payer: Medicare Other | Admitting: *Deleted

## 2021-08-15 VITALS — Ht 72.0 in | Wt 252.0 lb

## 2021-08-15 DIAGNOSIS — Z86010 Personal history of colonic polyps: Secondary | ICD-10-CM

## 2021-08-15 MED ORDER — PEG 3350-KCL-NA BICARB-NACL 420 G PO SOLR: 4000.0000 mL | Freq: Once | ORAL | 0 refills | Status: AC

## 2021-08-15 NOTE — Progress Notes (Signed)
No egg or soy allergy known to patient  No issues known to pt with past sedation with any surgeries or procedures Patient denies ever being told they had issues or difficulty with intubation  No FH of Malignant Hyperthermia Pt is not on diet pills Pt is not on  home 02  Pt is not on blood thinners  Pt denies issues with constipation  No A fib or A flutter  Pt is fully vaccinated  for Covid   NO PA's for preps discussed with pt In PV today  Discussed with pt there will be an out-of-pocket cost for prep and that varies from $0 to 70 +  dollars - pt verbalized understanding   Due to the COVID-19 pandemic we are asking patients to follow certain guidelines in PV and the [facility]   Pt aware of COVID protocols and [facility] guidelines   PV completed over the phone. Pt verified name, DOB, address and insurance during PV today.  Pt mailed instruction packet with copy of consent form to read and not return, and instructions.   Pt encouraged to call with questions or issues.  If pt has My chart, procedure instructions sent via My Chart

## 2021-08-28 ENCOUNTER — Encounter: Payer: Self-pay | Admitting: Certified Registered Nurse Anesthetist

## 2021-08-29 ENCOUNTER — Ambulatory Visit (AMBULATORY_SURGERY_CENTER): Payer: Medicare Other | Admitting: Gastroenterology

## 2021-08-29 ENCOUNTER — Other Ambulatory Visit: Payer: Self-pay

## 2021-08-29 ENCOUNTER — Encounter: Payer: Self-pay | Admitting: Gastroenterology

## 2021-08-29 VITALS — BP 121/77 | HR 57 | Temp 97.5°F | Resp 13 | Ht 72.0 in | Wt 252.0 lb

## 2021-08-29 DIAGNOSIS — Z86010 Personal history of colonic polyps: Secondary | ICD-10-CM | POA: Diagnosis not present

## 2021-08-29 DIAGNOSIS — D122 Benign neoplasm of ascending colon: Secondary | ICD-10-CM | POA: Diagnosis not present

## 2021-08-29 DIAGNOSIS — D120 Benign neoplasm of cecum: Secondary | ICD-10-CM | POA: Diagnosis not present

## 2021-08-29 DIAGNOSIS — D123 Benign neoplasm of transverse colon: Secondary | ICD-10-CM | POA: Diagnosis not present

## 2021-08-29 DIAGNOSIS — I10 Essential (primary) hypertension: Secondary | ICD-10-CM | POA: Diagnosis not present

## 2021-08-29 DIAGNOSIS — G4733 Obstructive sleep apnea (adult) (pediatric): Secondary | ICD-10-CM | POA: Diagnosis not present

## 2021-08-29 MED ORDER — SODIUM CHLORIDE 0.9 % IV SOLN: 500.0000 mL | Freq: Once | INTRAVENOUS | Status: DC

## 2021-08-29 NOTE — Progress Notes (Signed)
Report given to PACU, vss 

## 2021-08-29 NOTE — Op Note (Signed)
[facility] Patient Name: Alexander [name] Procedure Date: 08/29/2021 8:26 AM MRN: [id number] Endoscopist: Remo Lipps P. Havery Moros , MD Age: 71 Referring MD:  Date of Birth: 02-08-1951 Gender: Male Account #: [id number] Procedure:                Colonoscopy Indications:              High risk colon cancer surveillance: Personal                            history of colonic polyps - 8 adenomas 6/019 Medicines:                Monitored Anesthesia Care Procedure:                Pre-Anesthesia Assessment:                           - Prior to the procedure, a History and Physical                            was performed, and patient medications and                            allergies were reviewed. The patient's tolerance of                            previous anesthesia was also reviewed. The risks                            and benefits of the procedure and the sedation                            options and risks were discussed with the patient.                            All questions were answered, and informed consent                            was obtained. Prior Anticoagulants: The patient has                            taken no previous anticoagulant or antiplatelet                            agents. ASA Grade Assessment: II - A patient with                            mild systemic disease. After reviewing the risks                            and benefits, the patient was deemed in                            satisfactory condition to undergo the procedure.  After obtaining informed consent, the colonoscope                            was passed under direct vision. Throughout the                            procedure, the patient's blood pressure, pulse, and                            oxygen saturations were monitored continuously. The                            CF [id number] #[id number] was introduced through the anus                            and advanced  to the the cecum, identified by                            appendiceal orifice and ileocecal valve. The                            colonoscopy was performed without difficulty. The                            patient tolerated the procedure well. The quality                            of the bowel preparation was adequate. The                            ileocecal valve, appendiceal orifice, and rectum                            were photographed. Scope In: 8:38:07 AM Scope Out: 9:03:17 AM Scope Withdrawal Time: 0 hours 22 minutes 34 seconds  Total Procedure Duration: 0 hours 25 minutes 10 seconds  Findings:                 The perianal and digital rectal examinations were                            normal.                           A few medium-mouthed diverticula were found in the                            sigmoid colon.                           A 3 mm polyp was found in the cecum. The polyp was                            sessile. The polyp was removed with a cold snare.  Resection and retrieval were complete.                           Two sessile polyps were found in the ascending                            colon. The polyps were 3 to 4 mm in size. These                            polyps were removed with a cold snare. Resection                            and retrieval were complete.                           A 6 mm polyp was found in the hepatic flexure. The                            polyp was sessile. The polyp was removed with a                            cold snare. Resection and retrieval were complete.                           Four flat and sessile polyps were found in the                            transverse colon. The polyps were 3 to 4 mm in                            size. These polyps were removed with a cold snare.                            Resection and retrieval were complete.                           Internal hemorrhoids were found during                             retroflexion. The hemorrhoids were small.                           The exam was otherwise without abnormality. Complications:            No immediate complications. Estimated blood loss:                            Minimal. Estimated Blood Loss:     Estimated blood loss was minimal. Impression:               - Diverticulosis in the sigmoid colon.                           - One 3 mm polyp in the cecum, removed  with a cold                            snare. Resected and retrieved.                           - Two 3 to 4 mm polyps in the ascending colon,                            removed with a cold snare. Resected and retrieved.                           - One 6 mm polyp at the hepatic flexure, removed                            with a cold snare. Resected and retrieved.                           - Four 3 to 4 mm polyps in the transverse colon,                            removed with a cold snare. Resected and retrieved.                           - Internal hemorrhoids.                           - The examination was otherwise normal. Recommendation:           - Patient has a contact number available for                            emergencies. The signs and symptoms of potential                            delayed complications were discussed with the                            patient. Return to normal activities tomorrow.                            Written discharge instructions were provided to the                            patient.                           - Resume previous diet.                           - Continue present medications.                           - Await pathology results. Anticipate repeat  colonoscopy in 3 years Carlota Raspberry. Gennie Dib, MD 08/29/2021 9:09:35 AM This report has been signed electronically.

## 2021-08-29 NOTE — Progress Notes (Signed)
[facility] History and Physical   Primary Care Physician:  Donnajean Lopes, MD   Reason for Procedure:   History of colon polyps  Plan:    colonoscopy     HPI: Alexander [name] is a 71 y.o. male  here for colonoscopy surveillance - 8 adenomas 01/2018. Patient denies any bowel symptoms at this time. No family history of colon cancer known. Otherwise feels well without any cardiopulmonary symptoms.    Past Medical History:  Diagnosis Date   Allergy    Cancer ([place])    skin cancer to upper chest/basal cell 2007   Chronic neck pain    DJD   Chronic pain in right foot    DDD (degenerative disc disease), lumbar    Dyslipidemia    Erectile dysfunction    Exogenous obesity    GERD (gastroesophageal reflux disease)    H/O acquired cardiomyopathy 1996   RESOLVED (apparently was thought to have had an MI, but did not have any intervention).  He developed cardiomyopathy that forced him to retire from [employer]..  The current EF 60-65%.   History of fracture due to fall 1988   Bilateral wrist fractures   Hyperlipidemia    Hypertension    Hypogonadism male    IBS (irritable bowel syndrome)    Obstructive sleep apnea    cpap since 1999   Pre-diabetes    on Metformin DAily with supper   Sleep apnea    CPAP nightly    Past Surgical History:  Procedure Laterality Date   CARDIAC CATHETERIZATION  2000   Nonobstructive disease.  Persistently reduced EF   CARPAL TUNNEL RELEASE Left    CARPAL TUNNEL RELEASE Right 01/05/2019   Procedure: RIGHT CARPAL TUNNEL RELEASE;  Surgeon: Leanora Cover, MD;  Location: [place];  Service: Orthopedics;  Laterality: Right;  Bier block   COLONOSCOPY     CORONARY CT ANGIOGRAM  07/2017   Coronary calcium score 0?.  Focal noncalcific plaque (30%) in proximal LAD.  Otherwise no significant CAD.  Normal aortic root (3.2 cm)   HAMMER TOE SURGERY Bilateral    ingrown toenail surgery Bilateral    2017   IR RADIOLOGY  PERIPHERAL GUIDED IV START  07/25/2017   IR US GUIDE VASC ACCESS RIGHT  07/25/2017   MENISCUS REPAIR Right    2016   ORIF WRIST FRACTURE Bilateral    PLANTAR FASCIA RELEASE Right    POLYPECTOMY     SHOULDER ARTHROSCOPY Left    TENDON REPAIR Right    right quad tendon repair   Tilt Table Test     Noted to have vasovagal syncope   TOTAL HIP ARTHROPLASTY Left 12/08/2020   Procedure: LEFT TOTAL HIP ARTHROPLASTY ANTERIOR APPROACH;  Surgeon: Mcarthur Rossetti, MD;  Location[facility];  Service: Orthopedics;  Laterality: Left;  Needs RNFA   TOTAL HIP ARTHROPLASTY Right 03/30/2021   Procedure: RIGHT TOTAL HIP ARTHROPLASTY ANTERIOR APPROACH;  Surgeon: Mcarthur Rossetti, MD;  Location[facility];  Service: Orthopedics;  Laterality: Right;   TRANSTHORACIC ECHOCARDIOGRAM  06/21/2015   Normal LV function.  EF 57%.  Mild left atrial enlargement, trace AI, trace MR, mild PI.   TRANSTHORACIC ECHOCARDIOGRAM  06/2017   EF 60-65%.  Mild diastolic dysfunction.  No significant valvular abnormality.  Mild aortic calcification.   WISDOM TOOTH EXTRACTION Bilateral     Prior to Admission medications   Medication Sig Start Date End Date Taking? Authorizing Provider  aspirin 81 MG chewable  tablet Chew 1 tablet (81 mg total) by mouth 2 (two) times daily. Patient taking differently: Chew 81 mg by mouth daily. 03/30/21  Yes Mcarthur Rossetti, MD  Calcium Carb-Cholecalciferol (CALCIUM 600/VITAMIN D PO) Take 1 tablet by mouth daily.   Yes [provider]  carvedilol (COREG) 12.5 MG tablet TAKE 1 TABLET TWICE A DAY AS WELL AS 6.25 MG TABLET TWICE A DAY (TOTAL OF 18.75 MG) 03/26/21  Yes Leonie Man, MD  losartan-hydrochlorothiazide (HYZAAR) 100-25 MG tablet TAKE 1 TABLET DAILY Patient taking differently: Take 1 tablet by mouth daily. 11/13/20  Yes Leonie Man, MD  metFORMIN (GLUCOPHAGE-XR) 500 MG 24 hr tablet Take 500 mg by mouth daily with supper. 03/11/20  Yes [provider]   modafinil (PROVIGIL) 200 MG tablet Take 1 tablet (200 mg total) by mouth daily. Patient taking differently: Take 200 mg by mouth daily. In the morning for narcolepsy 05/21/21  Yes Ward Givens, NP  Multiple Vitamin (MULTIVITAMIN WITH MINERALS) TABS tablet Take 1 tablet by mouth daily.   Yes [provider]  simvastatin (ZOCOR) 20 MG tablet TAKE 1 TABLET DAILY Patient taking differently: Take 20 mg by mouth at bedtime. 10/30/20  Yes Leonie Man, MD  Testosterone 30 MG/ACT SOLN 1 Pump daily. Apply under the arms 07/19/21  Yes [provider]  topiramate (TOPAMAX) 25 MG tablet Take 12.5 mg by mouth at bedtime. 05/25/17  Yes [provider]  vitamin B-12 (CYANOCOBALAMIN) 1000 MCG tablet Take 3,000 mcg by mouth daily.   Yes [provider]    Current Outpatient Medications  Medication Sig Dispense Refill   aspirin 81 MG chewable tablet Chew 1 tablet (81 mg total) by mouth 2 (two) times daily. (Patient taking differently: Chew 81 mg by mouth daily.) 30 tablet 0   Calcium Carb-Cholecalciferol (CALCIUM 600/VITAMIN D PO) Take 1 tablet by mouth daily.     carvedilol (COREG) 12.5 MG tablet TAKE 1 TABLET TWICE A DAY AS WELL AS 6.25 MG TABLET TWICE A DAY (TOTAL OF 18.75 MG) 180 tablet 3   losartan-hydrochlorothiazide (HYZAAR) 100-25 MG tablet TAKE 1 TABLET DAILY (Patient taking differently: Take 1 tablet by mouth daily.) 90 tablet 2   metFORMIN (GLUCOPHAGE-XR) 500 MG 24 hr tablet Take 500 mg by mouth daily with supper.     modafinil (PROVIGIL) 200 MG tablet Take 1 tablet (200 mg total) by mouth daily. (Patient taking differently: Take 200 mg by mouth daily. In the morning for narcolepsy) 90 tablet 1   Multiple Vitamin (MULTIVITAMIN WITH MINERALS) TABS tablet Take 1 tablet by mouth daily.     simvastatin (ZOCOR) 20 MG tablet TAKE 1 TABLET DAILY (Patient taking differently: Take 20 mg by mouth at bedtime.) 90 tablet 3   Testosterone 30 MG/ACT SOLN 1 Pump daily. Apply  under the arms     topiramate (TOPAMAX) 25 MG tablet Take 12.5 mg by mouth at bedtime.  12   vitamin B-12 (CYANOCOBALAMIN) 1000 MCG tablet Take 3,000 mcg by mouth daily.     Current Facility-Administered Medications  Medication Dose Route Frequency Provider Last Rate Last Admin   0.9 %  sodium chloride infusion  500 mL Intravenous Once Genetta Fiero, Carlota Raspberry, MD        Allergies as of 08/29/2021   (No Known Allergies)    Family History  Problem Relation Age of Onset   Cancer Mother        duodenal CA   Heart disease Mother    Depression Mother  Stomach cancer Mother    Cancer Father    Hypertension Father    High Cholesterol Father    Heart disease Father    Diabetes Father    Hypertension Sister    High Cholesterol Sister    Heart disease Sister    Esophageal cancer Neg Hx    Rectal cancer Neg Hx    Colon cancer Neg Hx    Colon polyps Neg Hx     Social History   Socioeconomic History   Marital status: Married    Spouse name: Not on file   Number of children: Not on file   Years of education: Not on file   Highest education level: Not on file  Occupational History   Not on file  Tobacco Use   Smoking status: Former    Types: Cigarettes    Quit date: 1996    Years since quitting: 27.0   Smokeless tobacco: Never  Vaping Use   Vaping Use: Never used  Substance and Sexual Activity   Alcohol use: Yes    Alcohol/week: 2.0 standard drinks    Types: 2 Standard drinks or equivalent per week    Comment: social   Drug use: No   Sexual activity: Not on file  Other Topics Concern   Not on file  Social History Narrative   He has been married for 36 years.     Son (born 15) -lives in [place]   Daughter (born 19) also lives in [place].   Retired from [employer].  Enjoys doing house remodeling.   Remote history of smoking, stopped in 1996.   Minimal alcohol use.   Social Determinants of Health   Financial Resource Strain: Not on file  Food Insecurity: Not  on file  Transportation Needs: Not on file  Physical Activity: Not on file  Stress: Not on file  Social Connections: Not on file  Intimate Partner Violence: Not on file    Review of Systems: All other review of systems negative except as mentioned in the HPI.  Physical Exam: Vital signs BP (!) 142/88    Pulse (!) 57    Temp (!) 97.5 F (36.4 C) (Skin)    Resp 17    Ht 6' (1.829 m)    Wt 252 lb (114.3 kg)    SpO2 98%    BMI 34.18 kg/m   General:   Alert,  Well-developed, pleasant and cooperative in NAD Lungs:  Clear throughout to auscultation.   Heart:  Regular rate and rhythm Abdomen:  Soft, nontender and nondistended.   Neuro/Psych:  Alert and cooperative. Normal mood and affect. A and O x 3  Jolly Mango, MD [facility]

## 2021-08-29 NOTE — Progress Notes (Signed)
Called to room to assist during endoscopic procedure.  Patient ID and intended procedure confirmed with present staff. Received instructions for my participation in the procedure from the performing physician.  

## 2021-08-29 NOTE — Patient Instructions (Signed)
Handouts Provided:  Polyps and Diverticulosis ° °YOU HAD AN ENDOSCOPIC PROCEDURE TODAY AT THE [facility]:   Refer to the procedure report that was given to you for any specific questions about what was found during the examination.  If the procedure report does not answer your questions, please call your gastroenterologist to clarify.  If you requested that your care partner not be given the details of your procedure findings, then the procedure report has been included in a sealed envelope for you to review at your convenience later. ° °YOU SHOULD EXPECT: Some feelings of bloating in the abdomen. Passage of more gas than usual.  Walking can help get rid of the air that was put into your GI tract during the procedure and reduce the bloating. If you had a lower endoscopy (such as a colonoscopy or flexible sigmoidoscopy) you may notice spotting of blood in your stool or on the toilet paper. If you underwent a bowel prep for your procedure, you may not have a normal bowel movement for a few days. ° °Please Note:  You might notice some irritation and congestion in your nose or some drainage.  This is from the oxygen used during your procedure.  There is no need for concern and it should clear up in a day or so. ° °SYMPTOMS TO REPORT IMMEDIATELY: ° °Following lower endoscopy (colonoscopy or flexible sigmoidoscopy): ° Excessive amounts of blood in the stool ° Significant tenderness or worsening of abdominal pains ° Swelling of the abdomen that is new, acute ° Fever of 100°F or higher ° °For urgent or emergent issues, a gastroenterologist can be reached at any hour by calling [phone]. °Do not use MyChart messaging for urgent concerns.  ° ° °DIET:  We do recommend a small meal at first, but then you may proceed to your regular diet.  Drink plenty of fluids but you should avoid alcoholic beverages for 24 hours. ° °ACTIVITY:  You should plan to take it easy for the rest of today and you should NOT DRIVE  or use heavy machinery until tomorrow (because of the sedation medicines used during the test).   ° °FOLLOW UP: °Our staff will call the number listed on your records 48-72 hours following your procedure to check on you and address any questions or concerns that you may have regarding the information given to you following your procedure. If we do not reach you, we will leave a message.  We will attempt to reach you two times.  During this call, we will ask if you have developed any symptoms of COVID 19. If you develop any symptoms (ie: fever, flu-like symptoms, shortness of breath, cough etc.) before then, please call [phone].  If you test positive for Covid 19 in the 2 weeks post procedure, please call and report this information to us.   ° °If any biopsies were taken you will be contacted by phone or by letter within the next 1-3 weeks.  Please call us at [phone] if you have not heard about the biopsies in 3 weeks.  ° ° °SIGNATURES/CONFIDENTIALITY: °You and/or your care partner have signed paperwork which will be entered into your electronic medical record.  These signatures attest to the fact that that the information above on your After Visit Summary has been reviewed and is understood.  Full responsibility of the confidentiality of this discharge information lies with you and/or your care-partner. ° °

## 2021-08-29 NOTE — Progress Notes (Signed)
Pt's states no medical or surgical changes since previsit or office visit. VS assessed by N.[place] ?

## 2021-08-31 ENCOUNTER — Telehealth: Payer: Self-pay

## 2021-08-31 NOTE — Telephone Encounter (Signed)
°  Follow up Call-  Call back number 08/29/2021  Post procedure Call Back phone  # [phone]  Permission to leave phone message Yes  Some recent data might be hidden     Patient questions:  Do you have a fever, pain , or abdominal swelling? No. Pain Score  0 *  Have you tolerated food without any problems? Yes.    Have you been able to return to your normal activities? Yes.    Do you have any questions about your discharge instructions: Diet   No. Medications  No. Follow up visit  No.  Do you have questions or concerns about your Care? No.  Actions: * If pain score is 4 or above: No action needed, pain <4.  Have you developed a fever since your procedure? no  2.   Have you had an respiratory symptoms (SOB or cough) since your procedure? no  3.   Have you tested positive for COVID 19 since your procedure no  4.   Have you had any family members/close contacts diagnosed with the COVID 19 since your procedure?  no   If yes to any of these questions please route to Joylene John, RN and Joella Prince, RN

## 2021-09-03 DIAGNOSIS — H35033 Hypertensive retinopathy, bilateral: Secondary | ICD-10-CM | POA: Diagnosis not present

## 2021-09-26 ENCOUNTER — Other Ambulatory Visit: Payer: Self-pay | Admitting: Cardiology

## 2021-10-01 ENCOUNTER — Ambulatory Visit (INDEPENDENT_AMBULATORY_CARE_PROVIDER_SITE_OTHER): Payer: Medicare Other

## 2021-10-01 ENCOUNTER — Other Ambulatory Visit: Payer: Self-pay

## 2021-10-01 ENCOUNTER — Telehealth: Payer: Self-pay | Admitting: *Deleted

## 2021-10-01 ENCOUNTER — Ambulatory Visit (INDEPENDENT_AMBULATORY_CARE_PROVIDER_SITE_OTHER): Payer: Medicare Other | Admitting: Orthopaedic Surgery

## 2021-10-01 DIAGNOSIS — M25512 Pain in left shoulder: Secondary | ICD-10-CM

## 2021-10-01 DIAGNOSIS — M898X1 Other specified disorders of bone, shoulder: Secondary | ICD-10-CM | POA: Diagnosis not present

## 2021-10-01 MED ORDER — METHYLPREDNISOLONE ACETATE 40 MG/ML IJ SUSP
40.0000 mg | INTRAMUSCULAR | Status: AC | PRN
  Administered 2021-10-01: 40 mg via INTRA_ARTICULAR

## 2021-10-01 MED ORDER — LIDOCAINE HCL 1 % IJ SOLN
3.0000 mL | INTRAMUSCULAR | Status: AC | PRN
  Administered 2021-10-01: 3 mL

## 2021-10-01 NOTE — Telephone Encounter (Signed)
Patient called this morning stating he has fallen over the weekend and hit his shoulder along with falling onto his hip. S/P bilateral hip replacements within the last year. States hips feel ok, but really jarred them. Shoulder has lump now and is really in quite a bit of pain that is radiating down into the elbow of left arm. Appointment today with Dr. [name] at 1:00 pm.

## 2021-10-01 NOTE — Progress Notes (Signed)
Office Visit Note   Patient: Alexander [name]           Date of Birth: [birth year]           MRN: [id number] Visit Date: 10/01/2021              Requested by: Donnajean Lopes, MD [address] [place],  [place] [address] PCP: Donnajean Lopes, MD   Assessment & Plan: Visit Diagnoses:  1. Pain of left clavicle   2. Acute pain of left shoulder     Plan: I did recommend a steroid injection today in the subacromial outlet of his left shoulder he agreed to this and tolerated well.  I have recommended that he take 2 Aleve twice a day with meals for the next week.  He will limit activities with that left shoulder but do not decrease his motion.  I would like to see him back in 4 weeks for repeat exam of the left shoulder.  Follow-Up Instructions: Return in about 4 weeks (around 10/29/2021).   Orders:  Orders Placed This Encounter  Procedures   Large Joint Inj   XR Clavicle Left   No orders of the defined types were placed in this encounter.     Procedures: Large Joint Inj: L subacromial bursa on 10/01/2021 12:59 PM Indications: pain and diagnostic evaluation Details: 22 G 1.5 in needle  Arthrogram: No  Medications: 3 mL lidocaine 1 %; 40 mg methylPREDNISolone acetate 40 MG/ML Outcome: tolerated well, no immediate complications Procedure, treatment alternatives, risks and benefits explained, specific risks discussed. Consent was given by the patient. Immediately prior to procedure a time out was called to verify the correct patient, procedure, equipment, support staff and site/side marked as required. Patient was prepped and draped in the usual sterile fashion.      Clinical Data: No additional findings.   Subjective: Chief Complaint  Patient presents with   Left Shoulder - Pain  The patient is well-known to [place].  We replaced his hips with 1 being in April of last year and one being in August of last year.  He comes in today because cervix ago he fell carrying a  wheelbarrow with rocks in it.  He is been having left shoulder pain since then he points to the [facility] joint as a source of his pain.  He feels like that area is more prominent and he had not noticed that before.  He has had trouble lifting his arm above his head and pain in general.  He has taken occasional Aleve but nothing on a regular basis.  He says the hips are doing well.  This was not a planned visit for his hips today.  He is borderline diabetic and takes metformin.  HPI  Review of Systems He currently denies any headache, chest pain, shortness of breath, fever, chills, nausea, vomiting  Objective: Vital Signs: There were no vitals taken for this visit.  Physical Exam He is alert and orient x3 and in no acute distress Ortho Exam On inspection of both shoulders there is some prominence of the [facility] joint on the left side is not present on the right side.  There is also pain in the subacromial outlet and pain at the [facility] joint.  The shoulder is clinically well located.  He does use his rotator cuff appropriately to abduct and externally rotate the left shoulder. Specialty Comments:  No specialty comments available.  Imaging: XR Clavicle Left  Result Date: 10/01/2021 2 views  of the left clavicle show no acute findings.  There is no fracture.  The glenohumeral joints also seen and there is no dislocation.  There is prominence of the [facility] joint with a distal clavicle but not a separation.  There is arthritic changes at the [facility] joint.    PMFS History: Patient Active Problem List   Diagnosis Date Noted   Status post total replacement of right hip 03/30/2021   Status post left hip replacement 12/08/2020   Unilateral primary osteoarthritis, left hip 10/16/2020   Unilateral primary osteoarthritis, right hip 10/16/2020   Hyperlipidemia due to dietary fat intake 10/01/2019   Morbid obesity ([place]) 10/01/2019   Excessive daytime sleepiness 03/03/2018   Obstructive sleep apnea treated with bilevel  positive airway pressure (BiPAP) 03/03/2018   Sleep paralysis, recurrent isolated 03/03/2018   Cataplexy 03/03/2018   Abnormal dreams 03/03/2018   Breathholding 03/03/2018   Hypersomnia, persistent 09/03/2017   Exertional dyspnea 06/20/2017   Vasovagal syncope 06/20/2017   Essential hypertension 06/20/2017   History of cardiomyopathy 06/20/2017   Past Medical History:  Diagnosis Date   Allergy    Cancer ([place])    skin cancer to upper chest/basal cell 2007   Chronic neck pain    DJD   Chronic pain in right foot    DDD (degenerative disc disease), lumbar    Dyslipidemia    Erectile dysfunction    Exogenous obesity    GERD (gastroesophageal reflux disease)    H/O acquired cardiomyopathy 1996   RESOLVED (apparently was thought to have had an MI, but did not have any intervention).  He developed cardiomyopathy that forced him to retire from [employer]..  The current EF 60-65%.   History of fracture due to fall 1988   Bilateral wrist fractures   Hyperlipidemia    Hypertension    Hypogonadism male    IBS (irritable bowel syndrome)    Obstructive sleep apnea    cpap since 1999   Pre-diabetes    on Metformin DAily with supper   Sleep apnea    CPAP nightly    Family History  Problem Relation Age of Onset   Cancer Mother        duodenal CA   Heart disease Mother    Depression Mother    Stomach cancer Mother    Cancer Father    Hypertension Father    High Cholesterol Father    Heart disease Father    Diabetes Father    Hypertension Sister    High Cholesterol Sister    Heart disease Sister    Esophageal cancer Neg Hx    Rectal cancer Neg Hx    Colon cancer Neg Hx    Colon polyps Neg Hx     Past Surgical History:  Procedure Laterality Date   CARDIAC CATHETERIZATION  2000   Nonobstructive disease.  Persistently reduced EF   CARPAL TUNNEL RELEASE Left    CARPAL TUNNEL RELEASE Right 01/05/2019   Procedure: RIGHT CARPAL TUNNEL RELEASE;  Surgeon: Leanora Cover, MD;   Location: [place];  Service: Orthopedics;  Laterality: Right;  Bier block   COLONOSCOPY     CORONARY CT ANGIOGRAM  07/2017   Coronary calcium score 0?.  Focal noncalcific plaque (30%) in proximal LAD.  Otherwise no significant CAD.  Normal aortic root (3.2 cm)   HAMMER TOE SURGERY Bilateral    ingrown toenail surgery Bilateral    2017   IR RADIOLOGY PERIPHERAL GUIDED IV START  07/25/2017  IR US GUIDE VASC ACCESS RIGHT  07/25/2017   MENISCUS REPAIR Right    2016   ORIF WRIST FRACTURE Bilateral    PLANTAR FASCIA RELEASE Right    POLYPECTOMY     SHOULDER ARTHROSCOPY Left    TENDON REPAIR Right    right quad tendon repair   Tilt Table Test     Noted to have vasovagal syncope   TOTAL HIP ARTHROPLASTY Left 12/08/2020   Procedure: LEFT TOTAL HIP ARTHROPLASTY ANTERIOR APPROACH;  Surgeon: Mcarthur Rossetti, MD;  [facility];  Service: Orthopedics;  Laterality: Left;  Needs RNFA   TOTAL HIP ARTHROPLASTY Right 03/30/2021   Procedure: RIGHT TOTAL HIP ARTHROPLASTY ANTERIOR APPROACH;  Surgeon: Mcarthur Rossetti, MD;  [facility];  Service: Orthopedics;  Laterality: Right;   TRANSTHORACIC ECHOCARDIOGRAM  06/21/2015   Normal LV function.  EF 57%.  Mild left atrial enlargement, trace AI, trace MR, mild PI.   TRANSTHORACIC ECHOCARDIOGRAM  06/2017   EF 60-65%.  Mild diastolic dysfunction.  No significant valvular abnormality.  Mild aortic calcification.   WISDOM TOOTH EXTRACTION Bilateral    Social History   Occupational History   Not on file  Tobacco Use   Smoking status: Former    Types: Cigarettes    Quit date: 1996    Years since quitting: 27.1   Smokeless tobacco: Never  Vaping Use   Vaping Use: Never used  Substance and Sexual Activity   Alcohol use: Yes    Alcohol/week: 2.0 standard drinks    Types: 2 Standard drinks or equivalent per week    Comment: social   Drug use: No   Sexual activity: Not on file

## 2021-10-10 ENCOUNTER — Encounter: Payer: Self-pay | Admitting: Cardiology

## 2021-10-10 ENCOUNTER — Other Ambulatory Visit: Payer: Self-pay

## 2021-10-10 ENCOUNTER — Ambulatory Visit (INDEPENDENT_AMBULATORY_CARE_PROVIDER_SITE_OTHER): Payer: Medicare Other | Admitting: Cardiology

## 2021-10-10 VITALS — BP 140/86 | HR 53 | Ht 72.0 in | Wt 264.2 lb

## 2021-10-10 DIAGNOSIS — Z8679 Personal history of other diseases of the circulatory system: Secondary | ICD-10-CM | POA: Diagnosis not present

## 2021-10-10 DIAGNOSIS — R0609 Other forms of dyspnea: Secondary | ICD-10-CM

## 2021-10-10 DIAGNOSIS — I10 Essential (primary) hypertension: Secondary | ICD-10-CM

## 2021-10-10 DIAGNOSIS — G4733 Obstructive sleep apnea (adult) (pediatric): Secondary | ICD-10-CM

## 2021-10-10 DIAGNOSIS — E7849 Other hyperlipidemia: Secondary | ICD-10-CM | POA: Diagnosis not present

## 2021-10-10 MED ORDER — SIMVASTATIN 20 MG PO TABS: 20.0000 mg | ORAL_TABLET | Freq: Every day | ORAL | 3 refills | Status: DC

## 2021-10-10 MED ORDER — LOSARTAN POTASSIUM-HCTZ 100-25 MG PO TABS: 1.0000 | ORAL_TABLET | Freq: Every day | ORAL | 3 refills | Status: DC

## 2021-10-10 MED ORDER — CARVEDILOL 12.5 MG PO TABS: 12.5000 mg | ORAL_TABLET | Freq: Two times a day (BID) | ORAL | 3 refills | Status: DC

## 2021-10-10 NOTE — Progress Notes (Signed)
Primary Care Provider: Donnajean Lopes, MD Cardiologist: Glenetta Hew, MD Electrophysiologist: None  Clinic Note: Chief Complaint  Patient presents with   Follow-up    68-month-delayed.  Likely delayed because of recent surgeries.   Shortness of breath on exertion    Easy fatigue, exertional dyspnea.  Hard to take deep breath.   ===================================  ASSESSMENT/PLAN   Problem List Items Addressed This Visit       Cardiology Problems   Hyperlipidemia due to dietary fat intake - Primary (Chronic)    Lipid panel looks great.  Last LDL 52.  Continue simvastatin.  Tolerating well.        Relevant Medications   aspirin EC 81 MG tablet   simvastatin (ZOCOR) 20 MG tablet   losartan-hydrochlorothiazide (HYZAAR) 100-25 MG tablet   carvedilol (COREG) 12.5 MG tablet   Essential hypertension (Chronic)    Blood pressure is little high.  Has been running a little bit high of late.  Can reassess with follow-up testing. With bradycardia though I think this is carvedilol dose down to 12.5 mg twice daily.  (Need to determine if actually taking 12.5 mg versus 18.75 mg twice daily)  On high-dose losartan-HCTZ.  May need to consider switching to different ARB.      Relevant Medications   aspirin EC 81 MG tablet   simvastatin (ZOCOR) 20 MG tablet   losartan-hydrochlorothiazide (HYZAAR) 100-25 MG tablet   carvedilol (COREG) 12.5 MG tablet   Other Relevant Orders   EKG 12-Lead (Completed)   ECHOCARDIOGRAM COMPLETE     Other   Exertional dyspnea (Chronic)    Now noting exertional dyspnea with difficulty catching the breath again.  May very well be related to his weight being back up again, but with his history of cardiomyopathy, need to ensure that it is not to cardiac etiology.  Plan: Recheck 2D echocardiogram and CPX (cardiopulmonary stress test).      Relevant Orders   EKG 12-Lead (Completed)   Cardiopulmonary exercise test   ECHOCARDIOGRAM COMPLETE    History of cardiomyopathy (Chronic)    He had previously had an issue with cardiomyopathy.  This seemed to have improved, but now with him having recurrence of dyspnea, will reassess with 2D echocardiogram.      Relevant Orders   EKG 12-Lead (Completed)   Cardiopulmonary exercise test   ECHOCARDIOGRAM COMPLETE   Obstructive sleep apnea treated with bilevel positive airway pressure (BiPAP) (Chronic)    Recommended continued use of BiPAP      Relevant Orders   Cardiopulmonary exercise test    ===================================  HPI:    Alexander [name] is a 71 y.o. male with a PMH notable for history of either MI or Cardiomyopathy 90s (medically retired from [employer] improved EF back to 60 to 65% (November 2018) very low Coronary Calcium Score (0-December 2018) who presents today for 18-month follow-up.  Alexander [name] was last seen in August 2021 he had lost 36 pounds.  Energy improved.  Blood pressure is doing well.  Continues workshop to Every day.  Less dyspneic.  She is allergies.  No chest pain or pressure..  Mild edema, vertigo.  History of vasovagal syncope Reduce carvedilol dose to 12.5 mg twice daily  Recent Hospitalizations:  12/08/2020: Left Hip Arthroplasty (Total Replacement of Left Hip) 03/30/2021: Right Hip Arthroplasty (Total Replacement of Right Hip)  Reviewed  CV studies:    The following studies were reviewed today: (if available, images/films reviewed: From Epic Chart or Care Everywhere)  12/29/2020-Left Lower Extremity Venous Doppler: No superficial or deep venous thrombosis.  Interval History:   Alexander [name] presents for delayed follow-up.  He thinks that his appointments got rescheduled because of his bilateral hip surgery and then the fall where his hip.   He had a recent fall where he fell on his left hip and shoulder.  He was not sure if he injured his hip --> recent steroid shot.   He wanted to get an to be seen however, because he has  been noticing that he has a sensation where he just tires much easier than he used to.  He gets short of breath with exertion.  He feels like he needs to stop and take a deep breath.  He has a hard time taking a deep breath and feeling that he gets enough air in.  He says this can happen both at rest but more often with activity.  He does not have any sensation of chest tightness or pressure, just this difficulty catching his breath.  He denies any irregular heartbeats palpitations.  No real PND orthopnea, and is trying to use his BiPAP routinely.  He has some mild pedal swelling but nothing dramatic.  He actually feels that he was doing pretty well especially after the second hip operation is frustrating and active, but of late has been noticing this dyspnea sensation.  He does note that his weight has gone back up from when I last saw.  He had been down to 239 pounds when I last saw him in August 2021.  He is now up to 264 pounds.  CV Review of Symptoms (Summary) Cardiovascular ROS: positive for - dyspnea on exertion, edema, orthopnea, paroxysmal nocturnal dyspnea, and he has a PND and orthopnea if he does not use CPAP/BiPAP negative for - chest pain, irregular heartbeat, palpitations, rapid heart rate, or except 1 dyspnea, he denies any lightheadedness dizziness.  No syncope/near syncope or TIA amaurosis fugax.  No claudication.  REVIEWED OF SYSTEMS   Review of Systems  Constitutional:  Positive for malaise/fatigue (Notes exercise intolerance getting tired/worn out at the end of the day.  Some exertional dyspnea.  (Of note, similar symptoms noted during his last visit with me)). Negative for weight loss (Weight gain).  HENT:  Negative for nosebleeds.   Respiratory:  Positive for shortness of breath. Negative for cough and sputum production.   Cardiovascular:        Per HPI  Gastrointestinal:  Negative for abdominal pain, blood in stool and melena.  Genitourinary:  Negative for hematuria.   Musculoskeletal:  Positive for back pain, falls (Per HPI) and joint pain (Left hip left shoulder after fall). Negative for myalgias.  Neurological:  Negative for dizziness (Patient does have history of vertigo, but has not had an issue recently.), focal weakness and headaches.  Psychiatric/Behavioral: Negative.     I have reviewed and (if needed) personally updated the patient's problem list, medications, allergies, past medical and surgical history, social and family history.   I have reviewed and (if needed) personally updated the patient's problem list, medications, allergies, past medical and surgical history, social and family history.   PAST MEDICAL HISTORY   Past Medical History:  Diagnosis Date   Allergy    Cancer ([place])    skin cancer to upper chest/basal cell 2007   Chronic neck pain    DJD   Chronic pain in right foot    DDD (degenerative disc disease), lumbar    Dyslipidemia  Erectile dysfunction    Exogenous obesity    GERD (gastroesophageal reflux disease)    H/O acquired cardiomyopathy 1996   RESOLVED (apparently was thought to have had an MI, but did not have any intervention).  He developed cardiomyopathy that forced him to retire from [employer]..  The current EF 60-65%.   History of fracture due to fall 1988   Bilateral wrist fractures   Hyperlipidemia    Hypertension    Hypogonadism male    IBS (irritable bowel syndrome)    Obstructive sleep apnea    cpap since 1999   Pre-diabetes    on Metformin DAily with supper   Sleep apnea    CPAP nightly    PAST SURGICAL HISTORY   Past Surgical History:  Procedure Laterality Date   CARDIAC CATHETERIZATION  2000   Nonobstructive disease.  Persistently reduced EF   CARPAL TUNNEL RELEASE Left    CARPAL TUNNEL RELEASE Right 01/05/2019   Procedure: RIGHT CARPAL TUNNEL RELEASE;  Surgeon: Leanora Cover, MD;  Location: [facility];  Service: Orthopedics;  Laterality: Right;  Bier block   COLONOSCOPY      CORONARY CT ANGIOGRAM  07/2017   Coronary calcium score 0?.  Focal noncalcific plaque (30%) in proximal LAD.  Otherwise no significant CAD.  Normal aortic root (3.2 cm)   HAMMER TOE SURGERY Bilateral    ingrown toenail surgery Bilateral    2017   IR RADIOLOGY PERIPHERAL GUIDED IV START  07/25/2017   IR US GUIDE VASC ACCESS RIGHT  07/25/2017   MENISCUS REPAIR Right    2016   ORIF WRIST FRACTURE Bilateral    PLANTAR FASCIA RELEASE Right    POLYPECTOMY     SHOULDER ARTHROSCOPY Left    TENDON REPAIR Right    right quad tendon repair   Tilt Table Test     Noted to have vasovagal syncope   TOTAL HIP ARTHROPLASTY Left 12/08/2020   Procedure: LEFT TOTAL HIP ARTHROPLASTY ANTERIOR APPROACH;  Surgeon: Mcarthur Rossetti, MD;  Location: [facility];  Service: Orthopedics;  Laterality: Left;  Needs RNFA   TOTAL HIP ARTHROPLASTY Right 03/30/2021   Procedure: RIGHT TOTAL HIP ARTHROPLASTY ANTERIOR APPROACH;  Surgeon: Mcarthur Rossetti, MD;  Location: [facility];  Service: Orthopedics;  Laterality: Right;   TRANSTHORACIC ECHOCARDIOGRAM  06/21/2015   Normal LV function.  EF 57%.  Mild left atrial enlargement, trace AI, trace MR, mild PI.   TRANSTHORACIC ECHOCARDIOGRAM  06/2017   EF 60-65%.  Mild diastolic dysfunction.  No significant valvular abnormality.  Mild aortic calcification.   WISDOM TOOTH EXTRACTION Bilateral     Immunization History  Administered Date(s) Administered   PFIZER(Purple Top)SARS-COV-2 Vaccination 10/14/2019, 11/09/2019    MEDICATIONS/ALLERGIES   Current Meds  Medication Sig   aspirin EC 81 MG tablet Take 81 mg by mouth daily. Swallow whole.   Calcium Carb-Cholecalciferol (CALCIUM 600/VITAMIN D PO) Take 1 tablet by mouth daily.   metFORMIN (GLUCOPHAGE-XR) 500 MG 24 hr tablet Take 500 mg by mouth daily with supper.   modafinil (PROVIGIL) 200 MG tablet Take 1 tablet (200 mg total) by mouth daily. (Patient taking differently: Take 200 mg by mouth daily. In the morning  for narcolepsy)   Multiple Vitamin (MULTIVITAMIN WITH MINERALS) TABS tablet Take 1 tablet by mouth daily.   Testosterone 30 MG/ACT SOLN 1 Pump daily. Apply under the arms   topiramate (TOPAMAX) 25 MG tablet Take 12.5 mg by mouth at bedtime.   vitamin B-12 (CYANOCOBALAMIN) 1000  MCG tablet Take 3,000 mcg by mouth daily.   [DISCONTINUED] carvedilol (COREG) 12.5 MG tablet Take 12.5 mg by mouth 2 (two) times daily with a meal.   [DISCONTINUED] losartan-hydrochlorothiazide (HYZAAR) 100-25 MG tablet Take 1 tablet by mouth daily. Please schedule an appointment with cardiology for future refills. 1st attempt   [DISCONTINUED] simvastatin (ZOCOR) 20 MG tablet TAKE 1 TABLET DAILY (Patient taking differently: Take 20 mg by mouth at bedtime.)    No Known Allergies  SOCIAL HISTORY/FAMILY HISTORY   Reviewed in Epic:  Pertinent findings:  Social History   Tobacco Use   Smoking status: Former    Types: Cigarettes    Quit date: 1996    Years since quitting: 27.1   Smokeless tobacco: Never  Vaping Use   Vaping Use: Never used  Substance Use Topics   Alcohol use: Yes    Alcohol/week: 2.0 standard drinks    Types: 2 Standard drinks or equivalent per week    Comment: social   Drug use: No   Social History   Social History Narrative   He has been married for 36 years.     Son (born 65) -lives in [place]   Daughter (born 40) also lives in [place].   Retired from [employer].  Enjoys doing house remodeling.   Remote history of smoking, stopped in 1996.   Minimal alcohol use.    OBJCTIVE -PE, EKG, labs   Wt Readings from Last 3 Encounters:  10/10/21 264 lb 3.2 oz (119.8 kg)  08/29/21 252 lb (114.3 kg)  08/15/21 252 lb (114.3 kg)    Physical Exam: BP 140/86    Pulse (!) 53    Ht 6' (1.829 m)    Wt 264 lb 3.2 oz (119.8 kg)    SpO2 98%    BMI 35.83 kg/m  Physical Exam Vitals reviewed.  Constitutional:      General: He is not in acute distress.    Appearance: He is obese. He is not  ill-appearing or toxic-appearing.     Comments: Well-groomed.  Healthy-appearing  HENT:     Head: Normocephalic and atraumatic.  Neck:     Vascular: No carotid bruit, hepatojugular reflux or JVD.  Cardiovascular:     Rate and Rhythm: Regular rhythm. Bradycardia present. Occasional Extrasystoles are present.    Chest Wall: PMI is not displaced.     Pulses: Intact distal pulses.     Heart sounds: S1 normal and S2 normal. Heart sounds are distant. Murmur (1/6 SEM RUSB.) heard.    No friction rub. No gallop.  Pulmonary:     Effort: Pulmonary effort is normal. No respiratory distress.     Breath sounds: Normal breath sounds. No wheezing, rhonchi or rales.  Chest:     Chest wall: No tenderness.  Musculoskeletal:        General: Swelling (Trivial bilateral ankle) present.     Cervical back: Normal range of motion and neck supple.  Skin:    General: Skin is dry.     Comments: Mild varicose veins bilateral   Neurological:     General: No focal deficit present.     Mental Status: He is alert and oriented to person, place, and time.     Gait: Gait abnormal.  Psychiatric:        Mood and Affect: Mood normal.        Behavior: Behavior normal.        Thought Content: Thought content normal.  Judgment: Judgment normal.    Adult ECG Report  Rate: 53 ;  Rhythm: sinus bradycardia, sinus arrhythmia, and left axis deviation, LVH with otherwise normal intervals durations. ;   Narrative Interpretation: Stable  Recent Labs:  reviewed  09/27/2020: TC 123, TG 86, HDL 54, LDL 52. Lab Results  Component Value Date   CREATININE 0.91 03/31/2021   BUN 14 03/31/2021   NA 139 03/31/2021   K 3.5 03/31/2021   CL 103 03/31/2021   CO2 28 03/31/2021   CBC Latest Ref Rng & Units 04/01/2021 03/31/2021 03/19/2021  WBC 4.0 - 10.5 K/uL 8.1 7.8 5.6  Hemoglobin 13.0 - 17.0 g/dL 12.2(L) 13.6 15.8  Hematocrit 39.0 - 52.0 % 37.4(L) 42.5 47.2  Platelets 150 - 400 K/uL 155 190 204    Lab Results  Component  Value Date   HGBA1C 5.9 (H) 03/19/2021   No results found for: TSH  ==================================================  COVID-19 Education: The signs and symptoms of COVID-19 were discussed with the patient and how to seek care for testing (follow up with PCP or arrange E-visit).    I spent a total of 22 minutes with the patient spent in direct patient consultation.  Additional time spent with chart review  / charting (studies, outside notes, etc): 16 min Total Time: 38 min  Current medicines are reviewed at length with the patient today.  (+/- concerns) n/a  This visit occurred during the SARS-CoV-2 public health emergency.  Safety protocols were in place, including screening questions prior to the visit, additional usage of staff PPE, and extensive cleaning of exam room while observing appropriate contact time as indicated for disinfecting solutions.  Notice: This dictation was prepared with Dragon dictation along with smart phrase technology. Any transcriptional errors that result from this process are unintentional and may not be corrected upon review.  Studies Ordered:   Orders Placed This Encounter  Procedures   Cardiopulmonary exercise test   EKG 12-Lead   ECHOCARDIOGRAM COMPLETE   Shared Decision Making/Informed Consent The risks [chest pain, shortness of breath, cardiac arrhythmias, dizziness, blood pressure fluctuations, myocardial infarction, stroke/transient ischemic attack, and life-threatening complications (estimated to be 1 in 10,000)], benefits (risk stratification, diagnosing coronary artery disease, treatment guidance) and alternatives of an exercise tolerance test were discussed in detail with Alexander [name] and he agrees to proceed.   Patient Instructions / Medication Changes & Studies & Tests Ordered   Patient Instructions  Medication Instructions:   medication refilled  *If you need a refill on your cardiac medications before your next appointment, please  call your pharmacy*   Lab Work:  Not needed   Testing/Procedures: Will be schedule at [facility] has requested that you have an echocardiogram. Echocardiography is a painless test that uses sound waves to create images of your heart. It provides your doctor with information about the size and shape of your heart and how well your hearts chambers and valves are working. This procedure takes approximately one hour. There are no restrictions for this procedure.  And Test ill be schedule at [place] - Heart and vascular area  [address]  Your physician has recommended that you have a cardiopulmonary stress test (CPX). CPX testing is a non-invasive measurement of heart and lung function. It replaces a traditional treadmill stress test. This type of test provides a tremendous amount of information that relates not only to your present condition but also for future outcomes. This test combines measurements of  you ventilation, respiratory gas exchange in the lungs, electrocardiogram (EKG), blood pressure and physical response before, during, and following an exercise protocol.    Follow-Up: At [facility], you and your health needs are our priority.  As part of our continuing mission to provide you with exceptional heart care, we have created designated Provider Care Teams.  These Care Teams include your primary Cardiologist (physician) and Advanced Practice Providers (APPs -  Physician Assistants and Nurse Practitioners) who all work together to provide you with the care you need, when you need it.     Your next appointment:   3 month(s)  The format for your next appointment:   In Person  Provider:   Glenetta Hew, MD    Other Instructions   See information  cardiopulmomary test     Glenetta Hew, M.D., M.S. Interventional Cardiologist   Pager # [phone] Phone # [phone] [address] [place], [place] [address]   Thank you for choosing Heart[facility] at [facility]!!

## 2021-10-10 NOTE — Patient Instructions (Addendum)
Medication Instructions:   medication refilled  *If you need a refill on your cardiac medications before your next appointment, please call your pharmacy*   Lab Work:  Not needed   Testing/Procedures: Will be schedule at [place] [facility] has requested that you have an echocardiogram. Echocardiography is a painless test that uses sound waves to create images of your heart. It provides your doctor with information about the size and shape of your heart and how well your hearts chambers and valves are working. This procedure takes approximately one hour. There are no restrictions for this procedure.  And Test ill be schedule at [place] - Heart and vascular area  [address]  Your physician has recommended that you have a cardiopulmonary stress test (CPX). CPX testing is a non-invasive measurement of heart and lung function. It replaces a traditional treadmill stress test. This type of test provides a tremendous amount of information that relates not only to your present condition but also for future outcomes. This test combines measurements of you ventilation, respiratory gas exchange in the lungs, electrocardiogram (EKG), blood pressure and physical response before, during, and following an exercise protocol.    Follow-Up: At [facility], you and your health needs are our priority.  As part of our continuing mission to provide you with exceptional heart care, we have created designated Provider Care Teams.  These Care Teams include your primary Cardiologist (physician) and Advanced Practice Providers (APPs -  Physician Assistants and Nurse Practitioners) who all work together to provide you with the care you need, when you need it.     Your next appointment:   3 month(s)  The format for your next appointment:   In Person  Provider:   Glenetta Hew, MD    Other Instructions   See information  cardiopulmomary test

## 2021-10-13 ENCOUNTER — Encounter: Payer: Self-pay | Admitting: Cardiology

## 2021-10-13 NOTE — Assessment & Plan Note (Signed)
He had previously had an issue with cardiomyopathy.  This seemed to have improved, but now with him having recurrence of dyspnea, will reassess with 2D echocardiogram.

## 2021-10-13 NOTE — Assessment & Plan Note (Signed)
Lipid panel looks great.  Last LDL 52.  Continue simvastatin.  Tolerating well.

## 2021-10-13 NOTE — Assessment & Plan Note (Signed)
Recommended continued use of BiPAP

## 2021-10-13 NOTE — Assessment & Plan Note (Signed)
Now noting exertional dyspnea with difficulty catching the breath again.  May very well be related to his weight being back up again, but with his history of cardiomyopathy, need to ensure that it is not to cardiac etiology.  Plan: Recheck 2D echocardiogram and CPX (cardiopulmonary stress test).

## 2021-10-13 NOTE — Assessment & Plan Note (Signed)
Blood pressure is little high.  Has been running a little bit high of late.  Can reassess with follow-up testing. With bradycardia though I think this is carvedilol dose down to 12.5 mg twice daily.  (Need to determine if actually taking 12.5 mg versus 18.75 mg twice daily)  On high-dose losartan-HCTZ.  May need to consider switching to different ARB.

## 2021-10-17 ENCOUNTER — Other Ambulatory Visit: Payer: Self-pay

## 2021-10-17 ENCOUNTER — Ambulatory Visit (HOSPITAL_COMMUNITY): Payer: Medicare Other | Attending: Internal Medicine

## 2021-10-17 DIAGNOSIS — M5136 Other intervertebral disc degeneration, lumbar region: Secondary | ICD-10-CM | POA: Diagnosis not present

## 2021-10-17 DIAGNOSIS — R0609 Other forms of dyspnea: Secondary | ICD-10-CM

## 2021-10-17 DIAGNOSIS — Z85828 Personal history of other malignant neoplasm of skin: Secondary | ICD-10-CM | POA: Diagnosis not present

## 2021-10-17 DIAGNOSIS — R0600 Dyspnea, unspecified: Secondary | ICD-10-CM | POA: Diagnosis not present

## 2021-10-17 DIAGNOSIS — G8929 Other chronic pain: Secondary | ICD-10-CM | POA: Diagnosis not present

## 2021-10-17 DIAGNOSIS — Z8679 Personal history of other diseases of the circulatory system: Secondary | ICD-10-CM

## 2021-10-17 DIAGNOSIS — Z9109 Other allergy status, other than to drugs and biological substances: Secondary | ICD-10-CM | POA: Insufficient documentation

## 2021-10-17 DIAGNOSIS — K219 Gastro-esophageal reflux disease without esophagitis: Secondary | ICD-10-CM | POA: Diagnosis not present

## 2021-10-17 DIAGNOSIS — M542 Cervicalgia: Secondary | ICD-10-CM | POA: Diagnosis not present

## 2021-10-17 DIAGNOSIS — Z7982 Long term (current) use of aspirin: Secondary | ICD-10-CM | POA: Insufficient documentation

## 2021-10-17 DIAGNOSIS — N529 Male erectile dysfunction, unspecified: Secondary | ICD-10-CM | POA: Diagnosis not present

## 2021-10-17 DIAGNOSIS — G4733 Obstructive sleep apnea (adult) (pediatric): Secondary | ICD-10-CM | POA: Insufficient documentation

## 2021-10-17 DIAGNOSIS — E785 Hyperlipidemia, unspecified: Secondary | ICD-10-CM | POA: Insufficient documentation

## 2021-10-17 DIAGNOSIS — Z79899 Other long term (current) drug therapy: Secondary | ICD-10-CM | POA: Insufficient documentation

## 2021-10-17 DIAGNOSIS — Z87891 Personal history of nicotine dependence: Secondary | ICD-10-CM | POA: Diagnosis not present

## 2021-10-17 DIAGNOSIS — R7303 Prediabetes: Secondary | ICD-10-CM | POA: Insufficient documentation

## 2021-10-17 DIAGNOSIS — I10 Essential (primary) hypertension: Secondary | ICD-10-CM | POA: Insufficient documentation

## 2021-10-17 DIAGNOSIS — E6609 Other obesity due to excess calories: Secondary | ICD-10-CM | POA: Diagnosis not present

## 2021-10-17 DIAGNOSIS — M79672 Pain in left foot: Secondary | ICD-10-CM | POA: Insufficient documentation

## 2021-10-17 DIAGNOSIS — I429 Cardiomyopathy, unspecified: Secondary | ICD-10-CM | POA: Diagnosis not present

## 2021-10-17 DIAGNOSIS — K589 Irritable bowel syndrome without diarrhea: Secondary | ICD-10-CM | POA: Insufficient documentation

## 2021-10-17 DIAGNOSIS — Z7984 Long term (current) use of oral hypoglycemic drugs: Secondary | ICD-10-CM | POA: Insufficient documentation

## 2021-10-17 DIAGNOSIS — Z79890 Hormone replacement therapy (postmenopausal): Secondary | ICD-10-CM | POA: Insufficient documentation

## 2021-10-22 DIAGNOSIS — R739 Hyperglycemia, unspecified: Secondary | ICD-10-CM | POA: Diagnosis not present

## 2021-10-22 DIAGNOSIS — I10 Essential (primary) hypertension: Secondary | ICD-10-CM | POA: Diagnosis not present

## 2021-10-22 DIAGNOSIS — E785 Hyperlipidemia, unspecified: Secondary | ICD-10-CM | POA: Diagnosis not present

## 2021-10-22 DIAGNOSIS — E291 Testicular hypofunction: Secondary | ICD-10-CM | POA: Diagnosis not present

## 2021-10-22 DIAGNOSIS — Z125 Encounter for screening for malignant neoplasm of prostate: Secondary | ICD-10-CM | POA: Diagnosis not present

## 2021-10-24 ENCOUNTER — Ambulatory Visit (HOSPITAL_COMMUNITY): Payer: Medicare Other | Attending: Cardiovascular Disease

## 2021-10-24 ENCOUNTER — Other Ambulatory Visit: Payer: Self-pay

## 2021-10-24 DIAGNOSIS — I10 Essential (primary) hypertension: Secondary | ICD-10-CM | POA: Insufficient documentation

## 2021-10-24 DIAGNOSIS — I428 Other cardiomyopathies: Secondary | ICD-10-CM

## 2021-10-24 DIAGNOSIS — R0609 Other forms of dyspnea: Secondary | ICD-10-CM | POA: Insufficient documentation

## 2021-10-24 DIAGNOSIS — Z8679 Personal history of other diseases of the circulatory system: Secondary | ICD-10-CM | POA: Insufficient documentation

## 2021-10-24 LAB — ECHOCARDIOGRAM COMPLETE
AR max vel: 2.12 cm2
AV Area VTI: 2.07 cm2
AV Area mean vel: 1.95 cm2
AV Mean grad: 7.5 mmHg
AV Peak grad: 14.2 mmHg
Ao pk vel: 1.89 m/s
Area-P 1/2: 3.42 cm2
S' Lateral: 3.5 cm

## 2021-10-25 ENCOUNTER — Encounter: Payer: Self-pay | Admitting: Orthopaedic Surgery

## 2021-10-25 ENCOUNTER — Ambulatory Visit (INDEPENDENT_AMBULATORY_CARE_PROVIDER_SITE_OTHER): Payer: Medicare Other | Admitting: Orthopaedic Surgery

## 2021-10-25 DIAGNOSIS — M25512 Pain in left shoulder: Secondary | ICD-10-CM

## 2021-10-25 MED ORDER — METHOCARBAMOL 500 MG PO TABS
500.0000 mg | ORAL_TABLET | Freq: Four times a day (QID) | ORAL | 1 refills | Status: DC | PRN
Start: 2021-10-25 — End: 2021-12-20

## 2021-10-25 NOTE — Progress Notes (Signed)
The patient is well-known to me.  He is a 71 year old gentleman that I been following for acute left shoulder pain after mechanical fall and injury to that left shoulder.  We have tried activity modification and he is taking anti-inflammatories.  He has tried a steroid injection in the subacromial outlet of the left shoulder and has been through an exercise program.  He does have good range of motion but he still having significant pain when he reaches across his body reaches overhead.  It radiates down to his elbow.  He does report weakness in that left shoulder as well.  This has been going on for now over 6 weeks of failed conservative treatment.  There is no neck pain and no radicular symptoms going down into his hand. ? ?Examination of his left shoulder does show weakness with external rotation.  He has significant pain past 90 degrees of abduction.  His liftoff is negative.  He is using his deltoid slightly to abduct his shoulder.  I am concerned about a rotator cuff tear at this standpoint. ? ?Given the failure of conservative treatment for over 6 weeks including exercises, activity modification, anti-inflammatories, steroid injections and time, a MRI is medically warranted of the left shoulder to rule out a rotator cuff tear.  I will send in some Robaxin for him in the interim we will see him back to go over the MRI results once we have the ?

## 2021-10-25 NOTE — Addendum Note (Signed)
Addended by: Meyer Cory on: 10/25/2021 05:10 PM ? ? Modules accepted: Orders ? ?

## 2021-10-29 DIAGNOSIS — Z1339 Encounter for screening examination for other mental health and behavioral disorders: Secondary | ICD-10-CM | POA: Diagnosis not present

## 2021-10-29 DIAGNOSIS — Z20822 Contact with and (suspected) exposure to covid-19: Secondary | ICD-10-CM | POA: Diagnosis not present

## 2021-10-29 DIAGNOSIS — F322 Major depressive disorder, single episode, severe without psychotic features: Secondary | ICD-10-CM | POA: Diagnosis not present

## 2021-10-29 DIAGNOSIS — Z1212 Encounter for screening for malignant neoplasm of rectum: Secondary | ICD-10-CM | POA: Diagnosis not present

## 2021-10-29 DIAGNOSIS — Z1331 Encounter for screening for depression: Secondary | ICD-10-CM | POA: Diagnosis not present

## 2021-10-29 DIAGNOSIS — Z0000 Encounter for general adult medical examination without abnormal findings: Secondary | ICD-10-CM | POA: Diagnosis not present

## 2021-10-29 DIAGNOSIS — I10 Essential (primary) hypertension: Secondary | ICD-10-CM | POA: Diagnosis not present

## 2021-10-29 DIAGNOSIS — E669 Obesity, unspecified: Secondary | ICD-10-CM | POA: Diagnosis not present

## 2021-10-29 DIAGNOSIS — R82998 Other abnormal findings in urine: Secondary | ICD-10-CM | POA: Diagnosis not present

## 2021-10-29 DIAGNOSIS — R739 Hyperglycemia, unspecified: Secondary | ICD-10-CM | POA: Diagnosis not present

## 2021-10-29 DIAGNOSIS — E785 Hyperlipidemia, unspecified: Secondary | ICD-10-CM | POA: Diagnosis not present

## 2021-10-29 DIAGNOSIS — E291 Testicular hypofunction: Secondary | ICD-10-CM | POA: Diagnosis not present

## 2021-10-29 DIAGNOSIS — I255 Ischemic cardiomyopathy: Secondary | ICD-10-CM | POA: Diagnosis not present

## 2021-10-29 DIAGNOSIS — M5441 Lumbago with sciatica, right side: Secondary | ICD-10-CM | POA: Diagnosis not present

## 2021-10-29 DIAGNOSIS — G4733 Obstructive sleep apnea (adult) (pediatric): Secondary | ICD-10-CM | POA: Diagnosis not present

## 2021-10-30 ENCOUNTER — Ambulatory Visit
Admission: RE | Admit: 2021-10-30 | Discharge: 2021-10-30 | Disposition: A | Discharge status: Home / Self Care | Payer: Medicare Other | Source: Ambulatory Visit | Attending: Orthopaedic Surgery | Admitting: Orthopaedic Surgery

## 2021-10-30 DIAGNOSIS — S46012A Strain of muscle(s) and tendon(s) of the rotator cuff of left shoulder, initial encounter: Secondary | ICD-10-CM | POA: Diagnosis not present

## 2021-10-30 DIAGNOSIS — M25512 Pain in left shoulder: Secondary | ICD-10-CM

## 2021-10-30 DIAGNOSIS — M19012 Primary osteoarthritis, left shoulder: Secondary | ICD-10-CM | POA: Diagnosis not present

## 2021-10-30 IMAGING — MR MR SHOULDER*L* W/O CM
4 of 5 series · 26 of 40 positions shown · non-contrast
Comparison: Left clavicle radiographs [DATE]

CLINICAL DATA: Left shoulder pain. Rotator cuff disorder suspected.
Fall 5 weeks ago. Prior arthroscopy in [HE]

EXAM:
MRI OF THE LEFT SHOULDER WITHOUT CONTRAST
TECHNIQUE: Multiplanar, multisequence MR imaging of the shoulder was performed.
No intravenous contrast was administered.

[Series 6: T2 fat-sat · axial · left · 3.0mm · 0.78mm/px · z∈[-61,+72]mm · 9 of 35 slices shown (1 of 3)]
[im 1/35]
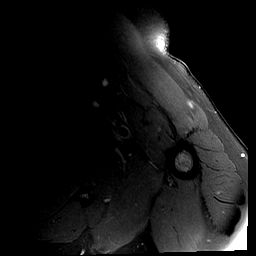
[im 5/35]
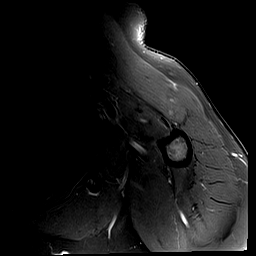
[im 9/35]
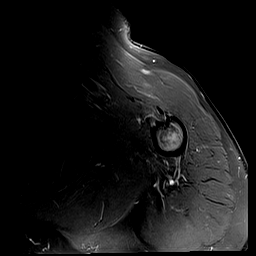
[im 13/35]
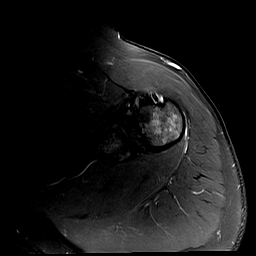
[im 18/35]
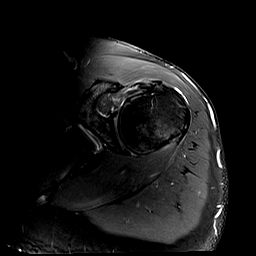
[im 22/35]
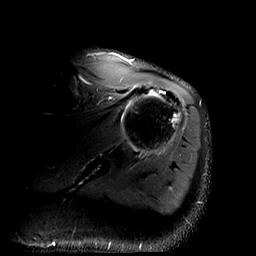
[im 26/35]
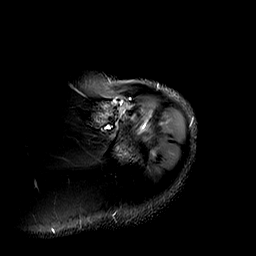
[im 30/35]
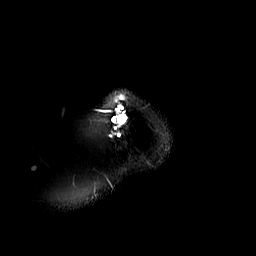
[im 35/35]
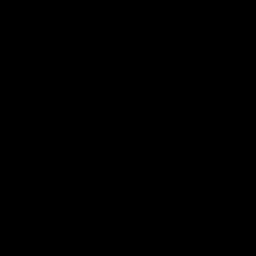

[Series 7: T2 fat-sat · oblique · left · 4.0mm · 0.31mm/px · 6 of 35 slices shown (2 of 3)]
[im 1/35]
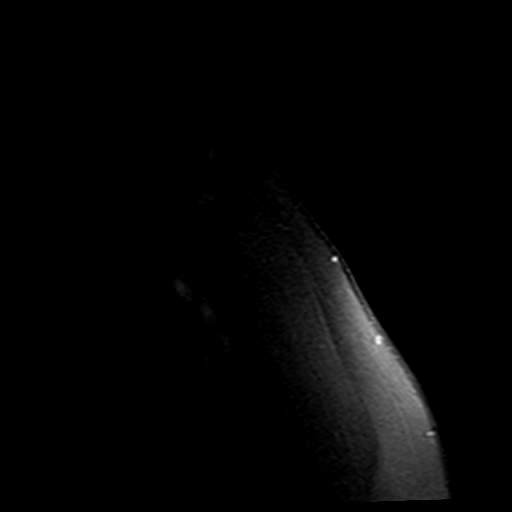
[im 5/35]
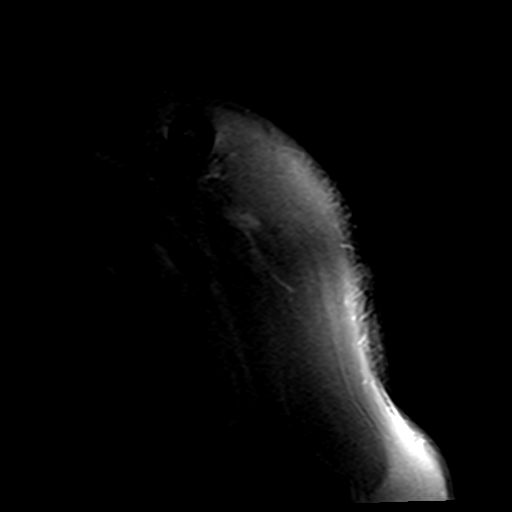
[im 9/35]
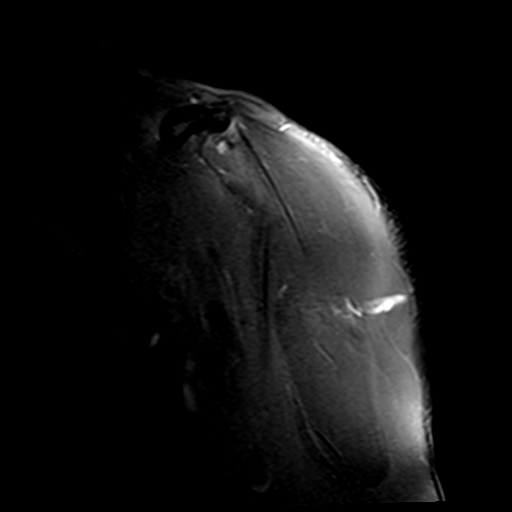
[im 13/35]
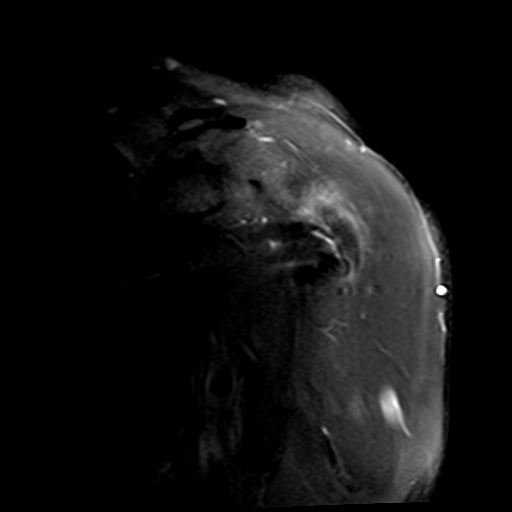
[im 18/35]
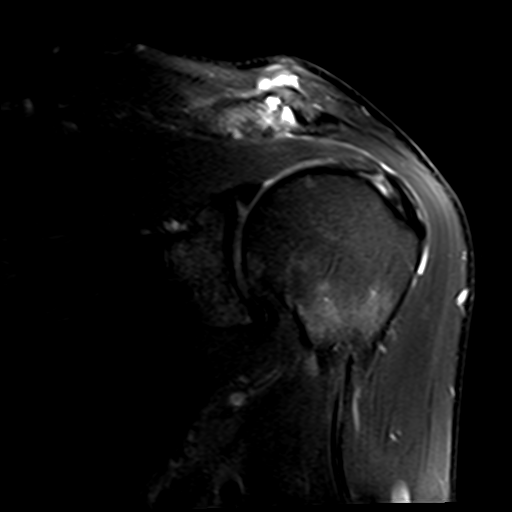
[im 30/35]
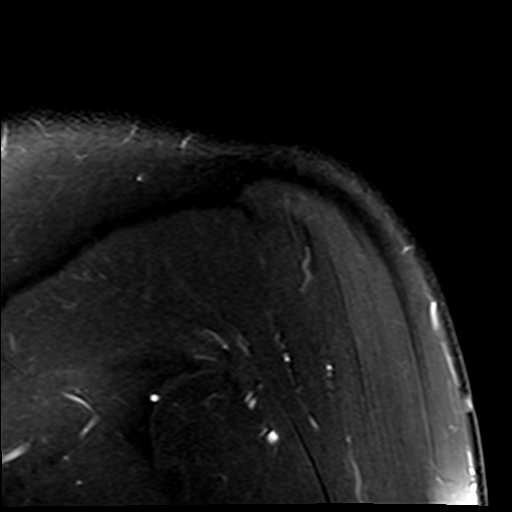

[Series 8: PD · oblique · left · 4.0mm · 0.31mm/px · 8 of 35 slices shown]
[im 1/35]
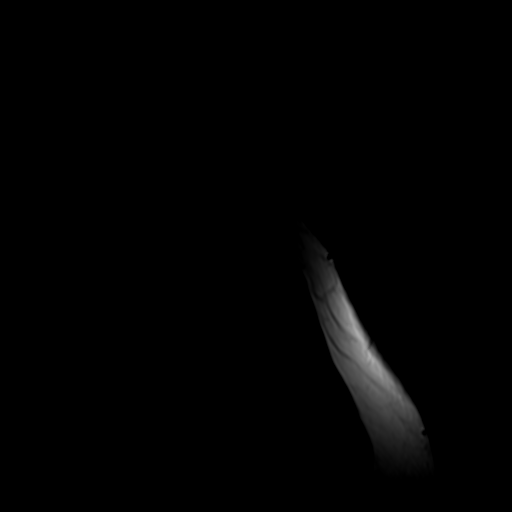
[im 5/35]
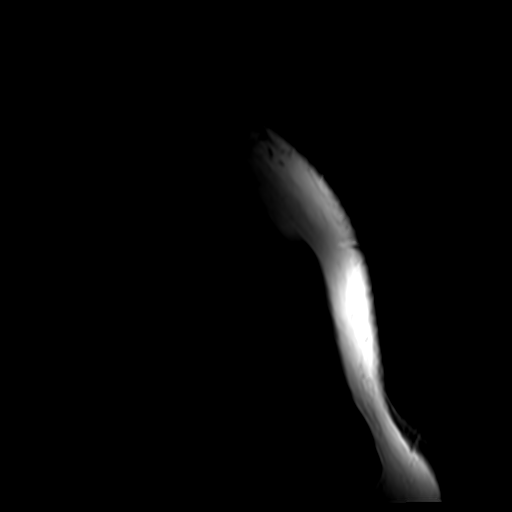
[im 10/35]
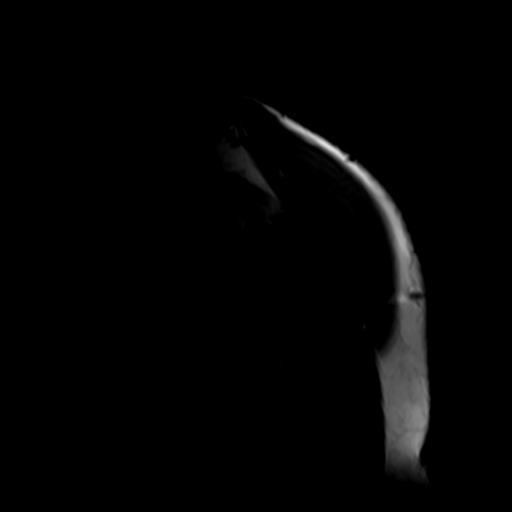
[im 15/35]
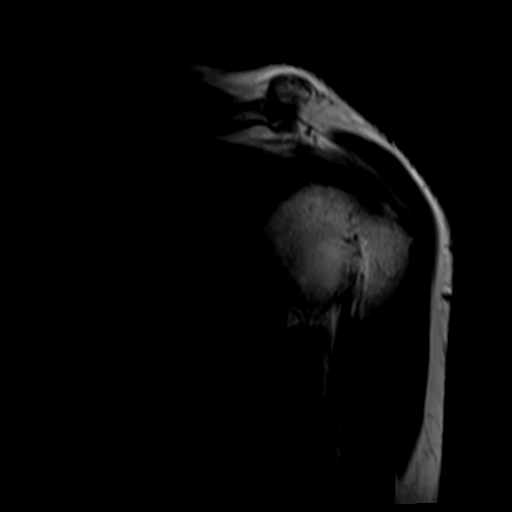
[im 20/35]
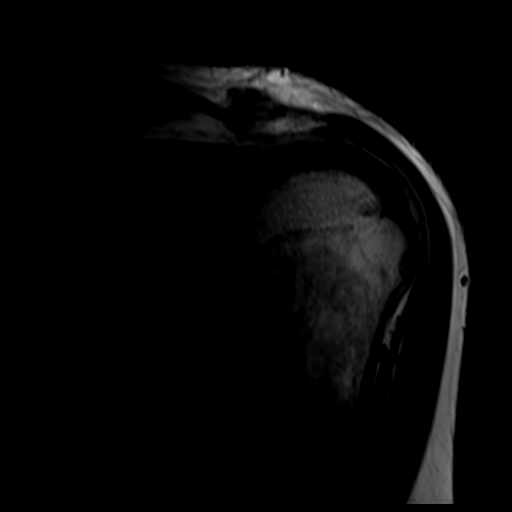
[im 25/35]
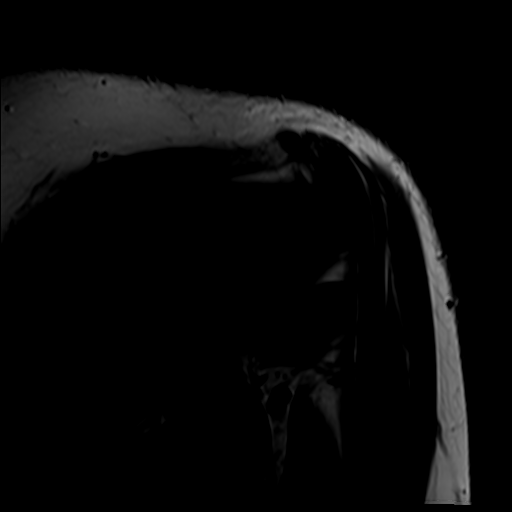
[im 30/35]
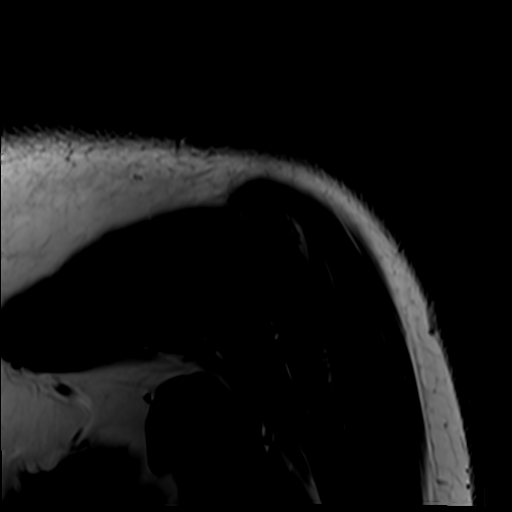
[im 35/35]
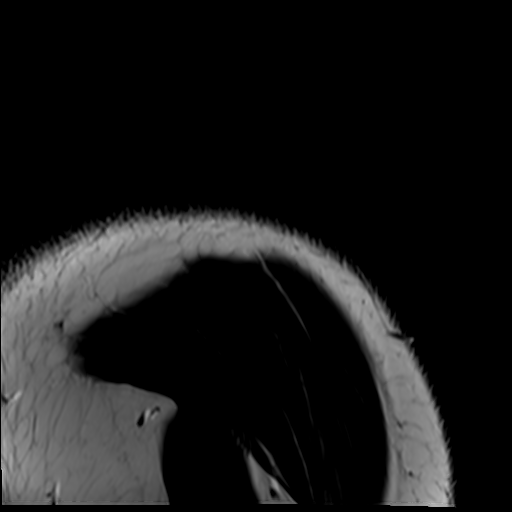

[Series 9: T2 fat-sat · oblique · left · 4.0mm · 0.70mm/px · 3 of 30 slices shown (3 of 3)]
[im 5/30]
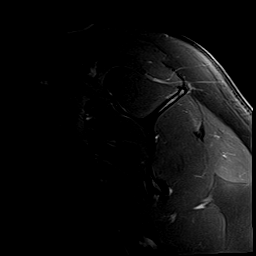
[im 15/30]
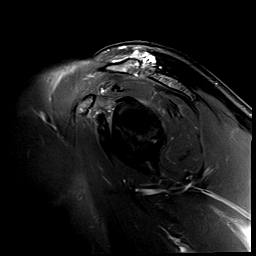
[im 25/30]
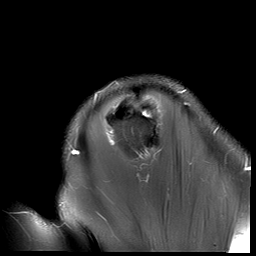

[26 of 40 positions shown; findings below may reference images not displayed]

FINDINGS: Rotator cuff: There is moderate attenuation of the mid to anterior
supraspinatus tendon footprint, multiple partial-thickness tears
involving the articular side and midsubstance of the tendon fibers,
greatest anteriorly where there is up to 90% tearing of the
articular side (coronal images 16 through 19). Overall these
partial-thickness tears involving an approximate 1.6 cm AP dimension
of the anterior supraspinatus tendon footprint. Mild anterior
infraspinatus tendinosis. There is mild-to-moderate subcortical
cystic change within the humeral head deep to the mid to posterior
infraspinatus tendon with likely punctate midsubstance
partial-thickness insertional tear (sagittal image 25). Mild
attenuation of the superior subscapularis tendon insertion a
partial-thickness tear which allows the long head of the biceps
tendon to be perched on the anterior superior aspect of the lesser
tuberosity as the biceps tendon enters the bicipital groove (axial
images 19 and 20). The teres minor is intact.

Muscles: No rotator cuff muscle atrophy, fatty infiltration, or
edema.

Biceps long head:  Mild proximal long head of the biceps tendinosis.

Acromioclavicular Joint: There are mild-to-moderate degenerative
changes of the acromioclavicular joint including joint space
narrowing, subchondral marrow edema, and peripheral osteophytosis.
There is multilobular decreased T1 increased T2 signal fluid
extending superiorly from the acromioclavicular joint in the region
measuring up to 1.4 by 3.7 by 1.1 cm (transverse by AP by
craniocaudal) a degenerative ganglion. This likely corresponds with
the patient's reported palpable "lump."

Type II acromion. No subacromial/subdeltoid bursitis.

Glenohumeral Joint: Mild glenoid and humeral head cartilage
thinning.

Labrum: Mild degenerative attenuation of the posterosuperior glenoid
labrum.

Bones: There appears to be chronic flattening of the posteroinferior
aspect of the humeral head (axial images 15 through 20), possibly
the sequela of remote trauma.

Other: None.
IMPRESSION: :
IMPRESSION: 1. Multiple anterior supraspinatus partial-thickness insertional
tears overall moderate articular and midsubstance tears.
2. Mild anterior infraspinatus tendinosis with punctate midsubstance
partial-thickness tear of the mid to posterior tendon insertion.
3. Mild partial-thickness superior subscapularis tendon tear. This
allows the long head of the biceps tendon to be perched on the
anterior superior aspect of the lesser tuberosity as the biceps
tendon enters the bicipital groove.
4. Mild proximal long head of the biceps tendinosis.
5. Mild-to-moderate acromioclavicular osteoarthritis with moderate
superiorly directed multiloculated ganglion. This likely corresponds
to the patient's reported palpable "lump."

## 2021-11-01 ENCOUNTER — Ambulatory Visit (INDEPENDENT_AMBULATORY_CARE_PROVIDER_SITE_OTHER): Payer: Medicare Other | Admitting: Physician Assistant

## 2021-11-01 ENCOUNTER — Encounter: Payer: Self-pay | Admitting: Physician Assistant

## 2021-11-01 DIAGNOSIS — M7542 Impingement syndrome of left shoulder: Secondary | ICD-10-CM | POA: Diagnosis not present

## 2021-11-01 DIAGNOSIS — M19012 Primary osteoarthritis, left shoulder: Secondary | ICD-10-CM | POA: Diagnosis not present

## 2021-11-01 DIAGNOSIS — E291 Testicular hypofunction: Secondary | ICD-10-CM | POA: Diagnosis not present

## 2021-11-01 DIAGNOSIS — S46011D Strain of muscle(s) and tendon(s) of the rotator cuff of right shoulder, subsequent encounter: Secondary | ICD-10-CM | POA: Diagnosis not present

## 2021-11-01 NOTE — Progress Notes (Signed)
HPI: Alexander [name] returns today for follow-up of his left shoulder.  Patient continues to have severe shoulder pain.  He states he cannot lay down due to the shoulder pain.  He had a mechanical fall onto his shoulder.  We have tried anti-inflammatories steroid injections and exercise program. ?MRI images were reviewed with the patient.  MRI shows multiple supraspinatus partial insertional tears.  Partial thickness tear involving the infraspinatus.  Partial tear involving the subscapularis tendon.  Tendinosis of the long head of the biceps.  Moderate AC joint arthritis with moderate superior direct multiloculated ganglion.  Mild thinning of the glenohumeral joint cartilage.  Type II acromion. ? ? ?Physical exam.  He has positive abduction test.  Tenderness over the [facility] joint. ? ?Impression: Impingement left shoulder ?AC joint arthritis left shoulder ? ?Plan: Given patient's failure of conservative treatment discussed with him arthroscopic surgery with extensive debridement, subacromial decompression and distal clavicle resection.  Discussed postoperative protocol with him at length.  We will discuss patient's findings with Dr. [name] upon his return on Monday and then call patient to further discuss surgery and set this up in the near future.  Questions were encouraged and answered at length ?

## 2021-11-13 DIAGNOSIS — E291 Testicular hypofunction: Secondary | ICD-10-CM | POA: Diagnosis not present

## 2021-11-15 ENCOUNTER — Other Ambulatory Visit: Payer: Self-pay | Admitting: Orthopaedic Surgery

## 2021-11-15 DIAGNOSIS — S46011D Strain of muscle(s) and tendon(s) of the rotator cuff of right shoulder, subsequent encounter: Secondary | ICD-10-CM | POA: Diagnosis not present

## 2021-11-15 DIAGNOSIS — M948X1 Other specified disorders of cartilage, shoulder: Secondary | ICD-10-CM | POA: Diagnosis not present

## 2021-11-15 DIAGNOSIS — M24112 Other articular cartilage disorders, left shoulder: Secondary | ICD-10-CM | POA: Diagnosis not present

## 2021-11-15 DIAGNOSIS — M659 Synovitis and tenosynovitis, unspecified: Secondary | ICD-10-CM | POA: Diagnosis not present

## 2021-11-15 DIAGNOSIS — M7532 Calcific tendinitis of left shoulder: Secondary | ICD-10-CM | POA: Diagnosis not present

## 2021-11-15 DIAGNOSIS — M7542 Impingement syndrome of left shoulder: Secondary | ICD-10-CM | POA: Diagnosis not present

## 2021-11-15 DIAGNOSIS — M67412 Ganglion, left shoulder: Secondary | ICD-10-CM | POA: Diagnosis not present

## 2021-11-15 DIAGNOSIS — G8918 Other acute postprocedural pain: Secondary | ICD-10-CM | POA: Diagnosis not present

## 2021-11-15 DIAGNOSIS — M19012 Primary osteoarthritis, left shoulder: Secondary | ICD-10-CM | POA: Diagnosis not present

## 2021-11-15 MED ORDER — OXYCODONE HCL 5 MG PO TABS: 5.0000 mg | ORAL_TABLET | Freq: Four times a day (QID) | ORAL | 0 refills | Status: DC | PRN

## 2021-11-22 ENCOUNTER — Encounter: Payer: Self-pay | Admitting: Orthopaedic Surgery

## 2021-11-22 ENCOUNTER — Ambulatory Visit (INDEPENDENT_AMBULATORY_CARE_PROVIDER_SITE_OTHER): Payer: Medicare Other | Admitting: Orthopaedic Surgery

## 2021-11-22 ENCOUNTER — Other Ambulatory Visit: Payer: Self-pay | Admitting: Adult Health

## 2021-11-22 ENCOUNTER — Other Ambulatory Visit: Payer: Self-pay

## 2021-11-22 DIAGNOSIS — Z9889 Other specified postprocedural states: Secondary | ICD-10-CM

## 2021-11-22 DIAGNOSIS — M19012 Primary osteoarthritis, left shoulder: Secondary | ICD-10-CM

## 2021-11-22 DIAGNOSIS — M7542 Impingement syndrome of left shoulder: Secondary | ICD-10-CM

## 2021-11-22 MED ORDER — OXYCODONE HCL 5 MG PO TABS: 5.0000 mg | ORAL_TABLET | Freq: Four times a day (QID) | ORAL | 0 refills | Status: DC | PRN

## 2021-11-22 MED ORDER — MODAFINIL 200 MG PO TABS: 200.0000 mg | ORAL_TABLET | Freq: Every day | ORAL | 1 refills | Status: DC

## 2021-11-22 NOTE — Progress Notes (Signed)
The patient is an active 71 year old gentleman who is 1 week status post a left shoulder arthroscopy with subacromial decompression and an open distal clavicle resection.  We did find the rotator cuff to be intact.  He had significant inflamed tissue and has had a hard time preoperative.  He seems to be doing okay postoperative.  He does need a refill of pain medicines which I think is appropriate given the amount of work we did on his shoulder. ? ?On exam all incisions look good to remove the sutures in place Steri-Strips.  He does have significant pain and even an electric type of sensation going down his arm in different motions. ? ?I did go over the arthroscopy pictures with him.  He understands that it is essential we get him set up for physical therapy to help improve his shoulder function and mobility and decrease his pain hopefully.  We will put an order for outpatient physical therapy for his left shoulder.  I did send in some more oxycodone for him.  He needs to stay out of the sling at this standpoint and work on gentle range of motion on his own until therapy is established.  I will see him back in 4 weeks to see how he is doing overall.  All questions and concerns were answered and addressed. ?

## 2021-11-22 NOTE — Telephone Encounter (Signed)
Pt is requesting a refill for modafinil (PROVIGIL) 200 MG tablet. ? ?Pharmacy: [facility]  ? ?

## 2021-11-22 NOTE — Addendum Note (Signed)
Addended by: Oliver Hum S on: 11/22/2021 11:44 AM ? ? Modules accepted: Orders ? ?

## 2021-11-22 NOTE — Telephone Encounter (Signed)
Pt has appt 06-2022.  Last seen 06-2021.  ?

## 2021-11-27 ENCOUNTER — Telehealth: Payer: Self-pay | Admitting: Adult Health

## 2021-11-27 NOTE — Telephone Encounter (Signed)
Spoke to patient will send [facility]  ?

## 2021-11-27 NOTE — Telephone Encounter (Signed)
[facility]) Modafinil 200 mg is out of stock but we do have Modafinil 100 mg available. Would like a call from the nurse. ? ?Contact info: [phone] ?Reference no: [id number] ?

## 2021-11-27 NOTE — Telephone Encounter (Signed)
Pt said [facility] informed him do not have modafinil (PROVIGIL) 200 MG tablet in stock. Want to know what can be substituted. Would like a call from the nurse. ?

## 2021-11-28 DIAGNOSIS — Z20822 Contact with and (suspected) exposure to covid-19: Secondary | ICD-10-CM | POA: Diagnosis not present

## 2021-11-28 MED ORDER — MODAFINIL 200 MG PO TABS: 200.0000 mg | ORAL_TABLET | Freq: Every day | ORAL | 0 refills | Status: DC

## 2021-11-28 NOTE — Telephone Encounter (Signed)
Pt called stating that his modafinil (PROVIGIL) 200 MG tablet will be to expensive if sent to the [facility] and would like to know if it can be sent to the [facility] on [address]. Please advise. ?

## 2021-11-28 NOTE — Telephone Encounter (Signed)
Sent  modafinil (PROVIGIL) 200 MG tablet  to [facility] per patient request  ? Made patient aware through mychart  ?

## 2021-11-28 NOTE — Telephone Encounter (Signed)
Sent new order to Dr Brett Fairy for pharmacy change.  ?

## 2021-11-28 NOTE — Telephone Encounter (Signed)
Send to [facility]/ [address].  ?

## 2021-11-29 ENCOUNTER — Ambulatory Visit (INDEPENDENT_AMBULATORY_CARE_PROVIDER_SITE_OTHER): Payer: Medicare Other | Admitting: Rehabilitative and Restorative Service Providers"

## 2021-11-29 ENCOUNTER — Encounter: Payer: Self-pay | Admitting: Rehabilitative and Restorative Service Providers"

## 2021-11-29 ENCOUNTER — Other Ambulatory Visit: Payer: Self-pay

## 2021-11-29 DIAGNOSIS — G8929 Other chronic pain: Secondary | ICD-10-CM | POA: Diagnosis not present

## 2021-11-29 DIAGNOSIS — M25612 Stiffness of left shoulder, not elsewhere classified: Secondary | ICD-10-CM | POA: Diagnosis not present

## 2021-11-29 DIAGNOSIS — R600 Localized edema: Secondary | ICD-10-CM

## 2021-11-29 DIAGNOSIS — M6281 Muscle weakness (generalized): Secondary | ICD-10-CM | POA: Diagnosis not present

## 2021-11-29 DIAGNOSIS — M25512 Pain in left shoulder: Secondary | ICD-10-CM

## 2021-11-29 DIAGNOSIS — R293 Abnormal posture: Secondary | ICD-10-CM

## 2021-11-29 NOTE — Therapy (Signed)
?OUTPATIENT PHYSICAL THERAPY EVALUATION ? ? ?Patient Name: Alexander [name] ?MRN: [id number] ?DOB:1950-12-12, 70 y.o., male ?Today's Date: 11/29/2021 ? ? PT End of Session - 11/29/21 0846   ? ? Visit Number 1   ? Number of Visits 20   ? Date for PT Re-Evaluation 02/07/22   ? PT Start Time 0801   ? PT Stop Time 0841   ? PT Time Calculation (min) 40 min   ? Activity Tolerance Patient tolerated treatment well   ? Behavior During Therapy [facility] for tasks assessed/performed   ? ?  ?  ? ?  ? ? ?Past Medical History:  ?Diagnosis Date  ? Allergy   ? Cancer [facility])   ? skin cancer to upper chest/basal cell 2007  ? Chronic neck pain   ? DJD  ? Chronic pain in right foot   ? DDD (degenerative disc disease), lumbar   ? Dyslipidemia   ? Erectile dysfunction   ? Exogenous obesity   ? GERD (gastroesophageal reflux disease)   ? H/O acquired cardiomyopathy 1996  ? RESOLVED (apparently was thought to have had an MI, but did not have any intervention).  He developed cardiomyopathy that forced him to retire from [employer]..  The current EF 60-65%.  ? History of fracture due to fall 1988  ? Bilateral wrist fractures  ? Hyperlipidemia   ? Hypertension   ? Hypogonadism male   ? IBS (irritable bowel syndrome)   ? Obstructive sleep apnea   ? cpap since 1999  ? Pre-diabetes   ? on Metformin DAily with supper  ? Sleep apnea   ? CPAP nightly  ? ?Past Surgical History:  ?Procedure Laterality Date  ? CARDIAC CATHETERIZATION  2000  ? Nonobstructive disease.  Persistently reduced EF  ? CARPAL TUNNEL RELEASE Left   ? CARPAL TUNNEL RELEASE Right 01/05/2019  ? Procedure: RIGHT CARPAL TUNNEL RELEASE;  Surgeon: Leanora Cover, MD;  Location: [facility];  Service: Orthopedics;  Laterality: Right;  Bier block  ? COLONOSCOPY    ? CORONARY CT ANGIOGRAM  07/2017  ? Coronary calcium score 0?.  Focal noncalcific plaque (30%) in proximal LAD.  Otherwise no significant CAD.  Normal aortic root (3.2 cm)  ? HAMMER TOE SURGERY Bilateral   ? ingrown  toenail surgery Bilateral   ? 2017  ? IR RADIOLOGY PERIPHERAL GUIDED IV START  07/25/2017  ? IR US GUIDE VASC ACCESS RIGHT  07/25/2017  ? MENISCUS REPAIR Right   ? 2016  ? ORIF WRIST FRACTURE Bilateral   ? PLANTAR FASCIA RELEASE Right   ? POLYPECTOMY    ? SHOULDER ARTHROSCOPY Left   ? TENDON REPAIR Right   ? right quad tendon repair  ? Tilt Table Test    ? Noted to have vasovagal syncope  ? TOTAL HIP ARTHROPLASTY Left 12/08/2020  ? Procedure: LEFT TOTAL HIP ARTHROPLASTY ANTERIOR APPROACH;  Surgeon: Mcarthur Rossetti, MD;  Location: [facility];  Service: Orthopedics;  Laterality: Left;  Needs RNFA  ? TOTAL HIP ARTHROPLASTY Right 03/30/2021  ? Procedure: RIGHT TOTAL HIP ARTHROPLASTY ANTERIOR APPROACH;  Surgeon: Mcarthur Rossetti, MD;  Location: [facility];  Service: Orthopedics;  Laterality: Right;  ? TRANSTHORACIC ECHOCARDIOGRAM  06/21/2015  ? Normal LV function.  EF 57%.  Mild left atrial enlargement, trace AI, trace MR, mild PI.  ? TRANSTHORACIC ECHOCARDIOGRAM  06/2017  ? EF 60-65%.  Mild diastolic dysfunction.  No significant valvular abnormality.  Mild aortic calcification.  ? WISDOM TOOTH EXTRACTION  Bilateral   ? ?Patient Active Problem List  ? Diagnosis Date Noted  ? Status post total replacement of right hip 03/30/2021  ? Status post left hip replacement 12/08/2020  ? Unilateral primary osteoarthritis, left hip 10/16/2020  ? Unilateral primary osteoarthritis, right hip 10/16/2020  ? Hyperlipidemia due to dietary fat intake 10/01/2019  ? Morbid obesity ([place]) 10/01/2019  ? Excessive daytime sleepiness 03/03/2018  ? Obstructive sleep apnea treated with bilevel positive airway pressure (BiPAP) 03/03/2018  ? Sleep paralysis, recurrent isolated 03/03/2018  ? Cataplexy 03/03/2018  ? Abnormal dreams 03/03/2018  ? Breathholding 03/03/2018  ? Hypersomnia, persistent 09/03/2017  ? Exertional dyspnea 06/20/2017  ? Vasovagal syncope 06/20/2017  ? Essential hypertension 06/20/2017  ? History of cardiomyopathy 06/20/2017   ? ? ?PCP: Donnajean Lopes, MD ? ?REFERRING PROVIDER: Mcarthur Rossetti* ? ?REFERRING DIAG: M75.42 (ICD-10-CM) - Impingement syndrome of left shoulder ? ?THERAPY DIAG:  ?Chronic left shoulder pain ? ?Muscle weakness (generalized) ? ?Stiffness of left shoulder, not elsewhere classified ? ?Abnormal posture ? ?Localized edema ? ? ?ONSET DATE: 09/16/2021 ? ?SUBJECTIVE:                                                                                                                                                                                     ? ?SUBJECTIVE STATEMENT: ?Pt indicated he was moving wheelbarrow full on concrete and it fell and he fell on Lt shoulder.  Pt indicated resting for several days and pain continued to hurt over time. Pt indicated by end of Feb he hurt bad enough to see MD.  Had injection and then had MRI.  Ended up with surgery for SAD, distal clavicle resection on November 15, 2021.  Trouble sleeping due to pain at this time.  ? ?PERTINENT HISTORY: ?S/P Lt shoulder arhtroscopy with SAD, distal clavicle resection.  History of DDD lumbar, skin cancer, HTN, hyperlipidemia, GERD.  History of bilateral hip replacements.  ? ?PAIN:  ?Are you having pain? Yes: NPRS scale: 4/10 ?Pain location: Lt shoulder ?Pain description: constant ache ?Aggravating factors: some various reaching, end range movements ?Relieving factors: sleeping in reliner vs bed ? ?PRECAUTIONS: None ? ?WEIGHT BEARING RESTRICTIONS No ? ?FALLS:  ?Has patient fallen in last 6 months? Yes. Number of falls 1 fall ? ?LIVING ENVIRONMENT: ?Lives with: lives with their family ?Lives in: House/apartment ? ?PLOF: Independent, Rt hand dominant but throws Lt handed, yard work, woodworking  ? ?PATIENT GOALS Reduce pain ? ?OBJECTIVE:  ? ?PATIENT SURVEYS:  ?11/29/2021 FOTO intake:47  predicted:  58 ? ?COGNITION: ?11/29/2021 Overall cognitive status: Within functional limits for tasks assessed ?    ?SENSATION: ?11/29/2021 WFL ? ?POSTURE: ?11/29/2021 :  Mild rounded shoulders bilateral ? ?UPPER EXTREMITY ROM:  ? ?ROM Right ?11/29/2021 Left ?11/29/2021 ?Measured in supine  ?Shoulder flexion WFL AROM 140 c ERP  ?Shoulder extension    ?Shoulder abduction WFL AROM 90 deg c ERP  ?Shoulder adduction    ?Shoulder internal rotation WFL AROM 40 deg in 45 deg abd  ?Shoulder external rotation WFL AROM 40 deg in 45 deg abd  ?Elbow flexion  WFL  ?Elbow extension  WFL  ?Wrist flexion    ?Wrist extension    ?Wrist ulnar deviation    ?Wrist radial deviation    ?Wrist pronation    ?Wrist supination    ?(Blank rows = not tested) ? ?UPPER EXTREMITY MMT: ? ?MMT Right ?11/29/2021 Left ?11/29/2021  ?Shoulder flexion 5/5 3+/5 c pain  ?Shoulder extension    ?Shoulder abduction 5/5 3+/5 c pain  ?Shoulder adduction    ?Shoulder internal rotation 5/5 4/5 c pain  ?Shoulder external rotation 5/5 3+/5 c pain  ?Middle trapezius    ?Lower trapezius    ?Elbow flexion 5/5 4/5 c pain  ?Elbow extension 5/5 5/5  ?Wrist flexion    ?Wrist extension    ?Wrist ulnar deviation    ?Wrist radial deviation    ?Wrist pronation    ?Wrist supination    ?Grip strength (lbs)    ?(Blank rows = not tested) ? ?SHOULDER SPECIAL TESTS: ? 11/29/2021:  + Painful Arc Lt, - drop arm Lt ? ?JOINT MOBILITY TESTING:  ?11/29/2021:  No mid range restriction noted in Lt Gh joint passive mobility ? ?PALPATION:  ?11/29/2021: Mild tenderness around Lt shoulder joint, specifically in anterior, posterior aspects.  ?  ?TODAY'S TREATMENT:  ?11/29/2021: ?  Therex: HEP instruction/performance c cues for techniques, handout provided.  Trial set performed of each for comprehension and symptom assessment.  See below for exercise list. ? ?  Manual: G2-g3 inferior jt mobs, Posterior Lt gh joint glide c mobilization c movement into ER Lt shoulder ? ? ?PATIENT EDUCATION: ?11/29/2021 ?Education details: HEP, POC ?Person educated: Patient ?Education method: Explanation, Demonstration, Verbal cues, and Handouts ?Education comprehension: verbalized  understanding and returned demonstration ? ? ?HOME EXERCISE PROGRAM: ?11/29/2021 ?Access Code: [email or web address] ?URL: [email or web address] ?Date: 11/29/2021 ?Prepared by: Scot Jun ? ?Exercises ?- Sup

## 2021-12-03 DIAGNOSIS — Z20828 Contact with and (suspected) exposure to other viral communicable diseases: Secondary | ICD-10-CM | POA: Diagnosis not present

## 2021-12-03 DIAGNOSIS — Z1152 Encounter for screening for COVID-19: Secondary | ICD-10-CM | POA: Diagnosis not present

## 2021-12-05 NOTE — Therapy (Signed)
?OUTPATIENT PHYSICAL THERAPY TREATMENT NOTE ? ? ?Patient Name: Alexander [name] ?MRN: [id number] ?DOB:05-05-1951, 71 y.o., male ?Today's Date: 12/06/2021 ? ?PCP: Donnajean Lopes, MD ?REFERRING PROVIDER: Mcarthur Rossetti* ? ?END OF SESSION:  ? PT End of Session - 12/06/21 8315   ? ? Visit Number 2   ? Number of Visits 20   ? Date for PT Re-Evaluation 02/07/22   ? Authorization Type Medicare/Tricare   ? Progress Note Due on Visit 10   ? PT Start Time [phone]   ? PT Stop Time 1007   ? PT Time Calculation (min) 39 min   ? Activity Tolerance Patient tolerated treatment well   ? Behavior During Therapy [facility] for tasks assessed/performed   ? ?  ?  ? ?  ? ? ?Past Medical History:  ?Diagnosis Date  ? Allergy   ? Cancer [facility])   ? skin cancer to upper chest/basal cell 2007  ? Chronic neck pain   ? DJD  ? Chronic pain in right foot   ? DDD (degenerative disc disease), lumbar   ? Dyslipidemia   ? Erectile dysfunction   ? Exogenous obesity   ? GERD (gastroesophageal reflux disease)   ? H/O acquired cardiomyopathy 1996  ? RESOLVED (apparently was thought to have had an MI, but did not have any intervention).  He developed cardiomyopathy that forced him to retire from [employer]..  The current EF 60-65%.  ? History of fracture due to fall 1988  ? Bilateral wrist fractures  ? Hyperlipidemia   ? Hypertension   ? Hypogonadism male   ? IBS (irritable bowel syndrome)   ? Obstructive sleep apnea   ? cpap since 1999  ? Pre-diabetes   ? on Metformin DAily with supper  ? Sleep apnea   ? CPAP nightly  ? ?Past Surgical History:  ?Procedure Laterality Date  ? CARDIAC CATHETERIZATION  2000  ? Nonobstructive disease.  Persistently reduced EF  ? CARPAL TUNNEL RELEASE Left   ? CARPAL TUNNEL RELEASE Right 01/05/2019  ? Procedure: RIGHT CARPAL TUNNEL RELEASE;  Surgeon: Leanora Cover, MD;  Location: [place];  Service: Orthopedics;  Laterality: Right;  Bier block  ? COLONOSCOPY    ? CORONARY CT ANGIOGRAM  07/2017  ? Coronary  calcium score 0?.  Focal noncalcific plaque (30%) in proximal LAD.  Otherwise no significant CAD.  Normal aortic root (3.2 cm)  ? HAMMER TOE SURGERY Bilateral   ? ingrown toenail surgery Bilateral   ? 2017  ? IR RADIOLOGY PERIPHERAL GUIDED IV START  07/25/2017  ? IR US GUIDE VASC ACCESS RIGHT  07/25/2017  ? MENISCUS REPAIR Right   ? 2016  ? ORIF WRIST FRACTURE Bilateral   ? PLANTAR FASCIA RELEASE Right   ? POLYPECTOMY    ? SHOULDER ARTHROSCOPY Left   ? TENDON REPAIR Right   ? right quad tendon repair  ? Tilt Table Test    ? Noted to have vasovagal syncope  ? TOTAL HIP ARTHROPLASTY Left 12/08/2020  ? Procedure: LEFT TOTAL HIP ARTHROPLASTY ANTERIOR APPROACH;  Surgeon: Mcarthur Rossetti, MD;  Location: [facility];  Service: Orthopedics;  Laterality: Left;  Needs RNFA  ? TOTAL HIP ARTHROPLASTY Right 03/30/2021  ? Procedure: RIGHT TOTAL HIP ARTHROPLASTY ANTERIOR APPROACH;  Surgeon: Mcarthur Rossetti, MD;  Location: [facility];  Service: Orthopedics;  Laterality: Right;  ? TRANSTHORACIC ECHOCARDIOGRAM  06/21/2015  ? Normal LV function.  EF 57%.  Mild left atrial enlargement, trace AI, trace  MR, mild PI.  ? TRANSTHORACIC ECHOCARDIOGRAM  06/2017  ? EF 60-65%.  Mild diastolic dysfunction.  No significant valvular abnormality.  Mild aortic calcification.  ? WISDOM TOOTH EXTRACTION Bilateral   ? ?Patient Active Problem List  ? Diagnosis Date Noted  ? Status post total replacement of right hip 03/30/2021  ? Status post left hip replacement 12/08/2020  ? Unilateral primary osteoarthritis, left hip 10/16/2020  ? Unilateral primary osteoarthritis, right hip 10/16/2020  ? Hyperlipidemia due to dietary fat intake 10/01/2019  ? Morbid obesity ([place]) 10/01/2019  ? Excessive daytime sleepiness 03/03/2018  ? Obstructive sleep apnea treated with bilevel positive airway pressure (BiPAP) 03/03/2018  ? Sleep paralysis, recurrent isolated 03/03/2018  ? Cataplexy 03/03/2018  ? Abnormal dreams 03/03/2018  ? Breathholding 03/03/2018  ?  Hypersomnia, persistent 09/03/2017  ? Exertional dyspnea 06/20/2017  ? Vasovagal syncope 06/20/2017  ? Essential hypertension 06/20/2017  ? History of cardiomyopathy 06/20/2017  ? ? ?REFERRING PROVIDER: Mcarthur Rossetti* ?  ?REFERRING DIAG: M75.42 (ICD-10-CM) - Impingement syndrome of left shoulder ? ?ONSET DATE: 09/16/2021 ? ?THERAPY DIAG:  ?Chronic left shoulder pain ? ?Muscle weakness (generalized) ? ?Stiffness of left shoulder, not elsewhere classified ? ?Abnormal posture ? ?Localized edema ? ?PERTINENT HISTORY: S/P Lt shoulder arhtroscopy with SAD, distal clavicle resection.  History of DDD lumbar, skin cancer, HTN, hyperlipidemia, GERD.  History of bilateral hip replacements.  ? ?PRECAUTIONS: None ? ?SUBJECTIVE: Pt indicated feeling better with movements.  Pt stated 4/10 pain upon arrival ? ?PAIN:  ?Are you having pain? Yes:  ?NPRS scale: 4/10 ?Pain location: Lt shoulder ?Pain description: constant ache ?Aggravating factors: some various reaching, end range movements ?Relieving factors: improved c movement ?  ?  ?OBJECTIVE:  ?  ?PATIENT SURVEYS:  ?11/29/2021 FOTO intake:47  predicted:  52 ?  ?COGNITION: ?11/29/2021 Overall cognitive status: Within functional limits for tasks assessed ?                                    ?SENSATION: ?11/29/2021 WFL ?  ?POSTURE: ?11/29/2021 : Mild rounded shoulders bilateral ?  ?UPPER EXTREMITY ROM:  ?  ?ROM Right ?11/29/2021 Left ?11/29/2021 ?Measured in supine Left ?12/06/2021 ?Measured in supine  ?Shoulder flexion WFL AROM 140 c ERP AROM 165  ?Shoulder extension       ?Shoulder abduction WFL AROM 90 deg c ERP AROM 145  ?Shoulder adduction       ?Shoulder internal rotation WFL AROM 40 deg in 45 deg abd AROM 70 deg in 45 deg abd  ?Shoulder external rotation WFL AROM 40 deg in 45 deg abd AROM 65 deg in 45 deg abd  ?Elbow flexion   WFL   ?Elbow extension   WFL   ?Wrist flexion       ?Wrist extension       ?Wrist ulnar deviation       ?Wrist radial deviation       ?Wrist pronation        ?Wrist supination       ?(Blank rows = not tested) ?  ?UPPER EXTREMITY MMT: ?  ?MMT Right ?11/29/2021 Left ?11/29/2021  ?Shoulder flexion 5/5 3+/5 c pain  ?Shoulder extension      ?Shoulder abduction 5/5 3+/5 c pain  ?Shoulder adduction      ?Shoulder internal rotation 5/5 4/5 c pain  ?Shoulder external rotation 5/5 3+/5 c pain  ?Middle trapezius      ?Lower  trapezius      ?Elbow flexion 5/5 4/5 c pain  ?Elbow extension 5/5 5/5  ?Wrist flexion      ?Wrist extension      ?Wrist ulnar deviation      ?Wrist radial deviation      ?Wrist pronation      ?Wrist supination      ?Grip strength (lbs)      ?(Blank rows = not tested) ?  ?SHOULDER SPECIAL TESTS: ?            11/29/2021:  + Painful Arc Lt, - drop arm Lt ?  ?JOINT MOBILITY TESTING:  ?11/29/2021:  No mid range restriction noted in Lt Gh joint passive mobility ?  ?PALPATION:  ?11/29/2021: Mild tenderness around Lt shoulder joint, specifically in anterior, posterior aspects.  ?             ?TODAY'S TREATMENT:  ?12/06/2021: ?            Therex:           Sidelying Lt shoulder abduction 1 lb x20, 2 lb x 15 ?   Sidelying Lt shoulder flexion x5 (stopped due to pain complaint) ?   Supine Lt shoulder flexion 2 x 15  ?    Supine horizontal abduction green band 2 x 15 bilateral ? ?    ?  ?            Manual:           G2-g3 inferior jt mobs, Posterior Lt gh joint glide c mobilization c movement into ER Lt shoulder ? ?11/29/2021: ?            Therex:            HEP instruction/performance c cues for techniques, handout provided.  Trial set performed of each for comprehension and symptom assessment.  See below for exercise list. ?  ?            Manual:           G2-g3 inferior jt mobs, Posterior Lt gh joint glide c mobilization c movement into ER Lt shoulder ?  ?  ?PATIENT EDUCATION: ?12/06/2021 ?Education details: HEP progression ?Person educated: Patient ?Education method: Explanation, Demonstration, Verbal cues, and Handouts ?Education comprehension: verbalized understanding  and returned demonstration ?  ?  ?HOME EXERCISE PROGRAM: ?12/06/2021 ?Access Code: [email or web address] ?URL: [email or web address] ?Date: 12/06/2021 ?Prepared by: Scot Jun ? ?Exercises ?- Supine Shou

## 2021-12-06 ENCOUNTER — Encounter: Payer: Self-pay | Admitting: Rehabilitative and Restorative Service Providers"

## 2021-12-06 ENCOUNTER — Other Ambulatory Visit: Payer: Self-pay

## 2021-12-06 ENCOUNTER — Ambulatory Visit (INDEPENDENT_AMBULATORY_CARE_PROVIDER_SITE_OTHER): Payer: Medicare Other | Admitting: Rehabilitative and Restorative Service Providers"

## 2021-12-06 DIAGNOSIS — M6281 Muscle weakness (generalized): Secondary | ICD-10-CM

## 2021-12-06 DIAGNOSIS — G8929 Other chronic pain: Secondary | ICD-10-CM | POA: Diagnosis not present

## 2021-12-06 DIAGNOSIS — R293 Abnormal posture: Secondary | ICD-10-CM

## 2021-12-06 DIAGNOSIS — M25512 Pain in left shoulder: Secondary | ICD-10-CM

## 2021-12-06 DIAGNOSIS — R600 Localized edema: Secondary | ICD-10-CM | POA: Diagnosis not present

## 2021-12-06 DIAGNOSIS — M25612 Stiffness of left shoulder, not elsewhere classified: Secondary | ICD-10-CM

## 2021-12-13 ENCOUNTER — Ambulatory Visit (INDEPENDENT_AMBULATORY_CARE_PROVIDER_SITE_OTHER): Payer: Medicare Other | Admitting: Rehabilitative and Restorative Service Providers"

## 2021-12-13 ENCOUNTER — Encounter: Payer: Self-pay | Admitting: Rehabilitative and Restorative Service Providers"

## 2021-12-13 DIAGNOSIS — G8929 Other chronic pain: Secondary | ICD-10-CM

## 2021-12-13 DIAGNOSIS — M25512 Pain in left shoulder: Secondary | ICD-10-CM

## 2021-12-13 DIAGNOSIS — M25612 Stiffness of left shoulder, not elsewhere classified: Secondary | ICD-10-CM | POA: Diagnosis not present

## 2021-12-13 DIAGNOSIS — M6281 Muscle weakness (generalized): Secondary | ICD-10-CM | POA: Diagnosis not present

## 2021-12-13 NOTE — Therapy (Signed)
?OUTPATIENT PHYSICAL THERAPY TREATMENT NOTE ? ? ?Patient Name: Alexander [name] ?MRN: [id number] ?DOB:[birth year], 71 y.o., male ?Today's Date: 12/13/2021 ? ?PCP: Alexander Lopes, MD ?REFERRING PROVIDER: Mcarthur [name]* ? ?END OF SESSION:  ? PT End of Session - 12/13/21 0815   ? ? Visit Number 3   ? Number of Visits 20   ? Date for PT Re-Evaluation 02/07/22   ? Authorization Type Medicare/Tricare   ? Progress Note Due on Visit 10   ? PT Start Time 0802   ? PT Stop Time 0845   ? PT Time Calculation (min) 43 min   ? Activity Tolerance Patient tolerated treatment well;No increased pain   ? Behavior During Therapy [facility] for tasks assessed/performed   ? ?  ?  ? ?  ? ? ? ?Past Medical History:  ?Diagnosis Date  ? Allergy   ? Cancer [facility])   ? skin cancer to upper chest/basal cell 2007  ? Chronic neck pain   ? DJD  ? Chronic pain in right foot   ? DDD (degenerative disc disease), lumbar   ? Dyslipidemia   ? Erectile dysfunction   ? Exogenous obesity   ? GERD (gastroesophageal reflux disease)   ? H/O acquired cardiomyopathy 1996  ? RESOLVED (apparently was thought to have had an MI, but did not have any intervention).  He developed cardiomyopathy that forced him to retire from [employer]..  The current EF 60-65%.  ? History of fracture due to fall 1988  ? Bilateral wrist fractures  ? Hyperlipidemia   ? Hypertension   ? Hypogonadism male   ? IBS (irritable bowel syndrome)   ? Obstructive sleep apnea   ? cpap since 1999  ? Pre-diabetes   ? on Metformin DAily with supper  ? Sleep apnea   ? CPAP nightly  ? ?Past Surgical History:  ?Procedure Laterality Date  ? CARDIAC CATHETERIZATION  2000  ? Nonobstructive disease.  Persistently reduced EF  ? CARPAL TUNNEL RELEASE Left   ? CARPAL TUNNEL RELEASE Right 01/05/2019  ? Procedure: RIGHT CARPAL TUNNEL RELEASE;  Surgeon: Leanora Cover, MD;  Location: [place];  Service: Orthopedics;  Laterality: Right;  Bier block  ? COLONOSCOPY    ? CORONARY CT ANGIOGRAM   07/2017  ? Coronary calcium score 0?.  Focal noncalcific plaque (30%) in proximal LAD.  Otherwise no significant CAD.  Normal aortic root (3.2 cm)  ? HAMMER TOE SURGERY Bilateral   ? ingrown toenail surgery Bilateral   ? 2017  ? IR RADIOLOGY PERIPHERAL GUIDED IV START  07/25/2017  ? IR US GUIDE VASC ACCESS RIGHT  07/25/2017  ? MENISCUS REPAIR Right   ? 2016  ? ORIF WRIST FRACTURE Bilateral   ? PLANTAR FASCIA RELEASE Right   ? POLYPECTOMY    ? SHOULDER ARTHROSCOPY Left   ? TENDON REPAIR Right   ? right quad tendon repair  ? Tilt Table Test    ? Noted to have vasovagal syncope  ? TOTAL HIP ARTHROPLASTY Left 12/08/2020  ? Procedure: LEFT TOTAL HIP ARTHROPLASTY ANTERIOR APPROACH;  Surgeon: Alexander Rossetti, MD;  Location[facility];  Service: Orthopedics;  Laterality: Left;  Needs RNFA  ? TOTAL HIP ARTHROPLASTY Right 03/30/2021  ? Procedure: RIGHT TOTAL HIP ARTHROPLASTY ANTERIOR APPROACH;  Surgeon: Alexander Rossetti, MD;  Location[facility];  Service: Orthopedics;  Laterality: Right;  ? TRANSTHORACIC ECHOCARDIOGRAM  06/21/2015  ? Normal LV function.  EF 57%.  Mild left atrial enlargement,  trace AI, trace MR, mild PI.  ? TRANSTHORACIC ECHOCARDIOGRAM  06/2017  ? EF 60-65%.  Mild diastolic dysfunction.  No significant valvular abnormality.  Mild aortic calcification.  ? WISDOM TOOTH EXTRACTION Bilateral   ? ?Patient Active Problem List  ? Diagnosis Date Noted  ? Status post total replacement of right hip 03/30/2021  ? Status post left hip replacement 12/08/2020  ? Unilateral primary osteoarthritis, left hip 10/16/2020  ? Unilateral primary osteoarthritis, right hip 10/16/2020  ? Hyperlipidemia due to dietary fat intake 10/01/2019  ? Morbid obesity ([place]) 10/01/2019  ? Excessive daytime sleepiness 03/03/2018  ? Obstructive sleep apnea treated with bilevel positive airway pressure (BiPAP) 03/03/2018  ? Sleep paralysis, recurrent isolated 03/03/2018  ? Cataplexy 03/03/2018  ? Abnormal dreams 03/03/2018  ? Breathholding  03/03/2018  ? Hypersomnia, persistent 09/03/2017  ? Exertional dyspnea 06/20/2017  ? Vasovagal syncope 06/20/2017  ? Essential hypertension 06/20/2017  ? History of cardiomyopathy 06/20/2017  ? ? ?REFERRING PROVIDER: Mcarthur [name]* ?  ?REFERRING DIAG: M75.42 (ICD-10-CM) - Impingement syndrome of left shoulder ? ?ONSET DATE: 09/16/2021 ? ?THERAPY DIAG:  ?Stiffness of left shoulder, not elsewhere classified ? ?Chronic left shoulder pain ? ?Muscle weakness (generalized) ? ?PERTINENT HISTORY: S/P Lt shoulder arhtroscopy with SAD, distal clavicle resection.  History of DDD lumbar, skin cancer, HTN, hyperlipidemia, GERD.  History of bilateral hip replacements.  ? ?PRECAUTIONS: None ? ?SUBJECTIVE: Alexander [name] notes "daily" progress with his L shoulder.  He is encouraged by early results. ? ?PAIN:  ?Are you having pain? Yes ?NPRS scale: 2-4/10 this week ?Pain location: Lt shoulder ?Pain description: constant ache, more sore with activity around the house ?Aggravating factors: some various reaching, end range movements, overuse ?Relieving factors: improved c movement and rest PRN ?  ?  ?OBJECTIVE:  ?  ?PATIENT SURVEYS:  ?11/29/2021 FOTO intake:47  predicted:  53 ?  ?COGNITION: ?11/29/2021 Overall cognitive status: Within functional limits for tasks assessed ?                                    ?SENSATION: ?11/29/2021 WFL ?  ?POSTURE: ?11/29/2021 : Mild rounded shoulders bilateral ?  ?UPPER EXTREMITY ROM:  ?  ?ROM Right ?11/29/2021 Left ?11/29/2021 ?Measured in supine Left ?12/06/2021 ?Measured in supine Left Supine 12/13/2021  ?Shoulder flexion WFL AROM 140 c ERP AROM 165 165  ?Shoulder extension        ?Shoulder abduction WFL AROM 90 deg c ERP AROM 145   ?Shoulder horizontal adduction      35  ?Shoulder internal rotation WFL AROM 40 deg in 45 deg abd AROM 70 deg in 45 deg abd 60 at 80 degrees abduction  ?Shoulder external rotation WFL AROM 40 deg in 45 deg abd AROM 65 deg in 45 deg abd 90 at 80 degrees abduction  ?Elbow  flexion   WFL    ?Elbow extension   WFL    ?Wrist flexion        ?Wrist extension        ?Wrist ulnar deviation        ?Wrist radial deviation        ?Wrist pronation        ?Wrist supination        ?(Blank rows = not tested) ?  ?UPPER EXTREMITY MMT: ?  ?MMT Right ?11/29/2021 Left ?11/29/2021  ?Shoulder flexion 5/5 3+/5 c pain  ?Shoulder extension      ?Shoulder  abduction 5/5 3+/5 c pain  ?Shoulder adduction      ?Shoulder internal rotation 5/5 4/5 c pain  ?Shoulder external rotation 5/5 3+/5 c pain  ?Middle trapezius      ?Lower trapezius      ?Elbow flexion 5/5 4/5 c pain  ?Elbow extension 5/5 5/5  ?Wrist flexion      ?Wrist extension      ?Wrist ulnar deviation      ?Wrist radial deviation      ?Wrist pronation      ?Wrist supination      ?Grip strength (lbs)      ?(Blank rows = not tested) ?  ?SHOULDER SPECIAL TESTS: ?            11/29/2021:  + Painful Arc Lt, - drop arm Lt ?  ?JOINT MOBILITY TESTING:  ?11/29/2021:  No mid range restriction noted in Lt Gh joint passive mobility ?  ?PALPATION:  ?11/29/2021: Mild tenderness around Lt shoulder joint, specifically in anterior, posterior aspects.  ?             ?TODAY'S TREATMENT:  ?12/13/2021: ? Therex:            ?Side lying Lt shoulder abduction 1# x 20 ?   ?Supine Lt shoulder flexion 2 x 15 2# ?     ?Standing shoulder ER green band 2 x 15 bilateral with slow eccentrics ? ?Pull to chest (scapular retraction) Blue thera-band 2 sets of 20 slow eccentrics limited range ? ?Supine arm raises 20X 3 seconds with 6# (elbow stays straight, reach up straight to the ceiling) ? ?Neuromuscular re-education: Body blade 3 positions 20 seconds each (up/down; forward/back; IR/ER) ? ?Functional Activities: education on avoiding anything other than light lifting away from the body and with activities overhead.  Discussed appropriate activities to return to with the gym with emphasis on light resistance and no pain or "clicking" ? ? ?12/06/2021: ?            Therex:           Sidelying Lt  shoulder abduction 1 lb x20, 2 lb x 15 ?   Sidelying Lt shoulder flexion x5 (stopped due to pain complaint) ?   Supine Lt shoulder flexion 2 x 15  ?    Supine horizontal abduction green band 2 x 15 bilateral

## 2021-12-14 DIAGNOSIS — Z20822 Contact with and (suspected) exposure to covid-19: Secondary | ICD-10-CM | POA: Diagnosis not present

## 2021-12-19 DIAGNOSIS — Z20822 Contact with and (suspected) exposure to covid-19: Secondary | ICD-10-CM | POA: Diagnosis not present

## 2021-12-20 ENCOUNTER — Encounter: Payer: Self-pay | Admitting: Orthopaedic Surgery

## 2021-12-20 ENCOUNTER — Ambulatory Visit (INDEPENDENT_AMBULATORY_CARE_PROVIDER_SITE_OTHER): Payer: Medicare Other | Admitting: Rehabilitative and Restorative Service Providers"

## 2021-12-20 ENCOUNTER — Encounter: Payer: Self-pay | Admitting: Rehabilitative and Restorative Service Providers"

## 2021-12-20 ENCOUNTER — Other Ambulatory Visit: Payer: Self-pay | Admitting: Neurology

## 2021-12-20 ENCOUNTER — Ambulatory Visit (INDEPENDENT_AMBULATORY_CARE_PROVIDER_SITE_OTHER): Payer: Medicare Other | Admitting: Orthopaedic Surgery

## 2021-12-20 DIAGNOSIS — G8929 Other chronic pain: Secondary | ICD-10-CM

## 2021-12-20 DIAGNOSIS — Z20822 Contact with and (suspected) exposure to covid-19: Secondary | ICD-10-CM | POA: Diagnosis not present

## 2021-12-20 DIAGNOSIS — R600 Localized edema: Secondary | ICD-10-CM | POA: Diagnosis not present

## 2021-12-20 DIAGNOSIS — M25612 Stiffness of left shoulder, not elsewhere classified: Secondary | ICD-10-CM

## 2021-12-20 DIAGNOSIS — M6281 Muscle weakness (generalized): Secondary | ICD-10-CM

## 2021-12-20 DIAGNOSIS — M25512 Pain in left shoulder: Secondary | ICD-10-CM | POA: Diagnosis not present

## 2021-12-20 DIAGNOSIS — Z9889 Other specified postprocedural states: Secondary | ICD-10-CM

## 2021-12-20 MED ORDER — METHOCARBAMOL 500 MG PO TABS: 500.0000 mg | ORAL_TABLET | Freq: Four times a day (QID) | ORAL | 1 refills | Status: DC | PRN

## 2021-12-20 NOTE — Progress Notes (Signed)
The patient is now about 6 weeks out from a repeat arthroscopy of his left shoulder.  He is doing much better overall and credit goes to the physical therapist upstairs who are working with him.  He is very pleased with his progress at this point and he feels that a lot of this points to the therapist helping out quite a bit and I agree with this as well based on what I am seeing from the therapist notes.  He looks much better overall and his pain is much less.  He is requesting methocarbamol.  He says his pain can be a 3 out of 10 but he is tolerating things and he does report better motion and strength with his left shoulder. ? ?His left shoulder is moving much better than he did preoperatively.  His pain seems to be less and he has decent strength. ? ?I will have him continue physical therapy since they are doing such a good job with him.  I will send in some Robaxin.  We will see him for hopefully a final visit in 4 weeks.  All questions and concerns were answered and addressed. ?

## 2021-12-20 NOTE — Therapy (Signed)
?OUTPATIENT PHYSICAL THERAPY TREATMENT NOTE ? ? ?Patient Name: Alexander [name] ?MRN: [id number] ?DOB:28-Jan-1951, 71 y.o., male ?Today's Date: 12/20/2021 ? ?PCP: Alexander Lopes, MD ?REFERRING PROVIDER: Mcarthur [name]* ? ?Progress Note ?Reporting Period 11/29/2021 to 12/20/2021 ? ?See note below for Objective Data and Assessment of Progress/Goals.  ? ?  ?END OF SESSION:  ? PT End of Session - 12/20/21 1432   ? ? Visit Number 4   ? Number of Visits 20   ? Date for PT Re-Evaluation 02/07/22   ? Authorization Type Medicare/Tricare   ? Progress Note Due on Visit 10   ? PT Start Time 1345   ? PT Stop Time 1430   ? PT Time Calculation (min) 45 min   ? Activity Tolerance Patient tolerated treatment well;No increased pain   ? Behavior During Therapy [facility] for tasks assessed/performed   ? ?  ?  ? ?  ? ? ? ? ?Past Medical History:  ?Diagnosis Date  ? Allergy   ? Cancer [facility])   ? skin cancer to upper chest/basal cell 2007  ? Chronic neck pain   ? DJD  ? Chronic pain in right foot   ? DDD (degenerative disc disease), lumbar   ? Dyslipidemia   ? Erectile dysfunction   ? Exogenous obesity   ? GERD (gastroesophageal reflux disease)   ? H/O acquired cardiomyopathy 1996  ? RESOLVED (apparently was thought to have had an MI, but did not have any intervention).  He developed cardiomyopathy that forced him to retire from [employer]..  The current EF 60-65%.  ? History of fracture due to fall 1988  ? Bilateral wrist fractures  ? Hyperlipidemia   ? Hypertension   ? Hypogonadism male   ? IBS (irritable bowel syndrome)   ? Obstructive sleep apnea   ? cpap since 1999  ? Pre-diabetes   ? on Metformin DAily with supper  ? Sleep apnea   ? CPAP nightly  ? ?Past Surgical History:  ?Procedure Laterality Date  ? CARDIAC CATHETERIZATION  2000  ? Nonobstructive disease.  Persistently reduced EF  ? CARPAL TUNNEL RELEASE Left   ? CARPAL TUNNEL RELEASE Right 01/05/2019  ? Procedure: RIGHT CARPAL TUNNEL RELEASE;  Surgeon: Alexander Cover, MD;   Location: [facility];  Service: Orthopedics;  Laterality: Right;  Bier block  ? COLONOSCOPY    ? CORONARY CT ANGIOGRAM  07/2017  ? Coronary calcium score 0?.  Focal noncalcific plaque (30%) in proximal LAD.  Otherwise no significant CAD.  Normal aortic root (3.2 cm)  ? HAMMER TOE SURGERY Bilateral   ? ingrown toenail surgery Bilateral   ? 2017  ? IR RADIOLOGY PERIPHERAL GUIDED IV START  07/25/2017  ? IR US GUIDE VASC ACCESS RIGHT  07/25/2017  ? MENISCUS REPAIR Right   ? 2016  ? ORIF WRIST FRACTURE Bilateral   ? PLANTAR FASCIA RELEASE Right   ? POLYPECTOMY    ? SHOULDER ARTHROSCOPY Left   ? TENDON REPAIR Right   ? right quad tendon repair  ? Tilt Table Test    ? Noted to have vasovagal syncope  ? TOTAL HIP ARTHROPLASTY Left 12/08/2020  ? Procedure: LEFT TOTAL HIP ARTHROPLASTY ANTERIOR APPROACH;  Surgeon: Alexander Rossetti, MD;  Location: [facility];  Service: Orthopedics;  Laterality: Left;  Needs RNFA  ? TOTAL HIP ARTHROPLASTY Right 03/30/2021  ? Procedure: RIGHT TOTAL HIP ARTHROPLASTY ANTERIOR APPROACH;  Surgeon: Alexander Rossetti, MD;  Location: [facility];  Service:  Orthopedics;  Laterality: Right;  ? TRANSTHORACIC ECHOCARDIOGRAM  06/21/2015  ? Normal LV function.  EF 57%.  Mild left atrial enlargement, trace AI, trace MR, mild PI.  ? TRANSTHORACIC ECHOCARDIOGRAM  06/2017  ? EF 60-65%.  Mild diastolic dysfunction.  No significant valvular abnormality.  Mild aortic calcification.  ? WISDOM TOOTH EXTRACTION Bilateral   ? ?Patient Active Problem List  ? Diagnosis Date Noted  ? Status post total replacement of right hip 03/30/2021  ? Status post left hip replacement 12/08/2020  ? Unilateral primary osteoarthritis, left hip 10/16/2020  ? Unilateral primary osteoarthritis, right hip 10/16/2020  ? Hyperlipidemia due to dietary fat intake 10/01/2019  ? Morbid obesity ([place]) 10/01/2019  ? Excessive daytime sleepiness 03/03/2018  ? Obstructive sleep apnea treated with bilevel positive airway pressure  (BiPAP) 03/03/2018  ? Sleep paralysis, recurrent isolated 03/03/2018  ? Cataplexy 03/03/2018  ? Abnormal dreams 03/03/2018  ? Breathholding 03/03/2018  ? Hypersomnia, persistent 09/03/2017  ? Exertional dyspnea 06/20/2017  ? Vasovagal syncope 06/20/2017  ? Essential hypertension 06/20/2017  ? History of cardiomyopathy 06/20/2017  ? ? ?REFERRING PROVIDER: Mcarthur [name]* ?  ?REFERRING DIAG: M75.42 (ICD-10-CM) - Impingement syndrome of left shoulder ? ?ONSET DATE: 09/16/2021 ? ?THERAPY DIAG:  ?Stiffness of left shoulder, not elsewhere classified ? ?Muscle weakness (generalized) ? ?Chronic left shoulder pain ? ?Localized edema ? ?PERTINENT HISTORY: S/P Lt shoulder arhtroscopy with SAD, distal clavicle resection.  History of DDD lumbar, skin cancer, HTN, hyperlipidemia, GERD.  History of bilateral hip replacements.  ? ?PRECAUTIONS: None ? ?SUBJECTIVE: Alexander [name] saw Dr. Ninfa [name] today.  Good report from Dr. Ninfa [name] who wants to see Alexander [name] in a month.  Pain is improving although strength is still limiting function. ? ?PAIN:  ?Are you having pain? Yes ?NPRS scale: 1-3/10 this week ?Pain location: Lt shoulder ?Pain description: constant ache, more sore with activity around the house ?Aggravating factors: some various reaching, end range movements, overuse ?Relieving factors: improved c movement and rest PRN ?  ?  ?OBJECTIVE:  ?  ?PATIENT SURVEYS:  ?12/20/2021 FOTO 57 (was 47, Goal 63); ?11/29/2021 FOTO intake: 47  predicted:  63 ?  ?COGNITION: ?11/29/2021 Overall cognitive status: Within functional limits for tasks assessed ?                                    ?SENSATION: ?11/29/2021 WFL ?  ?POSTURE: ?11/29/2021 : Mild rounded shoulders bilateral ?  ?UPPER EXTREMITY ROM:  ?  ?ROM Right ?11/29/2021 Left ?11/29/2021 ?Measured in supine Left ?12/06/2021 ?Measured in supine Left Supine 12/13/2021  ?Shoulder flexion WFL AROM 140 c ERP AROM 165 165  ?Shoulder extension        ?Shoulder abduction WFL AROM 90 deg c ERP AROM 145    ?Shoulder horizontal adduction      35  ?Shoulder internal rotation WFL AROM 40 deg in 45 deg abd AROM 70 deg in 45 deg abd 60 at 80 degrees abduction  ?Shoulder external rotation WFL AROM 40 deg in 45 deg abd AROM 65 deg in 45 deg abd 90 at 80 degrees abduction  ?Elbow flexion   WFL    ?Elbow extension   WFL    ?Wrist flexion        ?Wrist extension        ?Wrist ulnar deviation        ?Wrist radial deviation        ?Wrist pronation        ?  Wrist supination        ?(Blank rows = not tested) ?  ?UPPER EXTREMITY MMT: ?  ?MMT Right ?11/29/2021 Left ?11/29/2021 L/R in pounds with hand held dynamometer  ?Shoulder flexion 5/5 3+/5 c pain   ?Shoulder extension       ?Shoulder abduction 5/5 3+/5 c pain   ?Shoulder adduction       ?Shoulder internal rotation 5/5 4/5 c pain 39.9/35.0  ?Shoulder external rotation 5/5 3+/5 c pain 18.6/28.2  ?Middle trapezius       ?Lower trapezius       ?Elbow flexion 5/5 4/5 c pain   ?Elbow extension 5/5 5/5   ?Wrist flexion       ?Wrist extension       ?Wrist ulnar deviation       ?Wrist radial deviation       ?Wrist pronation       ?Wrist supination       ?Grip strength (lbs)       ?(Blank rows = not tested) ?  ?SHOULDER SPECIAL TESTS: ?            11/29/2021:  + Painful Arc Lt, - drop arm Lt ?  ?JOINT MOBILITY TESTING:  ?11/29/2021:  No mid range restriction noted in Lt Gh joint passive mobility ?  ?PALPATION:  ?11/29/2021: Mild tenderness around Lt shoulder joint, specifically in anterior, posterior aspects.  ?             ?TODAY'S TREATMENT:  ?12/20/2021: ?Therex:            ? ?Standing shoulder ER green band 2 x 15 bilateral with slow eccentrics ? ?Pull to chest (scapular retraction) Blue thera-band 2 sets of 20 slow eccentrics limited range ? ?Supine arm raises 20X 3 seconds with 6# (elbow stays straight, reach up straight to the ceiling) ? ?Prone 90 & 100 degrees with full shoulder ER (lead with thumb) 5X 3 seconds each ? ?Shoulder blade pinches 10X 5 seconds ? ?UBE Push/Pull/Rest :20  intervals for 8 minutes, Level 4 ? ? ?12/13/2021: ? Therex:            ?Side lying Lt shoulder abduction 1# x 20 ?   ?Supine Lt shoulder flexion 2 x 15 2# ?     ?Standing shoulder ER green band 2 x 15 bilateral with sl

## 2021-12-24 ENCOUNTER — Other Ambulatory Visit: Payer: Self-pay | Admitting: Neurology

## 2021-12-25 DIAGNOSIS — Z20828 Contact with and (suspected) exposure to other viral communicable diseases: Secondary | ICD-10-CM | POA: Diagnosis not present

## 2021-12-25 MED ORDER — MODAFINIL 200 MG PO TABS: 200.0000 mg | ORAL_TABLET | Freq: Every day | ORAL | 0 refills | Status: DC

## 2021-12-25 NOTE — Telephone Encounter (Signed)
Last OV was on 07/17/21.  ?Next OV is scheduled for 07/16/22.  ?Last RX was written on 11/30/21 for 30 tabs.  ? ?[place] [facility] Database has been reviewed. ? ?Changed filled date to [birth year] for compliance. Ok to fill ?

## 2021-12-26 DIAGNOSIS — Z20822 Contact with and (suspected) exposure to covid-19: Secondary | ICD-10-CM | POA: Diagnosis not present

## 2021-12-27 ENCOUNTER — Encounter: Payer: Medicare Other | Admitting: Rehabilitative and Restorative Service Providers"

## 2021-12-27 DIAGNOSIS — Z20822 Contact with and (suspected) exposure to covid-19: Secondary | ICD-10-CM | POA: Diagnosis not present

## 2021-12-31 ENCOUNTER — Encounter: Payer: Self-pay | Admitting: Rehabilitative and Restorative Service Providers"

## 2021-12-31 ENCOUNTER — Ambulatory Visit (INDEPENDENT_AMBULATORY_CARE_PROVIDER_SITE_OTHER): Payer: Medicare Other | Admitting: Rehabilitative and Restorative Service Providers"

## 2021-12-31 DIAGNOSIS — R293 Abnormal posture: Secondary | ICD-10-CM

## 2021-12-31 DIAGNOSIS — M25612 Stiffness of left shoulder, not elsewhere classified: Secondary | ICD-10-CM | POA: Diagnosis not present

## 2021-12-31 DIAGNOSIS — M25512 Pain in left shoulder: Secondary | ICD-10-CM

## 2021-12-31 DIAGNOSIS — M6281 Muscle weakness (generalized): Secondary | ICD-10-CM

## 2021-12-31 DIAGNOSIS — G8929 Other chronic pain: Secondary | ICD-10-CM

## 2021-12-31 DIAGNOSIS — R600 Localized edema: Secondary | ICD-10-CM | POA: Diagnosis not present

## 2021-12-31 NOTE — Therapy (Signed)
?OUTPATIENT PHYSICAL THERAPY TREATMENT NOTE ? ? ?Patient Name: Alexander [name] ?MRN: [id number] ?DOB:[birth year], 71 y.o., male ?Today's Date: 12/31/2021 ? ?PCP: Donnajean Lopes, MD ?REFERRING PROVIDER: Mcarthur Rossetti* ? ?Progress Note ?Reporting Period 11/29/2021 to 12/20/2021 ? ?See note below for Objective Data and Assessment of Progress/Goals.  ? ?  ?END OF SESSION:  ? PT End of Session - 12/31/21 9767   ? ? Visit Number 5   ? Number of Visits 20   ? Date for PT Re-Evaluation 02/07/22   ? Authorization Type Medicare/Tricare   ? Progress Note Due on Visit 10   ? PT Start Time [phone]   ? PT Stop Time 3790   ? PT Time Calculation (min) 46 min   ? Activity Tolerance Patient tolerated treatment well;No increased pain   ? Behavior During Therapy [facility] for tasks assessed/performed   ? ?  ?  ? ?  ? ? ? ? ? ?Past Medical History:  ?Diagnosis Date  ? Allergy   ? Cancer [facility])   ? skin cancer to upper chest/basal cell 2007  ? Chronic neck pain   ? DJD  ? Chronic pain in right foot   ? DDD (degenerative disc disease), lumbar   ? Dyslipidemia   ? Erectile dysfunction   ? Exogenous obesity   ? GERD (gastroesophageal reflux disease)   ? H/O acquired cardiomyopathy 1996  ? RESOLVED (apparently was thought to have had an MI, but did not have any intervention).  He developed cardiomyopathy that forced him to retire from [employer]..  The current EF 60-65%.  ? History of fracture due to fall 1988  ? Bilateral wrist fractures  ? Hyperlipidemia   ? Hypertension   ? Hypogonadism male   ? IBS (irritable bowel syndrome)   ? Obstructive sleep apnea   ? cpap since 1999  ? Pre-diabetes   ? on Metformin DAily with supper  ? Sleep apnea   ? CPAP nightly  ? ?Past Surgical History:  ?Procedure Laterality Date  ? CARDIAC CATHETERIZATION  2000  ? Nonobstructive disease.  Persistently reduced EF  ? CARPAL TUNNEL RELEASE Left   ? CARPAL TUNNEL RELEASE Right 01/05/2019  ? Procedure: RIGHT CARPAL TUNNEL RELEASE;  Surgeon: Leanora Cover, MD;   Location: [facility];  Service: Orthopedics;  Laterality: Right;  Bier block  ? COLONOSCOPY    ? CORONARY CT ANGIOGRAM  07/2017  ? Coronary calcium score 0?.  Focal noncalcific plaque (30%) in proximal LAD.  Otherwise no significant CAD.  Normal aortic root (3.2 cm)  ? HAMMER TOE SURGERY Bilateral   ? ingrown toenail surgery Bilateral   ? 2017  ? IR RADIOLOGY PERIPHERAL GUIDED IV START  07/25/2017  ? IR US GUIDE VASC ACCESS RIGHT  07/25/2017  ? MENISCUS REPAIR Right   ? 2016  ? ORIF WRIST FRACTURE Bilateral   ? PLANTAR FASCIA RELEASE Right   ? POLYPECTOMY    ? SHOULDER ARTHROSCOPY Left   ? TENDON REPAIR Right   ? right quad tendon repair  ? Tilt Table Test    ? Noted to have vasovagal syncope  ? TOTAL HIP ARTHROPLASTY Left 12/08/2020  ? Procedure: LEFT TOTAL HIP ARTHROPLASTY ANTERIOR APPROACH;  Surgeon: Mcarthur Rossetti, MD;  [facility];  Service: Orthopedics;  Laterality: Left;  Needs RNFA  ? TOTAL HIP ARTHROPLASTY Right 03/30/2021  ? Procedure: RIGHT TOTAL HIP ARTHROPLASTY ANTERIOR APPROACH;  Surgeon: Mcarthur Rossetti, MD;  Location[facility];  Service: Orthopedics;  Laterality: Right;  ? TRANSTHORACIC ECHOCARDIOGRAM  06/21/2015  ? Normal LV function.  EF 57%.  Mild left atrial enlargement, trace AI, trace MR, mild PI.  ? TRANSTHORACIC ECHOCARDIOGRAM  06/2017  ? EF 60-65%.  Mild diastolic dysfunction.  No significant valvular abnormality.  Mild aortic calcification.  ? WISDOM TOOTH EXTRACTION Bilateral   ? ?Patient Active Problem List  ? Diagnosis Date Noted  ? Status post total replacement of right hip 03/30/2021  ? Status post left hip replacement 12/08/2020  ? Unilateral primary osteoarthritis, left hip 10/16/2020  ? Unilateral primary osteoarthritis, right hip 10/16/2020  ? Hyperlipidemia due to dietary fat intake 10/01/2019  ? Morbid obesity ([place]) 10/01/2019  ? Excessive daytime sleepiness 03/03/2018  ? Obstructive sleep apnea treated with bilevel positive airway pressure  (BiPAP) 03/03/2018  ? Sleep paralysis, recurrent isolated 03/03/2018  ? Cataplexy 03/03/2018  ? Abnormal dreams 03/03/2018  ? Breathholding 03/03/2018  ? Hypersomnia, persistent 09/03/2017  ? Exertional dyspnea 06/20/2017  ? Vasovagal syncope 06/20/2017  ? Essential hypertension 06/20/2017  ? History of cardiomyopathy 06/20/2017  ? ? ?REFERRING PROVIDER: Mcarthur Rossetti* ?  ?REFERRING DIAG: M75.42 (ICD-10-CM) - Impingement syndrome of left shoulder ? ?ONSET DATE: 09/16/2021 ? ?THERAPY DIAG:  ?Abnormal posture ? ?Localized edema ? ?Muscle weakness (generalized) ? ?Stiffness of left shoulder, not elsewhere classified ? ?Chronic left shoulder pain ? ?PERTINENT HISTORY: S/P Lt shoulder arhtroscopy with SAD, distal clavicle resection.  History of DDD lumbar, skin cancer, HTN, hyperlipidemia, GERD.  History of bilateral hip replacements.  ? ?PRECAUTIONS: None ? ?SUBJECTIVE: Alexander [name] notes . ? ?PAIN:  ?Are you having pain? Yes ?NPRS scale: 1-5/10 this week ?Pain location: Lt shoulder ?Pain description: Constant ache, more sore with activity around the house (machine workout) ?Aggravating factors: Some various reaching, end range movements, overuse ?Relieving factors: Improved c movement and rest PRN ?  ?  ?OBJECTIVE:  ?  ?PATIENT SURVEYS:  ?12/20/2021 FOTO 57 (was 47, Goal 63); ?11/29/2021 FOTO intake: 47  predicted:  63 ?  ?COGNITION: ?11/29/2021 Overall cognitive status: Within functional limits for tasks assessed ?                                    ?SENSATION: ?11/29/2021 WFL ?  ?POSTURE: ?11/29/2021 : Mild rounded shoulders bilateral ?  ?UPPER EXTREMITY ROM:  ?  ?ROM Right ?11/29/2021 Left ?11/29/2021 ?Measured in supine Left ?12/06/2021 ?Measured in supine Left Supine 12/13/2021  ?Shoulder flexion WFL AROM 140 c ERP AROM 165 165  ?Shoulder extension        ?Shoulder abduction WFL AROM 90 deg c ERP AROM 145   ?Shoulder horizontal adduction      35  ?Shoulder internal rotation WFL AROM 40 deg in 45 deg abd AROM 70 deg in  45 deg abd 60 at 80 degrees abduction  ?Shoulder external rotation WFL AROM 40 deg in 45 deg abd AROM 65 deg in 45 deg abd 90 at 80 degrees abduction  ?Elbow flexion   WFL    ?Elbow extension   WFL    ?Wrist flexion        ?Wrist extension        ?Wrist ulnar deviation        ?Wrist radial deviation        ?Wrist pronation        ?Wrist supination        ?(Blank rows = not tested) ?  ?  UPPER EXTREMITY MMT: ?  ?MMT Right ?11/29/2021 Left ?11/29/2021 L/R in pounds with hand held dynamometer  ?Shoulder flexion 5/5 3+/5 c pain   ?Shoulder extension       ?Shoulder abduction 5/5 3+/5 c pain   ?Shoulder adduction       ?Shoulder internal rotation 5/5 4/5 c pain 39.9/35.0  ?Shoulder external rotation 5/5 3+/5 c pain 18.6/28.2  ?Middle trapezius       ?Lower trapezius       ?Elbow flexion 5/5 4/5 c pain   ?Elbow extension 5/5 5/5   ?Wrist flexion       ?Wrist extension       ?Wrist ulnar deviation       ?Wrist radial deviation       ?Wrist pronation       ?Wrist supination       ?Grip strength (lbs)       ?(Blank rows = not tested) ?  ?SHOULDER SPECIAL TESTS: ?            11/29/2021:  + Painful Arc Lt, - drop arm Lt ?  ?JOINT MOBILITY TESTING:  ?11/29/2021:  No mid range restriction noted in Lt Gh joint passive mobility ?  ?PALPATION:  ?11/29/2021: Mild tenderness around Lt shoulder joint, specifically in anterior, posterior aspects.  ?             ?TODAY'S TREATMENT:  ?12/31/2021: ?Therex:            ? ?Standing shoulder ER green band 2 x 15 bilateral with slow eccentrics ? ?Pull to chest (scapular retraction) Blue thera-band 2 sets of 20 slow eccentrics limited range ? ?Supine arm raises 20X 3 seconds with 6# (elbow stays straight, reach up straight to the ceiling) ? ?Prone 90 & 100 degrees with full shoulder ER (lead with thumb) 10X 3 seconds each (modified to supine and standing with yellow theraband) ? ?Shoulder blade pinches 10X 5 seconds ? ?UBE Push/Pull/Rest :20 intervals for 5 minutes, Level 5 ? ? ?12/20/2021: ?Therex:             ? ?Standing shoulder ER green band 2 x 15 bilateral with slow eccentrics ? ?Pull to chest (scapular retraction) Blue thera-band 2 sets of 20 slow eccentrics limited range ? ?Supine arm raises 20X 3 s

## 2022-01-02 DIAGNOSIS — E669 Obesity, unspecified: Secondary | ICD-10-CM | POA: Diagnosis not present

## 2022-01-02 DIAGNOSIS — I10 Essential (primary) hypertension: Secondary | ICD-10-CM | POA: Diagnosis not present

## 2022-01-02 DIAGNOSIS — R739 Hyperglycemia, unspecified: Secondary | ICD-10-CM | POA: Diagnosis not present

## 2022-01-02 DIAGNOSIS — E785 Hyperlipidemia, unspecified: Secondary | ICD-10-CM | POA: Diagnosis not present

## 2022-01-04 ENCOUNTER — Telehealth: Payer: Self-pay | Admitting: Cardiology

## 2022-01-04 NOTE — Telephone Encounter (Signed)
LMTCB

## 2022-01-04 NOTE — Telephone Encounter (Signed)
Received a message via pt schedule request with patient requesting to be contacted to discuss his Echo and ETT results.

## 2022-01-08 DIAGNOSIS — I10 Essential (primary) hypertension: Secondary | ICD-10-CM | POA: Diagnosis not present

## 2022-01-08 DIAGNOSIS — M5442 Lumbago with sciatica, left side: Secondary | ICD-10-CM | POA: Diagnosis not present

## 2022-01-08 DIAGNOSIS — E291 Testicular hypofunction: Secondary | ICD-10-CM | POA: Diagnosis not present

## 2022-01-08 DIAGNOSIS — R739 Hyperglycemia, unspecified: Secondary | ICD-10-CM | POA: Diagnosis not present

## 2022-01-08 DIAGNOSIS — F322 Major depressive disorder, single episode, severe without psychotic features: Secondary | ICD-10-CM | POA: Diagnosis not present

## 2022-01-08 DIAGNOSIS — G4733 Obstructive sleep apnea (adult) (pediatric): Secondary | ICD-10-CM | POA: Diagnosis not present

## 2022-01-09 NOTE — Telephone Encounter (Signed)
Left message to call back  

## 2022-01-11 ENCOUNTER — Ambulatory Visit (INDEPENDENT_AMBULATORY_CARE_PROVIDER_SITE_OTHER): Payer: Medicare Other | Admitting: Rehabilitative and Restorative Service Providers"

## 2022-01-11 ENCOUNTER — Telehealth: Payer: Self-pay | Admitting: Cardiology

## 2022-01-11 ENCOUNTER — Encounter: Payer: Self-pay | Admitting: Rehabilitative and Restorative Service Providers"

## 2022-01-11 DIAGNOSIS — G8929 Other chronic pain: Secondary | ICD-10-CM

## 2022-01-11 DIAGNOSIS — M25612 Stiffness of left shoulder, not elsewhere classified: Secondary | ICD-10-CM | POA: Diagnosis not present

## 2022-01-11 DIAGNOSIS — M6281 Muscle weakness (generalized): Secondary | ICD-10-CM | POA: Diagnosis not present

## 2022-01-11 DIAGNOSIS — R600 Localized edema: Secondary | ICD-10-CM

## 2022-01-11 DIAGNOSIS — M25512 Pain in left shoulder: Secondary | ICD-10-CM | POA: Diagnosis not present

## 2022-01-11 DIAGNOSIS — R293 Abnormal posture: Secondary | ICD-10-CM | POA: Diagnosis not present

## 2022-01-11 NOTE — Telephone Encounter (Signed)
Follow Up:      Patient is returning  a call from 01-09-22, concerning his test results.

## 2022-01-11 NOTE — Telephone Encounter (Signed)
Left message to call baclk

## 2022-01-11 NOTE — Therapy (Signed)
OUTPATIENT PHYSICAL THERAPY TREATMENT NOTE   Patient Name: Alexander [name] MRN: [id number] DOB:12-11-1950, 71 y.o., male Today's Date: 01/11/2022  PCP: Alexander Lopes, MD REFERRING PROVIDER: Blackman, Alexander [name] THERAPY DISCHARGE SUMMARY  Visits from Start of Care: 6  Current functional level related to goals / functional outcomes: See note   Remaining deficits: See note   Education / Equipment: Updated HEP   Patient agrees to discharge. Patient goals were met. Patient is being discharged due to being pleased with the current functional level.    END OF SESSION:   PT End of Session - 01/11/22 1607     Visit Number 6    Number of Visits 20    Date for PT Re-Evaluation 02/07/22    Authorization Type Medicare/Tricare    Progress Note Due on Visit 10    PT Start Time [phone]    PT Stop Time 0930    PT Time Calculation (min) 39 min    Activity Tolerance Patient tolerated treatment well;No increased pain    Behavior During Therapy WFL for tasks assessed/performed                 Past Medical History:  Diagnosis Date   Allergy    Cancer ([place])    skin cancer to upper chest/basal cell 2007   Chronic neck pain    DJD   Chronic pain in right foot    DDD (degenerative disc disease), lumbar    Dyslipidemia    Erectile dysfunction    Exogenous obesity    GERD (gastroesophageal reflux disease)    H/O acquired cardiomyopathy 1996   RESOLVED (apparently was thought to have had an MI, but did not have any intervention).  He developed cardiomyopathy that forced him to retire from [employer]..  The current EF 60-65%.   History of fracture due to fall 1988   Bilateral wrist fractures   Hyperlipidemia    Hypertension    Hypogonadism male    IBS (irritable bowel syndrome)    Obstructive sleep apnea    cpap since 1999   Pre-diabetes    on Metformin DAily with supper   Sleep apnea    CPAP nightly   Past Surgical History:  Procedure Laterality  Date   CARDIAC CATHETERIZATION  2000   Nonobstructive disease.  Persistently reduced EF   CARPAL TUNNEL RELEASE Left    CARPAL TUNNEL RELEASE Right 01/05/2019   Procedure: RIGHT CARPAL TUNNEL RELEASE;  Surgeon: Alexander Cover, MD;  Location: [facility];  Service: Orthopedics;  Laterality: Right;  Bier block   COLONOSCOPY     CORONARY CT ANGIOGRAM  07/2017   Coronary calcium score 0?.  Focal noncalcific plaque (30%) in proximal LAD.  Otherwise no significant CAD.  Normal aortic root (3.2 cm)   HAMMER TOE SURGERY Bilateral    ingrown toenail surgery Bilateral    2017   IR RADIOLOGY PERIPHERAL GUIDED IV START  07/25/2017   IR US GUIDE VASC ACCESS RIGHT  07/25/2017   MENISCUS REPAIR Right    2016   ORIF WRIST FRACTURE Bilateral    PLANTAR FASCIA RELEASE Right    POLYPECTOMY     SHOULDER ARTHROSCOPY Left    TENDON REPAIR Right    right quad tendon repair   Tilt Table Test     Noted to have vasovagal syncope   TOTAL HIP ARTHROPLASTY Left 12/08/2020   Procedure: LEFT TOTAL HIP ARTHROPLASTY ANTERIOR APPROACH;  Surgeon: Alexander Rossetti,  MD;  Location[facility];  Service: Orthopedics;  Laterality: Left;  Needs RNFA   TOTAL HIP ARTHROPLASTY Right 03/30/2021   Procedure: RIGHT TOTAL HIP ARTHROPLASTY ANTERIOR APPROACH;  Surgeon: Alexander Rossetti, MD;  Location: [facility];  Service: Orthopedics;  Laterality: Right;   TRANSTHORACIC ECHOCARDIOGRAM  06/21/2015   Normal LV function.  EF 57%.  Mild left atrial enlargement, trace AI, trace MR, mild PI.   TRANSTHORACIC ECHOCARDIOGRAM  06/2017   EF 60-65%.  Mild diastolic dysfunction.  No significant valvular abnormality.  Mild aortic calcification.   WISDOM TOOTH EXTRACTION Bilateral    Patient Active Problem List   Diagnosis Date Noted   Status post total replacement of right hip 03/30/2021   Status post left hip replacement 12/08/2020   Unilateral primary osteoarthritis, left hip 10/16/2020   Unilateral primary  osteoarthritis, right hip 10/16/2020   Hyperlipidemia due to dietary fat intake 10/01/2019   Morbid obesity ([place]) 10/01/2019   Excessive daytime sleepiness 03/03/2018   Obstructive sleep apnea treated with bilevel positive airway pressure (BiPAP) 03/03/2018   Sleep paralysis, recurrent isolated 03/03/2018   Cataplexy 03/03/2018   Abnormal dreams 03/03/2018   Breathholding 03/03/2018   Hypersomnia, persistent 09/03/2017   Exertional dyspnea 06/20/2017   Vasovagal syncope 06/20/2017   Essential hypertension 06/20/2017   History of cardiomyopathy 06/20/2017    REFERRING PROVIDER: Mcarthur [name]*   REFERRING DIAG: M07.68 (ICD-10-CM) - Impingement syndrome of left shoulder  ONSET DATE: 09/16/2021  THERAPY DIAG:  Abnormal posture  Stiffness of left shoulder, not elsewhere classified  Chronic left shoulder pain  Localized edema  Muscle weakness (generalized)  PERTINENT HISTORY: S/P Lt shoulder arhtroscopy with SAD, distal clavicle resection.  History of DDD lumbar, skin cancer, HTN, hyperlipidemia, GERD.  History of bilateral hip replacements.   PRECAUTIONS: None  SUBJECTIVE: Alexander [name] notes he is pleased with his progress with his Left shoulder.  He feels ready to transfer into independent rehabilitation.  PAIN:  Are you having pain? Yes NPRS scale: 1-5/10 this week (1X prescription pain meds this week after heavy house chores) Pain location: Lt shoulder Pain description: Constant ache, more sore with activity around the house (machine workout) Aggravating factors: Some various reaching, end range movements, overuse Relieving factors: Improved c movement and rest PRN     OBJECTIVE:    PATIENT SURVEYS:  01/11/2022 FOTO 81 12/20/2021 FOTO 57 (was 47, Goal 63); 11/29/2021 FOTO intake: 47  predicted:  63   COGNITION: 11/29/2021 Overall cognitive status: Within functional limits for tasks assessed                                     SENSATION: 11/29/2021 WFL    POSTURE: 11/29/2021 : Mild rounded shoulders bilateral   UPPER EXTREMITY ROM:    ROM Right 11/29/2021 Left 11/29/2021 Measured in supine Left 12/06/2021 Measured in supine Left Supine 12/13/2021 L/R 01/11/2022 in degrees  Shoulder flexion WFL AROM 140 c ERP AROM 165 165 160/165  Shoulder extension         Shoulder abduction WFL AROM 90 deg c ERP AROM 145    Shoulder horizontal adduction      35 40/35  Shoulder internal rotation WFL AROM 40 deg in 45 deg abd AROM 70 deg in 45 deg abd 60 at 80 degrees abduction 55/40 at 80 degrees abduction  Shoulder external rotation WFL AROM 40 deg in 45 deg abd AROM 65 deg in 45  deg abd 90 at 80 degrees abduction 90/90 degrees at 80 degrees abduction  Elbow flexion   WFL     Elbow extension   [facility]     Wrist flexion         Wrist extension         Wrist ulnar deviation         Wrist radial deviation         Wrist pronation         Wrist supination         (Blank rows = not tested)   UPPER EXTREMITY MMT:   MMT Right 11/29/2021 Left 11/29/2021 L/R in pounds with hand held dynamometer L/R in pounds with hand held dynamometer  Shoulder flexion 5/5 3+/5 c pain    Shoulder extension        Shoulder abduction 5/5 3+/5 c pain    Shoulder adduction        Shoulder internal rotation 5/5 4/5 c pain 39.9/35.0 41.7/40.9  Shoulder external rotation 5/5 3+/5 c pain 18.6/28.2 24.2/32.4  Middle trapezius        Lower trapezius        Elbow flexion 5/5 4/5 c pain    Elbow extension 5/5 5/5    Wrist flexion        Wrist extension        Wrist ulnar deviation        Wrist radial deviation        Wrist pronation        Wrist supination        Grip strength (lbs)        (Blank rows = not tested)   SHOULDER SPECIAL TESTS:             11/29/2021:  + Painful Arc Lt, - drop arm Lt   JOINT MOBILITY TESTING:  11/29/2021:  No mid range restriction noted in Lt Gh joint passive mobility   PALPATION:  11/29/2021: Mild tenderness around Lt shoulder joint, specifically  in anterior, posterior aspects.               TODAY'S TREATMENT:  01/11/2022: Therex:             Standing shoulder ER green band 2 x 15 bilateral with slow eccentrics  Pull to chest (scapular retraction) Blue thera-band 20X slow eccentrics limited range  Supine arm raises 20X 3 seconds with 6# (elbow stays straight, reach up straight to the ceiling)  Posterior capsule stretch 5X 10 seconds  Thumb up the back stretch 5X 10 seconds  Functional Activities: Reassessment, review long-term HEP, reassessment results and answer questions regarding independent rehabilitation   12/31/2021: Therex:             Standing shoulder ER green band 2 x 15 bilateral with slow eccentrics  Pull to chest (scapular retraction) Blue thera-band 2 sets of 20 slow eccentrics limited range  Supine arm raises 20X 3 seconds with 6# (elbow stays straight, reach up straight to the ceiling)  Prone 90 & 100 degrees with full shoulder ER (lead with thumb) 10X 3 seconds each (modified to supine and standing with yellow theraband)  Shoulder blade pinches 10X 5 seconds  UBE Push/Pull/Rest :20 intervals for 5 minutes, Level 5   12/20/2021: Therex:             Standing shoulder ER green band 2 x 15 bilateral with slow eccentrics  Pull to chest (scapular retraction) Blue thera-band 2 sets of 20 slow  eccentrics limited range  Supine arm raises 20X 3 seconds with 6# (elbow stays straight, reach up straight to the ceiling)  Prone 90 & 100 degrees with full shoulder ER (lead with thumb) 5X 3 seconds each  Shoulder blade pinches 10X 5 seconds  UBE Push/Pull/Rest :20 intervals for 8 minutes, Level 4     PATIENT EDUCATION: 12/06/2021 Education details: HEP progression, reviewed RA findings Person educated: Patient Education method: Consulting civil engineer, Demonstration, Verbal cues, and Handouts Education comprehension: verbalized understanding and returned demonstration     HOME EXERCISE PROGRAM: Access Code:  [email or web address] URL: [email or web address] Date: 01/11/2022 Prepared by: Vista Mink  Exercises - Shoulder External Rotation with Anchored Resistance  - 1 x daily - 3 x weekly - 2-3 sets - 15 reps - Supine Scapular Protraction in Flexion with Dumbbells  - 1 x daily - 3 x weekly - 1 sets - 20 reps - 3 seconds hold - Scapular Retraction with Resistance  - 1 x daily - 3 x weekly - 1 sets - 20 reps - 3 seconds hold - Prone Shoulder Horizontal Abduction with Thumbs Up  - 1 x daily - 3 x weekly - 1-2 sets - 5-10 reps - 3 seconds hold - Standing Scapular Retraction  - 5 x daily - 7 x weekly - 1 sets - 5 reps - 5 second hold - Standing Shoulder Internal Rotation Stretch with Hands Behind Back  - 1 x daily - 3 x weekly - 1 sets - 10 reps - 10 seconds hold - Standing Shoulder Posterior Capsule Stretch  - 1 x daily - 3 x weekly - 1 sets - 5-10 reps - 10 seconds hold  ASSESSMENT:   CLINICAL IMPRESSION: Antron has improved his AROM, strength, pain and self-reported function since starting PT.  His HEP is addressing remaining impairments and Fitz[place] has demonstrated independence and compliance with this program.  He reports he is ready to transfer into independent rehabilitation.     OBJECTIVE IMPAIRMENTS decreased coordination, decreased endurance, decreased mobility, decreased ROM, decreased strength, hypomobility, increased edema, impaired perceived functional ability, impaired flexibility, impaired UE functional use, improper body mechanics, postural dysfunction, and pain.    ACTIVITY LIMITATIONS cleaning, community activity, driving, meal prep, laundry, and yard work.    PERSONAL FACTORS  History of DDD lumbar, skin cancer, HTN, hyperlipidemia, GERD  are also affecting patient's functional outcome.      REHAB POTENTIAL: Good   CLINICAL DECISION MAKING: Stable/uncomplicated   EVALUATION COMPLEXITY: Low     GOALS: Goals reviewed with patient? Yes Eval Date; 11/29/2021 Short term PT  Goals (target date for Short term goals are 3 weeks 12/20/2021) Patient will demonstrate independent use of home exercise program to maintain progress from in clinic treatments. Goal status: Met - assessed 12/13/2021   Long term PT goals (target dates for all long term goals are 10 weeks  02/07/2022 )   1. Patient will demonstrate/report pain at worst less than or equal to 2/10 to facilitate minimal limitation in daily activity secondary to pain symptoms. Goal status: On Going 01/11/2022  2. Patient will demonstrate independent use of home exercise program to facilitate ability to maintain/progress functional gains from skilled physical therapy services. Goal status: Met 01/11/2022   3. Patient will demonstrate FOTO outcome > or = 63 % to indicate reduced disability due to condition. Goal status: Met 01/11/2022   4.  Patient will demonstrate Lt [facility] joint mobility WFL to facilitate usual self care, dressing, reaching overhead at PLOF s limitation due  to symptoms.  Goal status: Met 12/20/2021   5.  Patient will demonstrate Lt UE MMT 5/5 throughout to facilitate usual lifting, carrying in functional activity to PLOF s limitation.   Goal status: On Going 01/11/2022   6.  Patient will demonstrate/report ability to sleep s restriction due to symptoms.   Goal status: Met 12/20/2021       PLAN: PT FREQUENCY: DC  PT DURATION: DC   PLANNED INTERVENTIONS: Therapeutic exercises, Therapeutic activity, Neuro Muscular re-education, Balance training, Gait training, Patient/Family education, Joint mobilization, Stair training, DME instructions, Dry Needling, Electrical stimulation, Cryotherapy, Moist heat, Taping, Ultrasound, Ionotophoresis 76m/ml Dexamethasone, and Manual therapy.  All included unless contraindicated   PLAN FOR NEXT SESSION: DC  RFarley LyPT, MPT 01/11/22  4:16 PM

## 2022-01-17 ENCOUNTER — Ambulatory Visit (INDEPENDENT_AMBULATORY_CARE_PROVIDER_SITE_OTHER): Payer: Medicare Other | Admitting: Orthopaedic Surgery

## 2022-01-17 DIAGNOSIS — Z9889 Other specified postprocedural states: Secondary | ICD-10-CM

## 2022-01-17 NOTE — Progress Notes (Signed)
The patient is now 10 weeks status post a left shoulder arthroscopy.  He reports much improved range of motion and strength and decreased pain.  He has finished physical therapy.  I agree his shoulder is moving much better.  His pain seems to be less and overall he is just doing well.  From my standpoint we will see him back in August which will be a regular scheduled visit for him for his hip replacements.  We just need a standing low AP pelvis at that visit.

## 2022-01-22 DIAGNOSIS — E291 Testicular hypofunction: Secondary | ICD-10-CM | POA: Diagnosis not present

## 2022-02-22 ENCOUNTER — Other Ambulatory Visit: Payer: Self-pay | Admitting: Neurology

## 2022-02-25 MED ORDER — MODAFINIL 200 MG PO TABS: 200.0000 mg | ORAL_TABLET | Freq: Every day | ORAL | 2 refills | Status: DC

## 2022-02-25 NOTE — Telephone Encounter (Signed)
Last OV was on 07/17/21.  Next OV is scheduled for 07/16/22.  Last RX was written on 12/29/21 for 30 tabs.   Pt requesting 90 day refill, if ok please refill.  [place] [facility] has been reviewed.

## 2022-03-15 ENCOUNTER — Telehealth: Payer: Self-pay | Admitting: *Deleted

## 2022-03-15 NOTE — Telephone Encounter (Signed)
Ortho bundle 1 year call completed for both Left THA (12/08/20) and Right THA (03/30/21) done by Dr. [name].

## 2022-04-15 ENCOUNTER — Ambulatory Visit (INDEPENDENT_AMBULATORY_CARE_PROVIDER_SITE_OTHER): Payer: Medicare Other | Admitting: Orthopaedic Surgery

## 2022-04-15 ENCOUNTER — Ambulatory Visit (INDEPENDENT_AMBULATORY_CARE_PROVIDER_SITE_OTHER): Payer: Medicare Other

## 2022-04-15 ENCOUNTER — Encounter: Payer: Self-pay | Admitting: Orthopaedic Surgery

## 2022-04-15 DIAGNOSIS — Z96643 Presence of artificial hip joint, bilateral: Secondary | ICD-10-CM

## 2022-04-15 DIAGNOSIS — M7542 Impingement syndrome of left shoulder: Secondary | ICD-10-CM | POA: Diagnosis not present

## 2022-04-15 MED ORDER — LIDOCAINE HCL 1 % IJ SOLN
3.0000 mL | INTRAMUSCULAR | Status: AC | PRN
  Administered 2022-04-15: 3 mL

## 2022-04-15 MED ORDER — METHYLPREDNISOLONE ACETATE 40 MG/ML IJ SUSP
40.0000 mg | INTRAMUSCULAR | Status: AC | PRN
  Administered 2022-04-15: 40 mg via INTRA_ARTICULAR

## 2022-04-15 NOTE — Progress Notes (Signed)
Office Visit Note   Patient: Alexander [name]           Date of Birth: 07/18/1951           MRN: [id number] Visit Date: 04/15/2022              Requested by: Donnajean Lopes, MD [place],  [place] [address] PCP: Donnajean Lopes, MD   Assessment & Plan: Visit Diagnoses:  1. History of bilateral total hip arthroplasty   2. Impingement syndrome of left shoulder     Plan: Being 5 months out from a left shoulder arthroscopy I thought it was reasonable to try a subacromial injection with a steroid in his left shoulder today and he agreed to this.  He will work on modifying his exercise routine.  Follow-up for his shoulder and his hips can be as needed at this standpoint.  All questions and concerns were answered and addressed.  If he has any issues at all he knows to let us know.  Follow-Up Instructions: Return if symptoms worsen or fail to improve.   Orders:  Orders Placed This Encounter  Procedures   Large Joint Inj   XR Pelvis 1-2 Views   No orders of the defined types were placed in this encounter.     Procedures: Large Joint Inj on 04/15/2022 8:32 AM Indications: pain and diagnostic evaluation Details: 22 G 1.5 in needle  Arthrogram: No  Medications: 3 mL lidocaine 1 %; 40 mg methylPREDNISolone acetate 40 MG/ML Outcome: tolerated well, no immediate complications Procedure, treatment alternatives, risks and benefits explained, specific risks discussed. Consent was given by the patient. Immediately prior to procedure a time out was called to verify the correct patient, procedure, equipment, support staff and site/side marked as required. Patient was prepped and draped in the usual sterile fashion.       Clinical Data: No additional findings.   Subjective: Chief Complaint  Patient presents with   Left Hip - Follow-up   Right Hip - Follow-up  The patient is a very young and athletic 71 year old gentleman who has both his hips replaced that we did  in April 2022 for left hip and August 2022 for the right hip.  He is a year out from surgery is now.  He says the hips are doing great.  He still having some soreness in his left shoulder that we did perform arthroscopic surgery on earlier this year about 5 months ago in March.  He does a lot of exercises that use his shoulders.  Otherwise he is walking without assistive device and has no significant complaints.  He reports being pretty healthy.  HPI  Review of Systems There is no listed fever, chills, nausea, vomiting  Objective: Vital Signs: There were no vitals taken for this visit.  Physical Exam He is alert and oriented x3 and in no acute distress Ortho Exam Both operative hips move smoothly and fluidly with no blocks to rotation.  His leg lengths are equal.  His left shoulder still shows just some mild signs of impingement but no weakness and excellent range of motion. Specialty Comments:  No specialty comments available.  Imaging: XR Pelvis 1-2 Views  Result Date: 04/15/2022 An AP pelvis standing shows well-seated bilateral total hip arthroplasties with no complicating features.    PMFS History: Patient Active Problem List   Diagnosis Date Noted   Status post total replacement of right hip 03/30/2021   Status post left hip replacement 12/08/2020  Unilateral primary osteoarthritis, left hip 10/16/2020   Unilateral primary osteoarthritis, right hip 10/16/2020   Hyperlipidemia due to dietary fat intake 10/01/2019   Morbid obesity ([place]) 10/01/2019   Excessive daytime sleepiness 03/03/2018   Obstructive sleep apnea treated with bilevel positive airway pressure (BiPAP) 03/03/2018   Sleep paralysis, recurrent isolated 03/03/2018   Cataplexy 03/03/2018   Abnormal dreams 03/03/2018   Breathholding 03/03/2018   Hypersomnia, persistent 09/03/2017   Exertional dyspnea 06/20/2017   Vasovagal syncope 06/20/2017   Essential hypertension 06/20/2017   History of cardiomyopathy  06/20/2017   Past Medical History:  Diagnosis Date   Allergy    Cancer ([place])    skin cancer to upper chest/basal cell 2007   Chronic neck pain    DJD   Chronic pain in right foot    DDD (degenerative disc disease), lumbar    Dyslipidemia    Erectile dysfunction    Exogenous obesity    GERD (gastroesophageal reflux disease)    H/O acquired cardiomyopathy 1996   RESOLVED (apparently was thought to have had an MI, but did not have any intervention).  He developed cardiomyopathy that forced him to retire from [employer]..  The current EF 60-65%.   History of fracture due to fall 1988   Bilateral wrist fractures   Hyperlipidemia    Hypertension    Hypogonadism male    IBS (irritable bowel syndrome)    Obstructive sleep apnea    cpap since 1999   Pre-diabetes    on Metformin DAily with supper   Sleep apnea    CPAP nightly    Family History  Problem Relation Age of Onset   Cancer Mother        duodenal CA   Heart disease Mother    Depression Mother    Stomach cancer Mother    Cancer Father    Hypertension Father    High Cholesterol Father    Heart disease Father    Diabetes Father    Hypertension Sister    High Cholesterol Sister    Heart disease Sister    Esophageal cancer Neg Hx    Rectal cancer Neg Hx    Colon cancer Neg Hx    Colon polyps Neg Hx     Past Surgical History:  Procedure Laterality Date   CARDIAC CATHETERIZATION  2000   Nonobstructive disease.  Persistently reduced EF   CARPAL TUNNEL RELEASE Left    CARPAL TUNNEL RELEASE Right 01/05/2019   Procedure: RIGHT CARPAL TUNNEL RELEASE;  Surgeon: Leanora Cover, MD;  Location: [facility];  Service: Orthopedics;  Laterality: Right;  Bier block   COLONOSCOPY     CORONARY CT ANGIOGRAM  07/2017   Coronary calcium score 0?.  Focal noncalcific plaque (30%) in proximal LAD.  Otherwise no significant CAD.  Normal aortic root (3.2 cm)   HAMMER TOE SURGERY Bilateral    ingrown toenail surgery  Bilateral    2017   IR RADIOLOGY PERIPHERAL GUIDED IV START  07/25/2017   IR US GUIDE VASC ACCESS RIGHT  07/25/2017   MENISCUS REPAIR Right    2016   ORIF WRIST FRACTURE Bilateral    PLANTAR FASCIA RELEASE Right    POLYPECTOMY     SHOULDER ARTHROSCOPY Left    TENDON REPAIR Right    right quad tendon repair   Tilt Table Test     Noted to have vasovagal syncope   TOTAL HIP ARTHROPLASTY Left 12/08/2020   Procedure: LEFT TOTAL HIP ARTHROPLASTY  ANTERIOR APPROACH;  Surgeon: Mcarthur Rossetti, MD;  Location: [facility];  Service: Orthopedics;  Laterality: Left;  Needs RNFA   TOTAL HIP ARTHROPLASTY Right 03/30/2021   Procedure: RIGHT TOTAL HIP ARTHROPLASTY ANTERIOR APPROACH;  Surgeon: Mcarthur Rossetti, MD;  Location: [facility];  Service: Orthopedics;  Laterality: Right;   TRANSTHORACIC ECHOCARDIOGRAM  06/21/2015   Normal LV function.  EF 57%.  Mild left atrial enlargement, trace AI, trace MR, mild PI.   TRANSTHORACIC ECHOCARDIOGRAM  06/2017   EF 60-65%.  Mild diastolic dysfunction.  No significant valvular abnormality.  Mild aortic calcification.   WISDOM TOOTH EXTRACTION Bilateral    Social History   Occupational History   Not on file  Tobacco Use   Smoking status: Former    Types: Cigarettes    Quit date: 1996    Years since quitting: 27.6   Smokeless tobacco: Never  Vaping Use   Vaping Use: Never used  Substance and Sexual Activity   Alcohol use: Yes    Alcohol/week: 2.0 standard drinks of alcohol    Types: 2 Standard drinks or equivalent per week    Comment: social   Drug use: No   Sexual activity: Not on file

## 2022-07-15 ENCOUNTER — Encounter: Payer: Self-pay | Admitting: *Deleted

## 2022-07-15 NOTE — Progress Notes (Unsigned)
PATIENT: Alexander [name] DOB: Dec 23, 1950  REASON FOR VISIT: follow up HISTORY FROM: patient PRIMARY NEUROLOGIST:   Virtual Visit via Video Note  I connected with Alexander [name] on 07/16/22 at  2:45 PM EST by a video enabled telemedicine application located remotely at [facility] and verified that I am speaking with the correct person using two identifiers who was located at their own home.   I discussed the limitations of evaluation and management by telemedicine and the availability of in person appointments. The patient expressed understanding and agreed to proceed.   PATIENT: Alexander [name] DOB: 1951/05/05  REASON FOR VISIT: follow up HISTORY FROM: patient  HISTORY OF PRESENT ILLNESS: Today 07/16/22:  Mr. Habash is a 71 year old male with a history of obstructive sleep apnea on CPAP.  He returns today for follow-up.  His download is below.  He denies any new issues.  Reports that the CPAP continues to work well for him.  Continues on modafinil 200 mg daily.  Denies falling asleep while driving.  He states that if he sits still long enough without any engagement he may doze off.    07/14/21: Mr. Dromgoole is a 71 year old male with a history of narcolepsy and obstructive sleep apnea on CPAP.  His download is attached.  He reports that CPAP is working well for him.  He denies any new issues.  Continues on modafinil 200 mg daily.   HISTORY 07/16/20:   Mr. Olds is a 71 year old male with a history of narcolepsy and obstructive sleep apnea on CPAP.  His download indicates that he uses machine nightly for compliance of 100%.  He uses machine greater than 4 hours 28 days for compliance of 93%.  On average he uses his machine 5 hours and 53 minutes.  His residual AHI is 3.5 on 512 cm of water with EPR of 2.  Leak in the 95th percentile is 18.2 L/min.  Reports that the CPAP is working well for him.  He denies any new issues.   He remains on  modafinil 200 mg daily.  Reports that this continues to work well for him.  Reports that when he sneezes he feels a slight loss of muscle tone but does not fall to the ground.  Continues to operate a motor vehicle.  No issues falling asleep while driving.  He returns today for an evaluation.  REVIEW OF SYSTEMS: Out of a complete 14 system review of symptoms, the patient complains only of the following symptoms, and all other reviewed systems are negative.  ALLERGIES: No Known Allergies  HOME MEDICATIONS: Outpatient Medications Prior to Visit  Medication Sig Dispense Refill   aspirin EC 81 MG tablet Take 81 mg by mouth daily. Swallow whole.     Calcium Carb-Cholecalciferol (CALCIUM 600/VITAMIN D PO) Take 1 tablet by mouth daily.     carvedilol (COREG) 12.5 MG tablet Take 1 tablet (12.5 mg total) by mouth 2 (two) times daily with a meal. 180 tablet 3   losartan-hydrochlorothiazide (HYZAAR) 100-25 MG tablet Take 1 tablet by mouth daily. 180 tablet 3   metFORMIN (GLUCOPHAGE-XR) 500 MG 24 hr tablet Take 500 mg by mouth daily with supper.     methocarbamol (ROBAXIN) 500 MG tablet Take 1 tablet (500 mg total) by mouth every 6 (six) hours as needed. 40 tablet 1   modafinil (PROVIGIL) 200 MG tablet Take 1 tablet (200 mg total) by mouth daily. 90 tablet 2   Multiple Vitamin (MULTIVITAMIN WITH MINERALS)  TABS tablet Take 1 tablet by mouth daily.     oxyCODONE (ROXICODONE) 5 MG immediate release tablet Take 1 tablet (5 mg total) by mouth every 6 (six) hours as needed for severe pain. 30 tablet 0   Rosuvastatin Calcium 20 MG CPSP 1 tablet     simvastatin (ZOCOR) 20 MG tablet Take 1 tablet (20 mg total) by mouth at bedtime. 90 tablet 3   Testosterone 30 MG/ACT SOLN 1 Pump daily. Apply under the arms     topiramate (TOPAMAX) 25 MG tablet Take 12.5 mg by mouth at bedtime.  12   vitamin B-12 (CYANOCOBALAMIN) 1000 MCG tablet Take 3,000 mcg by mouth daily.     No facility-administered medications prior to  visit.    PAST MEDICAL HISTORY: Past Medical History:  Diagnosis Date   Allergy    Cancer (HCC)    skin cancer to upper chest/basal cell 2007   Chronic neck pain    DJD   Chronic pain in right foot    DDD (degenerative disc disease), lumbar    Dyslipidemia    Erectile dysfunction    Exogenous obesity    GERD (gastroesophageal reflux disease)    H/O acquired cardiomyopathy 1996   RESOLVED (apparently was thought to have had an MI, but did not have any intervention).  He developed cardiomyopathy that forced him to retire from [employer]..  The current EF 60-65%.   History of fracture due to fall 1988   Bilateral wrist fractures   Hyperlipidemia    Hypertension    Hypogonadism male    IBS (irritable bowel syndrome)    Obstructive sleep apnea    cpap since 1999   Pre-diabetes    on Metformin DAily with supper   Sleep apnea    CPAP nightly    PAST SURGICAL HISTORY: Past Surgical History:  Procedure Laterality Date   CARDIAC CATHETERIZATION  2000   Nonobstructive disease.  Persistently reduced EF   CARPAL TUNNEL RELEASE Left    CARPAL TUNNEL RELEASE Right 01/05/2019   Procedure: RIGHT CARPAL TUNNEL RELEASE;  Surgeon: Betha Loa, MD;  Location: [facility];  Service: Orthopedics;  Laterality: Right;  Bier block   COLONOSCOPY     CORONARY CT ANGIOGRAM  07/2017   Coronary calcium score 0?.  Focal noncalcific plaque (30%) in proximal LAD.  Otherwise no significant CAD.  Normal aortic root (3.2 cm)   HAMMER TOE SURGERY Bilateral    ingrown toenail surgery Bilateral    2017   IR RADIOLOGY PERIPHERAL GUIDED IV START  07/25/2017   IR US GUIDE VASC ACCESS RIGHT  07/25/2017   MENISCUS REPAIR Right    2016   ORIF WRIST FRACTURE Bilateral    PLANTAR FASCIA RELEASE Right    POLYPECTOMY     SHOULDER ARTHROSCOPY Left    TENDON REPAIR Right    right quad tendon repair   Tilt Table Test     Noted to have vasovagal syncope   TOTAL HIP ARTHROPLASTY Left 12/08/2020    Procedure: LEFT TOTAL HIP ARTHROPLASTY ANTERIOR APPROACH;  Surgeon: Kathryne Hitch, MD;  Location: [facility];  Service: Orthopedics;  Laterality: Left;  Needs RNFA   TOTAL HIP ARTHROPLASTY Right 03/30/2021   Procedure: RIGHT TOTAL HIP ARTHROPLASTY ANTERIOR APPROACH;  Surgeon: Kathryne Hitch, MD;  Location: [facility];  Service: Orthopedics;  Laterality: Right;   TRANSTHORACIC ECHOCARDIOGRAM  06/21/2015   Normal LV function.  EF 57%.  Mild left atrial enlargement, trace AI, trace  MR, mild PI.   TRANSTHORACIC ECHOCARDIOGRAM  06/2017   EF 60-65%.  Mild diastolic dysfunction.  No significant valvular abnormality.  Mild aortic calcification.   WISDOM TOOTH EXTRACTION Bilateral     FAMILY HISTORY: Family History  Problem Relation Age of Onset   Cancer Mother        duodenal CA   Heart disease Mother    Depression Mother    Stomach cancer Mother    Cancer Father    Hypertension Father    High Cholesterol Father    Heart disease Father    Diabetes Father    Hypertension Sister    High Cholesterol Sister    Heart disease Sister    Esophageal cancer Neg Hx    Rectal cancer Neg Hx    Colon cancer Neg Hx    Colon polyps Neg Hx     SOCIAL HISTORY: Social History   Socioeconomic History   Marital status: Married    Spouse name: Not on file   Number of children: Not on file   Years of education: Not on file   Highest education level: Not on file  Occupational History   Not on file  Tobacco Use   Smoking status: Former    Types: Cigarettes    Quit date: 1996    Years since quitting: 27.9   Smokeless tobacco: Never  Vaping Use   Vaping Use: Never used  Substance and Sexual Activity   Alcohol use: Yes    Alcohol/week: 2.0 standard drinks of alcohol    Types: 2 Standard drinks or equivalent per week    Comment: social   Drug use: No   Sexual activity: Not on file  Other Topics Concern   Not on file  Social History Narrative   He has been married for 36 years.      Son (born 23) -lives in [place]   Daughter (born 37) also lives in [place].   Retired from [employer].  Enjoys doing house remodeling.   Remote history of smoking, stopped in 1996.   Minimal alcohol use.   Social Determinants of Health   Financial Resource Strain: Not on file  Food Insecurity: Not on file  Transportation Needs: Not on file  Physical Activity: Not on file  Stress: Not on file  Social Connections: Not on file  Intimate Partner Violence: Not on file      PHYSICAL EXAM Generalized: Well developed, in no acute distress   Neurological examination  Mentation: Alert oriented to time, place, history taking. Follows all commands speech and language fluent Cranial nerve II-XII:Extraocular movements were full. Facial symmetry noted. Head turning and shoulder shrug  were normal and symmetric.  DIAGNOSTIC DATA (LABS, IMAGING, TESTING) - I reviewed patient records, labs, notes, testing and imaging myself where available.  Lab Results  Component Value Date   WBC 8.1 04/01/2021   HGB 12.2 (L) 04/01/2021   HCT 37.4 (L) 04/01/2021   MCV 93.0 04/01/2021   PLT 155 04/01/2021      Component Value Date/Time   NA 139 03/31/2021 0341   NA 145 (H) 06/26/2017 1049   K 3.5 03/31/2021 0341   CL 103 03/31/2021 0341   CO2 28 03/31/2021 0341   GLUCOSE 131 (H) 03/31/2021 0341   BUN 14 03/31/2021 0341   BUN 16 06/26/2017 1049   CREATININE 0.91 03/31/2021 0341   CALCIUM 8.5 (L) 03/31/2021 0341   GFRNONAA >60 03/31/2021 0341   GFRAA >60 01/01/2019 1224  No results found for: "CHOL", "HDL", "LDLCALC", "LDLDIRECT", "TRIG", "CHOLHDL" Lab Results  Component Value Date   HGBA1C 5.9 (H) 03/19/2021   No results found for: "VITAMINB12" No results found for: "TSH"    ASSESSMENT AND PLAN 71 y.o. year old male  has a past medical history of Allergy, Cancer (HCC), Chronic neck pain, Chronic pain in right foot, DDD (degenerative disc disease), lumbar, Dyslipidemia, Erectile  dysfunction, Exogenous obesity, GERD (gastroesophageal reflux disease), H/O acquired cardiomyopathy (1996), History of fracture due to fall (1988), Hyperlipidemia, Hypertension, Hypogonadism male, IBS (irritable bowel syndrome), Obstructive sleep apnea, Pre-diabetes, and Sleep apnea. here with:  OSA on CPAP  CPAP compliance excellent Residual AHI is good Encouraged patient to continue using CPAP nightly and > 4 hours each night  Narcolepsy Continue modafinil 200 mg daily  F/U in 1 year or sooner if needed    Butch Penny, MSN, NP-C 07/16/2022, 2:48 PM [facility] [address], [address] [place], [place] [address] [phone]

## 2022-07-16 ENCOUNTER — Telehealth (INDEPENDENT_AMBULATORY_CARE_PROVIDER_SITE_OTHER): Payer: Medicare Other | Admitting: Adult Health

## 2022-07-16 DIAGNOSIS — G47411 Narcolepsy with cataplexy: Secondary | ICD-10-CM

## 2022-07-16 DIAGNOSIS — G4733 Obstructive sleep apnea (adult) (pediatric): Secondary | ICD-10-CM

## 2022-07-19 DIAGNOSIS — L570 Actinic keratosis: Secondary | ICD-10-CM | POA: Diagnosis not present

## 2022-07-19 DIAGNOSIS — D235 Other benign neoplasm of skin of trunk: Secondary | ICD-10-CM | POA: Diagnosis not present

## 2022-07-19 DIAGNOSIS — Z85828 Personal history of other malignant neoplasm of skin: Secondary | ICD-10-CM | POA: Diagnosis not present

## 2022-07-19 DIAGNOSIS — D225 Melanocytic nevi of trunk: Secondary | ICD-10-CM | POA: Diagnosis not present

## 2022-07-19 DIAGNOSIS — L918 Other hypertrophic disorders of the skin: Secondary | ICD-10-CM | POA: Diagnosis not present

## 2022-07-19 DIAGNOSIS — L821 Other seborrheic keratosis: Secondary | ICD-10-CM | POA: Diagnosis not present

## 2022-07-19 DIAGNOSIS — L814 Other melanin hyperpigmentation: Secondary | ICD-10-CM | POA: Diagnosis not present

## 2022-08-27 ENCOUNTER — Other Ambulatory Visit: Payer: Self-pay | Admitting: Neurology

## 2022-08-28 ENCOUNTER — Encounter: Payer: Self-pay | Admitting: Adult Health

## 2022-08-28 NOTE — Telephone Encounter (Signed)
Last seen 07-11-22 with MM/NP.  Pt taking '200mg'$  modafinil once daily.  Next appt 07-15-2023.  Will cancel the [facility] prescription/ done.

## 2022-09-04 DIAGNOSIS — H35033 Hypertensive retinopathy, bilateral: Secondary | ICD-10-CM | POA: Diagnosis not present

## 2022-09-30 ENCOUNTER — Encounter: Payer: Self-pay | Admitting: Adult Health

## 2022-09-30 MED ORDER — MODAFINIL 200 MG PO TABS
ORAL_TABLET | ORAL | 1 refills | Status: DC
Start: 2022-09-30 — End: 2023-05-08

## 2022-09-30 NOTE — Telephone Encounter (Signed)
Last visit: 07/16/22 Next visit: 07/15/23  Per [place] registry:   Rx refills sent to MM NP to have on file w/ [facility]

## 2022-10-14 ENCOUNTER — Other Ambulatory Visit: Payer: Self-pay | Admitting: Cardiology

## 2022-10-24 ENCOUNTER — Encounter: Payer: Self-pay | Admitting: Radiology

## 2022-11-06 DIAGNOSIS — H2513 Age-related nuclear cataract, bilateral: Secondary | ICD-10-CM | POA: Diagnosis not present

## 2022-11-06 DIAGNOSIS — H35033 Hypertensive retinopathy, bilateral: Secondary | ICD-10-CM | POA: Diagnosis not present

## 2022-11-06 DIAGNOSIS — H04123 Dry eye syndrome of bilateral lacrimal glands: Secondary | ICD-10-CM | POA: Diagnosis not present

## 2022-11-09 ENCOUNTER — Other Ambulatory Visit: Payer: Self-pay | Admitting: Cardiology

## 2022-11-22 ENCOUNTER — Other Ambulatory Visit: Payer: Self-pay | Admitting: Cardiology

## 2022-12-16 DIAGNOSIS — Z1212 Encounter for screening for malignant neoplasm of rectum: Secondary | ICD-10-CM | POA: Diagnosis not present

## 2022-12-16 DIAGNOSIS — E291 Testicular hypofunction: Secondary | ICD-10-CM | POA: Diagnosis not present

## 2022-12-16 DIAGNOSIS — R7989 Other specified abnormal findings of blood chemistry: Secondary | ICD-10-CM | POA: Diagnosis not present

## 2022-12-16 DIAGNOSIS — Z125 Encounter for screening for malignant neoplasm of prostate: Secondary | ICD-10-CM | POA: Diagnosis not present

## 2022-12-16 DIAGNOSIS — E785 Hyperlipidemia, unspecified: Secondary | ICD-10-CM | POA: Diagnosis not present

## 2022-12-16 DIAGNOSIS — N529 Male erectile dysfunction, unspecified: Secondary | ICD-10-CM | POA: Diagnosis not present

## 2022-12-16 DIAGNOSIS — I10 Essential (primary) hypertension: Secondary | ICD-10-CM | POA: Diagnosis not present

## 2022-12-16 DIAGNOSIS — R739 Hyperglycemia, unspecified: Secondary | ICD-10-CM | POA: Diagnosis not present

## 2022-12-23 DIAGNOSIS — E291 Testicular hypofunction: Secondary | ICD-10-CM | POA: Diagnosis not present

## 2022-12-23 DIAGNOSIS — G4733 Obstructive sleep apnea (adult) (pediatric): Secondary | ICD-10-CM | POA: Diagnosis not present

## 2022-12-23 DIAGNOSIS — Z0000 Encounter for general adult medical examination without abnormal findings: Secondary | ICD-10-CM | POA: Diagnosis not present

## 2022-12-23 DIAGNOSIS — M5442 Lumbago with sciatica, left side: Secondary | ICD-10-CM | POA: Diagnosis not present

## 2022-12-23 DIAGNOSIS — Z1339 Encounter for screening examination for other mental health and behavioral disorders: Secondary | ICD-10-CM | POA: Diagnosis not present

## 2022-12-23 DIAGNOSIS — Z87891 Personal history of nicotine dependence: Secondary | ICD-10-CM | POA: Diagnosis not present

## 2022-12-23 DIAGNOSIS — Z1331 Encounter for screening for depression: Secondary | ICD-10-CM | POA: Diagnosis not present

## 2022-12-23 DIAGNOSIS — E785 Hyperlipidemia, unspecified: Secondary | ICD-10-CM | POA: Diagnosis not present

## 2022-12-23 DIAGNOSIS — E1169 Type 2 diabetes mellitus with other specified complication: Secondary | ICD-10-CM | POA: Diagnosis not present

## 2022-12-23 DIAGNOSIS — R82998 Other abnormal findings in urine: Secondary | ICD-10-CM | POA: Diagnosis not present

## 2022-12-23 DIAGNOSIS — R739 Hyperglycemia, unspecified: Secondary | ICD-10-CM | POA: Diagnosis not present

## 2022-12-23 DIAGNOSIS — I10 Essential (primary) hypertension: Secondary | ICD-10-CM | POA: Diagnosis not present

## 2022-12-23 DIAGNOSIS — B351 Tinea unguium: Secondary | ICD-10-CM | POA: Diagnosis not present

## 2023-01-29 ENCOUNTER — Other Ambulatory Visit: Payer: Self-pay | Admitting: Cardiology

## 2023-02-10 ENCOUNTER — Other Ambulatory Visit: Payer: Self-pay | Admitting: Cardiology

## 2023-03-23 ENCOUNTER — Other Ambulatory Visit: Payer: Self-pay | Admitting: Cardiology

## 2023-04-27 DIAGNOSIS — Z23 Encounter for immunization: Secondary | ICD-10-CM | POA: Diagnosis not present

## 2023-05-08 ENCOUNTER — Encounter: Payer: Self-pay | Admitting: Adult Health

## 2023-05-08 MED ORDER — MODAFINIL 200 MG PO TABS: ORAL_TABLET | ORAL | 0 refills | Status: DC

## 2023-05-08 NOTE — Telephone Encounter (Signed)
Last visit 07/16/22 Next visit 07/15/23 Per registry on epic, last filled:

## 2023-05-22 DIAGNOSIS — Z87891 Personal history of nicotine dependence: Secondary | ICD-10-CM | POA: Diagnosis not present

## 2023-05-22 DIAGNOSIS — G4733 Obstructive sleep apnea (adult) (pediatric): Secondary | ICD-10-CM | POA: Diagnosis not present

## 2023-05-22 DIAGNOSIS — Z1389 Encounter for screening for other disorder: Secondary | ICD-10-CM | POA: Diagnosis not present

## 2023-05-22 DIAGNOSIS — E1169 Type 2 diabetes mellitus with other specified complication: Secondary | ICD-10-CM | POA: Diagnosis not present

## 2023-05-22 DIAGNOSIS — Z0000 Encounter for general adult medical examination without abnormal findings: Secondary | ICD-10-CM | POA: Diagnosis not present

## 2023-05-22 DIAGNOSIS — R739 Hyperglycemia, unspecified: Secondary | ICD-10-CM | POA: Diagnosis not present

## 2023-05-22 DIAGNOSIS — M5442 Lumbago with sciatica, left side: Secondary | ICD-10-CM | POA: Diagnosis not present

## 2023-05-22 DIAGNOSIS — R531 Weakness: Secondary | ICD-10-CM | POA: Diagnosis not present

## 2023-05-22 DIAGNOSIS — R3589 Other polyuria: Secondary | ICD-10-CM | POA: Diagnosis not present

## 2023-05-22 DIAGNOSIS — I10 Essential (primary) hypertension: Secondary | ICD-10-CM | POA: Diagnosis not present

## 2023-05-22 DIAGNOSIS — R6889 Other general symptoms and signs: Secondary | ICD-10-CM | POA: Diagnosis not present

## 2023-05-29 DIAGNOSIS — Z87891 Personal history of nicotine dependence: Secondary | ICD-10-CM | POA: Diagnosis not present

## 2023-07-07 DIAGNOSIS — M1711 Unilateral primary osteoarthritis, right knee: Secondary | ICD-10-CM | POA: Diagnosis not present

## 2023-07-07 DIAGNOSIS — M25561 Pain in right knee: Secondary | ICD-10-CM | POA: Diagnosis not present

## 2023-07-14 ENCOUNTER — Ambulatory Visit (INDEPENDENT_AMBULATORY_CARE_PROVIDER_SITE_OTHER): Payer: Medicare Other | Admitting: Physician Assistant

## 2023-07-14 ENCOUNTER — Encounter: Payer: Self-pay | Admitting: *Deleted

## 2023-07-14 ENCOUNTER — Ambulatory Visit: Payer: Medicare Other | Admitting: Physician Assistant

## 2023-07-14 ENCOUNTER — Other Ambulatory Visit (INDEPENDENT_AMBULATORY_CARE_PROVIDER_SITE_OTHER): Payer: Self-pay

## 2023-07-14 ENCOUNTER — Encounter: Payer: Self-pay | Admitting: Physician Assistant

## 2023-07-14 DIAGNOSIS — G8929 Other chronic pain: Secondary | ICD-10-CM

## 2023-07-14 DIAGNOSIS — M25561 Pain in right knee: Secondary | ICD-10-CM

## 2023-07-14 NOTE — Progress Notes (Unsigned)
PATIENT: Alexander [name] DOB: September 08, 1950  REASON FOR VISIT: follow up HISTORY FROM: patient   Virtual Visit via Video Note  I connected with Alexander [name] on 07/15/23 at  1:00 PM EST by a video enabled telemedicine application located remotely at [facility] and verified that I am speaking with the correct person using two identifiers who was located at their own home.   I discussed the limitations of evaluation and management by telemedicine and the availability of in person appointments. The patient expressed understanding and agreed to proceed.   PATIENT: Alexander [name] DOB: 05-12-1951  REASON FOR VISIT: follow up HISTORY FROM: patient  HISTORY OF PRESENT ILLNESS: Today 07/15/23:  Alexander [name] is a 72 y.o. male with a history of OSA on CPAP and narcolepsy. Returns today for follow-up.  He reports that the CPAP is working well.  He denies any new issues.  He continues to use Provigil for narcolepsy however he states that he is finding that he is falling asleep during church and during conversations.  Reports that he does not have any trouble staying awake while driving.  He has only been on Provigil for narcolepsy.  His download for CPAP is below     07/16/22: Alexander [name] is a 72 year old male with a history of obstructive sleep apnea on CPAP.  He returns today for follow-up.  His download is below.  He denies any new issues.  Reports that the CPAP continues to work well for him.  Continues on modafinil 200 mg daily.  Denies falling asleep while driving.  He states that if he sits still long enough without any engagement he may doze off.    07/14/21: Alexander [name] is a 72 year old male with a history of narcolepsy and obstructive sleep apnea on CPAP.  His download is attached.  He reports that CPAP is working well for him.  He denies any new issues.  Continues on modafinil 200 mg daily.   HISTORY 07/16/20:   Alexander [name] is a 72 year old  male with a history of narcolepsy and obstructive sleep apnea on CPAP.  His download indicates that he uses machine nightly for compliance of 100%.  He uses machine greater than 4 hours 28 days for compliance of 93%.  On average he uses his machine 5 hours and 53 minutes.  His residual AHI is 3.5 on 512 cm of water with EPR of 2.  Leak in the 95th percentile is 18.2 L/min.  Reports that the CPAP is working well for him.  He denies any new issues.   He remains on modafinil 200 mg daily.  Reports that this continues to work well for him.  Reports that when he sneezes he feels a slight loss of muscle tone but does not fall to the ground.  Continues to operate a motor vehicle.  No issues falling asleep while driving.  He returns today for an evaluation.  REVIEW OF SYSTEMS: Out of a complete 14 system review of symptoms, the patient complains only of the following symptoms, and all other reviewed systems are negative.  ALLERGIES: No Known Allergies  HOME MEDICATIONS: Outpatient Medications Prior to Visit  Medication Sig Dispense Refill   aspirin EC 81 MG tablet Take 81 mg by mouth daily. Swallow whole.     Calcium Carb-Cholecalciferol (CALCIUM 600/VITAMIN D PO) Take 1 tablet by mouth daily.     carvedilol (COREG) 12.5 MG tablet Take 1 tablet (12.5 mg total) by mouth 2 (two)  times daily with a meal. 30 tablet 0   losartan-hydrochlorothiazide (HYZAAR) 100-25 MG tablet Take 1 tablet by mouth daily. 30 tablet 0   MAGNESIUM PO Take 250 mg by mouth daily as needed (2 tablets by mouth daily).     metFORMIN (GLUCOPHAGE-XR) 500 MG 24 hr tablet Take 500 mg by mouth daily with supper.     methocarbamol (ROBAXIN) 500 MG tablet Take 1 tablet (500 mg total) by mouth every 6 (six) hours as needed. (Patient not taking: Reported on 07/14/2023) 40 tablet 1   modafinil (PROVIGIL) 200 MG tablet TAKE 1 TABLET(200 MG) BY MOUTH DAILY 60 tablet 0   Multiple Vitamin (MULTIVITAMIN WITH MINERALS) TABS tablet Take 1 tablet by  mouth daily.     Rosuvastatin Calcium 20 MG CPSP 1 tablet     sertraline (ZOLOFT) 50 MG tablet Take 50 mg by mouth at bedtime.     simvastatin (ZOCOR) 20 MG tablet Take 1 tablet (20 mg total) by mouth at bedtime. (Patient not taking: Reported on 07/14/2023) 90 tablet 3   Testosterone 30 MG/ACT SOLN 1 Pump daily. Apply under the arms     topiramate (TOPAMAX) 25 MG tablet Take 12.5 mg by mouth at bedtime. (Patient not taking: Reported on 07/14/2023)  12   vitamin B-12 (CYANOCOBALAMIN) 1000 MCG tablet Take 3,000 mcg by mouth daily.     No facility-administered medications prior to visit.    PAST MEDICAL HISTORY: Past Medical History:  Diagnosis Date   Allergy    Cancer (HCC)    skin cancer to upper chest/basal cell 2007   Chronic neck pain    DJD   Chronic pain in right foot    DDD (degenerative disc disease), lumbar    Dyslipidemia    Erectile dysfunction    Exogenous obesity    GERD (gastroesophageal reflux disease)    H/O acquired cardiomyopathy 1996   RESOLVED (apparently was thought to have had an MI, but did not have any intervention).  He developed cardiomyopathy that forced him to retire from [employer]..  The current EF 60-65%.   History of fracture due to fall 1988   Bilateral wrist fractures   Hyperlipidemia    Hypertension    Hypogonadism male    IBS (irritable bowel syndrome)    Obstructive sleep apnea    cpap since 1999   Pre-diabetes    on Metformin DAily with supper   Sleep apnea    CPAP nightly    PAST SURGICAL HISTORY: Past Surgical History:  Procedure Laterality Date   CARDIAC CATHETERIZATION  2000   Nonobstructive disease.  Persistently reduced EF   CARPAL TUNNEL RELEASE Left    CARPAL TUNNEL RELEASE Right 01/05/2019   Procedure: RIGHT CARPAL TUNNEL RELEASE;  Surgeon: Betha Loa, MD;  Location: [facility];  Service: Orthopedics;  Laterality: Right;  Bier block   COLONOSCOPY     CORONARY CT ANGIOGRAM  07/2017   Coronary calcium  score 0?.  Focal noncalcific plaque (30%) in proximal LAD.  Otherwise no significant CAD.  Normal aortic root (3.2 cm)   HAMMER TOE SURGERY Bilateral    ingrown toenail surgery Bilateral    2017   IR RADIOLOGY PERIPHERAL GUIDED IV START  07/25/2017   IR US GUIDE VASC ACCESS RIGHT  07/25/2017   MENISCUS REPAIR Right    2016   ORIF WRIST FRACTURE Bilateral    PLANTAR FASCIA RELEASE Right    POLYPECTOMY     SHOULDER ARTHROSCOPY Left  TENDON REPAIR Right    right quad tendon repair   Tilt Table Test     Noted to have vasovagal syncope   TOTAL HIP ARTHROPLASTY Left 12/08/2020   Procedure: LEFT TOTAL HIP ARTHROPLASTY ANTERIOR APPROACH;  Surgeon: Kathryne Hitch, MD;  Location: [facility];  Service: Orthopedics;  Laterality: Left;  Needs RNFA   TOTAL HIP ARTHROPLASTY Right 03/30/2021   Procedure: RIGHT TOTAL HIP ARTHROPLASTY ANTERIOR APPROACH;  Surgeon: Kathryne Hitch, MD;  Location: [facility];  Service: Orthopedics;  Laterality: Right;   TRANSTHORACIC ECHOCARDIOGRAM  06/21/2015   Normal LV function.  EF 57%.  Mild left atrial enlargement, trace AI, trace MR, mild PI.   TRANSTHORACIC ECHOCARDIOGRAM  06/2017   EF 60-65%.  Mild diastolic dysfunction.  No significant valvular abnormality.  Mild aortic calcification.   WISDOM TOOTH EXTRACTION Bilateral     FAMILY HISTORY: Family History  Problem Relation Age of Onset   Cancer Mother        duodenal CA   Heart disease Mother    Depression Mother    Stomach cancer Mother    Cancer Father    Hypertension Father    High Cholesterol Father    Heart disease Father    Diabetes Father    Hypertension Sister    High Cholesterol Sister    Heart disease Sister    Esophageal cancer Neg Hx    Rectal cancer Neg Hx    Colon cancer Neg Hx    Colon polyps Neg Hx     SOCIAL HISTORY: Social History   Socioeconomic History   Marital status: Married    Spouse name: Not on file   Number of children: Not on file   Years of  education: Not on file   Highest education level: Not on file  Occupational History   Not on file  Tobacco Use   Smoking status: Former    Current packs/day: 0.00    Types: Cigarettes    Quit date: 1996    Years since quitting: 28.9   Smokeless tobacco: Never  Vaping Use   Vaping status: Never Used  Substance and Sexual Activity   Alcohol use: Yes    Alcohol/week: 2.0 standard drinks of alcohol    Types: 2 Standard drinks or equivalent per week    Comment: social   Drug use: No   Sexual activity: Not on file  Other Topics Concern   Not on file  Social History Narrative   He has been married for 36 years.     Son (born 84) -lives in [place]   Daughter (born 18) also lives in [place].   Retired from [employer].  Enjoys doing house remodeling.   Remote history of smoking, stopped in 1996.   Minimal alcohol use.   Social Determinants of Health   Financial Resource Strain: Not on file  Food Insecurity: Not on file  Transportation Needs: Not on file  Physical Activity: Not on file  Stress: Not on file  Social Connections: Not on file  Intimate Partner Violence: Not on file      PHYSICAL EXAM Generalized: Well developed, in no acute distress   Neurological examination  Mentation: Alert oriented to time, place, history taking. Follows all commands speech and language fluent Cranial nerve II-XII:Extraocular movements were full. Facial symmetry noted. Head turning and shoulder shrug  were normal and symmetric.  DIAGNOSTIC DATA (LABS, IMAGING, TESTING) - I reviewed patient records, labs, notes, testing and imaging myself where available.  Lab Results  Component Value Date   WBC 8.1 04/01/2021   HGB 12.2 (L) 04/01/2021   HCT 37.4 (L) 04/01/2021   MCV 93.0 04/01/2021   PLT 155 04/01/2021      Component Value Date/Time   NA 139 03/31/2021 0341   NA 145 (H) 06/26/2017 1049   K 3.5 03/31/2021 0341   CL 103 03/31/2021 0341   CO2 28 03/31/2021 0341   GLUCOSE  131 (H) 03/31/2021 0341   BUN 14 03/31/2021 0341   BUN 16 06/26/2017 1049   CREATININE 0.91 03/31/2021 0341   CALCIUM 8.5 (L) 03/31/2021 0341   GFRNONAA >60 03/31/2021 0341   GFRAA >60 01/01/2019 1224   No results found for: "CHOL", "HDL", "LDLCALC", "LDLDIRECT", "TRIG", "CHOLHDL" Lab Results  Component Value Date   HGBA1C 5.9 (H) 03/19/2021   No results found for: "VITAMINB12" No results found for: "TSH"    ASSESSMENT AND PLAN 72 y.o. year old male  has a past medical history of Allergy, Cancer (HCC), Chronic neck pain, Chronic pain in right foot, DDD (degenerative disc disease), lumbar, Dyslipidemia, Erectile dysfunction, Exogenous obesity, GERD (gastroesophageal reflux disease), H/O acquired cardiomyopathy (1996), History of fracture due to fall (1988), Hyperlipidemia, Hypertension, Hypogonadism male, IBS (irritable bowel syndrome), Obstructive sleep apnea, Pre-diabetes, and Sleep apnea. here with:  OSA on CPAP  CPAP compliance excellent Residual AHI is good Encouraged patient to continue using CPAP nightly and > 4 hours each night  Narcolepsy Stop Provigil Start Nuvigil 150 mg daily.  Did advise that this was a lower dose may need to increase the dose to get benefit.  Provided information on his after visit summary about this medication.  F/U in 1 year or sooner if needed    Butch Penny, MSN, NP-C 07/15/2023, 12:54 PM [facility] [address], [address] [place], [place] [address] [phone]

## 2023-07-14 NOTE — Progress Notes (Signed)
Office Visit Note   Patient: Alexander [name]           Date of Birth: 1951/02/21           MRN: [id number] Visit Date: 07/14/2023              Requested by: Garlan Fillers, MD [address] [place],  [place] [address] PCP: Garlan Fillers, MD   Assessment & Plan: Visit Diagnoses:  1. Chronic pain of right knee     Plan:  Given the fact that patient's pain continues despite conservative measures and the fact that interferes with his life he would like to proceed with a right total knee arthroplasty.  Risk benefits of surgery discussed.  Postop protocol discussed.  Risk include but are not limited to nerve/vessel injury, DVT/PE, wound healing problems, infection, blood loss prolonged pain and worsening pain.  Questions were encouraged and answered by Dr. Magnus Ivan and myself.  Knee model was shown.  Follow-Up Instructions: Return for post op.   Orders:  Orders Placed This Encounter  Procedures   XR Knee 1-2 Views Right   No orders of the defined types were placed in this encounter.     Procedures: No procedures performed   Clinical Data: No additional findings.   Subjective: Chief Complaint  Patient presents with   Right Knee - Pain    HPI HPI: Mr. Sirignano comes in today with right knee pain skin worse over the last 9 months.  He states that he has had pain in the knee for years.  History of quad tendon repair right knee 8 years ago.  States the knee swells.  It decreases his activity.  He is unable to do yard work for any length of time including riding lawnmower which she can only ride for 30 minutes due to knee pain.  Knee pain does awaken him.  He continues to work on quad strengthening both knees despite takes occasional Aleve and Tylenol but likes to stay away from medications.  He has diabetic with hemoglobin A1c of 5.7.  Notes that the knee is 10 out of 10 pain at worst and it does buckle on him at times.  History of bilateral hip replacements without  complication.  Review of Systems Denies any fevers or chills  Objective: Vital Signs: There were no vitals taken for this visit.  Physical Exam Constitutional:      Appearance: He is not ill-appearing or diaphoretic.  Pulmonary:     Effort: Pulmonary effort is normal.  Neurological:     Mental Status: He is alert and oriented to person, place, and time.  Psychiatric:        Mood and Affect: Mood normal.     Ortho Exam Bilateral knees: Good range of motion of both knees.  Tenderness medial joint line right knee.  No instability with valgus varus stressing of either knee.  Excellent quad strength bilateral knees.  Patellofemoral crepitus right knee.  Well-healed surgical incision right knee.  Right knee was prepped with Betadine and then 13 cc of bloody aspirate was aspirated from the superior lateral approach.  Patient tolerated well.  Specialty Comments:  No specialty comments available.  Imaging: XR Knee 1-2 Views Right  Result Date: 07/14/2023 Right knee 2 views: Near bone-on-bone medial compartment.  Moderate patellofemoral arthritic changes with osteophyte off the superior pole of the patella.  Mild lateral compartmental periarticular spurring.  No acute fractures.  Knee is well located.    PMFS History:  Patient Active Problem List   Diagnosis Date Noted   Status post total replacement of right hip 03/30/2021   Status post left hip replacement 12/08/2020   Unilateral primary osteoarthritis, left hip 10/16/2020   Unilateral primary osteoarthritis, right hip 10/16/2020   Hyperlipidemia due to dietary fat intake 10/01/2019   Morbid obesity (HCC) 10/01/2019   Excessive daytime sleepiness 03/03/2018   Obstructive sleep apnea treated with bilevel positive airway pressure (BiPAP) 03/03/2018   Sleep paralysis, recurrent isolated 03/03/2018   Cataplexy 03/03/2018   Abnormal dreams 03/03/2018   Breathholding 03/03/2018   Hypersomnia, persistent 09/03/2017   Exertional  dyspnea 06/20/2017   Vasovagal syncope 06/20/2017   Essential hypertension 06/20/2017   History of cardiomyopathy 06/20/2017   Past Medical History:  Diagnosis Date   Allergy    Cancer (HCC)    skin cancer to upper chest/basal cell 2007   Chronic neck pain    DJD   Chronic pain in right foot    DDD (degenerative disc disease), lumbar    Dyslipidemia    Erectile dysfunction    Exogenous obesity    GERD (gastroesophageal reflux disease)    H/O acquired cardiomyopathy 1996   RESOLVED (apparently was thought to have had an MI, but did not have any intervention).  He developed cardiomyopathy that forced him to retire from [employer]..  The current EF 60-65%.   History of fracture due to fall 1988   Bilateral wrist fractures   Hyperlipidemia    Hypertension    Hypogonadism male    IBS (irritable bowel syndrome)    Obstructive sleep apnea    cpap since 1999   Pre-diabetes    on Metformin DAily with supper   Sleep apnea    CPAP nightly    Family History  Problem Relation Age of Onset   Cancer Mother        duodenal CA   Heart disease Mother    Depression Mother    Stomach cancer Mother    Cancer Father    Hypertension Father    High Cholesterol Father    Heart disease Father    Diabetes Father    Hypertension Sister    High Cholesterol Sister    Heart disease Sister    Esophageal cancer Neg Hx    Rectal cancer Neg Hx    Colon cancer Neg Hx    Colon polyps Neg Hx     Past Surgical History:  Procedure Laterality Date   CARDIAC CATHETERIZATION  2000   Nonobstructive disease.  Persistently reduced EF   CARPAL TUNNEL RELEASE Left    CARPAL TUNNEL RELEASE Right 01/05/2019   Procedure: RIGHT CARPAL TUNNEL RELEASE;  Surgeon: Betha Loa, MD;  Location: [facility];  Service: Orthopedics;  Laterality: Right;  Bier block   COLONOSCOPY     CORONARY CT ANGIOGRAM  07/2017   Coronary calcium score 0?.  Focal noncalcific plaque (30%) in proximal LAD.   Otherwise no significant CAD.  Normal aortic root (3.2 cm)   HAMMER TOE SURGERY Bilateral    ingrown toenail surgery Bilateral    2017   IR RADIOLOGY PERIPHERAL GUIDED IV START  07/25/2017   IR US GUIDE VASC ACCESS RIGHT  07/25/2017   MENISCUS REPAIR Right    2016   ORIF WRIST FRACTURE Bilateral    PLANTAR FASCIA RELEASE Right    POLYPECTOMY     SHOULDER ARTHROSCOPY Left    TENDON REPAIR Right    right quad tendon  repair   Tilt Table Test     Noted to have vasovagal syncope   TOTAL HIP ARTHROPLASTY Left 12/08/2020   Procedure: LEFT TOTAL HIP ARTHROPLASTY ANTERIOR APPROACH;  Surgeon: Kathryne Hitch, MD;  Location: [facility];  Service: Orthopedics;  Laterality: Left;  Needs RNFA   TOTAL HIP ARTHROPLASTY Right 03/30/2021   Procedure: RIGHT TOTAL HIP ARTHROPLASTY ANTERIOR APPROACH;  Surgeon: Kathryne Hitch, MD;  Location: [facility];  Service: Orthopedics;  Laterality: Right;   TRANSTHORACIC ECHOCARDIOGRAM  06/21/2015   Normal LV function.  EF 57%.  Mild left atrial enlargement, trace AI, trace MR, mild PI.   TRANSTHORACIC ECHOCARDIOGRAM  06/2017   EF 60-65%.  Mild diastolic dysfunction.  No significant valvular abnormality.  Mild aortic calcification.   WISDOM TOOTH EXTRACTION Bilateral    Social History   Occupational History   Not on file  Tobacco Use   Smoking status: Former    Current packs/day: 0.00    Types: Cigarettes    Quit date: 1996    Years since quitting: 28.9   Smokeless tobacco: Never  Vaping Use   Vaping status: Never Used  Substance and Sexual Activity   Alcohol use: Yes    Alcohol/week: 2.0 standard drinks of alcohol    Types: 2 Standard drinks or equivalent per week    Comment: social   Drug use: No   Sexual activity: Not on file

## 2023-07-15 ENCOUNTER — Telehealth: Payer: Medicare Other | Admitting: Adult Health

## 2023-07-15 DIAGNOSIS — G47419 Narcolepsy without cataplexy: Secondary | ICD-10-CM

## 2023-07-15 DIAGNOSIS — G47411 Narcolepsy with cataplexy: Secondary | ICD-10-CM

## 2023-07-15 DIAGNOSIS — G4733 Obstructive sleep apnea (adult) (pediatric): Secondary | ICD-10-CM | POA: Diagnosis not present

## 2023-07-15 MED ORDER — ARMODAFINIL 150 MG PO TABS: 150.0000 mg | ORAL_TABLET | Freq: Every day | ORAL | 5 refills | Status: DC

## 2023-07-15 MED ORDER — MODAFINIL 200 MG PO TABS: ORAL_TABLET | ORAL | 1 refills | Status: DC

## 2023-07-15 NOTE — Patient Instructions (Signed)
Continue using CPAP nightly and greater than 4 hours each night Stop Provigil Start Nuvigil 150 mg daily.  If you are not finding this beneficial please contact our office so we can increase the dose.    If your symptoms worsen or you develop new symptoms please let us know.

## 2023-07-15 NOTE — Progress Notes (Signed)
Spoke with [facility] and canceled Modafinil prescription.

## 2023-07-21 ENCOUNTER — Ambulatory Visit: Payer: Medicare Other | Admitting: Physician Assistant

## 2023-07-22 DIAGNOSIS — L570 Actinic keratosis: Secondary | ICD-10-CM | POA: Diagnosis not present

## 2023-07-22 DIAGNOSIS — D235 Other benign neoplasm of skin of trunk: Secondary | ICD-10-CM | POA: Diagnosis not present

## 2023-07-22 DIAGNOSIS — L814 Other melanin hyperpigmentation: Secondary | ICD-10-CM | POA: Diagnosis not present

## 2023-07-22 DIAGNOSIS — Z85828 Personal history of other malignant neoplasm of skin: Secondary | ICD-10-CM | POA: Diagnosis not present

## 2023-07-22 DIAGNOSIS — D1801 Hemangioma of skin and subcutaneous tissue: Secondary | ICD-10-CM | POA: Diagnosis not present

## 2023-07-22 DIAGNOSIS — L820 Inflamed seborrheic keratosis: Secondary | ICD-10-CM | POA: Diagnosis not present

## 2023-07-22 DIAGNOSIS — D0461 Carcinoma in situ of skin of right upper limb, including shoulder: Secondary | ICD-10-CM | POA: Diagnosis not present

## 2023-07-22 DIAGNOSIS — L821 Other seborrheic keratosis: Secondary | ICD-10-CM | POA: Diagnosis not present

## 2023-08-01 ENCOUNTER — Other Ambulatory Visit: Payer: Self-pay

## 2023-08-04 ENCOUNTER — Encounter: Payer: Self-pay | Admitting: Adult Health

## 2023-08-05 MED ORDER — ARMODAFINIL 150 MG PO TABS: 150.0000 mg | ORAL_TABLET | Freq: Every day | ORAL | 1 refills | Status: DC

## 2023-08-06 ENCOUNTER — Ambulatory Visit (INDEPENDENT_AMBULATORY_CARE_PROVIDER_SITE_OTHER): Payer: Medicare Other | Admitting: Neurology

## 2023-08-06 DIAGNOSIS — G4733 Obstructive sleep apnea (adult) (pediatric): Secondary | ICD-10-CM | POA: Diagnosis not present

## 2023-08-06 DIAGNOSIS — G4753 Recurrent isolated sleep paralysis: Secondary | ICD-10-CM

## 2023-08-06 DIAGNOSIS — G4719 Other hypersomnia: Secondary | ICD-10-CM

## 2023-08-06 DIAGNOSIS — G47411 Narcolepsy with cataplexy: Secondary | ICD-10-CM

## 2023-08-07 NOTE — Progress Notes (Signed)
Sent message, via epic in basket, requesting orders in epic from surgeon.  

## 2023-08-07 NOTE — Progress Notes (Signed)
[facility]  Alexander [name] 72 year old male 12-24-1950   HOME SLEEP TEST REPORT ( by Watch PAT)   STUDY DATE: 08-07-2023  ( mail out test data load)    ORDERING CLINICIAN: Ethelene Browns, NP  REFERRING CLINICIAN: PCP    CLINICAL INFORMATION/HISTORY: 07/15/23:MM,NP Video visit   Alexander [name] is a 72 y.o. male with a history of OSA on CPAP and narcolepsy. Returns today for follow-up.  He reports that the CPAP is working well on 5-12 cm water, 2 cm EPR, 95% pressure of 11 cm water. The patient is in need of a new machine,  he continues to use Provigil for narcolepsy however he states that he is finding that he is falling asleep during church and during conversations.  Reports that he does not have any trouble staying awake while driving.  He has only been on Provigil for narcolepsy.  His download for CPAP is below. He remains sleepy , is on Modafinil.      Epworth sleepiness score: 17 /24.   BMI: x kg/m   Neck Circumference: x   FINDINGS:   Sleep Summary:   Total Recording Time (hours, min):     6 hours 29 minutes   Total Sleep Time (hours, min):   5 hours 34 minutes              Percent REM (%):   12.7%                                     Respiratory Indices by AASM criteria:   Calculated pAHI (per hour):   52/h      with 11% central events.  Following CMS criteria the AHI is still severe at 45.3/h.                    REM pAHI:     47.7/h                                            NREM pAHI:   52.7/h                           Positional AHI: The vast majority of the sleep time was recorded in supine 330 minutes in total with an AHI of 51.5/h following AASM criteria and 35.3/h following CMS criteria.  Snoring level reached a mean volume of 43 dB and snoring was present for two thirds of the total recorded sleep time.                                                  Oxygen Saturation Statistics:   Oxygen Saturation (%) Mean:    94%            O2 Saturation Range (%):   Between 82 and a maximum of 99%                                    O2 Saturation (minutes) <89%:  10.3 minutes       Pulse Rate Statistics:   Pulse Mean (bpm):    61 bpm             Pulse Range:     Between 33 and 113 bpm.            IMPRESSION:  This HST confirms the presence of severe and complex sleep apnea but dominantly obstructive apnea.  This apnea is not REM sleep dependent. There is intermittent bradycardia noted and mild hypoxemia.  A continuation of positive airway pressure therapy is indicated.   RECOMMENDATION: The new also titration capable CPAP device by [employer] will be set between 5 and 15 cm water with 2 cm EPR, heated humidification and an interface to be fitted.  Please note facial hair.     INTERPRETING PHYSICIAN:   Melvyn Novas, MD  Certified in Neurology by ABPN Certified in Sleep Medicine by [facility] [address] [place], [place] [address]

## 2023-08-10 ENCOUNTER — Other Ambulatory Visit: Payer: Self-pay | Admitting: Neurology

## 2023-08-10 DIAGNOSIS — G4733 Obstructive sleep apnea (adult) (pediatric): Secondary | ICD-10-CM

## 2023-08-10 DIAGNOSIS — G47411 Narcolepsy with cataplexy: Secondary | ICD-10-CM

## 2023-08-10 NOTE — Procedures (Signed)
[facility]  [name] 72 year old male 22-Jan-1951   HOME SLEEP TEST REPORT ( by Watch PAT)   STUDY DATE: 08-07-2023  ( mail out test data load)    ORDERING CLINICIAN: Ethelene Browns, NP  REFERRING CLINICIAN: PCP    CLINICAL INFORMATION/HISTORY: 07/15/23:MM,NP Video visit   Alexander [name] is a 72 y.o. male with a history of OSA on CPAP and narcolepsy. Returns today for follow-up.  He reports that the CPAP is working well on 5-12 cm water, 2 cm EPR, 95% pressure of 11 cm water. The patient is in need of a new machine,  he continues to use Provigil for narcolepsy however he states that he is finding that he is falling asleep during church and during conversations.  Reports that he does not have any trouble staying awake while driving.  He has only been on Provigil for narcolepsy.  His download for CPAP is below. He remains sleepy , is on Modafinil.      Epworth sleepiness score: 17 /24.   BMI: x kg/m   Neck Circumference: x   FINDINGS:   Sleep Summary:   Total Recording Time (hours, min):     6 hours 29 minutes   Total Sleep Time (hours, min):   5 hours 34 minutes              Percent REM (%):   12.7%                                     Respiratory Indices by AASM criteria:   Calculated pAHI (per hour):   52/h      with 11% central events.  Following CMS criteria the AHI is still severe at 45.3/h.                    REM pAHI:     47.7/h                                            NREM pAHI:   52.7/h                           Positional AHI: The vast majority of the sleep time was recorded in supine 330 minutes in total with an AHI of 51.5/h following AASM criteria and 35.3/h following CMS criteria.  Snoring level reached a mean volume of 43 dB and snoring was present for two thirds of the total recorded sleep time.                                                  Oxygen Saturation Statistics:   Oxygen Saturation (%) Mean:    94%           O2  Saturation Range (%):   Between 82 and a maximum of 99%                                    O2 Saturation (minutes) <89%:  10.3 minutes       Pulse Rate Statistics:   Pulse Mean (bpm):    61 bpm             Pulse Range:     Between 33 and 113 bpm.            IMPRESSION:  This HST confirms the presence of severe and complex sleep apnea but dominantly obstructive apnea.  This apnea is not REM sleep dependent. There is intermittent bradycardia noted and mild hypoxemia.  A continuation of positive airway pressure therapy is indicated.   RECOMMENDATION: The new also titration capable CPAP device by [employer] will be set between 5 and 15 cm water with 2 cm EPR, heated humidification and an interface to be fitted.  Please note facial hair.     INTERPRETING PHYSICIAN:   Melvyn Novas, MD  Certified in Neurology by ABPN Certified in Sleep Medicine by [facility] [address], [address] [place], [place] [address]

## 2023-08-10 NOTE — Patient Instructions (Addendum)
SURGICAL WAITING ROOM VISITATION Patients having surgery or a procedure may have no more than 2 support people in the waiting area - these visitors may rotate in the visitor waiting room.   Due to an increase in RSV and influenza rates and associated hospitalizations, children ages 41 and under may not visit patients in [facility] hospitals. If the patient needs to stay at the hospital during part of their recovery, the visitor guidelines for inpatient rooms apply.  PRE-OP VISITATION  Pre-op nurse will coordinate an appropriate time for 1 support person to accompany the patient in pre-op.  This support person may not rotate.  This visitor will be contacted when the time is appropriate for the visitor to come back in the pre-op area.  Please refer to the [facility] website for the visitor guidelines for Inpatients (after your surgery is over and you are in a regular room).  You are not required to quarantine at this time prior to your surgery. However, you must do this: Hand Hygiene often Do NOT share personal items Notify your provider if you are in close contact with someone who has COVID or you develop fever 100.4 or greater, new onset of sneezing, cough, sore throat, shortness of breath or body aches.  If you test positive for Covid or have been in contact with anyone that has tested positive in the last 10 days please notify you surgeon.    Your procedure is scheduled on:  FRIDAY  August 22, 2023  Report to [facility] Main Entrance: [facility]sen entrance where the [employer] is available.   Report to admitting at:  11:30   AM  Call this number if you have any questions or problems the morning of surgery [phone]  Do not eat food after Midnight the night prior to your surgery/procedure.  After Midnight you may have the following liquids until  11:00 AM  DAY OF SURGERY  Clear Liquid Diet Water Black Coffee (sugar ok, NO MILK/CREAM OR CREAMERS)  Tea (sugar ok, NO  MILK/CREAM OR CREAMERS) regular and decaf                             Plain Jell-O  with no fruit (NO RED)                                           Fruit ices (not with fruit pulp, NO RED)                                     Popsicles (NO RED)                                                                  Juice: NO CITRUS JUICES: only apple, WHITE grape, WHITE cranberry Sports drinks like Gatorade or Powerade (NO RED)                    The day of surgery:  Drink ONE (1) Pre-Surgery G2 at 11:00  AM the morning of surgery.  Drink in one sitting. Do not sip.  This drink was given to you during your hospital pre-op appointment visit. Nothing else to drink after completing the Pre-Surgery G2 : No candy, chewing gum or throat lozenges.    FOLLOW ANY ADDITIONAL PRE OP INSTRUCTIONS YOU RECEIVED FROM YOUR SURGEON'S OFFICE!!!   Oral Hygiene is also important to reduce your risk of infection.        Remember - BRUSH YOUR TEETH THE MORNING OF SURGERY WITH YOUR REGULAR TOOTHPASTE  Do NOT smoke after Midnight the night before surgery.  STOP TAKING all Vitamins, Herbs and supplements 1 week before your  surgery.   Stop taking Aspirin 1 week before your sugery.   METFORMIN:  Day BEFORE surgery, you may take as usual.    DAY OF SURGERY: DO NOT TAKE METFORMIN.   Take ONLY these medicines the morning of surgery with A SIP OF WATER: Carvedilol  If You have been diagnosed with Sleep Apnea - Bring CPAP mask and tubing day of surgery. We will provide you with a CPAP machine on the day of your surgery.                   You may not have any metal on your body including  jewelry, and body piercing  Do not wear  lotions, powders, cologne, or deodorant  Men may shave face and neck.  Contacts, Hearing Aids, dentures or bridgework may not be worn into surgery. DENTURES WILL BE REMOVED PRIOR TO SURGERY PLEASE DO NOT APPLY "Poly grip" OR ADHESIVES!!!  You may bring a small overnight bag with you on  the day of surgery, only pack items that are not valuable. [facility] IS NOT RESPONSIBLE   FOR VALUABLES THAT ARE LOST OR STOLEN.   Do not bring your home medications to the hospital. The Pharmacy will dispense medications listed on your medication list to you during your admission in the Hospital.  Please read over the following fact sheets you were given: IF YOU HAVE QUESTIONS ABOUT YOUR PRE-OP INSTRUCTIONS, PLEASE CALL [phone].     Pre-operative 5 CHG Bath Instructions   You can play a key role in reducing the risk of infection after surgery. Your skin needs to be as free of germs as possible. You can reduce the number of germs on your skin by washing with CHG (chlorhexidine gluconate) soap before surgery. CHG is an antiseptic soap that kills germs and continues to kill germs even after washing.   DO NOT use if you have an allergy to chlorhexidine/CHG or antibacterial soaps. If your skin becomes reddened or irritated, stop using the CHG and notify one of our RNs at [phone]  Please shower with the CHG soap starting 4 days before surgery using the following schedule: START SHOWERS ON  Monday  August 18, 2023  Please keep in mind the following:  DO NOT shave, including legs and underarms, starting the day of your first shower.   You may shave your face at any point before/day of surgery.   Place clean sheets on your bed the day you start using CHG soap. Use a clean washcloth (not used since being washed) for each shower. DO NOT sleep with pets once you start using the CHG.   CHG Shower Instructions:  If you choose to wash your hair and private area, wash first with your normal shampoo/soap.  After you use shampoo/soap, rinse your hair and body thoroughly to remove shampoo/soap residue.  Turn the water OFF  and apply about 3 tablespoons (45 ml) of CHG soap to a CLEAN washcloth.  Apply CHG soap ONLY FROM YOUR NECK DOWN TO YOUR TOES (washing for 3-5 minutes)  DO NOT use CHG soap on face, private areas, open wounds, or sores.  Pay special attention to the area where your surgery is being performed.  If you are having back surgery, having someone wash your back for you may be helpful.  Wait 2 minutes after CHG soap is applied, then you may rinse off the CHG soap.  Pat dry with a clean towel  Put on clean clothes/pajamas   If you choose to wear lotion, please use ONLY the CHG-compatible lotions on the back of this paper.     Additional instructions for the day of surgery: DO NOT APPLY any lotions, deodorants, cologne, or perfumes.   Put on clean/comfortable clothes.  Brush your teeth.  Ask your nurse before applying any prescription medications to the skin.      CHG Compatible Lotions   Aveeno Moisturizing lotion  Cetaphil Moisturizing Cream  Cetaphil Moisturizing Lotion  Clairol Herbal Essence Moisturizing Lotion, Dry Skin  Clairol Herbal Essence Moisturizing Lotion, Extra Dry Skin  Clairol Herbal Essence Moisturizing Lotion, Normal Skin  Curel Age Defying Therapeutic Moisturizing Lotion with Alpha Hydroxy  Curel Extreme Care Body Lotion  Curel Soothing Hands Moisturizing Hand Lotion  Curel Therapeutic Moisturizing Cream, Fragrance-Free  Curel Therapeutic Moisturizing Lotion, Fragrance-Free  Curel Therapeutic Moisturizing Lotion, Original Formula  Eucerin Daily Replenishing Lotion  Eucerin Dry Skin Therapy Plus Alpha Hydroxy Crme  Eucerin Dry Skin Therapy Plus Alpha Hydroxy Lotion  Eucerin Original Crme  Eucerin Original Lotion  Eucerin Plus Crme Eucerin Plus Lotion  Eucerin TriLipid Replenishing Lotion  Keri Anti-Bacterial Hand Lotion  Keri Deep Conditioning Original Lotion Dry Skin Formula Softly Scented  Keri Deep Conditioning Original Lotion, Fragrance Free Sensitive Skin  Formula  Keri Lotion Fast Absorbing Fragrance Free Sensitive Skin Formula  Keri Lotion Fast Absorbing Softly Scented Dry Skin Formula  Keri Original Lotion  Keri Skin Renewal Lotion Keri Silky Smooth Lotion  Keri Silky Smooth Sensitive Skin Lotion  Nivea Body Creamy Conditioning Oil  Nivea Body Extra Enriched Lotion  Nivea Body Original Lotion  Nivea Body Sheer Moisturizing Lotion Nivea Crme  Nivea Skin Firming Lotion  NutraDerm 30 Skin Lotion  NutraDerm Skin Lotion  NutraDerm Therapeutic Skin Cream  NutraDerm Therapeutic Skin Lotion  ProShield Protective Hand Cream  Provon moisturizing lotion   FAILURE TO FOLLOW THESE INSTRUCTIONS MAY RESULT IN THE CANCELLATION OF YOUR SURGERY  PATIENT SIGNATURE_________________________________  NURSE SIGNATURE__________________________________  ________________________________________________________________________      Rogelia Mire    An incentive spirometer is a tool that can help keep your lungs clear and active. This tool measures how well you are filling your lungs with each breath. Taking long  deep breaths may help reverse or decrease the chance of developing breathing (pulmonary) problems (especially infection) following: A long period of time when you are unable to move or be active. BEFORE THE PROCEDURE  If the spirometer includes an indicator to show your best effort, your nurse or respiratory therapist will set it to a desired goal. If possible, sit up straight or lean slightly forward. Try not to slouch. Hold the incentive spirometer in an upright position. INSTRUCTIONS FOR USE  Sit on the edge of your bed if possible, or sit up as far as you can in bed or on a chair. Hold the incentive spirometer in an upright position. Breathe out normally. Place the mouthpiece in your mouth and seal your lips tightly around it. Breathe in slowly and as deeply as possible, raising the piston or the ball toward the top of the  column. Hold your breath for 3-5 seconds or for as long as possible. Allow the piston or ball to fall to the bottom of the column. Remove the mouthpiece from your mouth and breathe out normally. Rest for a few seconds and repeat Steps 1 through 7 at least 10 times every 1-2 hours when you are awake. Take your time and take a few normal breaths between deep breaths. The spirometer may include an indicator to show your best effort. Use the indicator as a goal to work toward during each repetition. After each set of 10 deep breaths, practice coughing to be sure your lungs are clear. If you have an incision (the cut made at the time of surgery), support your incision when coughing by placing a pillow or rolled up towels firmly against it. Once you are able to get out of bed, walk around indoors and cough well. You may stop using the incentive spirometer when instructed by your caregiver.  RISKS AND COMPLICATIONS Take your time so you do not get dizzy or light-headed. If you are in pain, you may need to take or ask for pain medication before doing incentive spirometry. It is harder to take a deep breath if you are having pain. AFTER USE Rest and breathe slowly and easily. It can be helpful to keep track of a log of your progress. Your caregiver can provide you with a simple table to help with this. If you are using the spirometer at home, follow these instructions: SEEK MEDICAL CARE IF:  You are having difficultly using the spirometer. You have trouble using the spirometer as often as instructed. Your pain medication is not giving enough relief while using the spirometer. You develop fever of 100.5 F (38.1 C) or higher.                                                                                                    SEEK IMMEDIATE MEDICAL CARE IF:  You cough up bloody sputum that had not been present before. You develop fever of 102 F (38.9 C) or greater. You develop worsening pain at or near  the incision site. MAKE SURE YOU:  Understand these instructions. Will watch your condition. Will  get help right away if you are not doing well or get worse. Document Released: 12/16/2006 Document Revised: 10/28/2011 Document Reviewed: 02/16/2007 [facility] Patient Information 2014 [place], [place].

## 2023-08-10 NOTE — Progress Notes (Signed)
This HST confirms the presence of severe and complex sleep apnea but dominantly obstructive apnea.  This apnea is not REM sleep dependent. There is intermittent bradycardia noted and mild hypoxemia.   A continuation of positive airway pressure therapy is indicated.   RECOMMENDATION: The new auto -titration capable CPAP device by [employer] will be set between 5 and 15 cm water with 2 cm EPR, heated humidification and an interface to be fitted.  Please note facial hair.

## 2023-08-10 NOTE — Progress Notes (Signed)
COVID Vaccine received:  []  No [x]  Yes Date of any COVID positive Test in last 90 days:  PCP - Jarome Matin, MD  Cardiologist - Bryan Lemma, MD  Sleep/ Neurology-  Melvyn Novas, MD  Chest x-ray - CT chest w/o contrast  03-09-2018  Epic EKG -  10-10-2021 Epic     will repeat at PST Stress Test - 10-17-2021  Epic ECHO - 10-24-2021  Epic Cardiac Cath - 2000  Epic  PCR screen: [x]  Ordered & Completed []   No Order but Needs PROFEND     []   N/A for this surgery  Surgery Plan:  []  Ambulatory   [x]  Outpatient in bed  []  Admit Anesthesia:    []  General  [x]  Spinal  []   Choice []   MAC  Pacemaker / ICD device [x]  No []  Yes   Spinal Cord Stimulator:[x]  No []  Yes       History of Sleep Apnea? []  No [x]  Yes   CPAP used?- []  No [x]  Yes    Does the patient monitor blood sugar?   []  N/A   []  No []  Yes  Patient has: []  NO Hx DM   [x]  Pre-DM   []  DM1  []   DM2 Last A1c was: 5.9  on 03-19-2021 Epic Does patient have a [employer] or Dexacom? []  No []  Yes   Fasting Blood Sugar Ranges-  Checks Blood Sugar _____ times a day  GLP1 agonist / usual dose -  GLP1 instructions:  SGLT-2 inhibitors / usual dose -  SGLT-2 instructions:   Other Diabetic medications/ instructions: Metformin  500 mg daily at supper  Blood Thinner / Instructions:  none Aspirin Instructions:  ASA 81mg   ERAS Protocol Ordered: []  No  [x]  Yes PRE-SURGERY []  ENSURE  [x]  G2    Patient is to be NPO after: 11:00  Dental hx: []  Dentures:  []  N/A      []  Bridge or Partial:                   []  Loose or Damaged teeth:   Comments: Patient was given the 5 CHG shower / bath instructions for TKA surgery along with 2 bottles of the CHG soap. Patient will start this on:  Monday 08-18-23   All questions were asked and answered, Patient voiced understanding of this process.   Activity level: Patient is able / unable to climb a flight of stairs without difficulty; []  No CP  []  No SOB, but would have ___   Patient can / can not  perform ADLs without assistance.   Anesthesia review:CM, murmur, HTN. Pre-DM, OSA- CPAP, GERD      Patient denies shortness of breath, fever, cough and chest pain at PAT appointment.  Patient verbalized understanding and agreement to the Pre-Surgical Instructions that were given to them at this PAT appointment. Patient was also educated of the need to review these PAT instructions again prior to his surgery.I reviewed the appropriate phone numbers to call if they have any and questions or concerns.

## 2023-08-11 ENCOUNTER — Other Ambulatory Visit: Payer: Self-pay

## 2023-08-11 ENCOUNTER — Encounter (HOSPITAL_COMMUNITY)
Admission: RE | Admit: 2023-08-11 | Discharge: 2023-08-11 | Disposition: A | Discharge status: Home / Self Care | Payer: Medicare Other | Source: Ambulatory Visit | Attending: Orthopaedic Surgery | Admitting: Orthopaedic Surgery

## 2023-08-11 ENCOUNTER — Encounter (HOSPITAL_COMMUNITY): Payer: Self-pay

## 2023-08-11 VITALS — BP 156/90 | HR 62 | Temp 98.1°F | Resp 14 | Ht 72.0 in | Wt 252.0 lb

## 2023-08-11 DIAGNOSIS — I429 Cardiomyopathy, unspecified: Secondary | ICD-10-CM | POA: Diagnosis not present

## 2023-08-11 DIAGNOSIS — R7303 Prediabetes: Secondary | ICD-10-CM | POA: Insufficient documentation

## 2023-08-11 DIAGNOSIS — I119 Hypertensive heart disease without heart failure: Secondary | ICD-10-CM | POA: Diagnosis not present

## 2023-08-11 DIAGNOSIS — Z87891 Personal history of nicotine dependence: Secondary | ICD-10-CM | POA: Diagnosis not present

## 2023-08-11 DIAGNOSIS — Z01818 Encounter for other preprocedural examination: Secondary | ICD-10-CM | POA: Insufficient documentation

## 2023-08-11 DIAGNOSIS — I351 Nonrheumatic aortic (valve) insufficiency: Secondary | ICD-10-CM | POA: Diagnosis not present

## 2023-08-11 DIAGNOSIS — K219 Gastro-esophageal reflux disease without esophagitis: Secondary | ICD-10-CM | POA: Diagnosis not present

## 2023-08-11 DIAGNOSIS — G4733 Obstructive sleep apnea (adult) (pediatric): Secondary | ICD-10-CM | POA: Diagnosis not present

## 2023-08-11 DIAGNOSIS — M1711 Unilateral primary osteoarthritis, right knee: Secondary | ICD-10-CM | POA: Insufficient documentation

## 2023-08-11 DIAGNOSIS — I2510 Atherosclerotic heart disease of native coronary artery without angina pectoris: Secondary | ICD-10-CM

## 2023-08-11 HISTORY — DX: Cardiac murmur, unspecified: R01.1

## 2023-08-11 HISTORY — DX: Other complications of anesthesia, initial encounter: T88.59XA

## 2023-08-11 LAB — CBC
HCT: 50.3 % (ref 39.0–52.0)
Hemoglobin: 16.9 g/dL (ref 13.0–17.0)
MCH: 30.4 pg (ref 26.0–34.0)
MCHC: 33.6 g/dL (ref 30.0–36.0)
MCV: 90.5 fL (ref 80.0–100.0)
Platelets: 210 10*3/uL (ref 150–400)
RBC: 5.56 MIL/uL (ref 4.22–5.81)
RDW: 16.1 % — ABNORMAL HIGH (ref 11.5–15.5)
WBC: 8.2 10*3/uL (ref 4.0–10.5)
nRBC: 0 % (ref 0.0–0.2)

## 2023-08-11 LAB — SURGICAL PCR SCREEN
MRSA, PCR: NEGATIVE
Staphylococcus aureus: NEGATIVE

## 2023-08-11 LAB — BASIC METABOLIC PANEL
Anion gap: 6 (ref 5–15)
BUN: 12 mg/dL (ref 8–23)
CO2: 28 mmol/L (ref 22–32)
Calcium: 9.7 mg/dL (ref 8.9–10.3)
Chloride: 103 mmol/L (ref 98–111)
Creatinine, Ser: 1 mg/dL (ref 0.61–1.24)
GFR, Estimated: >60 mL/min (ref >60)
Glucose, Bld: 97 mg/dL (ref 70–99)
Potassium: 4.1 mmol/L (ref 3.5–5.1)
Sodium: 137 mmol/L (ref 135–145)

## 2023-08-14 ENCOUNTER — Encounter (HOSPITAL_COMMUNITY): Payer: Self-pay

## 2023-08-14 NOTE — Anesthesia Preprocedure Evaluation (Signed)
Anesthesia Evaluation    Airway        Dental   Pulmonary former smoker          Cardiovascular hypertension,      Neuro/Psych    GI/Hepatic   Endo/Other    Renal/GU      Musculoskeletal   Abdominal   Peds  Hematology   Anesthesia Other Findings   Reproductive/Obstetrics                              Anesthesia Physical Anesthesia Plan  ASA:   Anesthesia Plan:    Post-op Pain Management:    Induction:   PONV Risk Score and Plan:   Airway Management Planned:   Additional Equipment:   Intra-op Plan:   Post-operative Plan:   Informed Consent:   Plan Discussed with:   Anesthesia Plan Comments: (See PAT note from 12/23 by Sherlie Ban PA-C )         Anesthesia Quick Evaluation

## 2023-08-14 NOTE — Progress Notes (Addendum)
Case: [id number] Date/Time: 08/22/23 1345   Procedure: RIGHT TOTAL KNEE ARTHROPLASTY (Right: Knee)   Anesthesia type: Spinal   Pre-op diagnosis: osteoarthritis right knee   [facility] ROOM 10 / [facility]   Surgeons: Kathryne Hitch, MD       DISCUSSION: Alexander [name] is a 72 yo male who presents to PAT prior to surgery above. PMH of former smoking, HTN, cardiomyopathy (presumed non-ischemic), heart murmur (trace AI by echo), OSA (uses CPAP), GERD, small hiatal hernia, pre-diabetes, arthritis.  Prior complication from anesthesia includes prolonged emergence during quadriceps surgery at [facility] on 07/06/2025.  Patient follows with Cardiology for hx of non-ischemic CM. Records suggest that he possibly had an MI in 1996 and cath was done (unclear results but there was no intervention). EF was reduced and he was diagnosed with NICM. He had another cath in 2000 that showed non-obstructive CAD and EF had improved. Last seen in clinic on 10/10/2021 by Dr. Herbie Baltimore. He reported mild worsening of his SOB so echo and ST were obtained. Echo showed normal EF with no significant valvular abnormality. Stress test was normal.   Patient follows with Neurology for OSA which is documented as severe. He uses a CPAP.  Follows with PCP. Last seen on 05/22/23.  Last dose of GLP-1: 08/08/23  VS: BP (!) 156/90 Comment: Right arm sitting  Pulse 62   Temp 36.7 C (Oral)   Resp 14   Ht 6' (1.829 m)   Wt 114.3 kg   SpO2 100%   BMI 34.18 kg/m   PROVIDERS: Garlan Fillers, MD Cardiology: Bryan Lemma, MD  LABS: Labs reviewed: Acceptable for surgery. (all labs ordered are listed, but only abnormal results are displayed)  Labs Reviewed  CBC - Abnormal; Notable for the following components:      Result Value   RDW 16.1 (*)    All other components within normal limits  SURGICAL PCR SCREEN  BASIC METABOLIC PANEL     IMAGES:   EKG 08/11/23;  Sinus bradycardia with occasional Premature  ventricular complexes, rate 58 Left axis deviation LVH Compared to previous EKG PVC is now present  CV: Echo 10/24/2021:  IMPRESSIONS    1. Left ventricular ejection fraction, by estimation, is 55 to 60%. Left ventricular ejection fraction by 3D volume is 58 %. The left ventricle has normal function. The left ventricle has no regional wall motion abnormalities. There is mild concentric left ventricular hypertrophy. Left ventricular diastolic parameters are consistent with Grade I diastolic dysfunction (impaired relaxation). The average left ventricular global longitudinal strain is -24.4 %. The global longitudinal strain is normal.  2. Right ventricular systolic function is normal. The right ventricular size is normal.  3. Left atrial size was moderately dilated.  4. The mitral valve is normal in structure. No evidence of mitral valve regurgitation. No evidence of mitral stenosis.  5. The aortic valve is tricuspid. Aortic valve regurgitation is trivial. No aortic stenosis is present.  6. The inferior vena cava is normal in size with greater than 50% respiratory variability, suggesting right atrial pressure of 3 mmHg.  Stress test 10/17/2021:  Attending: Overall normal functional capacity when compared to matched sedentary norms. That said, mild elevated VE/VCO2 slope is suggestive of mild circulatory limitation. Patient's functional capacity also likely reduced by his obesity and chronotropic incompetence. Would consider trial of b-blocker reduction, if clinically appropriate.  CTA coronary 07/25/2017:  IMPRESSION: 1) Calcium score 0 although some noted in proximal LAD that did not have density to  register in calculation   2) Essentially normal right dominant coronary arteries with less than 30% mixed plaque in proximal LAD   3) Normal aortic root 3.2 cm   Past Medical History:  Diagnosis Date   Allergy    Cancer (HCC)    skin cancer to upper chest/basal cell 2007    Cardiomyopathy [facility])    Chronic neck pain    DJD   Chronic pain in right foot    Complication of anesthesia    slow to wake up after quadraceps surgery at [facility].   DDD (degenerative disc disease), lumbar    Depression    Dyslipidemia    Erectile dysfunction    Exogenous obesity    GERD (gastroesophageal reflux disease)    H/O acquired cardiomyopathy 1996   RESOLVED (apparently was thought to have had an MI, but did not have any intervention).  He developed cardiomyopathy that forced him to retire from [employer]..  The current EF 60-65%.   Heart murmur    History of fracture due to fall 1988   Bilateral wrist fractures   Hyperlipidemia    Hypertension    Hypogonadism male    IBS (irritable bowel syndrome)    Obstructive sleep apnea    cpap since 1999   Pre-diabetes    on Metformin DAily with supper   Sleep apnea    CPAP nightly    Past Surgical History:  Procedure Laterality Date   CARDIAC CATHETERIZATION  2000   Nonobstructive disease.  Persistently reduced EF   CARPAL TUNNEL RELEASE Left    CARPAL TUNNEL RELEASE Right 01/05/2019   Procedure: RIGHT CARPAL TUNNEL RELEASE;  Surgeon: Betha Loa, MD;  Location: [facility];  Service: Orthopedics;  Laterality: Right;  Bier block   COLONOSCOPY     CORONARY CT ANGIOGRAM  07/2017   Coronary calcium score 0?.  Focal noncalcific plaque (30%) in proximal LAD.  Otherwise no significant CAD.  Normal aortic root (3.2 cm)   HAMMER TOE SURGERY Bilateral    ingrown toenail surgery Bilateral    2017   IR RADIOLOGY PERIPHERAL GUIDED IV START  07/25/2017   IR US GUIDE VASC ACCESS RIGHT  07/25/2017   MENISCUS REPAIR Right    2016   ORIF WRIST FRACTURE Bilateral    PLANTAR FASCIA RELEASE Right    POLYPECTOMY     SHOULDER ARTHROSCOPY Left    TENDON REPAIR Right    right quad tendon repair   Tilt Table Test     Noted to have vasovagal syncope   TOTAL HIP ARTHROPLASTY Left 12/08/2020   Procedure: LEFT TOTAL HIP  ARTHROPLASTY ANTERIOR APPROACH;  Surgeon: Kathryne Hitch, MD;  [facility];  Service: Orthopedics;  Laterality: Left;  Needs RNFA   TOTAL HIP ARTHROPLASTY Right 03/30/2021   Procedure: RIGHT TOTAL HIP ARTHROPLASTY ANTERIOR APPROACH;  Surgeon: Kathryne Hitch, MD;  [facility];  Service: Orthopedics;  Laterality: Right;   TRANSTHORACIC ECHOCARDIOGRAM  06/21/2015   Normal LV function.  EF 57%.  Mild left atrial enlargement, trace AI, trace MR, mild PI.   TRANSTHORACIC ECHOCARDIOGRAM  06/2017   EF 60-65%.  Mild diastolic dysfunction.  No significant valvular abnormality.  Mild aortic calcification.   WISDOM TOOTH EXTRACTION Bilateral     MEDICATIONS:  Armodafinil (NUVIGIL) 150 MG tablet   aspirin EC 81 MG tablet   Calcium Carb-Cholecalciferol (CALCIUM 600/VITAMIN D PO)   carvedilol (COREG) 12.5 MG tablet   losartan-hydrochlorothiazide (HYZAAR) 100-25 MG  tablet   MAGNESIUM PO   metFORMIN (GLUCOPHAGE-XR) 500 MG 24 hr tablet   Multiple Vitamin (MULTIVITAMIN WITH MINERALS) TABS tablet   testosterone cypionate (DEPOTESTOSTERONE CYPIONATE) 200 MG/ML injection   TRULICITY 1.5 MG/0.5ML SOAJ   vitamin B-12 (CYANOCOBALAMIN) 1000 MCG tablet   No current facility-administered medications for this encounter.   Marcille Blanco [facility] Short Stay/Anesthesiology [facility] Phone [phone] 08/14/2023 12:21 PM

## 2023-08-21 DIAGNOSIS — M1711 Unilateral primary osteoarthritis, right knee: Secondary | ICD-10-CM | POA: Insufficient documentation

## 2023-08-21 NOTE — H&P (Signed)
 TOTAL KNEE ADMISSION H&P  Patient is being admitted for right total knee arthroplasty.  Subjective:  Chief Complaint:right knee pain.  HPI: Alexander [name], 73 y.o. male, has a history of pain and functional disability in the right knee due to arthritis and has failed non-surgical conservative treatments for greater than 12 weeks to includeNSAID's and/or analgesics, corticosteriod injections, flexibility and strengthening excercises, supervised PT with diminished ADL's post treatment, use of assistive devices, weight reduction as appropriate, and activity modification.  Onset of symptoms was gradual, starting 5 years ago with gradually worsening course since that time. The patient noted prior procedures on the knee to include  a quad tendon repair  on the right knee(s).  Patient currently rates pain in the right knee(s) at 10 out of 10 with activity. Patient has night pain, worsening of pain with activity and weight bearing, pain that interferes with activities of daily living, pain with passive range of motion, crepitus, and joint swelling.  Patient has evidence of subchondral sclerosis, periarticular osteophytes, and joint space narrowing by imaging studies. There is no active infection.  Patient Active Problem List   Diagnosis Date Noted   Unilateral primary osteoarthritis, right knee 08/21/2023   OSA on CPAP 08/10/2023   Status post total replacement of right hip 03/30/2021   Unilateral primary osteoarthritis, right hip 10/16/2020   Hyperlipidemia due to dietary fat intake 10/01/2019   Morbid obesity (HCC) 10/01/2019   Excessive daytime sleepiness 03/03/2018   Obstructive sleep apnea treated with bilevel positive airway pressure (BiPAP) 03/03/2018   Sleep paralysis, recurrent isolated 03/03/2018   Cataplexy 03/03/2018   Abnormal dreams 03/03/2018   Breathholding 03/03/2018   Hypersomnia, persistent 09/03/2017   Exertional dyspnea 06/20/2017   Vasovagal syncope 06/20/2017   Essential  hypertension 06/20/2017   History of cardiomyopathy 06/20/2017   Past Medical History:  Diagnosis Date   Allergy    Cancer (HCC)    skin cancer to upper chest/basal cell 2007   Cardiomyopathy [facility])    Chronic neck pain    DJD   Chronic pain in right foot    Complication of anesthesia    slow to wake up after quadraceps surgery at [facility].   DDD (degenerative disc disease), lumbar    Depression    Dyslipidemia    Erectile dysfunction    Exogenous obesity    GERD (gastroesophageal reflux disease)    H/O acquired cardiomyopathy 1996   RESOLVED (apparently was thought to have had an MI, but did not have any intervention).  He developed cardiomyopathy that forced him to retire from [employer]..  The current EF 60-65%.   Heart murmur    History of fracture due to fall 1988   Bilateral wrist fractures   Hyperlipidemia    Hypertension    Hypogonadism male    IBS (irritable bowel syndrome)    Obstructive sleep apnea    cpap since 1999   Pre-diabetes    on Metformin  DAily with supper    Past Surgical History:  Procedure Laterality Date   CARDIAC CATHETERIZATION  2000   Nonobstructive disease.  Persistently reduced EF   CARPAL TUNNEL RELEASE Left    CARPAL TUNNEL RELEASE Right 01/05/2019   Procedure: RIGHT CARPAL TUNNEL RELEASE;  Surgeon: Murrell Drivers, MD;  Location: [facility];  Service: Orthopedics;  Laterality: Right;  Bier block   COLONOSCOPY     CORONARY CT ANGIOGRAM  07/2017   Coronary calcium score 0?.  Focal noncalcific plaque (30%) in  proximal LAD.  Otherwise no significant CAD.  Normal aortic root (3.2 cm)   HAMMER TOE SURGERY Bilateral    ingrown toenail surgery Bilateral    2017   IR RADIOLOGY PERIPHERAL GUIDED IV START  07/25/2017   IR US  GUIDE VASC ACCESS RIGHT  07/25/2017   MENISCUS REPAIR Right    2016   ORIF WRIST FRACTURE Bilateral    PLANTAR FASCIA RELEASE Right    POLYPECTOMY     SHOULDER ARTHROSCOPY Left    TENDON REPAIR Right     right quad tendon repair   Tilt Table Test     Noted to have vasovagal syncope   TOTAL HIP ARTHROPLASTY Left 12/08/2020   Procedure: LEFT TOTAL HIP ARTHROPLASTY ANTERIOR APPROACH;  Surgeon: Vernetta Lonni GRADE, MD;  Location: [facility];  Service: Orthopedics;  Laterality: Left;  Needs RNFA   TOTAL HIP ARTHROPLASTY Right 03/30/2021   Procedure: RIGHT TOTAL HIP ARTHROPLASTY ANTERIOR APPROACH;  Surgeon: Vernetta Lonni GRADE, MD;  Location: [facility];  Service: Orthopedics;  Laterality: Right;   TRANSTHORACIC ECHOCARDIOGRAM  06/21/2015   Normal LV function.  EF 57%.  Mild left atrial enlargement, trace AI, trace MR, mild PI.   TRANSTHORACIC ECHOCARDIOGRAM  06/2017   EF 60-65%.  Mild diastolic dysfunction.  No significant valvular abnormality.  Mild aortic calcification.   WISDOM TOOTH EXTRACTION Bilateral     No current facility-administered medications for this encounter.   Current Outpatient Medications  Medication Sig Dispense Refill Last Dose/Taking   Armodafinil  (NUVIGIL ) 150 MG tablet Take 1 tablet (150 mg total) by mouth daily. 90 tablet 1 Taking   aspirin  EC 81 MG tablet Take 81 mg by mouth daily. Swallow whole.   Taking   Calcium Carb-Cholecalciferol (CALCIUM 600/VITAMIN D PO) Take 1 tablet by mouth daily.   Taking   carvedilol  (COREG ) 12.5 MG tablet Take 1 tablet (12.5 mg total) by mouth 2 (two) times daily with a meal. 30 tablet 0 Taking   losartan -hydrochlorothiazide  (HYZAAR) 100-25 MG tablet Take 1 tablet by mouth daily. 30 tablet 0 Taking   MAGNESIUM  PO Take 250 mg by mouth in the morning and at bedtime.   Taking   metFORMIN  (GLUCOPHAGE -XR) 500 MG 24 hr tablet Take 500 mg by mouth daily with supper.   Taking   Multiple Vitamin (MULTIVITAMIN WITH MINERALS) TABS tablet Take 1 tablet by mouth daily.   Taking   testosterone  cypionate (DEPOTESTOSTERONE CYPIONATE) 200 MG/ML injection Inject 200 mg into the muscle See admin instructions. Every 10 days   Taking   TRULICITY 1.5 MG/0.5ML  SOAJ Inject 1.5 mg into the skin once a week.   Taking   vitamin B-12 (CYANOCOBALAMIN ) 1000 MCG tablet Take 2,000 mcg by mouth daily.   Taking   No Known Allergies  Social History   Tobacco Use   Smoking status: Former    Current packs/day: 0.00    Types: Cigarettes    Quit date: 1996    Years since quitting: 29.0   Smokeless tobacco: Never  Substance Use Topics   Alcohol  use: Yes    Alcohol /week: 2.0 standard drinks of alcohol     Types: 2 Standard drinks or equivalent per week    Comment: social    Family History  Problem Relation Age of Onset   Cancer Mother        duodenal CA   Heart disease Mother    Depression Mother    Stomach cancer Mother    Cancer Father    Hypertension Father  High Cholesterol Father    Heart disease Father    Diabetes Father    Hypertension Sister    High Cholesterol Sister    Heart disease Sister    Esophageal cancer Neg Hx    Rectal cancer Neg Hx    Colon cancer Neg Hx    Colon polyps Neg Hx      Review of Systems  Objective:  Physical Exam Vitals reviewed.  Constitutional:      Appearance: Normal appearance. He is normal weight.  HENT:     Head: Normocephalic and atraumatic.  Eyes:     Extraocular Movements: Extraocular movements intact.     Pupils: Pupils are equal, round, and reactive to light.  Cardiovascular:     Rate and Rhythm: Normal rate and regular rhythm.     Pulses: Normal pulses.  Pulmonary:     Effort: Pulmonary effort is normal.     Breath sounds: Normal breath sounds.  Abdominal:     Palpations: Abdomen is soft.  Musculoskeletal:     Cervical back: Normal range of motion and neck supple.     Right knee: Effusion and bony tenderness present. Decreased range of motion. Tenderness present over the medial joint line, lateral joint line and patellar tendon. Abnormal alignment.  Neurological:     Mental Status: He is alert and oriented to person, place, and time.  Psychiatric:        Behavior: Behavior  normal.     Vital signs in last 24 hours:    Labs:   Estimated body mass index is 34.18 kg/m as calculated from the following:   Height as of 08/11/23: 6' (1.829 m).   Weight as of 08/11/23: 114.3 kg.   Imaging Review Plain radiographs demonstrate severe degenerative joint disease of the right knee(s). The overall alignment ismild varus. The bone quality appears to be good for age and reported activity level.      Assessment/Plan:  End stage arthritis, right knee   The patient history, physical examination, clinical judgment of the provider and imaging studies are consistent with end stage degenerative joint disease of the right knee(s) and total knee arthroplasty is deemed medically necessary. The treatment options including medical management, injection therapy arthroscopy and arthroplasty were discussed at length. The risks and benefits of total knee arthroplasty were presented and reviewed. The risks due to aseptic loosening, infection, stiffness, patella tracking problems, thromboembolic complications and other imponderables were discussed. The patient acknowledged the explanation, agreed to proceed with the plan and consent was signed. Patient is being admitted for inpatient treatment for surgery, pain control, PT, OT, prophylactic antibiotics, VTE prophylaxis, progressive ambulation and ADL's and discharge planning. The patient is planning to be discharged home with home health services

## 2023-08-22 ENCOUNTER — Encounter (HOSPITAL_COMMUNITY)
Admission: RE | Disposition: A | Discharge status: Home / Self Care | Payer: Self-pay | Source: Home / Self Care | Attending: Orthopaedic Surgery

## 2023-08-22 ENCOUNTER — Encounter (HOSPITAL_COMMUNITY): Payer: Self-pay | Admitting: Orthopaedic Surgery

## 2023-08-22 ENCOUNTER — Observation Stay (HOSPITAL_COMMUNITY): Payer: Medicare Other

## 2023-08-22 ENCOUNTER — Ambulatory Visit (HOSPITAL_COMMUNITY): Payer: Medicare Other | Admitting: Medical

## 2023-08-22 ENCOUNTER — Other Ambulatory Visit: Payer: Self-pay | Admitting: *Deleted

## 2023-08-22 ENCOUNTER — Observation Stay (HOSPITAL_COMMUNITY)
Admission: RE | Admit: 2023-08-22 | Discharge: 2023-08-23 | Disposition: A | Discharge status: Home / Self Care | Payer: Medicare Other | Attending: Orthopaedic Surgery | Admitting: Orthopaedic Surgery

## 2023-08-22 ENCOUNTER — Ambulatory Visit (HOSPITAL_BASED_OUTPATIENT_CLINIC_OR_DEPARTMENT_OTHER): Payer: Medicare Other | Admitting: Anesthesiology

## 2023-08-22 ENCOUNTER — Telehealth: Payer: Self-pay | Admitting: *Deleted

## 2023-08-22 DIAGNOSIS — Z7984 Long term (current) use of oral hypoglycemic drugs: Secondary | ICD-10-CM | POA: Diagnosis not present

## 2023-08-22 DIAGNOSIS — I10 Essential (primary) hypertension: Secondary | ICD-10-CM | POA: Diagnosis not present

## 2023-08-22 DIAGNOSIS — Z7982 Long term (current) use of aspirin: Secondary | ICD-10-CM | POA: Diagnosis not present

## 2023-08-22 DIAGNOSIS — Z79899 Other long term (current) drug therapy: Secondary | ICD-10-CM | POA: Diagnosis not present

## 2023-08-22 DIAGNOSIS — Z7985 Long-term (current) use of injectable non-insulin antidiabetic drugs: Secondary | ICD-10-CM | POA: Diagnosis not present

## 2023-08-22 DIAGNOSIS — Z96651 Presence of right artificial knee joint: Secondary | ICD-10-CM | POA: Diagnosis not present

## 2023-08-22 DIAGNOSIS — G4733 Obstructive sleep apnea (adult) (pediatric): Secondary | ICD-10-CM | POA: Diagnosis not present

## 2023-08-22 DIAGNOSIS — M1711 Unilateral primary osteoarthritis, right knee: Secondary | ICD-10-CM | POA: Diagnosis not present

## 2023-08-22 DIAGNOSIS — Z85828 Personal history of other malignant neoplasm of skin: Secondary | ICD-10-CM | POA: Insufficient documentation

## 2023-08-22 DIAGNOSIS — Z87891 Personal history of nicotine dependence: Secondary | ICD-10-CM

## 2023-08-22 DIAGNOSIS — Z471 Aftercare following joint replacement surgery: Secondary | ICD-10-CM | POA: Diagnosis not present

## 2023-08-22 DIAGNOSIS — Z96643 Presence of artificial hip joint, bilateral: Secondary | ICD-10-CM | POA: Diagnosis not present

## 2023-08-22 DIAGNOSIS — Z01818 Encounter for other preprocedural examination: Secondary | ICD-10-CM

## 2023-08-22 HISTORY — PX: TOTAL KNEE ARTHROPLASTY: SHX125

## 2023-08-22 LAB — GLUCOSE, CAPILLARY
Glucose-Capillary: 91 mg/dL (ref 70–99)
Glucose-Capillary: 100 mg/dL — ABNORMAL HIGH (ref 70–99)

## 2023-08-22 SURGERY — ARTHROPLASTY, KNEE, TOTAL
Anesthesia: Monitor Anesthesia Care | Site: Knee | Laterality: Right

## 2023-08-22 MED ORDER — FENTANYL CITRATE PF 50 MCG/ML IJ SOSY
50.0000 ug | PREFILLED_SYRINGE | Freq: Once | INTRAMUSCULAR | Status: AC
  Administered 2023-08-22: 100 ug via INTRAVENOUS
  Filled 2023-08-22: qty 2

## 2023-08-22 MED ORDER — PROPOFOL 10 MG/ML IV BOLUS
INTRAVENOUS | Status: DC | PRN
  Administered 2023-08-22: 30 mg via INTRAVENOUS
  Administered 2023-08-22: 20 mg via INTRAVENOUS

## 2023-08-22 MED ORDER — CEFAZOLIN SODIUM-DEXTROSE 2-4 GM/100ML-% IV SOLN
2.0000 g | Freq: Four times a day (QID) | INTRAVENOUS | Status: AC
  Administered 2023-08-22 – 2023-08-23 (×2): 2 g via INTRAVENOUS
  Filled 2023-08-22 (×3): qty 100

## 2023-08-22 MED ORDER — ACETAMINOPHEN 160 MG/5ML PO SOLN: 325.0000 mg | ORAL | Status: DC | PRN

## 2023-08-22 MED ORDER — TRANEXAMIC ACID-NACL 1000-0.7 MG/100ML-% IV SOLN
1000.0000 mg | INTRAVENOUS | Status: AC
  Administered 2023-08-22: 1000 mg via INTRAVENOUS
  Filled 2023-08-22: qty 100

## 2023-08-22 MED ORDER — MIDAZOLAM HCL 2 MG/2ML IJ SOLN
1.0000 mg | Freq: Once | INTRAMUSCULAR | Status: AC
  Administered 2023-08-22: 2 mg via INTRAVENOUS
  Filled 2023-08-22: qty 2

## 2023-08-22 MED ORDER — ACETAMINOPHEN 500 MG PO TABS
1000.0000 mg | ORAL_TABLET | Freq: Once | ORAL | Status: AC
  Administered 2023-08-22: 1000 mg via ORAL
  Filled 2023-08-22: qty 2

## 2023-08-22 MED ORDER — PROPOFOL 1000 MG/100ML IV EMUL
INTRAVENOUS | Status: AC
  Filled 2023-08-22: qty 100

## 2023-08-22 MED ORDER — BUPIVACAINE-EPINEPHRINE 0.25% -1:200000 IJ SOLN
INTRAMUSCULAR | Status: DC | PRN
  Administered 2023-08-22: 30 mL

## 2023-08-22 MED ORDER — PROPOFOL 500 MG/50ML IV EMUL
INTRAVENOUS | Status: DC | PRN
  Administered 2023-08-22: 75 ug/kg/min via INTRAVENOUS

## 2023-08-22 MED ORDER — POVIDONE-IODINE 10 % EX SWAB: 2.0000 | Freq: Once | CUTANEOUS | Status: DC

## 2023-08-22 MED ORDER — OXYCODONE HCL 5 MG PO TABS: 5.0000 mg | ORAL_TABLET | Freq: Once | ORAL | Status: DC | PRN

## 2023-08-22 MED ORDER — ONDANSETRON HCL 4 MG/2ML IJ SOLN
INTRAMUSCULAR | Status: DC | PRN
  Administered 2023-08-22: 4 mg via INTRAVENOUS

## 2023-08-22 MED ORDER — ONDANSETRON HCL 4 MG/2ML IJ SOLN
INTRAMUSCULAR | Status: AC
  Filled 2023-08-22: qty 2

## 2023-08-22 MED ORDER — LACTATED RINGERS IV SOLN: INTRAVENOUS | Status: DC

## 2023-08-22 MED ORDER — ORAL CARE MOUTH RINSE: 15.0000 mL | Freq: Once | OROMUCOSAL | Status: AC

## 2023-08-22 MED ORDER — CELECOXIB 200 MG PO CAPS
200.0000 mg | ORAL_CAPSULE | Freq: Once | ORAL | Status: AC
  Administered 2023-08-22: 200 mg via ORAL
  Filled 2023-08-22: qty 1

## 2023-08-22 MED ORDER — ACETAMINOPHEN 500 MG PO TABS: 1000.0000 mg | ORAL_TABLET | Freq: Once | ORAL | Status: DC

## 2023-08-22 MED ORDER — BUPIVACAINE-EPINEPHRINE 0.25% -1:200000 IJ SOLN
INTRAMUSCULAR | Status: AC
  Filled 2023-08-22: qty 1

## 2023-08-22 MED ORDER — CHLORHEXIDINE GLUCONATE 0.12 % MT SOLN
15.0000 mL | Freq: Once | OROMUCOSAL | Status: AC
Start: 2023-08-22 — End: 2023-08-22
  Administered 2023-08-22: 15 mL via OROMUCOSAL

## 2023-08-22 MED ORDER — BUPIVACAINE IN DEXTROSE 0.75-8.25 % IT SOLN
INTRATHECAL | Status: DC | PRN
  Administered 2023-08-22: 1.8 mL via INTRATHECAL

## 2023-08-22 MED ORDER — OXYCODONE HCL 5 MG/5ML PO SOLN
5.0000 mg | Freq: Once | ORAL | Status: DC | PRN
Start: 2023-08-22 — End: 2023-08-22

## 2023-08-22 MED ORDER — DEXAMETHASONE SODIUM PHOSPHATE 10 MG/ML IJ SOLN
INTRAMUSCULAR | Status: AC
  Filled 2023-08-22: qty 1

## 2023-08-22 MED ORDER — 0.9 % SODIUM CHLORIDE (POUR BTL) OPTIME
TOPICAL | Status: DC | PRN
  Administered 2023-08-22: 1000 mL

## 2023-08-22 MED ORDER — ONDANSETRON HCL 4 MG/2ML IJ SOLN: 4.0000 mg | Freq: Once | INTRAMUSCULAR | Status: DC | PRN

## 2023-08-22 MED ORDER — STERILE WATER FOR IRRIGATION IR SOLN
Status: DC | PRN
  Administered 2023-08-22: 1000 mL

## 2023-08-22 MED ORDER — ACETAMINOPHEN 325 MG PO TABS: 325.0000 mg | ORAL_TABLET | ORAL | Status: DC | PRN

## 2023-08-22 MED ORDER — FENTANYL CITRATE PF 50 MCG/ML IJ SOSY
25.0000 ug | PREFILLED_SYRINGE | INTRAMUSCULAR | Status: DC | PRN
Start: 2023-08-22 — End: 2023-08-22

## 2023-08-22 MED ORDER — MEPERIDINE HCL 50 MG/ML IJ SOLN: 6.2500 mg | INTRAMUSCULAR | Status: DC | PRN

## 2023-08-22 MED ORDER — SODIUM CHLORIDE 0.9 % IR SOLN
Status: DC | PRN
  Administered 2023-08-22: 1000 mL

## 2023-08-22 MED ORDER — DEXAMETHASONE SODIUM PHOSPHATE 10 MG/ML IJ SOLN
INTRAMUSCULAR | Status: DC | PRN
  Administered 2023-08-22: 10 mg via INTRAVENOUS

## 2023-08-22 MED ORDER — CEFAZOLIN SODIUM-DEXTROSE 2-4 GM/100ML-% IV SOLN
2.0000 g | INTRAVENOUS | Status: AC
  Administered 2023-08-22: 2 g via INTRAVENOUS
  Filled 2023-08-22: qty 100

## 2023-08-22 MED ORDER — CELECOXIB 200 MG PO CAPS: 200.0000 mg | ORAL_CAPSULE | Freq: Once | ORAL | Status: DC

## 2023-08-22 SURGICAL SUPPLY — 54 items
BAG COUNTER SPONGE SURGICOUNT (BAG) IMPLANT
BAG ZIPLOCK 12X15 (MISCELLANEOUS) ×1 IMPLANT
BENZOIN TINCTURE PRP APPL 2/3 (GAUZE/BANDAGES/DRESSINGS) IMPLANT
BLADE SAG 18X100X1.27 (BLADE) ×1 IMPLANT
BLADE SURG SZ10 CARB STEEL (BLADE) ×2 IMPLANT
BNDG ELASTIC 6INX 5YD STR LF (GAUZE/BANDAGES/DRESSINGS) ×2 IMPLANT
BOWL SMART MIX CTS (DISPOSABLE) IMPLANT
CEMENT BONE R 1X40 (Cement) IMPLANT
COMP FEM CMT PS STD 11 RT (Joint) ×1 IMPLANT
COMP PATELLA 3 PEG 35 (Joint) ×1 IMPLANT
COMP TIB KNEE PS G 0 RT (Joint) ×1 IMPLANT
COMPONENT FEM CMT PS STD 11 RT (Joint) IMPLANT
COMPONENT PATELLA 3 PEG 35 (Joint) IMPLANT
COMPONENT TIB KNEE PS G 0 RT (Joint) IMPLANT
COOLER ICEMAN CLASSIC (MISCELLANEOUS) ×1 IMPLANT
COVER SURGICAL LIGHT HANDLE (MISCELLANEOUS) ×1 IMPLANT
CUFF TRNQT CYL 34X4.125X (TOURNIQUET CUFF) ×1 IMPLANT
DRAPE INCISE IOBAN 66X45 STRL (DRAPES) ×1 IMPLANT
DRAPE U-SHAPE 47X51 STRL (DRAPES) ×1 IMPLANT
DURAPREP 26ML APPLICATOR (WOUND CARE) ×1 IMPLANT
ELECT BLADE TIP CTD 4 INCH (ELECTRODE) ×1 IMPLANT
ELECT REM PT RETURN 15FT ADLT (MISCELLANEOUS) ×1 IMPLANT
GAUZE PAD ABD 8X10 STRL (GAUZE/BANDAGES/DRESSINGS) ×2 IMPLANT
GAUZE SPONGE 4X4 12PLY STRL (GAUZE/BANDAGES/DRESSINGS) ×1 IMPLANT
GAUZE XEROFORM 1X8 LF (GAUZE/BANDAGES/DRESSINGS) IMPLANT
GLOVE BIO SURGEON STRL SZ7.5 (GLOVE) ×1 IMPLANT
GLOVE BIOGEL PI IND STRL 8 (GLOVE) ×2 IMPLANT
GLOVE ECLIPSE 8.0 STRL XLNG CF (GLOVE) ×1 IMPLANT
GOWN STRL REUS W/ TWL XL LVL3 (GOWN DISPOSABLE) ×2 IMPLANT
HOLDER FOLEY CATH W/STRAP (MISCELLANEOUS) IMPLANT
IMMOBILIZER KNEE 20 (SOFTGOODS) ×1
IMMOBILIZER KNEE 20 THIGH 36 (SOFTGOODS) ×1 IMPLANT
IMMOBILIZER KNEE 22 UNIV (SOFTGOODS) IMPLANT
INSERT TIBIAL PERSONA 10 RT (Insert) IMPLANT
KIT TURNOVER KIT A (KITS) IMPLANT
NS IRRIG 1000ML POUR BTL (IV SOLUTION) ×1 IMPLANT
PACK TOTAL KNEE CUSTOM (KITS) ×1 IMPLANT
PAD COLD SHLDR WRAP-ON (PAD) ×1 IMPLANT
PADDING CAST COTTON 6X4 STRL (CAST SUPPLIES) ×2 IMPLANT
PIN DRILL HDLS TROCAR 75 4PK (PIN) IMPLANT
PROTECTOR NERVE ULNAR (MISCELLANEOUS) ×1 IMPLANT
SCREW FEMALE HEX FIX 25X2.5 (ORTHOPEDIC DISPOSABLE SUPPLIES) IMPLANT
SET HNDPC FAN SPRY TIP SCT (DISPOSABLE) ×1 IMPLANT
SET PAD KNEE POSITIONER (MISCELLANEOUS) ×1 IMPLANT
SPIKE FLUID TRANSFER (MISCELLANEOUS) IMPLANT
STAPLER SKIN PROX WIDE 3.9 (STAPLE) IMPLANT
STRIP CLOSURE SKIN 1/2X4 (GAUZE/BANDAGES/DRESSINGS) IMPLANT
SUT MNCRL AB 4-0 PS2 18 (SUTURE) IMPLANT
SUT VIC AB 0 CT1 27XBRD ANTBC (SUTURE) ×1 IMPLANT
SUT VIC AB 1 CT1 36 (SUTURE) ×2 IMPLANT
SUT VIC AB 2-0 CT1 TAPERPNT 27 (SUTURE) ×2 IMPLANT
TIBIAL INSERT PERSONA 10 RT (Insert) ×1 IMPLANT
TRAY FOLEY MTR SLVR 16FR STAT (SET/KITS/TRAYS/PACK) IMPLANT
WATER STERILE IRR 1000ML POUR (IV SOLUTION) ×2 IMPLANT

## 2023-08-22 NOTE — Interval H&P Note (Signed)
 History and Physical Interval Note: The patient understands that he is here today for right total knee replacement to treat his significant right knee pain and arthritis.  There has been no acute or interval change in his medical status.  The risks and benefits of surgery been discussed in detail and informed consent has been obtained.  The right operative knee has been marked.  08/22/2023 12:55 PM  Alexander [name]  has presented today for surgery, with the diagnosis of osteoarthritis right knee.  The various methods of treatment have been discussed with the patient and family. After consideration of risks, benefits and other options for treatment, the patient has consented to  Procedure(s): RIGHT TOTAL KNEE ARTHROPLASTY (Right) as a surgical intervention.  The patient's history has been reviewed, patient examined, no change in status, stable for surgery.  I have reviewed the patient's chart and labs.  Questions were answered to the patient's satisfaction.     [name]

## 2023-08-22 NOTE — Progress Notes (Signed)
   08/22/23 2107  BiPAP/CPAP/SIPAP  $ Non-Invasive Home Ventilator  Initial  BiPAP/CPAP/SIPAP Pt Type Adult  BiPAP/CPAP/SIPAP DREAMSTATIOND  Mask Type Nasal mask (Pt mask and tubing from home)  FiO2 (%) 21 %  Patient Home Equipment No  Auto Titrate Yes (auto 12/4 per pt his home settings)  CPAP/SIPAP surface wiped down Yes  BiPAP/CPAP /SiPAP Vitals  Pulse Rate 76  Resp 18  SpO2 97 %  Bilateral Breath Sounds Clear;Diminished  MEWS Score/Color  MEWS Score 0  MEWS Score Color Alexander [name]

## 2023-08-22 NOTE — Care Plan (Signed)
 [facility] RNCM call to patient to discuss his upcoming Right total knee arthroplasty with Dr. Vernetta on 08/22/23 at [facility]. He is agreeable to case management. He lives with his spouse and his plan is to return home with assistance from her. Patient has had 2 previous hip replacements with Dr. Vernetta and has a RW. He is aware and iceman ice machine will be provided after surgery. Anticipate HHPT will be needed after short hospital stay. Referral made to [facility] after choice provided. Reviewed post op care instructions. Will continue to follow for needs.

## 2023-08-22 NOTE — Transfer of Care (Signed)
 Immediate Anesthesia Transfer of Care Note  Patient: Alexander [name]  Procedure(s) Performed: RIGHT TOTAL KNEE ARTHROPLASTY (Right: Knee)  Patient Location: PACU  Anesthesia Type:Spinal and MAC combined with regional for post-op pain  Level of Consciousness: awake, alert , and patient cooperative  Airway & Oxygen  Therapy: Patient Spontanous Breathing and Patient connected to face mask oxygen   Post-op Assessment: Report given to RN and Post -op Vital signs reviewed and stable  Post vital signs: Reviewed and stable  Last Vitals:  Vitals Value Taken Time  BP 147/79 08/22/23 1624  Temp    Pulse 63 08/22/23 1625  Resp 14 08/22/23 1625  SpO2 99 % 08/22/23 1625  Vitals shown include unfiled device data.  Last Pain:  Vitals:   08/22/23 1341  TempSrc:   PainSc: 0-No pain         Complications: No notable events documented.

## 2023-08-22 NOTE — Anesthesia Procedure Notes (Signed)
 Spinal  Patient location during procedure: OR Start time: 08/22/2023 2:23 PM End time: 08/22/2023 2:28 PM Reason for block: surgical anesthesia Staffing Performed: resident/CRNA  Resident/CRNA: Dasie Nena PARAS, CRNA Performed by: Dasie Nena PARAS, CRNA Authorized by: Mallory Manus, MD   Preanesthetic Checklist Completed: patient identified, IV checked, site marked, risks and benefits discussed, surgical consent, monitors and equipment checked, pre-op evaluation and timeout performed Spinal Block Patient position: sitting Prep: DuraPrep and site prepped and draped Patient monitoring: heart rate, continuous pulse ox, blood pressure and cardiac monitor Approach: midline Location: L3-4 Injection technique: single-shot Needle Needle type: Pencan  Needle gauge: 24 G Needle length: 10 cm Assessment Events: CSF return Additional Notes Expiration date on kit noted and within range.  Sterile prep and drape.    Local with Lidocaine  1%.  Good CSF flow noted with no heme or c/o paresthesia.   Patient tolerated well.

## 2023-08-22 NOTE — Op Note (Signed)
 Operative Note  Date of operation: 08/22/2023 Preoperative diagnosis: Right knee primary osteoarthritis Postoperative diagnosis: Same  Procedure: Right press-fit total knee arthroplasty  Implants: Biomet/Zimmer persona press-fit knee system Implant Name Type Inv. Item Serial No. Manufacturer Lot No. LRB No. Used Action  COMP PATELLA 3 PEG 35 - [id number] Joint COMP PATELLA 3 PEG 35  ZIMMER [employer](ORTH,TRAU,BIO,SG) [id number] Right 1 Implanted  TIBIAL INSERT PERSONA 10 RT - [id number] Insert TIBIAL INSERT PERSONA 10 RT  ZIMMER [employer]ON(ORTH,TRAU,BIO,SG) [id number] Right 1 Implanted  COMP FEM CMT PS STD 11 RT - [id number] Joint COMP FEM CMT PS STD 11 RT  ZIMMER [employer]ON(ORTH,TRAU,BIO,SG) [id number] Right 1 Implanted  COMP TIB KNEE PS G 0 RT - [id number] Joint COMP TIB KNEE PS G 0 RT  ZIMMER RECON(ORTH,TRAU,BIO,SG) [id number] Right 1 Implanted   Surgeon: Lonni GRADE. Vernetta, MD Assistant: Tory Gaskins, PA-C  Anesthesia: #1 right lower extremity adductor canal block, #2 spinal, #3 local Tourniquet time: Just under 1 hour Antibiotics: IV Ancef  EBL: Less than 100 cc Complications: None  Indications: The patient is a 73 year old gentleman well-known to me.  We have performed other surgery on him in the past with his hips.  He has developed severe debilitating arthritis of his right knee and this is getting worse over the last 12 months.  He does have a history 8 years ago of a right knee quadriceps tendon injury that required repair.  Over time he has developed slight varus malalignment of the knee but bone-on-bone wear of the medial compartment and the patellofemoral joint.  He has tried and failed all forms conservative treatment and does wish to proceed with a knee replacement at this standpoint and we agree as well.  He understands this can be slightly more difficult due to scar tissue from his quad tendon repair.  We did discuss the risk of acute blood loss anemia, nerve and vessel injury, fracture,  infection, implant failure, DVT and wound healing issues.  He understands that our goals are hopefully decreased pain, improved mobility, and improved quality of life.  Procedure description: After informed consent was obtained and the appropriate right knee was marked, the patient was brought to the operating room and set up on the operating table.  Of note in the holding room he did have a right lower extremity adductor canal block by anesthesia.  Spinal anesthesia was then obtained while he was on the operating table and he was laid in supine position.  A Foley catheter is placed and a nonsterile tractors placed around his upper right thigh.  His right thigh, knee, leg and ankle were prepped and draped in DuraPrep and sterile drapes.  A timeout was called and he was identified as the correct patient the correct right knee.  We then made a direct midline incision over the patella and carried this proximally distally.  Proximal to the patella we did use his previous incision.  We dissected down the knee joint and carried out a medial parapatellar arthrotomy finding a moderate joint effusion.  We found significant scar tissue around his quad tendon repair.  We ended up needing to make our patella cut first in order to be able to invert the patella.  It was a large patella and we measured it for a size 35 patella button.  We removed osteophytes from the in all 3 compartments of the knee with the knee in a flexed position.  We removed remnants of the ACL as well as medial and lateral meniscus and osteophytes from  all 3 compartments.  We then made our proximal tibia cut using an extramedullary cutting guide correction of varus and valgus and a 7 degree slope.  We made this cut to take 2 mm off the low side and we needed to backed this down to more millimeters.  We then used a intramedullary cutting guide for our distal femur cut setting this for right knee at 5 degrees externally rotated and a 10 mm distal femoral  cut.  We made that cut without difficulty and backed this down to more millimeters as well.  We then brought the knee back down to full extension and with a 10 mm extension block at achieve full extension.  We then back to the femur and put a femoral sizing guide based off the epicondylar axis.  We chose a size 11 femur based off of this.  We put a 4-in-1 cutting block for a size 11 femur and made our anterior and posterior cuts followed our chamfer cuts.  We then backed the tibia and chose a size G tibial tray for right knee setting the rotation off the tibial tubercle and the femur and this did have a good fit on the tibial plateau.  Of note we found excellent quality bone for press-fit implants.  We did our drill hole and keel punch off of the right size G trial tibia.  We then trialed our size 11 press-fit standard femur.  We then placed a 10 mm thickness medial congruent right polythene insert we are pleased range of motion and stability this insert also having already prepared the patella with a size 35 patella button.  We then removed all transportation the knee and irrigate the knee with normal saline solution using pulsatile lavage.  We then placed Marcaine  with epinephrine  around the arthrotomy.  Next with the knee in a flexed position we placed our Biomet/Zimmer press-fit tibial tray for right knee size G followed by press fitting our size 11 right CR standard femur.  We placed our 10 mm right medial congruent polythene insert and press-fit our size 35 patella button.  Again we are pleased with range of motion and stability with the real implants.  The tourniquet was then let down and hemostasis was obtained with electrocautery.  The arthrotomy is closed with interrupted #1 Vicryl suture followed by 0 Vicryl goes deep tissue and 2-0 Vicryl close the subcutaneous tissue.  The skin was closed with staples.  Well-padded sterile dressing was applied.  The patient was taken recovery room in stable condition.   Tory Gaskins, PA-C did assist during the entire case and beginning to end and his assistance was crucial and medically necessary for soft tissue management and retraction, helping guide implant placement and a layered closure of the wound.

## 2023-08-22 NOTE — Anesthesia Postprocedure Evaluation (Signed)
 Anesthesia Post Note  Patient: Alexander [name]  Procedure(s) Performed: RIGHT TOTAL KNEE ARTHROPLASTY (Right: Knee)     Patient location during evaluation: PACU Anesthesia Type: Regional, MAC and Spinal Level of consciousness: oriented and awake and alert Pain management: pain level controlled Vital Signs Assessment: post-procedure vital signs reviewed and stable Respiratory status: spontaneous breathing, respiratory function stable and patient connected to nasal cannula oxygen  Cardiovascular status: blood pressure returned to baseline and stable Postop Assessment: no headache, no backache and no apparent nausea or vomiting Anesthetic complications: no   No notable events documented.  Last Vitals:  Vitals:   08/22/23 1730 08/22/23 1745  BP: (!) 131/98 (!) 155/95  Pulse: (!) 56 62  Resp: 18 19  Temp:  36.5 C  SpO2: 93% 96%    Last Pain:  Vitals:   08/22/23 1745  TempSrc:   PainSc: 0-No pain                 Evani Shrider

## 2023-08-22 NOTE — Telephone Encounter (Signed)
 [facility] pre-op call completed on 08/21/23.

## 2023-08-22 NOTE — Anesthesia Procedure Notes (Signed)
 Procedure Name: MAC Date/Time: 08/22/2023 2:20 PM  Performed by: Dasie Nena PARAS, CRNAPre-anesthesia Checklist: Patient identified, Emergency Drugs available, Suction available, Patient being monitored and Timeout performed Oxygen  Delivery Method: Simple face mask Placement Confirmation: positive ETCO2

## 2023-08-23 ENCOUNTER — Other Ambulatory Visit: Payer: Self-pay

## 2023-08-23 DIAGNOSIS — I10 Essential (primary) hypertension: Secondary | ICD-10-CM | POA: Diagnosis not present

## 2023-08-23 DIAGNOSIS — Z85828 Personal history of other malignant neoplasm of skin: Secondary | ICD-10-CM | POA: Diagnosis not present

## 2023-08-23 DIAGNOSIS — M1711 Unilateral primary osteoarthritis, right knee: Secondary | ICD-10-CM | POA: Diagnosis not present

## 2023-08-23 DIAGNOSIS — Z7982 Long term (current) use of aspirin: Secondary | ICD-10-CM | POA: Diagnosis not present

## 2023-08-23 DIAGNOSIS — Z96643 Presence of artificial hip joint, bilateral: Secondary | ICD-10-CM | POA: Diagnosis not present

## 2023-08-23 DIAGNOSIS — Z7984 Long term (current) use of oral hypoglycemic drugs: Secondary | ICD-10-CM | POA: Diagnosis not present

## 2023-08-23 LAB — BASIC METABOLIC PANEL
Anion gap: 7 (ref 5–15)
BUN: 16 mg/dL (ref 8–23)
CO2: 25 mmol/L (ref 22–32)
Calcium: 8.2 mg/dL — ABNORMAL LOW (ref 8.9–10.3)
Chloride: 103 mmol/L (ref 98–111)
Creatinine, Ser: 0.96 mg/dL (ref 0.61–1.24)
GFR, Estimated: >60 mL/min (ref >60)
Glucose, Bld: 221 mg/dL — ABNORMAL HIGH (ref 70–99)
Potassium: 3.7 mmol/L (ref 3.5–5.1)
Sodium: 135 mmol/L (ref 135–145)

## 2023-08-23 LAB — CBC
HCT: 45.8 % (ref 39.0–52.0)
Hemoglobin: 15.3 g/dL (ref 13.0–17.0)
MCH: 30.3 pg (ref 26.0–34.0)
MCHC: 33.4 g/dL (ref 30.0–36.0)
MCV: 90.7 fL (ref 80.0–100.0)
Platelets: 176 10*3/uL (ref 150–400)
RBC: 5.05 MIL/uL (ref 4.22–5.81)
RDW: 15.9 % — ABNORMAL HIGH (ref 11.5–15.5)
WBC: 19.8 10*3/uL — ABNORMAL HIGH (ref 4.0–10.5)
nRBC: 0 % (ref 0.0–0.2)

## 2023-08-23 MED ORDER — ONDANSETRON HCL 4 MG/2ML IJ SOLN: 4.0000 mg | Freq: Four times a day (QID) | INTRAMUSCULAR | Status: DC | PRN

## 2023-08-23 MED ORDER — SODIUM CHLORIDE 0.9% FLUSH
3.0000 mL | Freq: Two times a day (BID) | INTRAVENOUS | Status: DC
  Administered 2023-08-23: 10 mL via INTRAVENOUS

## 2023-08-23 MED ORDER — SODIUM CHLORIDE 0.9% FLUSH: 3.0000 mL | INTRAVENOUS | Status: DC | PRN

## 2023-08-23 MED ORDER — OXYCODONE HCL 5 MG PO TABS: 5.0000 mg | ORAL_TABLET | Freq: Four times a day (QID) | ORAL | 0 refills | Status: DC | PRN

## 2023-08-23 MED ORDER — ASPIRIN 81 MG PO CHEW: 81.0000 mg | CHEWABLE_TABLET | Freq: Two times a day (BID) | ORAL | 0 refills | Status: AC

## 2023-08-23 MED ORDER — METHOCARBAMOL 500 MG PO TABS: 500.0000 mg | ORAL_TABLET | Freq: Four times a day (QID) | ORAL | 1 refills | Status: AC | PRN

## 2023-08-23 MED ORDER — PANTOPRAZOLE SODIUM 40 MG PO TBEC
40.0000 mg | DELAYED_RELEASE_TABLET | Freq: Every day | ORAL | Status: DC
  Administered 2023-08-23: 40 mg via ORAL
  Filled 2023-08-23: qty 1

## 2023-08-23 MED ORDER — ALUM & MAG HYDROXIDE-SIMETH 200-200-20 MG/5ML PO SUSP: 30.0000 mL | ORAL | Status: DC | PRN

## 2023-08-23 MED ORDER — HYDROCHLOROTHIAZIDE 25 MG PO TABS
25.0000 mg | ORAL_TABLET | Freq: Every day | ORAL | Status: DC
  Administered 2023-08-23: 25 mg via ORAL
  Filled 2023-08-23: qty 1

## 2023-08-23 MED ORDER — METOCLOPRAMIDE HCL 5 MG PO TABS: 5.0000 mg | ORAL_TABLET | Freq: Three times a day (TID) | ORAL | Status: DC | PRN

## 2023-08-23 MED ORDER — METHOCARBAMOL 500 MG PO TABS: 500.0000 mg | ORAL_TABLET | Freq: Four times a day (QID) | ORAL | Status: DC | PRN

## 2023-08-23 MED ORDER — LOSARTAN POTASSIUM-HCTZ 100-25 MG PO TABS: 1.0000 | ORAL_TABLET | Freq: Every day | ORAL | Status: DC

## 2023-08-23 MED ORDER — METOCLOPRAMIDE HCL 5 MG/ML IJ SOLN: 5.0000 mg | Freq: Three times a day (TID) | INTRAMUSCULAR | Status: DC | PRN

## 2023-08-23 MED ORDER — MODAFINIL 200 MG PO TABS
200.0000 mg | ORAL_TABLET | Freq: Every day | ORAL | Status: DC
  Administered 2023-08-23: 200 mg via ORAL
  Filled 2023-08-23: qty 1

## 2023-08-23 MED ORDER — PHENOL 1.4 % MT LIQD: 1.0000 | OROMUCOSAL | Status: DC | PRN

## 2023-08-23 MED ORDER — ONDANSETRON HCL 4 MG PO TABS: 4.0000 mg | ORAL_TABLET | Freq: Four times a day (QID) | ORAL | Status: DC | PRN

## 2023-08-23 MED ORDER — OXYCODONE HCL 5 MG PO TABS: 5.0000 mg | ORAL_TABLET | ORAL | Status: DC | PRN

## 2023-08-23 MED ORDER — MENTHOL 3 MG MT LOZG: 1.0000 | LOZENGE | OROMUCOSAL | Status: DC | PRN

## 2023-08-23 MED ORDER — ACETAMINOPHEN 325 MG PO TABS
325.0000 mg | ORAL_TABLET | Freq: Four times a day (QID) | ORAL | Status: DC | PRN
Start: 2023-08-23 — End: 2023-08-23

## 2023-08-23 MED ORDER — ARMODAFINIL 150 MG PO TABS: 150.0000 mg | ORAL_TABLET | Freq: Every day | ORAL | Status: DC

## 2023-08-23 MED ORDER — OXYCODONE HCL 5 MG PO TABS
10.0000 mg | ORAL_TABLET | ORAL | Status: DC | PRN
  Administered 2023-08-23: 15 mg via ORAL
  Filled 2023-08-23: qty 3

## 2023-08-23 MED ORDER — CARVEDILOL 12.5 MG PO TABS
12.5000 mg | ORAL_TABLET | Freq: Two times a day (BID) | ORAL | Status: DC
  Administered 2023-08-23: 12.5 mg via ORAL
  Filled 2023-08-23: qty 1

## 2023-08-23 MED ORDER — METHOCARBAMOL 1000 MG/10ML IJ SOLN
500.0000 mg | Freq: Four times a day (QID) | INTRAMUSCULAR | Status: DC | PRN
  Administered 2023-08-23: 500 mg via INTRAVENOUS
  Filled 2023-08-23: qty 10

## 2023-08-23 MED ORDER — DOCUSATE SODIUM 100 MG PO CAPS
100.0000 mg | ORAL_CAPSULE | Freq: Two times a day (BID) | ORAL | Status: DC
  Administered 2023-08-23: 100 mg via ORAL
  Filled 2023-08-23: qty 1

## 2023-08-23 MED ORDER — ASPIRIN 81 MG PO CHEW
81.0000 mg | CHEWABLE_TABLET | Freq: Two times a day (BID) | ORAL | Status: DC
  Administered 2023-08-23: 81 mg via ORAL
  Filled 2023-08-23: qty 1

## 2023-08-23 MED ORDER — LOSARTAN POTASSIUM 50 MG PO TABS
100.0000 mg | ORAL_TABLET | Freq: Every day | ORAL | Status: DC
  Administered 2023-08-23: 100 mg via ORAL
  Filled 2023-08-23: qty 2

## 2023-08-23 MED ORDER — HYDROMORPHONE HCL 1 MG/ML IJ SOLN: 0.5000 mg | INTRAMUSCULAR | Status: DC | PRN

## 2023-08-23 MED ORDER — METFORMIN HCL ER 500 MG PO TB24: 500.0000 mg | ORAL_TABLET | Freq: Every day | ORAL | Status: DC

## 2023-08-23 MED ORDER — ONDANSETRON HCL 4 MG/2ML IJ SOLN
INTRAMUSCULAR | Status: AC
  Filled 2023-08-23: qty 2

## 2023-08-23 MED ORDER — DIPHENHYDRAMINE HCL 12.5 MG/5ML PO ELIX: 12.5000 mg | ORAL_SOLUTION | ORAL | Status: DC | PRN

## 2023-08-23 NOTE — Progress Notes (Signed)
 Physical Therapy Treatment Patient Details Name: Alexander [name] MRN: [id number] DOB: 10-May-1951 Today's Date: 08/23/2023   History of Present Illness 73 yo male s/p R TKA on 08/22/23. PMH: R DA THA 2022, OA, HTN, bil CTR,  L DA THA 2021.    PT Comments  Pt making excellent progress, meeting goals and is ready to d/c with spouse assisting as needed from PT standpoint.  RN updated.   If plan is discharge home, recommend the following: Help with stairs or ramp for entrance;Assist for transportation   Can travel by private vehicle        Equipment Recommendations  None recommended by PT    Recommendations for Other Services       Precautions / Restrictions Precautions Precautions: Fall;Knee Precaution Comments: IND SLR x5 Required Braces or Orthoses: Knee Immobilizer - Right Knee Immobilizer - Right: Discontinue once straight leg raise with < 10 degree lag Restrictions Weight Bearing Restrictions Per Provider Order: No Other Position/Activity Restrictions: WBAT     Mobility  Bed Mobility Overal bed mobility: Needs Assistance Bed Mobility: Supine to Sit     Supine to sit: Supervision     General bed mobility comments: in recliner    Transfers Overall transfer level: Needs assistance Equipment used: Rolling walker (2 wheels) Transfers: Sit to/from Stand Sit to Stand: Supervision           General transfer comment: cues for hand and RLE position    Ambulation/Gait Ambulation/Gait assistance: Contact guard assist, Supervision Gait Distance (Feet): 200 Feet Assistive device: Rolling walker (2 wheels) Gait Pattern/deviations: Step-to pattern, Step-through pattern, Decreased stride length, Decreased weight shift to right       General Gait Details: cues for sequence and RW position,progression to step through pattern with improved wt shift to RLE   Stairs Stairs: Yes Stairs assistance: Contact guard assist, Supervision Stair Management: Two rails, Step to  pattern, Forwards Number of Stairs: 3 (x2) General stair comments: cues for sequence and technique, good stability, no LOB or knee buckling   Wheelchair Mobility     Tilt Bed    Modified Rankin (Stroke Patients Only)       Balance Overall balance assessment: Needs assistance Sitting-balance support: No upper extremity supported, Feet supported Sitting balance-Leahy Scale: Good     Standing balance support: Reliant on assistive device for balance, During functional activity Standing balance-Leahy Scale: Fair                              Cognition Arousal: Alert Behavior During Therapy: WFL for tasks assessed/performed Overall Cognitive Status: Within Functional Limits for tasks assessed                                          Exercises Total Joint Exercises Ankle Circles/Pumps: AROM, Both, 10 reps Quad Sets: AROM, Both (3 reps) Heel Slides: AAROM, Right (6 reps) Straight Leg Raises: AROM, Right (3 reps)    General Comments        Pertinent Vitals/Pain Pain Assessment Pain Assessment: 0-10 Pain Score: 4  Faces Pain Scale: No hurt Pain Location: R knee Pain Descriptors / Indicators: Aching, Discomfort Pain Intervention(s): Limited activity within patient's tolerance, Monitored during session    Home Living Family/patient expects to be discharged to:: Private residence Living Arrangements: Spouse/significant other Available Help at Discharge: Family Type of  Home: House Home Access: Stairs to enter       Home Layout: One level;Laundry or work area in [employer] Equipment: Agricultural Consultant (2 wheels);BSC/3in1 Additional Comments: pt will not be allowed to go to basement per spouse    Prior Function            PT Goals (current goals can now be found in the care plan section) Acute Rehab PT Goals PT Goal Formulation: With patient Time For Goal Achievement: 08/30/23 Potential to Achieve Goals: Good Progress towards PT  goals: Progressing toward goals    Frequency    7X/week      PT Plan      Co-evaluation              AM-PAC PT 6 Clicks Mobility   Outcome Measure  Help needed turning from your back to your side while in a flat bed without using bedrails?: A Little Help needed moving from lying on your back to sitting on the side of a flat bed without using bedrails?: A Little Help needed moving to and from a bed to a chair (including a wheelchair)?: A Little Help needed standing up from a chair using your arms (e.g., wheelchair or bedside chair)?: A Little Help needed to walk in hospital room?: A Little Help needed climbing 3-5 steps with a railing? : A Little 6 Click Score: 18    End of Session Equipment Utilized During Treatment: Gait belt Activity Tolerance: Patient tolerated treatment well Patient left: with call bell/phone within reach;in chair;with family/visitor present Nurse Communication: Mobility status PT Visit Diagnosis: Other abnormalities of gait and mobility (R26.89)     Time: 8648-8582 PT Time Calculation (min) (ACUTE ONLY): 26 min  Charges:    $Gait Training: 23-37 mins PT General Charges $$ ACUTE PT VISIT: 1 Visit                     Laurajean Hosek, PT  Acute Rehab Dept [facility]) [phone]  08/23/2023    [facility] 08/23/2023, 2:35 PM

## 2023-08-23 NOTE — Discharge Instructions (Signed)

## 2023-08-23 NOTE — Discharge Summary (Addendum)
 Patient ID: Alexander [name] MRN: [id number] DOB/AGE: July 08, 1951 73 y.o.  Admit date: 08/22/2023 Discharge date: 08/23/2023  Admission Diagnoses:  Principal Problem:   Unilateral primary osteoarthritis, right knee Active Problems:   Status post total right knee replacement   Discharge Diagnoses:  Same  Past Medical History:  Diagnosis Date   Allergy    Cancer (HCC)    skin cancer to upper chest/basal cell 2007   Cardiomyopathy [facility])    Chronic neck pain    DJD   Chronic pain in right foot    Complication of anesthesia    slow to wake up after quadraceps surgery at [facility].   DDD (degenerative disc disease), lumbar    Depression    Dyslipidemia    Erectile dysfunction    Exogenous obesity    GERD (gastroesophageal reflux disease)    H/O acquired cardiomyopathy 1996   RESOLVED (apparently was thought to have had an MI, but did not have any intervention).  He developed cardiomyopathy that forced him to retire from [employer]..  The current EF 60-65%.   Heart murmur    History of fracture due to fall 1988   Bilateral wrist fractures   Hyperlipidemia    Hypertension    Hypogonadism male    IBS (irritable bowel syndrome)    Obstructive sleep apnea    cpap since 1999   Pre-diabetes    on Metformin  DAily with supper    Surgeries: Procedure(s): RIGHT TOTAL KNEE ARTHROPLASTY on 08/22/2023   Consultants:   Discharged Condition: Improved  Hospital Course: Alexander [name] is an 72 y.o. male who was admitted 08/22/2023 for operative treatment ofUnilateral primary osteoarthritis, right knee. Patient has severe unremitting pain that affects sleep, daily activities, and work/hobbies. After pre-op clearance the patient was taken to the operating room on 08/22/2023 and underwent  Procedure(s): RIGHT TOTAL KNEE ARTHROPLASTY.    Patient was given perioperative antibiotics:  Anti-infectives (From admission, onward)    Start     Dose/Rate Route Frequency Ordered Stop    08/23/23 0600  ceFAZolin  (ANCEF ) IVPB 2g/100 mL premix        2 g 200 mL/hr over 30 Minutes Intravenous On call to O.R. 08/22/23 1229 08/22/23 1439   08/22/23 2030  ceFAZolin  (ANCEF ) IVPB 2g/100 mL premix        2 g 200 mL/hr over 30 Minutes Intravenous Every 6 hours 08/22/23 1946 08/23/23 0356        Patient was given sequential compression devices, early ambulation, and chemoprophylaxis to prevent DVT.  Patient benefited maximally from hospital stay and there were no complications.    Recent vital signs: Patient Vitals for the past 24 hrs:  BP Temp Temp src Pulse Resp SpO2 Weight  08/23/23 0957 (!) 146/73 98 F (36.7 C) Oral 63 16 95 % --  08/23/23 0605 (!) 145/82 97.7 F (36.5 C) Oral 63 18 97 % --  08/23/23 0318 117/69 98.4 F (36.9 C) Oral 64 18 96 % --  08/22/23 2306 (!) 146/74 98.5 F (36.9 C) Oral 73 18 96 % --  08/22/23 2203 132/70 99 F (37.2 C) Oral 64 18 96 % --  08/22/23 2115 137/82 98.4 F (36.9 C) Oral 67 18 95 % --  08/22/23 2107 -- -- -- 76 18 97 % --  08/22/23 2011 (!) 148/84 98 F (36.7 C) Oral 76 18 97 % --  08/22/23 1945 126/75 -- -- -- 15 99 % --  08/22/23 1845 133/82 -- -- --  19 99 % --  08/22/23 1745 (!) 155/95 97.7 F (36.5 C) -- 62 19 96 % --  08/22/23 1730 (!) 131/98 -- -- (!) 56 18 93 % --  08/22/23 1715 (!) 144/97 -- -- (!) 51 14 93 % --  08/22/23 1700 (!) 141/92 -- -- (!) 54 14 98 % --  08/22/23 1645 -- -- -- 60 15 95 % --  08/22/23 1630 125/85 -- -- 61 14 95 % --  08/22/23 1624 (!) 147/79 97.8 F (36.6 C) -- 69 15 98 % --  08/22/23 1341 (!) 133/93 -- -- (!) 58 16 97 % --  08/22/23 1336 (!) 151/95 -- -- 60 14 96 % --  08/22/23 1219 -- -- -- -- -- -- 114.3 kg  08/22/23 1106 125/89 98.9 F (37.2 C) Oral (!) 55 19 96 % --     Recent laboratory studies:  Recent Labs    08/23/23 0545  WBC 19.8*  HGB 15.3  HCT 45.8  PLT 176  NA 135  K 3.7  CL 103  CO2 25  BUN 16  CREATININE 0.96  GLUCOSE 221*  CALCIUM 8.2*     Discharge  Medications:   Allergies as of 08/23/2023   No Known Allergies      Medication List     STOP taking these medications    aspirin  EC 81 MG tablet Replaced by: aspirin  81 MG chewable tablet       TAKE these medications    Armodafinil  150 MG tablet Commonly known as: Nuvigil  Take 1 tablet (150 mg total) by mouth daily.   aspirin  81 MG chewable tablet Chew 1 tablet (81 mg total) by mouth 2 (two) times daily. Replaces: aspirin  EC 81 MG tablet   CALCIUM 600/VITAMIN D PO Take 1 tablet by mouth daily.   carvedilol  12.5 MG tablet Commonly known as: COREG  Take 1 tablet (12.5 mg total) by mouth 2 (two) times daily with a meal.   cyanocobalamin  1000 MCG tablet Commonly known as: VITAMIN B12 Take 2,000 mcg by mouth daily.   losartan -hydrochlorothiazide  100-25 MG tablet Commonly known as: HYZAAR Take 1 tablet by mouth daily.   MAGNESIUM  PO Take 250 mg by mouth in the morning and at bedtime.   metFORMIN  500 MG 24 hr tablet Commonly known as: GLUCOPHAGE -XR Take 500 mg by mouth daily with supper.   methocarbamol  500 MG tablet Commonly known as: ROBAXIN  Take 1 tablet (500 mg total) by mouth every 6 (six) hours as needed for muscle spasms.   multivitamin with minerals Tabs tablet Take 1 tablet by mouth daily.   oxyCODONE  5 MG immediate release tablet Commonly known as: Oxy IR/ROXICODONE  Take 1-2 tablets (5-10 mg total) by mouth every 6 (six) hours as needed for moderate pain (pain score 4-6) (pain score 4-6).   testosterone  cypionate 200 MG/ML injection Commonly known as: DEPOTESTOSTERONE CYPIONATE Inject 200 mg into the muscle See admin instructions. Every 10 days   Trulicity 1.5 MG/0.5ML Soaj Generic drug: Dulaglutide Inject 1.5 mg into the skin once a week.               Durable Medical Equipment  (From admission, onward)           Start     Ordered   08/23/23 0345  DME 3 n 1  Once        08/23/23 0345   08/23/23 0345  DME Walker rolling  Once        Question Answer Comment  Walker: With 5 Inch Wheels   Patient needs a walker to treat with the following condition Status post total right knee replacement      08/23/23 0345            Diagnostic Studies: DG Knee Right Port Result Date: 08/22/2023 CLINICAL DATA:  Status post total right knee arthroplasty. EXAM: PORTABLE RIGHT KNEE - 1-2 VIEW COMPARISON:  Right knee radiographs 07/14/2023 FINDINGS: Interval total right knee arthroplasty. No perihardware lucency is seen to indicate hardware failure or loosening. Expected postoperative changes including intra-articular and subcutaneous air. Mild-to-moderatejoint effusion. Anterior surgical skin staples. No acute fracture or dislocation. IMPRESSION: Interval total right knee arthroplasty without evidence of hardware failure. Electronically Signed   By: Tanda Lyons M.D.   On: 08/22/2023 18:48   Home sleep test Result Date: 08/06/2023 Chalice Saunas, MD     08/10/2023  7:45 PM   [facility] Alexander [name] Nose 73 year old male [birth year]  HOME SLEEP TEST REPORT ( by [facility])  STUDY DATE: 08-07-2023  ( mail out test data load)  ORDERING CLINICIAN: Sherryl, NP REFERRING CLINICIAN: PCP  CLINICAL INFORMATION/HISTORY: 07/15/23:MM,NP Video visit  Alexander [name] is a 73 y.o. male with a history of OSA on CPAP and narcolepsy. Returns today for follow-up.  He reports that the CPAP is working well on 5-12 cm water , 2 cm EPR, 95% pressure of 11 cm water . The patient is in need of a new machine,  he continues to use Provigil  for narcolepsy however he states that he is finding that he is falling asleep during church and during conversations.  Reports that he does not have any trouble staying awake while driving.  He has only been on Provigil  for narcolepsy.  His download for CPAP is below. He remains sleepy , is on Modafinil .  Epworth sleepiness score: 17 /24.  BMI: x kg/m  Neck Circumference: x  FINDINGS:  Sleep Summary:  Total Recording Time  (hours, min):     6 hours 29 minutes  Total Sleep Time (hours, min):   5 hours 34 minutes            Percent REM (%):   12.7%                                   Respiratory Indices by AASM criteria:  Calculated pAHI (per hour):   52/h      with 11% central events.  Following CMS criteria the AHI is still severe at 45.3/h.                  REM pAHI:     47.7/h                                          NREM pAHI:   52.7/h                         Positional AHI: The vast majority of the sleep time was recorded in supine 330 minutes in total with an AHI of 51.5/h following AASM criteria and 35.3/h following CMS criteria. Snoring level reached a mean volume of 43 dB and snoring was present for two thirds of the total recorded sleep time.  Oxygen  Saturation Statistics:  Oxygen  Saturation (%) Mean:    94%         O2 Saturation Range (%):   Between 82 and a maximum of 99%                                  O2 Saturation (minutes) <89%:    10.3 minutes     Pulse Rate Statistics:  Pulse Mean (bpm):    61 bpm           Pulse Range:     Between 33 and 113 bpm.          IMPRESSION:  This HST confirms the presence of severe and complex sleep apnea but dominantly obstructive apnea.  This apnea is not REM sleep dependent. There is intermittent bradycardia noted and mild hypoxemia. A continuation of positive airway pressure therapy is indicated.  RECOMMENDATION: The new also titration capable CPAP device by [employer] will be set between 5 and 15 cm water  with 2 cm EPR, heated humidification and an interface to be fitted.  Please note facial hair.  INTERPRETING PHYSICIAN:  Dedra Gores, MD Certified in Neurology by ABPN Certified in Sleep Medicine by [facility] [address], [address] [place], [place] [address]    Disposition:      Follow-up Information     Vernetta Lonni GRADE, MD Follow up in 2 week(s).   Specialty: Orthopedic Surgery Contact  information: [address] [place]  [place] [address] [phone]                  Signed: BERTRUM [name] 08/23/2023, 10:50 AM

## 2023-08-23 NOTE — Evaluation (Signed)
 Physical Therapy Evaluation Patient Details Name: Alexander [name] MRN: [id number] DOB: 11-May-1951 Today's Date: 08/23/2023  History of Present Illness  73 yo male s/p R TKA on 08/22/23. PMH: R DA THA 2022, OA, HTN, bil CTR,  L DA THA 2021.  Clinical Impression  Pt is s/p TKA resulting in the deficits listed below (see PT Problem List).  Pt doing quite well, amb 120' with RW and CGA, no incr pain. Initiated TKA HEP. Will see again in pm and pt should be ready to d/c later today from PT standpoint  Pt will benefit from acute skilled PT to increase their independence and safety with mobility to allow discharge.          If plan is discharge home, recommend the following: Help with stairs or ramp for entrance;Assist for transportation   Can travel by private vehicle        Equipment Recommendations None recommended by PT  Recommendations for Other Services       Functional Status Assessment Patient has had a recent decline in their functional status and demonstrates the ability to make significant improvements in function in a reasonable and predictable amount of time.     Precautions / Restrictions Precautions Precautions: Fall;Knee Precaution Comments: IND SLR x5 Required Braces or Orthoses: Knee Immobilizer - Right Knee Immobilizer - Right: Discontinue once straight leg raise with < 10 degree lag Restrictions Weight Bearing Restrictions Per Provider Order: No Other Position/Activity Restrictions: WBAT      Mobility  Bed Mobility Overal bed mobility: Needs Assistance Bed Mobility: Supine to Sit     Supine to sit: Supervision     General bed mobility comments: for safety    Transfers Overall transfer level: Needs assistance Equipment used: Rolling walker (2 wheels) Transfers: Sit to/from Stand Sit to Stand: Supervision           General transfer comment: cues for hand and RLE position    Ambulation/Gait Ambulation/Gait assistance: Contact guard  assist Gait Distance (Feet): 120 Feet Assistive device: Rolling walker (2 wheels) Gait Pattern/deviations: Step-to pattern, Step-through pattern, Decreased stride length, Decreased weight shift to right       General Gait Details: cues for sequence and RW position  Stairs            Wheelchair Mobility     Tilt Bed    Modified Rankin (Stroke Patients Only)       Balance Overall balance assessment: Needs assistance Sitting-balance support: No upper extremity supported, Feet supported Sitting balance-Leahy Scale: Good     Standing balance support: Reliant on assistive device for balance, During functional activity Standing balance-Leahy Scale: Fair                               Pertinent Vitals/Pain Pain Assessment Pain Assessment: Faces Faces Pain Scale: No hurt Pain Location: R knee Pain Intervention(s): Limited activity within patient's tolerance, Monitored during session, Premedicated before session, Repositioned    Home Living Family/patient expects to be discharged to:: Private residence Living Arrangements: Spouse/significant other Available Help at Discharge: Family Type of Home: House Home Access: Stairs to enter Entrance Stairs-Rails: Right Entrance Stairs-Number of Steps: 2   Home Layout: One level;Laundry or work area in [employer] Equipment: Agricultural Consultant (2 wheels);BSC/3in1 Additional Comments: pt will not be allowed to go to basement per spouse    Prior Function Prior Level of Function : Independent/Modified Independent  Extremity/Trunk Assessment   Upper Extremity Assessment Upper Extremity Assessment: Overall WFL for tasks assessed    Lower Extremity Assessment Lower Extremity Assessment: RLE deficits/detail RLE Deficits / Details: ankle WFL, knee extension and hip flexion 3/5       Communication      Cognition                                                 General Comments      Exercises Total Joint Exercises Ankle Circles/Pumps: AROM, Both, 10 reps Quad Sets: AROM, Both, 5 reps Heel Slides: AAROM, Right (6 reps) Straight Leg Raises: Right, 5 reps   Assessment/Plan    PT Assessment Patient needs continued PT services  PT Problem List Decreased strength;Decreased range of motion;Decreased activity tolerance;Decreased mobility;Decreased balance;Decreased knowledge of use of DME;Decreased knowledge of precautions       PT Treatment Interventions DME instruction;Gait training;Functional mobility training;Therapeutic activities;Therapeutic exercise;Patient/family education;Stair training    PT Goals (Current goals can be found in the Care Plan section)  Acute Rehab PT Goals PT Goal Formulation: With patient Time For Goal Achievement: 08/30/23 Potential to Achieve Goals: Good    Frequency 7X/week     Co-evaluation               AM-PAC PT 6 Clicks Mobility  Outcome Measure Help needed turning from your back to your side while in a flat bed without using bedrails?: A Little Help needed moving from lying on your back to sitting on the side of a flat bed without using bedrails?: A Little Help needed moving to and from a bed to a chair (including a wheelchair)?: A Little Help needed standing up from a chair using your arms (e.g., wheelchair or bedside chair)?: A Little Help needed to walk in hospital room?: A Little Help needed climbing 3-5 steps with a railing? : A Little 6 Click Score: 18    End of Session Equipment Utilized During Treatment: Gait belt Activity Tolerance: Patient tolerated treatment well Patient left: with call bell/phone within reach;in chair;with family/visitor present (no alarm box in room) Nurse Communication: Mobility status PT Visit Diagnosis: Other abnormalities of gait and mobility (R26.89)    Time: 1000-1035 PT Time Calculation (min) (ACUTE ONLY): 35 min   Charges:   PT Evaluation $PT Eval  Low Complexity: 1 Low PT Treatments $Gait Training: 8-22 mins PT General Charges $$ ACUTE PT VISIT: 1 Visit         Trevan Messman, PT  Acute Rehab Dept [facility]) [phone]  08/23/2023   [facility] 08/23/2023, 11:19 AM

## 2023-08-23 NOTE — Progress Notes (Signed)
 Subjective: 1 Day Post-Op Procedure(s) (LRB): RIGHT TOTAL KNEE ARTHROPLASTY (Right) Patient reports pain as mild.  No complaints wants to discharge home today.   Objective: Vital signs in last 24 hours: Temp:  [97.7 F (36.5 C)-99 F (37.2 C)] 98 F (36.7 C) (01/04 0957) Pulse Rate:  [51-76] 63 (01/04 0957) Resp:  [14-19] 16 (01/04 0957) BP: (117-155)/(69-98) 146/73 (01/04 0957) SpO2:  [93 %-99 %] 95 % (01/04 0957) FiO2 (%):  [21 %] 21 % (01/03 2107) Weight:  [114.3 kg] 114.3 kg (01/03 1219)  Intake/Output from previous day: 01/03 0701 - 01/04 0700 In: 2720.2 [P.O.:1080; I.V.:1240.2; IV Piggyback:400] Out: 8104 [Lmpwz:8204; Blood:100] Intake/Output this shift: Total I/O In: -  Out: 300 [Urine:300]  Recent Labs    08/23/23 0545  HGB 15.3   Recent Labs    08/23/23 0545  WBC 19.8*  RBC 5.05  HCT 45.8  PLT 176   Recent Labs    08/23/23 0545  NA 135  K 3.7  CL 103  CO2 25  BUN 16  CREATININE 0.96  GLUCOSE 221*  CALCIUM 8.2*   No results for input(s): LABPT, INR in the last 72 hours.  Dorsiflexion/Plantar flexion intact Incision: scant drainage Compartment soft   Assessment/Plan: 1 Day Post-Op Procedure(s) (LRB): RIGHT TOTAL KNEE ARTHROPLASTY (Right) Up with therapy Discharge to home if patient does well with PT.       Alexander [name] 08/23/2023, 10:38 AM

## 2023-08-25 ENCOUNTER — Encounter (HOSPITAL_COMMUNITY): Payer: Self-pay | Admitting: Orthopaedic Surgery

## 2023-09-01 ENCOUNTER — Telehealth: Payer: Self-pay | Admitting: Orthopaedic Surgery

## 2023-09-01 ENCOUNTER — Other Ambulatory Visit: Payer: Self-pay | Admitting: Orthopaedic Surgery

## 2023-09-01 MED ORDER — OXYCODONE HCL 5 MG PO TABS: 5.0000 mg | ORAL_TABLET | Freq: Four times a day (QID) | ORAL | 0 refills | Status: DC | PRN

## 2023-09-01 NOTE — Telephone Encounter (Signed)
 Pt called requesting a refill of oxycodone. Pt states he ran out Friday. Please send refill to [facility] [address]. Pt phone number is [phone]

## 2023-09-04 ENCOUNTER — Ambulatory Visit: Payer: Medicare Other | Admitting: Orthopaedic Surgery

## 2023-09-04 ENCOUNTER — Encounter: Payer: Self-pay | Admitting: Orthopaedic Surgery

## 2023-09-04 DIAGNOSIS — Z96651 Presence of right artificial knee joint: Secondary | ICD-10-CM

## 2023-09-04 MED ORDER — OXYCODONE HCL 5 MG PO TABS: 5.0000 mg | ORAL_TABLET | Freq: Four times a day (QID) | ORAL | 0 refills | Status: AC | PRN

## 2023-09-04 MED ORDER — DOXYCYCLINE HYCLATE 100 MG PO TABS: 100.0000 mg | ORAL_TABLET | Freq: Two times a day (BID) | ORAL | 0 refills | Status: AC

## 2023-09-04 NOTE — Progress Notes (Signed)
The patient is here for his first postoperative visit status post a right total knee replacement.  We have replaced the hip on him before.  He has been compliant with a baby aspirin twice a day.  He has been working with bending his knee to home health therapy.  On exam today the staples are removed and Steri-Strips applied.  There is moderate swelling to be expected.  His calf is soft.  There is a little bit of red hue on the shin so I would like to start him on doxycycline twice daily this is a precaution.  His extension is close to full but I can only flex him to about 60 degrees.  He starts outpatient physical therapy tomorrow.  I will send in more oxycodone.  He does have Robaxin.  I will also again send in doxycycline.  If the redness worsens they will let us know.  If not we will see him back in a month to see how his range of motion is coming along.  No x-rays are needed at that visit.

## 2023-09-05 ENCOUNTER — Ambulatory Visit (INDEPENDENT_AMBULATORY_CARE_PROVIDER_SITE_OTHER): Payer: Medicare Other | Admitting: Physical Therapy

## 2023-09-05 ENCOUNTER — Encounter: Payer: Self-pay | Admitting: Physical Therapy

## 2023-09-05 ENCOUNTER — Other Ambulatory Visit: Payer: Self-pay

## 2023-09-05 DIAGNOSIS — R600 Localized edema: Secondary | ICD-10-CM | POA: Diagnosis not present

## 2023-09-05 DIAGNOSIS — M25661 Stiffness of right knee, not elsewhere classified: Secondary | ICD-10-CM

## 2023-09-05 DIAGNOSIS — M6281 Muscle weakness (generalized): Secondary | ICD-10-CM

## 2023-09-05 DIAGNOSIS — M25561 Pain in right knee: Secondary | ICD-10-CM | POA: Diagnosis not present

## 2023-09-05 DIAGNOSIS — R262 Difficulty in walking, not elsewhere classified: Secondary | ICD-10-CM | POA: Diagnosis not present

## 2023-09-05 NOTE — Therapy (Signed)
OUTPATIENT PHYSICAL THERAPY LOWER EXTREMITY EVALUATION   Patient Name: Alexander [name] MRN: [id number] DOB:[birth year], 73 y.o., male Today's Date: 09/05/2023  END OF SESSION:  PT End of Session - 09/05/23 1358     Visit Number 1    Number of Visits 17    Date for PT Re-Evaluation 10/31/23    Authorization Type MCR/Tricare    Authorization Time Period 09/05/23 to 10/31/23    Progress Note Due on Visit 10    PT Start Time 1303    PT Stop Time 1344    PT Time Calculation (min) 41 min    Activity Tolerance Patient tolerated treatment well    Behavior During Therapy [facility] for tasks assessed/performed             Past Medical History:  Diagnosis Date   Allergy    Cancer (HCC)    skin cancer to upper chest/basal cell 2007   Cardiomyopathy [facility])    Chronic neck pain    DJD   Chronic pain in right foot    Complication of anesthesia    slow to wake up after quadraceps surgery at [facility]   DDD (degenerative disc disease), lumbar    Depression    Dyslipidemia    Erectile dysfunction    Exogenous obesity    GERD (gastroesophageal reflux disease)    H/O acquired cardiomyopathy 1996   RESOLVED (apparently was thought to have had an MI, but did not have any intervention).  He developed cardiomyopathy that forced him to retire from [employer]..  The current EF 60-65%.   Heart murmur    History of fracture due to fall 1988   Bilateral wrist fractures   Hyperlipidemia    Hypertension    Hypogonadism male    IBS (irritable bowel syndrome)    Obstructive sleep apnea    cpap since 1999   Pre-diabetes    on Metformin DAily with supper   Past Surgical History:  Procedure Laterality Date   CARDIAC CATHETERIZATION  2000   Nonobstructive disease.  Persistently reduced EF   CARPAL TUNNEL RELEASE Left    CARPAL TUNNEL RELEASE Right 01/05/2019   Procedure: RIGHT CARPAL TUNNEL RELEASE;  Surgeon: Betha Loa, MD;  Location:  SURGERY CENTER;  Service: Orthopedics;   Laterality: Right;  Bier block   COLONOSCOPY     CORONARY CT ANGIOGRAM  07/2017   Coronary calcium score 0?.  Focal noncalcific plaque (30%) in proximal LAD.  Otherwise no significant CAD.  Normal aortic root (3.2 cm)   HAMMER TOE SURGERY Bilateral    ingrown toenail surgery Bilateral    2017   IR RADIOLOGY PERIPHERAL GUIDED IV START  07/25/2017   IR US GUIDE VASC ACCESS RIGHT  07/25/2017   MENISCUS REPAIR Right    2016   ORIF WRIST FRACTURE Bilateral    PLANTAR FASCIA RELEASE Right    POLYPECTOMY     SHOULDER ARTHROSCOPY Left    TENDON REPAIR Right    right quad tendon repair   Tilt Table Test     Noted to have vasovagal syncope   TOTAL HIP ARTHROPLASTY Left 12/08/2020   Procedure: LEFT TOTAL HIP ARTHROPLASTY ANTERIOR APPROACH;  Surgeon: Kathryne Hitch, MD;  Location: [facility];  Service: Orthopedics;  Laterality: Left;  Needs RNFA   TOTAL HIP ARTHROPLASTY Right 03/30/2021   Procedure: RIGHT TOTAL HIP ARTHROPLASTY ANTERIOR APPROACH;  Surgeon: Kathryne Hitch, MD;  Location: [facility];  Service: Orthopedics;  Laterality: Right;  TOTAL KNEE ARTHROPLASTY Right 08/22/2023   Procedure: RIGHT TOTAL KNEE ARTHROPLASTY;  Surgeon: Kathryne Hitch, MD;  Location: [facility];  Service: Orthopedics;  Laterality: Right;   TRANSTHORACIC ECHOCARDIOGRAM  06/21/2015   Normal LV function.  EF 57%.  Mild left atrial enlargement, trace AI, trace MR, mild PI.   TRANSTHORACIC ECHOCARDIOGRAM  06/2017   EF 60-65%.  Mild diastolic dysfunction.  No significant valvular abnormality.  Mild aortic calcification.   WISDOM TOOTH EXTRACTION Bilateral    Patient Active Problem List   Diagnosis Date Noted   Status post total right knee replacement 08/22/2023   OSA on CPAP 08/10/2023   Status post total replacement of right hip 03/30/2021   Hyperlipidemia due to dietary fat intake 10/01/2019   Morbid obesity (HCC) 10/01/2019   Excessive daytime sleepiness 03/03/2018   Obstructive sleep apnea  treated with bilevel positive airway pressure (BiPAP) 03/03/2018   Sleep paralysis, recurrent isolated 03/03/2018   Cataplexy 03/03/2018   Abnormal dreams 03/03/2018   Breathholding 03/03/2018   Hypersomnia, persistent 09/03/2017   Exertional dyspnea 06/20/2017   Vasovagal syncope 06/20/2017   Essential hypertension 06/20/2017   History of cardiomyopathy 06/20/2017    PCP: Jarome Matin MD   REFERRING PROVIDER: Kathryne Hitch, MD  REFERRING DIAG: Diagnosis M17.11 (ICD-10-CM) - Unilateral primary osteoarthritis, right knee  THERAPY DIAG:  Stiffness of right knee, not elsewhere classified  Acute pain of right knee  Muscle weakness (generalized)  Difficulty in walking, not elsewhere classified  Localized edema  Rationale for Evaluation and Treatment: Rehabilitation  ONSET DATE: TKR surgery 08/22/23  SUBJECTIVE:   SUBJECTIVE STATEMENT:  Things have been going well since surgery, they told me that the TKR would be different and harder than the hip surgeries. I did tear my quad about 8 years ago, had to have surgery for it. Just had the staples removed yesterday, but I don't feel that I was where I should have been with ROM with HHPT (finished/DCed HHPT yesterday). Stiffness is a big concern. Might be getting a bit of sciatic pain from sitting so much more than normal.  PERTINENT HISTORY: See above  PAIN:  Are you having pain? Yes: NPRS scale: 5-6/10 now, at worst can get to 10/10 Pain location: R knee  Pain description: tightness Aggravating factors: not walking, sitting for too long   Relieving factors: movement/walking   PRECAUTIONS: None  RED FLAGS: None   WEIGHT BEARING RESTRICTIONS: No  FALLS:  Has patient fallen in last 6 months? 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    PATIENT SURVEYS:  FOTO 38, predicted 55 in 12 visits   COGNITION: Overall cognitive status: Within functional limits for tasks assessed     SENSATION: Not tested  EDEMA:  Localized edema appropriate for post-op state    LOWER EXTREMITY ROM:  Active ROM Right eval Left eval  Hip flexion    Hip extension    Hip abduction    Hip adduction    Hip internal rotation    Hip external rotation    Knee flexion Seated edge of mat table 62*  Supine AROM 60*, AAROM 63*    Knee extension Seated LAQ 12*   Supine heel prop/quad set   8*   Ankle dorsiflexion    Ankle plantarflexion    Ankle inversion    Ankle eversion     (Blank rows = not tested)  LOWER EXTREMITY MMT:  MMT Right eval Left eval  Hip flexion    Hip extension    Hip abduction    Hip adduction    Hip internal rotation    Hip external rotation    Knee flexion 4 5  Knee extension 4+ 5  Ankle dorsiflexion    Ankle plantarflexion    Ankle inversion    Ankle eversion     (Blank rows = not tested)    FUNCTIONAL TESTS:   5xSTS 24 seconds UEs on chair   GAIT: Distance walked: in clinic distances  Assistive device utilized: Environmental consultant - 2 wheeled Level of assistance: Modified independence Comments: antalgic, favors R LE, limited R knee ROM with gait cycle                                                                                                                                 TREATMENT DATE:   09/05/23  Exam  findings, POC, HEP  Nustep L3 x10 minutes for ROM Education on general expectations for recovery s/p TKR, ice routine, additional exercises at home /HEP additions    PATIENT EDUCATION:  Education details: exam findings, POC, HEP  Person educated: Patient Education method: Programmer, multimedia, Demonstration, and Handouts Education comprehension: verbalized understanding, returned demonstration, and needs further education  HOME EXERCISE PROGRAM: Access Code: [email or web address] URL: [email or web address] Date: 09/05/2023 Prepared by: Nedra Hai  Exercises - Seated Knee Flexion Extension AAROM with Overpressure  - 3 x daily - 7 x weekly - 1 sets - 10-15 reps - 5 seconds  hold - Seated Passive Knee Extension  - 3 x daily - 7 x weekly - 1 sets - 1 reps - 3-5 minutes  hold - Seated Small Alternating Straight Leg Lifts  - 3 x daily - 7 x weekly - 1 sets - 10-15 reps  ASSESSMENT:  CLINICAL IMPRESSION: Patient is a 73 y.o. M who was seen today for physical therapy evaluation and treatment for skilled PT care s/p TKR. Exam as typical and expected, his knee is very stiff and hypomobile. Will benefit from skilled PT services to address all functional concerns and pain, and assist in return to optimal level of function.    OBJECTIVE IMPAIRMENTS: Abnormal gait, decreased activity tolerance, decreased balance, decreased knowledge of use of DME, decreased mobility, difficulty walking, decreased ROM, decreased strength, hypomobility, increased edema, increased fascial restrictions, and pain.   ACTIVITY  LIMITATIONS: sitting, standing, squatting, sleeping, stairs, transfers, and locomotion level  PARTICIPATION LIMITATIONS: driving, shopping, community activity, occupation, and yard work  PERSONAL FACTORS: Age, Education, Financial risk analyst, Past/current experiences, Profession, Social background, and Time since onset of injury/illness/exacerbation are also affecting patient's functional outcome.   REHAB  POTENTIAL: Good  CLINICAL DECISION MAKING: Stable/uncomplicated  EVALUATION COMPLEXITY: Low   GOALS: Goals reviewed with patient? Yes  SHORT TERM GOALS: Target date: 10/03/2023   Will be complaint with appropriate progressive HEP  Baseline: Goal status: INITIAL  2.  Surgical knee extension AROM to be less than 5* Baseline:  Goal status: INITIAL  3.  Surgical knee flexion AROM to be at least 90* Baseline:  Goal status: INITIAL  4.  Will be independent with edema management techniques  Baseline:  Goal status: INITIAL    LONG TERM GOALS: Target date: 10/31/2023    Surgical knee flexion AROM to be at least 105* Baseline:  Goal status: INITIAL  2.  MMT to have improved by one grade in all weak groups  Baseline:  Goal status: INITIAL  3.  Will be able to ambulate community distances without device and no increased pain to improve QOL  Baseline:  Goal status: INITIAL  4.  Will be able to ascend/descend flight of steps with rail with reciprocal pattern/no increased pain  Baseline:  Goal status: INITIAL  5.  Pain in surgical knee to be no more than 5/10 at worst  Baseline:  Goal status: INITIAL  6.  FOTO score to be within 5 points of predicted value by time of DC  Baseline:  Goal status: INITIAL   PLAN:  PT FREQUENCY: 2x/week  PT DURATION: 8 weeks  PLANNED INTERVENTIONS: 97164- PT Re-evaluation, 97110-Therapeutic exercises, 97530- Therapeutic activity, 97112- Neuromuscular re-education, 97535- Self Care, 21308- Manual therapy, [id number]- Gait training, [phone]- Orthotic Fit/training, [id number]- Aquatic Therapy, 97014- Electrical stimulation (unattended), [id number]- Ultrasound, [id number]- Ionotophoresis 4mg /ml Dexamethasone, Taping, Dry Needling, Cryotherapy, and Moist heat  PLAN FOR NEXT SESSION: aggressive ROM and manual as appropriate to improve knee flexion/extension   Nedra Hai, PT, DPT 09/05/23 2:00 PM

## 2023-09-08 ENCOUNTER — Encounter: Payer: Self-pay | Admitting: Physical Therapy

## 2023-09-08 ENCOUNTER — Ambulatory Visit (INDEPENDENT_AMBULATORY_CARE_PROVIDER_SITE_OTHER): Payer: Medicare Other | Admitting: Physical Therapy

## 2023-09-08 DIAGNOSIS — M25661 Stiffness of right knee, not elsewhere classified: Secondary | ICD-10-CM

## 2023-09-08 DIAGNOSIS — H35033 Hypertensive retinopathy, bilateral: Secondary | ICD-10-CM | POA: Diagnosis not present

## 2023-09-08 DIAGNOSIS — M25561 Pain in right knee: Secondary | ICD-10-CM

## 2023-09-08 DIAGNOSIS — R600 Localized edema: Secondary | ICD-10-CM | POA: Diagnosis not present

## 2023-09-08 DIAGNOSIS — H04123 Dry eye syndrome of bilateral lacrimal glands: Secondary | ICD-10-CM | POA: Diagnosis not present

## 2023-09-08 DIAGNOSIS — R262 Difficulty in walking, not elsewhere classified: Secondary | ICD-10-CM

## 2023-09-08 DIAGNOSIS — M6281 Muscle weakness (generalized): Secondary | ICD-10-CM

## 2023-09-08 DIAGNOSIS — H35363 Drusen (degenerative) of macula, bilateral: Secondary | ICD-10-CM | POA: Diagnosis not present

## 2023-09-08 NOTE — Therapy (Signed)
OUTPATIENT PHYSICAL THERAPY LOWER EXTREMITY TREATMENT   Patient Name: Alexander [name] MRN: [id number] DOB:1950-09-25, 73 y.o., male Today's Date: 09/08/2023  END OF SESSION:  PT End of Session - 09/08/23 0856     Visit Number 2    Number of Visits 17    Date for PT Re-Evaluation 10/31/23    Authorization Type MCR/Tricare    Authorization Time Period 09/05/23 to 10/31/23    Progress Note Due on Visit 10    PT Start Time 0847    PT Stop Time 0930    PT Time Calculation (min) 43 min    Activity Tolerance Patient tolerated treatment well    Behavior During Therapy [facility] for tasks assessed/performed              Past Medical History:  Diagnosis Date   Allergy    Cancer (HCC)    skin cancer to upper chest/basal cell 2007   Cardiomyopathy [facility])    Chronic neck pain    DJD   Chronic pain in right foot    Complication of anesthesia    slow to wake up after quadraceps surgery at [facility].   DDD (degenerative disc disease), lumbar    Depression    Dyslipidemia    Erectile dysfunction    Exogenous obesity    GERD (gastroesophageal reflux disease)    H/O acquired cardiomyopathy 1996   RESOLVED (apparently was thought to have had an MI, but did not have any intervention).  He developed cardiomyopathy that forced him to retire from [employer]..  The current EF 60-65%.   Heart murmur    History of fracture due to fall 1988   Bilateral wrist fractures   Hyperlipidemia    Hypertension    Hypogonadism male    IBS (irritable bowel syndrome)    Obstructive sleep apnea    cpap since 1999   Pre-diabetes    on Metformin DAily with supper   Past Surgical History:  Procedure Laterality Date   CARDIAC CATHETERIZATION  2000   Nonobstructive disease.  Persistently reduced EF   CARPAL TUNNEL RELEASE Left    CARPAL TUNNEL RELEASE Right 01/05/2019   Procedure: RIGHT CARPAL TUNNEL RELEASE;  Surgeon: Betha Loa, MD;  Location: [facility];  Service: Orthopedics;   Laterality: Right;  Bier block   COLONOSCOPY     CORONARY CT ANGIOGRAM  07/2017   Coronary calcium score 0?.  Focal noncalcific plaque (30%) in proximal LAD.  Otherwise no significant CAD.  Normal aortic root (3.2 cm)   HAMMER TOE SURGERY Bilateral    ingrown toenail surgery Bilateral    2017   IR RADIOLOGY PERIPHERAL GUIDED IV START  07/25/2017   IR US GUIDE VASC ACCESS RIGHT  07/25/2017   MENISCUS REPAIR Right    2016   ORIF WRIST FRACTURE Bilateral    PLANTAR FASCIA RELEASE Right    POLYPECTOMY     SHOULDER ARTHROSCOPY Left    TENDON REPAIR Right    right quad tendon repair   Tilt Table Test     Noted to have vasovagal syncope   TOTAL HIP ARTHROPLASTY Left 12/08/2020   Procedure: LEFT TOTAL HIP ARTHROPLASTY ANTERIOR APPROACH;  Surgeon: Kathryne Hitch, MD;  [facility];  Service: Orthopedics;  Laterality: Left;  Needs RNFA   TOTAL HIP ARTHROPLASTY Right 03/30/2021   Procedure: RIGHT TOTAL HIP ARTHROPLASTY ANTERIOR APPROACH;  Surgeon: Kathryne Hitch, MD;  Location: [facility];  Service: Orthopedics;  Laterality: Right;  TOTAL KNEE ARTHROPLASTY Right 08/22/2023   Procedure: RIGHT TOTAL KNEE ARTHROPLASTY;  Surgeon: Kathryne Hitch, MD;  [facility];  Service: Orthopedics;  Laterality: Right;   TRANSTHORACIC ECHOCARDIOGRAM  06/21/2015   Normal LV function.  EF 57%.  Mild left atrial enlargement, trace AI, trace MR, mild PI.   TRANSTHORACIC ECHOCARDIOGRAM  06/2017   EF 60-65%.  Mild diastolic dysfunction.  No significant valvular abnormality.  Mild aortic calcification.   WISDOM TOOTH EXTRACTION Bilateral    Patient Active Problem List   Diagnosis Date Noted   Status post total right knee replacement 08/22/2023   OSA on CPAP 08/10/2023   Status post total replacement of right hip 03/30/2021   Hyperlipidemia due to dietary fat intake 10/01/2019   Morbid obesity (HCC) 10/01/2019   Excessive daytime sleepiness 03/03/2018   Obstructive sleep apnea  treated with bilevel positive airway pressure (BiPAP) 03/03/2018   Sleep paralysis, recurrent isolated 03/03/2018   Cataplexy 03/03/2018   Abnormal dreams 03/03/2018   Breathholding 03/03/2018   Hypersomnia, persistent 09/03/2017   Exertional dyspnea 06/20/2017   Vasovagal syncope 06/20/2017   Essential hypertension 06/20/2017   History of cardiomyopathy 06/20/2017    PCP: Jarome Matin MD   REFERRING PROVIDER: Kathryne Hitch, MD  REFERRING DIAG: Diagnosis M17.11 (ICD-10-CM) - Unilateral primary osteoarthritis, right knee  THERAPY DIAG:  Acute pain of right knee  Muscle weakness (generalized)  Difficulty in walking, not elsewhere classified  Stiffness of right knee, not elsewhere classified  Localized edema  Rationale for Evaluation and Treatment: Rehabilitation  ONSET DATE: TKR surgery 08/22/23  SUBJECTIVE:   SUBJECTIVE STATEMENT:  I didn't feel too bad but I did take an oxy this morning, trying to stay ahead of pain especially for therapy. I did the exercises you gave me. IT band is bothering me      EVAL: Things have been going well since surgery, they told me that the TKR would be different and harder than the hip surgeries. I did tear my quad about 8 years ago, had to have surgery for it. Just had the staples removed yesterday, but I don't feel that I was where I should have been with ROM with HHPT (finished/DCed HHPT yesterday). Stiffness is a big concern. Might be getting a bit of sciatic pain from sitting so much more than normal.  PERTINENT HISTORY: See above  PAIN:  Are you having pain? Yes: NPRS scale: 4/10 now  Pain location: R knee and ITB  Pain description: tightness Aggravating factors: not walking, sitting for too long   Relieving factors: movement/walking   PRECAUTIONS: None  RED FLAGS: None   WEIGHT BEARING RESTRICTIONS: No  FALLS:  Has patient fallen in last 6 months? 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    PATIENT SURVEYS:  FOTO 38, predicted 55 in 12 visits   COGNITION: Overall  cognitive status: Within functional limits for tasks assessed     SENSATION: Not tested  EDEMA:  Localized edema appropriate for post-op state    LOWER EXTREMITY ROM:  Active ROM Right eval Left eval  Hip flexion    Hip extension    Hip abduction    Hip adduction    Hip internal rotation    Hip external rotation    Knee flexion Seated edge of mat table 62*  Supine AROM 60*, AAROM 63*    Knee extension Seated LAQ 12*   Supine heel prop/quad set   8*   Ankle dorsiflexion    Ankle plantarflexion    Ankle inversion    Ankle eversion     (Blank rows = not tested)  LOWER EXTREMITY MMT:  MMT Right eval Left eval  Hip flexion    Hip extension    Hip abduction    Hip adduction    Hip internal rotation    Hip external rotation    Knee flexion 4 5  Knee extension 4+ 5  Ankle dorsiflexion    Ankle plantarflexion    Ankle inversion    Ankle eversion     (Blank rows = not tested)    FUNCTIONAL TESTS:   5xSTS 24 seconds UEs on chair   GAIT: Distance walked: in clinic distances  Assistive device utilized: Environmental consultant - 2 wheeled Level of assistance: Modified independence Comments: antalgic, favors R LE, limited R knee ROM with gait cycle                                                                                                                                  TREATMENT DATE:   09/08/23  TherEx  Knee flexion AAROM 15x5 seconds Nustep x10 minutes L3 all four extremities for ROM holds into flexion SAQs 2# 15x3 second holds RLE  SLRs 0# limited range x15 R LE   Manual  Percussion gun lateral R thigh and R quad       09/05/23  Exam findings, POC, HEP  Nustep L3 x10 minutes for ROM Education on general expectations for recovery s/p TKR, ice routine, additional exercises at home /HEP additions    PATIENT EDUCATION:  Education details: exam findings, POC, HEP  Person educated: Patient Education method: Programmer, multimedia, Demonstration, and Handouts Education comprehension: verbalized understanding, returned demonstration, and needs further education  HOME EXERCISE PROGRAM:  Access Code: [email or web address] URL: [email or web address] Date: 09/08/2023 Prepared by: Nedra Hai  Exercises - Seated Knee Flexion Extension AAROM with Overpressure  - 3 x daily - 7 x weekly - 1 sets - 10-15 reps - 5 seconds  hold - Seated Passive Knee Extension  - 3 x daily - 7 x weekly - 1 sets - 1 reps - 3-5 minutes  hold - Seated Small Alternating Straight Leg Lifts  - 3 x daily - 7 x weekly - 1 sets - 10-15 reps - Short Arc Quad with Ankle  Weight  - 3 x daily - 7 x weekly - 1 sets - 10 reps - 3 seconds  hold  ASSESSMENT:  CLINICAL IMPRESSION:   Pt arrives today doing well, still having ongoing pain and stiffness as expected this soon after TKR surgery. We continued working on functional ROM and strengthening as appropriate, also introduced percussion gun to lateral thigh and quad. Discussed use of tennis ball at home for targeted massage to quad/lateral thigh. Will continue to progress as appropriate.     EVAL: Patient is a 73 y.o. M who was seen today for physical therapy evaluation and treatment for skilled PT care  s/p TKR. Exam as typical and expected, his knee is very stiff and hypomobile. Will benefit from skilled PT services to address all functional concerns and pain, and assist in return to optimal level of function.    OBJECTIVE IMPAIRMENTS: Abnormal gait, decreased activity tolerance, decreased balance, decreased knowledge of use of DME, decreased mobility, difficulty walking, decreased ROM, decreased strength, hypomobility, increased edema, increased fascial restrictions, and pain.   ACTIVITY LIMITATIONS: sitting, standing, squatting, sleeping, stairs, transfers, and locomotion level  PARTICIPATION LIMITATIONS: driving, shopping, community activity, occupation, and yard work  PERSONAL FACTORS: Age, Education, Financial risk analyst, Past/current experiences, Profession, Social background, and Time since onset of injury/illness/exacerbation are also affecting patient's functional outcome.   REHAB POTENTIAL: Good  CLINICAL DECISION MAKING: Stable/uncomplicated  EVALUATION COMPLEXITY: Low   GOALS: Goals reviewed with patient? Yes  SHORT TERM GOALS: Target date: 10/03/2023   Will be complaint with appropriate progressive HEP  Baseline: Goal status: INITIAL  2.  Surgical knee extension AROM to be less than 5* Baseline:  Goal status: INITIAL  3.  Surgical knee flexion AROM to be at least 90* Baseline:  Goal status: INITIAL  4.  Will be independent with edema management techniques  Baseline:  Goal status: INITIAL    LONG TERM GOALS: Target date: 10/31/2023    Surgical knee flexion AROM to be at least 105* Baseline:  Goal status: INITIAL  2.  MMT to have improved by one grade in all weak groups  Baseline:  Goal status: INITIAL  3.  Will be able to ambulate community distances without device and no increased pain to improve QOL  Baseline:  Goal status: INITIAL  4.  Will be able to ascend/descend flight of steps with rail with reciprocal pattern/no increased pain  Baseline:  Goal status:  INITIAL  5.  Pain in surgical knee to be no more than 5/10 at worst  Baseline:  Goal status: INITIAL  6.  FOTO score to be within 5 points of predicted value by time of DC  Baseline:  Goal status: INITIAL   PLAN:  PT FREQUENCY: 2x/week  PT DURATION: 8 weeks  PLANNED INTERVENTIONS: 97164- PT Re-evaluation, 97110-Therapeutic exercises, 97530- Therapeutic activity, 97112- Neuromuscular re-education, 97535- Self Care, 29528- Manual therapy, [id number]- Gait training, [phone]- Orthotic Fit/training, [id number]- Aquatic Therapy, 97014- Electrical stimulation (unattended), [id number]- Ultrasound, [id number]- Ionotophoresis 4mg /ml Dexamethasone, Taping, Dry Needling, Cryotherapy, and Moist heat  PLAN FOR NEXT SESSION: aggressive ROM and manual as appropriate to improve knee flexion/extension, did he like percussion gun?   Nedra Hai, PT, DPT 09/08/23 9:37 AM

## 2023-09-10 ENCOUNTER — Encounter: Payer: Self-pay | Admitting: Physical Therapy

## 2023-09-10 ENCOUNTER — Ambulatory Visit (INDEPENDENT_AMBULATORY_CARE_PROVIDER_SITE_OTHER): Payer: Medicare Other | Admitting: Physical Therapy

## 2023-09-10 DIAGNOSIS — M25661 Stiffness of right knee, not elsewhere classified: Secondary | ICD-10-CM

## 2023-09-10 DIAGNOSIS — M25561 Pain in right knee: Secondary | ICD-10-CM

## 2023-09-10 DIAGNOSIS — R600 Localized edema: Secondary | ICD-10-CM | POA: Diagnosis not present

## 2023-09-10 DIAGNOSIS — R262 Difficulty in walking, not elsewhere classified: Secondary | ICD-10-CM

## 2023-09-10 DIAGNOSIS — M6281 Muscle weakness (generalized): Secondary | ICD-10-CM | POA: Diagnosis not present

## 2023-09-10 DIAGNOSIS — R293 Abnormal posture: Secondary | ICD-10-CM | POA: Diagnosis not present

## 2023-09-10 NOTE — Therapy (Addendum)
OUTPATIENT PHYSICAL THERAPY LOWER EXTREMITY TREATMENT   Patient Name: Alexander [name] MRN: [id number] DOB:1951/08/11, 73 y.o., male Today's Date: 09/10/2023  END OF SESSION:  PT End of Session - 09/10/23 1146     Visit Number 3    Number of Visits 17    Date for PT Re-Evaluation 10/31/23    Authorization Type MCR/[employer]    Authorization Time Period 09/05/23 to 10/31/23    Progress Note Due on Visit 10    PT Start Time 1146    PT Stop Time 1242    PT Time Calculation (min) 56 min    Equipment Utilized During Treatment Gait belt             Past Medical History:  Diagnosis Date   Allergy    Cancer (HCC)    skin cancer to upper chest/basal cell 2007   Cardiomyopathy (HCC)    Chronic neck pain    DJD   Chronic pain in right foot    Complication of anesthesia    slow to wake up after quadraceps surgery at [facility].   DDD (degenerative disc disease), lumbar    Depression    Dyslipidemia    Erectile dysfunction    Exogenous obesity    GERD (gastroesophageal reflux disease)    H/O acquired cardiomyopathy 1996   RESOLVED (apparently was thought to have had an MI, but did not have any intervention).  He developed cardiomyopathy that forced him to retire from [employer]..  The current EF 60-65%.   Heart murmur    History of fracture due to fall 1988   Bilateral wrist fractures   Hyperlipidemia    Hypertension    Hypogonadism male    IBS (irritable bowel syndrome)    Obstructive sleep apnea    cpap since 1999   Pre-diabetes    on Metformin DAily with supper   Past Surgical History:  Procedure Laterality Date   CARDIAC CATHETERIZATION  2000   Nonobstructive disease.  Persistently reduced EF   CARPAL TUNNEL RELEASE Left    CARPAL TUNNEL RELEASE Right 01/05/2019   Procedure: RIGHT CARPAL TUNNEL RELEASE;  Surgeon: Betha Loa, MD;  Location: [facility];  Service: Orthopedics;  Laterality: Right;  Bier block   COLONOSCOPY     CORONARY CT  ANGIOGRAM  07/2017   Coronary calcium score 0?.  Focal noncalcific plaque (30%) in proximal LAD.  Otherwise no significant CAD.  Normal aortic root (3.2 cm)   HAMMER TOE SURGERY Bilateral    ingrown toenail surgery Bilateral    2017   IR RADIOLOGY PERIPHERAL GUIDED IV START  07/25/2017   IR US GUIDE VASC ACCESS RIGHT  07/25/2017   MENISCUS REPAIR Right    2016   ORIF WRIST FRACTURE Bilateral    PLANTAR FASCIA RELEASE Right    POLYPECTOMY     SHOULDER ARTHROSCOPY Left    TENDON REPAIR Right    right quad tendon repair   Tilt Table Test     Noted to have vasovagal syncope   TOTAL HIP ARTHROPLASTY Left 12/08/2020   Procedure: LEFT TOTAL HIP ARTHROPLASTY ANTERIOR APPROACH;  Surgeon: Kathryne Hitch, MD;  Location: [facility];  Service: Orthopedics;  Laterality: Left;  Needs RNFA   TOTAL HIP ARTHROPLASTY Right 03/30/2021   Procedure: RIGHT TOTAL HIP ARTHROPLASTY ANTERIOR APPROACH;  Surgeon: Kathryne Hitch, MD;  Location: [facility];  Service: Orthopedics;  Laterality: Right;   TOTAL KNEE ARTHROPLASTY Right 08/22/2023   Procedure: RIGHT  TOTAL KNEE ARTHROPLASTY;  Surgeon: Kathryne Hitch, MD;  Location: [facility];  Service: Orthopedics;  Laterality: Right;   TRANSTHORACIC ECHOCARDIOGRAM  06/21/2015   Normal LV function.  EF 57%.  Mild left atrial enlargement, trace AI, trace MR, mild PI.   TRANSTHORACIC ECHOCARDIOGRAM  06/2017   EF 60-65%.  Mild diastolic dysfunction.  No significant valvular abnormality.  Mild aortic calcification.   WISDOM TOOTH EXTRACTION Bilateral    Patient Active Problem List   Diagnosis Date Noted   Status post total right knee replacement 08/22/2023   OSA on CPAP 08/10/2023   Status post total replacement of right hip 03/30/2021   Hyperlipidemia due to dietary fat intake 10/01/2019   Morbid obesity (HCC) 10/01/2019   Excessive daytime sleepiness 03/03/2018   Obstructive sleep apnea treated with bilevel positive airway pressure (BiPAP) 03/03/2018    Sleep paralysis, recurrent isolated 03/03/2018   Cataplexy 03/03/2018   Abnormal dreams 03/03/2018   Breathholding 03/03/2018   Hypersomnia, persistent 09/03/2017   Exertional dyspnea 06/20/2017   Vasovagal syncope 06/20/2017   Essential hypertension 06/20/2017   History of cardiomyopathy 06/20/2017    PCP: Alexander Matin MD   REFERRING PROVIDER: Kathryne Hitch, MD  REFERRING DIAG: Diagnosis M17.11 (ICD-10-CM) - Unilateral primary osteoarthritis, right knee  THERAPY DIAG:  Acute pain of right knee  Muscle weakness (generalized)  Difficulty in walking, not elsewhere classified  Stiffness of right knee, not elsewhere classified  Localized edema  Abnormal posture  Rationale for Evaluation and Treatment: Rehabilitation  ONSET DATE: TKR surgery 08/22/23  SUBJECTIVE:   SUBJECTIVE STATEMENT: Arrives ambulating with RW.  Has been walking inside of the house consistently without issue.    PERTINENT HISTORY: See above   PAIN:  Are you having pain? Yes: NPRS scale: 0/10- denies pain due to meds before therapy. Possible 3-4/10 without meds  Pain location: R knee and ITB  Pain description: tightness Aggravating factors: not walking, sitting for too long   Relieving factors: movement/walking   PRECAUTIONS: None  RED FLAGS: None   WEIGHT BEARING RESTRICTIONS: No  FALLS:  Has patient fallen in last 6 months? 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    PATIENT SURVEYS:  FOTO 38, predicted 55 in 12 visits   COGNITION: Overall cognitive status: Within functional limits for tasks assessed     SENSATION: Not tested  EDEMA:  Localized edema appropriate for post-op state  LOWER EXTREMITY ROM:  Active ROM Right eval Right 09/10/23  Hip flexion    Hip extension    Hip abduction    Hip adduction    Hip internal rotation    Hip external rotation    Knee flexion Seated edge of mat table 62*  Supine AROM 60*, AAROM 63*  Standing //bar: 87*   Knee extension Seated LAQ 12*   Supine heel prop/quad set  8*   Ankle dorsiflexion    Ankle plantarflexion    Ankle inversion    Ankle eversion     (Blank rows = not tested)  LOWER EXTREMITY MMT:  MMT Right eval Left eval  Hip flexion    Hip extension    Hip abduction    Hip adduction    Hip internal rotation    Hip external rotation    Knee flexion 4 5  Knee extension 4+ 5  Ankle dorsiflexion    Ankle plantarflexion    Ankle inversion    Ankle eversion     (Blank rows = not tested)  FUNCTIONAL TESTS:  Eval:   5xSTS 24 seconds UEs on chair   GAIT: Eval:  Distance walked: in clinic distances  Assistive device utilized: Environmental consultant - 2 wheeled Level of assistance: Modified independence Comments: antalgic, favors R LE, limited R knee ROM with gait cycle                                                                                                                                 TREATMENT DATE:  09/10/2023 Therex: -Nustep x8 minutes L3 all four extremities for ROM holds into flexion with progressive seat adjustment for increased knee flexion ended on seat at 13 -Gastroc stretch off step for increased extension 3x30sec with 10 heel raises between sets -LAQ with active knee flexion x10 -Knee flexion on step 10x10sec  holds  Gait Training -pt amb 100' with cane stand alone tip with supervision. SPT demo & verbal cues on initial contact with heel, equal step length, step through pattern and upright trunk.  Pt appears safe for gait at household level with cane.   Manual: - flexion over pressure to tolerance -extension over pressure to tolerance -percussion gun to lateral R ITB and R quad -scar mobilization with approximation and same directional motions  Vaso end of session: , 34deg, medium pressure with LE elevated   09/08/23 TherEx  Knee flexion AAROM 15x5 seconds Nustep x10 minutes L3 all four extremities for ROM holds into flexion SAQs 2# 15x3 second holds RLE  SLRs 0# limited range x15 R LE   Manual  Percussion gun lateral R thigh and R quad    09/05/23  Exam findings, POC, HEP  Nustep L3 x10 minutes for ROM Education on general expectations for recovery s/p TKR, ice routine, additional exercises at home /HEP additions    PATIENT EDUCATION:  Education details: exam findings, POC, HEP  Person educated: Patient Education method: Programmer, multimedia, Demonstration, and Handouts Education comprehension: verbalized understanding, returned demonstration, and needs further education  HOME EXERCISE PROGRAM:  Access Code: [email or web address] URL: [email or web address] Date: 09/08/2023 Prepared by: Nedra Hai  Exercises - Seated Knee Flexion Extension AAROM with Overpressure  - 3 x daily - 7 x weekly - 1 sets - 10-15 reps - 5 seconds  hold - Seated Passive Knee Extension  - 3 x daily - 7 x weekly -  1 sets - 1 reps - 3-5 minutes  hold - Seated Small Alternating Straight Leg Lifts  - 3 x daily - 7 x weekly - 1 sets - 10-15 reps - Short Arc Quad with Ankle Weight  - 3 x daily - 7 x weekly - 1 sets - 10 reps - 3 seconds  hold  ASSESSMENT:  CLINICAL IMPRESSION: Patient did well with activity and did not have increase in pain. Still presents with stiffness of the knee requiring heavy  manual to encourage increased knee flexion and extension. Patient demonstrated increased ROM when doing contract relax for knee flexion and was able to progress from RW to cane with out issue. Continues to make progress and will benefit from continued skilled physical therapy to address deficits in order for patient to return to PLOF and community activities.    OBJECTIVE IMPAIRMENTS: Abnormal gait, decreased activity tolerance, decreased balance, decreased knowledge of use of DME, decreased mobility, difficulty walking, decreased ROM, decreased strength, hypomobility, increased edema, increased fascial restrictions, and pain.   ACTIVITY LIMITATIONS: sitting, standing, squatting, sleeping, stairs, transfers, and locomotion level  PARTICIPATION LIMITATIONS: driving, shopping, community activity, occupation, and yard work  PERSONAL FACTORS: Age, Education, Financial risk analyst, Past/current experiences, Profession, Social background, and Time since onset of injury/illness/exacerbation are also affecting patient's functional outcome.   REHAB POTENTIAL: Good  CLINICAL DECISION MAKING: Stable/uncomplicated  EVALUATION COMPLEXITY: Low   GOALS: Goals reviewed with patient? Yes  SHORT TERM GOALS: Target date: 10/03/2023   Will be complaint with appropriate progressive HEP  Goal status: On going 09/10/23  2.  Surgical knee extension AROM to be less than 5* Goal status: On going 09/10/23  3.  Surgical knee flexion AROM to be at least 90* Goal status: On going 09/10/23  4.  Will be independent with edema management techniques  Goal status: On going 09/10/23    LONG TERM GOALS: Target date: 10/31/2023    Surgical knee flexion AROM to be at least 105* Goal status: On going 09/10/23  2.  MMT to have improved by one grade in all weak groups  Goal status: On going 09/10/23  3.  Will be able to ambulate community distances without device and no increased pain to improve QOL  Baseline:  Goal status: On going  09/10/23  4.  Will be able to ascend/descend flight of steps with rail with reciprocal pattern/no increased pain  Goal status: On going 09/10/23  5.  Pain in surgical knee to be no more than 5/10 at worst  Goal status: On going 09/10/23  6.  FOTO score to be within 5 points of predicted value by time of DC  Baseline:  Goal status: On going 09/10/23   PLAN:  PT FREQUENCY: 2x/week  PT DURATION: 8 weeks  PLANNED INTERVENTIONS: 97164- PT Re-evaluation, 97110-Therapeutic exercises, 97530- Therapeutic activity, 97112- Neuromuscular re-education, 97535- Self Care, 16109- Manual therapy, [phone]- Gait training, [phone]- Orthotic Fit/training, [phone]- Aquatic Therapy, [phone]- Electrical stimulation (unattended), 97035- Ultrasound, 13086- Ionotophoresis 4mg /ml Dexamethasone, Taping, Dry Needling, Cryotherapy, and Moist heat  PLAN FOR NEXT SESSION: Continue ROM and manual to promote knee flex/ext- contract relax worked well, quad strengthening    Breniya Goertzen, Klinton Candelas, Student-PT 09/10/2023, 1:53 PM  This entire session of physical therapy was performed under the direct supervision of PT signing evaluation /treatment. PT reviewed note and agrees.   Vladimir Faster, PT, DPT 09/10/2023, 2:17 PM

## 2023-09-16 ENCOUNTER — Ambulatory Visit (INDEPENDENT_AMBULATORY_CARE_PROVIDER_SITE_OTHER): Payer: Medicare Other | Admitting: Physical Therapy

## 2023-09-16 ENCOUNTER — Encounter: Payer: Self-pay | Admitting: Physical Therapy

## 2023-09-16 DIAGNOSIS — M6281 Muscle weakness (generalized): Secondary | ICD-10-CM

## 2023-09-16 DIAGNOSIS — R600 Localized edema: Secondary | ICD-10-CM

## 2023-09-16 DIAGNOSIS — M25561 Pain in right knee: Secondary | ICD-10-CM | POA: Diagnosis not present

## 2023-09-16 DIAGNOSIS — R262 Difficulty in walking, not elsewhere classified: Secondary | ICD-10-CM | POA: Diagnosis not present

## 2023-09-16 DIAGNOSIS — M25661 Stiffness of right knee, not elsewhere classified: Secondary | ICD-10-CM

## 2023-09-16 NOTE — Therapy (Signed)
OUTPATIENT PHYSICAL THERAPY LOWER EXTREMITY TREATMENT   Patient Name: Alexander [name] MRN: [id number] DOB:[birth year], 73 y.o., male Today's Date: 09/16/2023  END OF SESSION:  PT End of Session - 09/16/23 1010     Visit Number 4    Number of Visits 17    Date for PT Re-Evaluation 10/31/23    Authorization Type MCR/[employer]    Authorization Time Period 09/05/23 to 10/31/23    Progress Note Due on Visit 10    PT Start Time 0934    PT Stop Time 1020    PT Time Calculation (min) 46 min    Activity Tolerance Patient tolerated treatment well    Behavior During Therapy [facility] for tasks assessed/performed              Past Medical History:  Diagnosis Date   Allergy    Cancer (HCC)    skin cancer to upper chest/basal cell 2007   Cardiomyopathy [facility])    Chronic neck pain    DJD   Chronic pain in right foot    Complication of anesthesia    slow to wake up after quadraceps surgery at [facility].   DDD (degenerative disc disease), lumbar    Depression    Dyslipidemia    Erectile dysfunction    Exogenous obesity    GERD (gastroesophageal reflux disease)    H/O acquired cardiomyopathy 1996   RESOLVED (apparently was thought to have had an MI, but did not have any intervention).  He developed cardiomyopathy that forced him to retire from [employer]..  The current EF 60-65%.   Heart murmur    History of fracture due to fall 1988   Bilateral wrist fractures   Hyperlipidemia    Hypertension    Hypogonadism male    IBS (irritable bowel syndrome)    Obstructive sleep apnea    cpap since 1999   Pre-diabetes    on Metformin DAily with supper   Past Surgical History:  Procedure Laterality Date   CARDIAC CATHETERIZATION  2000   Nonobstructive disease.  Persistently reduced EF   CARPAL TUNNEL RELEASE Left    CARPAL TUNNEL RELEASE Right 01/05/2019   Procedure: RIGHT CARPAL TUNNEL RELEASE;  Surgeon: Betha Loa, MD;  Location: [facility];  Service: Orthopedics;   Laterality: Right;  Bier block   COLONOSCOPY     CORONARY CT ANGIOGRAM  07/2017   Coronary calcium score 0?.  Focal noncalcific plaque (30%) in proximal LAD.  Otherwise no significant CAD.  Normal aortic root (3.2 cm)   HAMMER TOE SURGERY Bilateral    ingrown toenail surgery Bilateral    2017   IR RADIOLOGY PERIPHERAL GUIDED IV START  07/25/2017   IR US GUIDE VASC ACCESS RIGHT  07/25/2017   MENISCUS REPAIR Right    2016   ORIF WRIST FRACTURE Bilateral    PLANTAR FASCIA RELEASE Right    POLYPECTOMY     SHOULDER ARTHROSCOPY Left    TENDON REPAIR Right    right quad tendon repair   Tilt Table Test     Noted to have vasovagal syncope   TOTAL HIP ARTHROPLASTY Left 12/08/2020   Procedure: LEFT TOTAL HIP ARTHROPLASTY ANTERIOR APPROACH;  Surgeon: Kathryne Hitch, MD;  Location: [facility];  Service: Orthopedics;  Laterality: Left;  Needs RNFA   TOTAL HIP ARTHROPLASTY Right 03/30/2021   Procedure: RIGHT TOTAL HIP ARTHROPLASTY ANTERIOR APPROACH;  Surgeon: Kathryne Hitch, MD;  Location: [facility];  Service: Orthopedics;  Laterality: Right;  TOTAL KNEE ARTHROPLASTY Right 08/22/2023   Procedure: RIGHT TOTAL KNEE ARTHROPLASTY;  Surgeon: Kathryne Hitch, MD;  Location: [facility];  Service: Orthopedics;  Laterality: Right;   TRANSTHORACIC ECHOCARDIOGRAM  06/21/2015   Normal LV function.  EF 57%.  Mild left atrial enlargement, trace AI, trace MR, mild PI.   TRANSTHORACIC ECHOCARDIOGRAM  06/2017   EF 60-65%.  Mild diastolic dysfunction.  No significant valvular abnormality.  Mild aortic calcification.   WISDOM TOOTH EXTRACTION Bilateral    Patient Active Problem List   Diagnosis Date Noted   Status post total right knee replacement 08/22/2023   OSA on CPAP 08/10/2023   Status post total replacement of right hip 03/30/2021   Hyperlipidemia due to dietary fat intake 10/01/2019   Morbid obesity (HCC) 10/01/2019   Excessive daytime sleepiness 03/03/2018   Obstructive sleep apnea  treated with bilevel positive airway pressure (BiPAP) 03/03/2018   Sleep paralysis, recurrent isolated 03/03/2018   Cataplexy 03/03/2018   Abnormal dreams 03/03/2018   Breathholding 03/03/2018   Hypersomnia, persistent 09/03/2017   Exertional dyspnea 06/20/2017   Vasovagal syncope 06/20/2017   Essential hypertension 06/20/2017   History of cardiomyopathy 06/20/2017    PCP: Jarome Matin MD   REFERRING PROVIDER: Kathryne Hitch, MD  REFERRING DIAG: Diagnosis M17.11 (ICD-10-CM) - Unilateral primary osteoarthritis, right knee  THERAPY DIAG:  Acute pain of right knee  Muscle weakness (generalized)  Difficulty in walking, not elsewhere classified  Stiffness of right knee, not elsewhere classified  Localized edema  Rationale for Evaluation and Treatment: Rehabilitation  ONSET DATE: TKR surgery 08/22/23  SUBJECTIVE:   SUBJECTIVE STATEMENT: Pt arriving today using straight cane. Pt reporting pain of 5/10, pt stating his pain has been as low as 3/10.    PERTINENT HISTORY: See above   PAIN:  Are you having pain?  5/10 Pain location: R knee and ITB  Pain description: tightness Aggravating factors: not walking, sitting for too long   Relieving factors: movement/walking   PRECAUTIONS: None  RED FLAGS: None   WEIGHT BEARING RESTRICTIONS: No  FALLS:  Has patient fallen in last 6 months? 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    PATIENT SURVEYS:  FOTO 38, predicted 55 in 12 visits   COGNITION: Overall cognitive status: Within functional limits for tasks assessed     SENSATION: Not tested  EDEMA:  Localized edema appropriate for post-op state  LOWER EXTREMITY ROM:  Active ROM Right eval Right 09/10/23 Rt 09/16/23  Hip flexion     Hip extension     Hip abduction     Hip adduction     Hip internal rotation     Hip external rotation     Knee flexion Seated edge of mat table 62*  Supine AROM 60*, AAROM 63*  Standing //bar: 87*  Supine AA: 102  Knee extension Seated LAQ  12*   Supine heel prop/quad set   8*  P: -8  Ankle dorsiflexion     Ankle plantarflexion     Ankle inversion     Ankle eversion      (Blank rows = not tested)  LOWER EXTREMITY MMT:  MMT Right eval Left eval  Hip flexion    Hip extension    Hip abduction    Hip adduction    Hip internal rotation    Hip external rotation    Knee flexion 4 5  Knee extension 4+ 5  Ankle dorsiflexion    Ankle plantarflexion    Ankle inversion    Ankle eversion     (Blank rows = not tested)  FUNCTIONAL TESTS:  Eval:   5xSTS 24 seconds UEs on chair   GAIT: Eval:  Distance walked: in clinic distances  Assistive device utilized: Environmental consultant - 2 wheeled Level of assistance: Modified independence Comments: antalgic, favors R LE, limited R knee ROM with gait cycle                                                                                                                                 TREATMENT DATE:  09/16/23:  TherEx:  Recumbent bike: full revolutions seat 11 x 10 minutes, slow rotations LAQ: 2 x 10 c 3 #  Leg Press: 75# bil 2 x 10, Rt LE 43# 2 x 10  Calf stretch on slant board: x 2 holding 30 sec Seated SLR: 2  x 10 Supine heel slides using strap and ball x 10 holding end range x 5  sec Manual:  PROM knee flexion and extension with over pressure Percussion performed during Passive knee flexion in seated position Modalities:  Vasopneumatic 34 deg x 10 minutes, medium compression    09/10/2023 Therex: -Nustep x8 minutes L3 all four extremities for ROM holds into flexion with progressive seat adjustment for increased knee flexion ended on seat at 13 -Gastroc stretch off step for increased extension 3x30sec with 10 heel raises between sets -LAQ with active knee flexion x10 -Knee flexion on step 10x10sec holds  Gait Training -pt amb 100' with cane stand alone tip with supervision. SPT demo & verbal cues on initial contact with heel, equal step length, step through pattern and upright trunk.  Pt appears safe for gait at household level with cane.   Manual: - flexion over pressure to tolerance -extension over pressure to tolerance -percussion gun to lateral R ITB and R quad -scar mobilization with approximation and same directional motions  Vaso end of session: , 34deg, medium pressure with LE elevated   09/08/23 TherEx  Knee flexion AAROM 15x5 seconds Nustep x10 minutes L3 all four extremities for ROM holds into flexion SAQs 2# 15x3 second holds RLE  SLRs 0# limited range x15 R LE   Manual  Percussion gun lateral R thigh and R quad    09/05/23  Exam findings, POC, HEP  Nustep L3 x10 minutes for ROM Education on general expectations for recovery s/p TKR, ice routine, additional exercises at home /HEP additions    PATIENT EDUCATION:  Education details: exam findings, POC, HEP  Person educated: Patient Education method: Programmer, multimedia, Demonstration, and Handouts Education comprehension: verbalized understanding, returned demonstration, and needs further education  HOME EXERCISE PROGRAM:  Access Code: [email or web address] URL: [email or web address] Date: 09/08/2023 Prepared by: Nedra Hai  Exercises - Seated Knee Flexion Extension  AAROM with Overpressure  - 3 x daily - 7 x weekly - 1 sets - 10-15 reps - 5 seconds  hold - Seated Passive Knee Extension  - 3 x daily - 7 x weekly - 1 sets - 1 reps - 3-5 minutes  hold - Seated Small Alternating Straight Leg Lifts  - 3 x daily - 7 x weekly - 1 sets - 10-15 reps - Short Arc Quad with Ankle Weight  - 3 x daily - 7 x weekly - 1 sets - 10 reps - 3 seconds  hold  ASSESSMENT:  CLINICAL IMPRESSION: Pt amb into clinic today with straight cane. Pt tolerating all exercises well. Pt's active assisted knee flexion was 102 degrees. Continue skilled PT interventions.   OBJECTIVE IMPAIRMENTS: Abnormal gait, decreased activity tolerance, decreased balance, decreased knowledge of use of DME, decreased mobility, difficulty walking, decreased ROM, decreased strength, hypomobility, increased edema, increased fascial restrictions, and pain.   ACTIVITY LIMITATIONS: sitting, standing, squatting, sleeping, stairs, transfers, and locomotion level  PARTICIPATION LIMITATIONS: driving, shopping, community activity, occupation, and yard work  PERSONAL FACTORS: Age, Education, Financial risk analyst, Past/current experiences, Profession, Social background, and Time since onset of injury/illness/exacerbation are also affecting patient's functional outcome.   REHAB POTENTIAL: Good  CLINICAL DECISION MAKING: Stable/uncomplicated  EVALUATION COMPLEXITY: Low   GOALS: Goals reviewed with patient? Yes  SHORT TERM GOALS: Target date: 10/03/2023   Will be complaint with appropriate progressive HEP  Goal status: MET 09/16/23  2.  Surgical knee extension AROM to be less than 5* Goal status: On going 09/16/23  3.  Surgical knee flexion AROM to be at least 90* Goal status: Met 09/17/23  4.  Will be independent with edema management techniques  Goal status: On going 09/16/23    LONG TERM GOALS: Target date: 10/31/2023    Surgical knee flexion AROM to be at least 105* Goal status: On going 09/10/23  2.  MMT to have  improved by one grade in all weak groups  Goal status: On going 09/10/23  3.  Will be able to ambulate community distances without device and no increased pain to improve QOL  Baseline:  Goal status: On going 09/10/23  4.  Will be able to ascend/descend flight of steps with rail with reciprocal pattern/no increased pain  Goal status: On going 09/10/23  5.  Pain in surgical knee to be no more than 5/10 at worst  Goal status: On going 09/10/23  6.  FOTO score to be within 5 points of predicted value by time of DC  Baseline:  Goal status: On going 09/10/23   PLAN:  PT FREQUENCY: 2x/week  PT DURATION: 8 weeks  PLANNED INTERVENTIONS: 97164- PT Re-evaluation, 97110-Therapeutic exercises, 97530- Therapeutic activity, 97112- Neuromuscular re-education, 97535- Self Care, 16109- Manual therapy, [id number]- Gait training, [phone]- Orthotic Fit/training, [id number]- Aquatic Therapy, [id number]- Electrical stimulation (unattended), [id number]- Ultrasound, 09811- Ionotophoresis 4mg /ml Dexamethasone, Taping, Dry Needling, Cryotherapy, and Moist heat  PLAN FOR NEXT SESSION: Continue ROM and manual to promote knee flex/ext- contract relax worked well, Dance movement psychotherapist  Sharmon Leyden, PT, MPT 09/16/2023, 10:17 AM

## 2023-09-18 ENCOUNTER — Encounter: Payer: Self-pay | Admitting: Physical Therapy

## 2023-09-18 ENCOUNTER — Ambulatory Visit: Payer: Medicare Other | Admitting: Physical Therapy

## 2023-09-18 DIAGNOSIS — M25661 Stiffness of right knee, not elsewhere classified: Secondary | ICD-10-CM

## 2023-09-18 DIAGNOSIS — M6281 Muscle weakness (generalized): Secondary | ICD-10-CM

## 2023-09-18 DIAGNOSIS — R262 Difficulty in walking, not elsewhere classified: Secondary | ICD-10-CM | POA: Diagnosis not present

## 2023-09-18 DIAGNOSIS — M25561 Pain in right knee: Secondary | ICD-10-CM

## 2023-09-18 DIAGNOSIS — R600 Localized edema: Secondary | ICD-10-CM

## 2023-09-18 NOTE — Therapy (Signed)
OUTPATIENT PHYSICAL THERAPY LOWER EXTREMITY TREATMENT   Patient Name: Alexander [name] MRN: [id number] DOB:03-14-1951, 73 y.o., male Today's Date: 09/18/2023  END OF SESSION:  PT End of Session - 09/18/23 0949     Visit Number 5    Number of Visits 17    Date for PT Re-Evaluation 10/31/23    Authorization Type MCR/[employer]    Authorization Time Period 09/05/23 to 10/31/23    Progress Note Due on Visit 10    PT Start Time 0927    PT Stop Time 1020    PT Time Calculation (min) 53 min    Activity Tolerance Patient tolerated treatment well    Behavior During Therapy [facility] for tasks assessed/performed              Past Medical History:  Diagnosis Date   Allergy    Cancer (HCC)    skin cancer to upper chest/basal cell 2007   Cardiomyopathy [facility])    Chronic neck pain    DJD   Chronic pain in right foot    Complication of anesthesia    slow to wake up after quadraceps surgery at [facility].   DDD (degenerative disc disease), lumbar    Depression    Dyslipidemia    Erectile dysfunction    Exogenous obesity    GERD (gastroesophageal reflux disease)    H/O acquired cardiomyopathy 1996   RESOLVED (apparently was thought to have had an MI, but did not have any intervention).  He developed cardiomyopathy that forced him to retire from [employer]..  The current EF 60-65%.   Heart murmur    History of fracture due to fall 1988   Bilateral wrist fractures   Hyperlipidemia    Hypertension    Hypogonadism male    IBS (irritable bowel syndrome)    Obstructive sleep apnea    cpap since 1999   Pre-diabetes    on Metformin DAily with supper   Past Surgical History:  Procedure Laterality Date   CARDIAC CATHETERIZATION  2000   Nonobstructive disease.  Persistently reduced EF   CARPAL TUNNEL RELEASE Left    CARPAL TUNNEL RELEASE Right 01/05/2019   Procedure: RIGHT CARPAL TUNNEL RELEASE;  Surgeon: Betha Loa, MD;  Location: [facility];  Service: Orthopedics;   Laterality: Right;  Bier block   COLONOSCOPY     CORONARY CT ANGIOGRAM  07/2017   Coronary calcium score 0?.  Focal noncalcific plaque (30%) in proximal LAD.  Otherwise no significant CAD.  Normal aortic root (3.2 cm)   HAMMER TOE SURGERY Bilateral    ingrown toenail surgery Bilateral    2017   IR RADIOLOGY PERIPHERAL GUIDED IV START  07/25/2017   IR US GUIDE VASC ACCESS RIGHT  07/25/2017   MENISCUS REPAIR Right    2016   ORIF WRIST FRACTURE Bilateral    PLANTAR FASCIA RELEASE Right    POLYPECTOMY     SHOULDER ARTHROSCOPY Left    TENDON REPAIR Right    right quad tendon repair   Tilt Table Test     Noted to have vasovagal syncope   TOTAL HIP ARTHROPLASTY Left 12/08/2020   Procedure: LEFT TOTAL HIP ARTHROPLASTY ANTERIOR APPROACH;  Surgeon: Kathryne Hitch, MD;  Location: [facility];  Service: Orthopedics;  Laterality: Left;  Needs RNFA   TOTAL HIP ARTHROPLASTY Right 03/30/2021   Procedure: RIGHT TOTAL HIP ARTHROPLASTY ANTERIOR APPROACH;  Surgeon: Kathryne Hitch, MD;  Location: [facility];  Service: Orthopedics;  Laterality: Right;  TOTAL KNEE ARTHROPLASTY Right 08/22/2023   Procedure: RIGHT TOTAL KNEE ARTHROPLASTY;  Surgeon: Kathryne Hitch, MD;  Location: [facility];  Service: Orthopedics;  Laterality: Right;   TRANSTHORACIC ECHOCARDIOGRAM  06/21/2015   Normal LV function.  EF 57%.  Mild left atrial enlargement, trace AI, trace MR, mild PI.   TRANSTHORACIC ECHOCARDIOGRAM  06/2017   EF 60-65%.  Mild diastolic dysfunction.  No significant valvular abnormality.  Mild aortic calcification.   WISDOM TOOTH EXTRACTION Bilateral    Patient Active Problem List   Diagnosis Date Noted   Status post total right knee replacement 08/22/2023   OSA on CPAP 08/10/2023   Status post total replacement of right hip 03/30/2021   Hyperlipidemia due to dietary fat intake 10/01/2019   Morbid obesity (HCC) 10/01/2019   Excessive daytime sleepiness 03/03/2018   Obstructive sleep apnea  treated with bilevel positive airway pressure (BiPAP) 03/03/2018   Sleep paralysis, recurrent isolated 03/03/2018   Cataplexy 03/03/2018   Abnormal dreams 03/03/2018   Breathholding 03/03/2018   Hypersomnia, persistent 09/03/2017   Exertional dyspnea 06/20/2017   Vasovagal syncope 06/20/2017   Essential hypertension 06/20/2017   History of cardiomyopathy 06/20/2017    PCP: Jarome Matin MD   REFERRING PROVIDER: Kathryne Hitch, MD  REFERRING DIAG: Diagnosis M17.11 (ICD-10-CM) - Unilateral primary osteoarthritis, right knee  THERAPY DIAG:  Acute pain of right knee  Muscle weakness (generalized)  Difficulty in walking, not elsewhere classified  Stiffness of right knee, not elsewhere classified  Localized edema  Rationale for Evaluation and Treatment: Rehabilitation  ONSET DATE: TKR surgery 08/22/23  SUBJECTIVE:   SUBJECTIVE STATEMENT: Pt arriving today using straight cane. Pt reporting pain of 5/10, pt stating his pain has been as low as 3/10.    PERTINENT HISTORY: See above   PAIN:  Are you having pain?  5/10 Pain location: R knee and ITB  Pain description: tightness Aggravating factors: not walking, sitting for too long   Relieving factors: movement/walking   PRECAUTIONS: None  RED FLAGS: None   WEIGHT BEARING RESTRICTIONS: No  FALLS:  Has patient fallen in last 6 months? 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    PATIENT SURVEYS:  FOTO 38, predicted 55 in 12 visits   COGNITION: Overall cognitive status: Within functional limits for tasks assessed     SENSATION: Not tested  EDEMA:  Localized edema appropriate for post-op state  LOWER EXTREMITY ROM:  Active ROM Right eval Right 09/10/23 Rt 09/16/23 Rt 09/18/23  Hip flexion      Hip extension      Hip abduction      Hip adduction      Hip internal rotation      Hip external rotation      Knee flexion Seated edge of mat table 62*  Supine AROM 60*, AAROM 63*  Standing //bar: 87*  Supine AA: 102 A:102 P:105  Knee extension Seated LAQ 12*   Supine heel prop/quad set   8*  P: -8   Ankle dorsiflexion      Ankle plantarflexion      Ankle inversion      Ankle eversion       (Blank rows = not tested)  LOWER EXTREMITY MMT:  MMT Right eval Left eval  Hip flexion    Hip extension    Hip abduction    Hip adduction    Hip internal rotation    Hip external rotation    Knee flexion 4 5  Knee extension 4+ 5  Ankle dorsiflexion    Ankle plantarflexion    Ankle inversion    Ankle eversion     (Blank rows = not tested)  FUNCTIONAL TESTS:  Eval:   5xSTS 24 seconds UEs on chair   GAIT: Eval:  Distance walked: in clinic distances  Assistive device utilized: Environmental consultant - 2 wheeled Level of assistance: Modified independence Comments: antalgic, favors R LE, limited R knee ROM with gait cycle                                                                                                                                 TREATMENT DATE:  09/18/23:  TherEx:  Recumbent bike: full revolutions seat 11 x 10 minutes for ROM  LAQ: 2 x 10 c 3 #  Calf stretch on slant board: x 3 holding 30 sec  Theractivity Leg Press: 62# bil 3 x 10, Rt LE 37# 2 x 10  (dropped down in weight some from last visit  due to soreness and some pain in his knee) Stairs in clinic hall down/up one flight of stairs with one handrail and cane on other side, verbal cues and demo for step to pattern up with the strong leg and down with the weak leg first. Able to return demo with loose supervision Manual:  PROM knee flexion and extension with over pressure with Percussion performed during Passive knee flexion in seated position Scar massage  Modalities:  Vasopneumatic 34 deg x 10 minutes, medium compression Rt knee  09/16/23:  TherEx:  Recumbent bike: full revolutions seat 11 x 10 minutes, slow rotations LAQ: 2 x 10 c 3 #  Leg Press: 75# bil 2 x 10, Rt LE 43# 2 x 10  Calf stretch on slant board: x 2 holding 30 sec Seated SLR: 2  x 10 Supine heel slides using strap and ball x 10 holding end range x 5 sec Manual:  PROM knee flexion and extension with over pressure Percussion performed during Passive knee flexion in seated position Modalities:  Vasopneumatic 34 deg x 10 minutes, medium compression      PATIENT EDUCATION:  Education details: exam findings, POC, HEP  Person educated: Patient Education method: Programmer, multimedia, Demonstration, and Handouts Education comprehension: verbalized understanding, returned demonstration, and needs further education  HOME EXERCISE PROGRAM:  Access Code: [email or web address] URL: [email or web address] Date: 09/08/2023 Prepared by: Nedra Hai  Exercises - Seated Knee Flexion Extension AAROM with Overpressure  - 3 x daily - 7 x weekly - 1 sets - 10-15 reps - 5 seconds  hold - Seated Passive Knee Extension  - 3 x daily - 7 x weekly - 1 sets - 1 reps - 3-5 minutes  hold - Seated Small Alternating Straight Leg Lifts  - 3 x daily - 7 x weekly - 1 sets - 10-15 reps - Short Arc Quad with Ankle Weight  - 3 x daily - 7 x weekly - 1 sets - 10 reps - 3 seconds  hold  ASSESSMENT:  CLINICAL IMPRESSION: Again showed improved knee flexion ROM, PROM was 105 deg today. He was  able to safely perform the stairs today so he can access the lower level of his home where he has recumbent bike he can start using. He will continue to benefit from PT for knee ROM and strength to improve function.   OBJECTIVE IMPAIRMENTS: Abnormal gait, decreased activity tolerance, decreased balance, decreased knowledge of use of DME, decreased mobility, difficulty walking, decreased ROM, decreased strength, hypomobility, increased edema, increased fascial restrictions, and pain.   ACTIVITY LIMITATIONS: sitting, standing, squatting, sleeping, stairs, transfers, and locomotion level  PARTICIPATION LIMITATIONS: driving, shopping, community activity, occupation, and yard work  PERSONAL FACTORS: Age, Education, Financial risk analyst, Past/current experiences, Profession, Social background, and Time since onset of injury/illness/exacerbation are also affecting patient's functional outcome.   REHAB POTENTIAL: Good  CLINICAL DECISION MAKING: Stable/uncomplicated  EVALUATION COMPLEXITY: Low   GOALS: Goals reviewed with patient? Yes  SHORT TERM GOALS: Target date: 10/03/2023   Will be complaint with appropriate progressive HEP  Goal status: MET 09/16/23  2.  Surgical knee extension AROM to be less than 5* Goal status: On going 09/16/23  3.  Surgical knee flexion AROM to be at least 90* Goal status: Met 09/17/23  4.  Will be independent with edema management techniques  Goal status: On going 09/16/23    LONG TERM GOALS: Target date: 10/31/2023    Surgical knee flexion AROM to be at least 105* Goal status: On going 09/10/23  2.  MMT to have improved by one grade in all weak groups  Goal status: On going 09/10/23  3.  Will be able to ambulate community distances without device and no increased pain to improve QOL  Baseline:  Goal status: On going 09/10/23  4.  Will be able to ascend/descend flight of steps with rail with reciprocal pattern/no increased pain  Goal status: On going 09/10/23  5.   Pain in surgical knee to be no more than 5/10 at worst  Goal status: On going 09/10/23  6.  FOTO score to be within 5 points of predicted value by time of DC  Baseline:  Goal status: On going 09/10/23   PLAN:  PT FREQUENCY: 2x/week  PT DURATION: 8 weeks  PLANNED INTERVENTIONS: 97164- PT Re-evaluation, 97110-Therapeutic exercises, 97530- Therapeutic activity, 97112- Neuromuscular re-education, 97535- Self Care, [id number]- Manual therapy, [id number]- Gait training, [phone]- Orthotic Fit/training, [phone]- Aquatic Therapy, 97014- Electrical stimulation (unattended), [id number]- Ultrasound, 21308- Ionotophoresis 4mg /ml Dexamethasone, Taping, Dry Needling, Cryotherapy, and Moist heat  PLAN FOR NEXT SESSION: Continue ROM and manual to promote knee flex/ext- contract relax worked well, quad strengthening    April Manson, PT, DPT 09/18/2023, 10:18 AM

## 2023-09-23 ENCOUNTER — Encounter: Payer: Self-pay | Admitting: Physical Therapy

## 2023-09-23 ENCOUNTER — Ambulatory Visit (INDEPENDENT_AMBULATORY_CARE_PROVIDER_SITE_OTHER): Payer: Medicare Other | Admitting: Physical Therapy

## 2023-09-23 DIAGNOSIS — M25661 Stiffness of right knee, not elsewhere classified: Secondary | ICD-10-CM

## 2023-09-23 DIAGNOSIS — M6281 Muscle weakness (generalized): Secondary | ICD-10-CM | POA: Diagnosis not present

## 2023-09-23 DIAGNOSIS — R600 Localized edema: Secondary | ICD-10-CM

## 2023-09-23 DIAGNOSIS — R262 Difficulty in walking, not elsewhere classified: Secondary | ICD-10-CM

## 2023-09-23 DIAGNOSIS — M25561 Pain in right knee: Secondary | ICD-10-CM | POA: Diagnosis not present

## 2023-09-23 NOTE — Therapy (Signed)
 OUTPATIENT PHYSICAL THERAPY LOWER EXTREMITY TREATMENT   Patient Name: Alexander [name] MRN: [id number] DOB:[birth year], 73 y.o., male Today's Date: 09/23/2023  END OF SESSION:  PT End of Session - 09/23/23 1017     Visit Number 6    Number of Visits 17    Date for PT Re-Evaluation 10/31/23    Authorization Type MCR/[employer]    Authorization Time Period 09/05/23 to 10/31/23    Progress Note Due on Visit 10    PT Start Time 0930    PT Stop Time 1015    PT Time Calculation (min) 45 min    Activity Tolerance Patient tolerated treatment well    Behavior During Therapy [facility] for tasks assessed/performed               Past Medical History:  Diagnosis Date   Allergy    Cancer (HCC)    skin cancer to upper chest/basal cell 2007   Cardiomyopathy [facility])    Chronic neck pain    DJD   Chronic pain in right foot    Complication of anesthesia    slow to wake up after quadraceps surgery at [facility].   DDD (degenerative disc disease), lumbar    Depression    Dyslipidemia    Erectile dysfunction    Exogenous obesity    GERD (gastroesophageal reflux disease)    H/O acquired cardiomyopathy 1996   RESOLVED (apparently was thought to have had an MI, but did not have any intervention).  He developed cardiomyopathy that forced him to retire from [employer]..  The current EF 60-65%.   Heart murmur    History of fracture due to fall 1988   Bilateral wrist fractures   Hyperlipidemia    Hypertension    Hypogonadism male    IBS (irritable bowel syndrome)    Obstructive sleep apnea    cpap since 1999   Pre-diabetes    on Metformin  DAily with supper   Past Surgical History:  Procedure Laterality Date   CARDIAC CATHETERIZATION  2000   Nonobstructive disease.  Persistently reduced EF   CARPAL TUNNEL RELEASE Left    CARPAL TUNNEL RELEASE Right 01/05/2019   Procedure: RIGHT CARPAL TUNNEL RELEASE;  Surgeon: Murrell Drivers, MD;  Location: [facility];  Service: Orthopedics;   Laterality: Right;  Bier block   COLONOSCOPY     CORONARY CT ANGIOGRAM  07/2017   Coronary calcium score 0?.  Focal noncalcific plaque (30%) in proximal LAD.  Otherwise no significant CAD.  Normal aortic root (3.2 cm)   HAMMER TOE SURGERY Bilateral    ingrown toenail surgery Bilateral    2017   IR RADIOLOGY PERIPHERAL GUIDED IV START  07/25/2017   IR US  GUIDE VASC ACCESS RIGHT  07/25/2017   MENISCUS REPAIR Right    2016   ORIF WRIST FRACTURE Bilateral    PLANTAR FASCIA RELEASE Right    POLYPECTOMY     SHOULDER ARTHROSCOPY Left    TENDON REPAIR Right    right quad tendon repair   Tilt Table Test     Noted to have vasovagal syncope   TOTAL HIP ARTHROPLASTY Left 12/08/2020   Procedure: LEFT TOTAL HIP ARTHROPLASTY ANTERIOR APPROACH;  Surgeon: Vernetta Lonni GRADE, MD;  Location: [facility];  Service: Orthopedics;  Laterality: Left;  Needs RNFA   TOTAL HIP ARTHROPLASTY Right 03/30/2021   Procedure: RIGHT TOTAL HIP ARTHROPLASTY ANTERIOR APPROACH;  Surgeon: Vernetta Lonni GRADE, MD;  Location: [facility];  Service: Orthopedics;  Laterality:  Right;   TOTAL KNEE ARTHROPLASTY Right 08/22/2023   Procedure: RIGHT TOTAL KNEE ARTHROPLASTY;  Surgeon: Vernetta Lonni GRADE, MD;  Location: [facility];  Service: Orthopedics;  Laterality: Right;   TRANSTHORACIC ECHOCARDIOGRAM  06/21/2015   Normal LV function.  EF 57%.  Mild left atrial enlargement, trace AI, trace MR, mild PI.   TRANSTHORACIC ECHOCARDIOGRAM  06/2017   EF 60-65%.  Mild diastolic dysfunction.  No significant valvular abnormality.  Mild aortic calcification.   WISDOM TOOTH EXTRACTION Bilateral    Patient Active Problem List   Diagnosis Date Noted   Status post total right knee replacement 08/22/2023   OSA on CPAP 08/10/2023   Status post total replacement of right hip 03/30/2021   Hyperlipidemia due to dietary fat intake 10/01/2019   Morbid obesity (HCC) 10/01/2019   Excessive daytime sleepiness 03/03/2018   Obstructive sleep apnea  treated with bilevel positive airway pressure (BiPAP) 03/03/2018   Sleep paralysis, recurrent isolated 03/03/2018   Cataplexy 03/03/2018   Abnormal dreams 03/03/2018   Breathholding 03/03/2018   Hypersomnia, persistent 09/03/2017   Exertional dyspnea 06/20/2017   Vasovagal syncope 06/20/2017   Essential hypertension 06/20/2017   History of cardiomyopathy 06/20/2017    PCP: Yolande Sieving MD   REFERRING PROVIDER: Vernetta Lonni GRADE, MD  REFERRING DIAG: Diagnosis M17.11 (ICD-10-CM) - Unilateral primary osteoarthritis, right knee  THERAPY DIAG:  Acute pain of right knee  Muscle weakness (generalized)  Difficulty in walking, not elsewhere classified  Stiffness of right knee, not elsewhere classified  Localized edema  Rationale for Evaluation and Treatment: Rehabilitation  ONSET DATE: TKR surgery 08/22/23  SUBJECTIVE:   SUBJECTIVE STATEMENT: Relays his knee is doing better overall, he is able to navigate the stairs to get to his lower level where he can use his home gym for recumbent bike and leg press.    PERTINENT HISTORY: See above   PAIN:  Are you having pain?  3/10 Pain location: R knee and ITB  Pain description: tightness Aggravating factors: not walking, sitting for too long   Relieving factors: movement/walking   PRECAUTIONS: None  RED FLAGS: None   WEIGHT BEARING RESTRICTIONS: No  FALLS:  Has patient fallen in last 6 months? 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    PATIENT SURVEYS:  FOTO 38, predicted 55 in 12 visits   COGNITION: Overall cognitive status: Within functional limits for tasks assessed     SENSATION: Not tested  EDEMA:  Localized edema appropriate for post-op state  LOWER EXTREMITY ROM:  Active ROM Right eval Right 09/10/23 Rt 09/16/23 Rt 09/18/23 Rt 09/23/23  Hip flexion       Hip extension       Hip abduction       Hip adduction       Hip internal rotation       Hip external rotation  Knee flexion Seated edge of mat table 62*  Supine AROM 60*, AAROM 63*  Standing //bar: 87*  Supine AA: 102 A:102 P:105 A:105 P:110  Knee extension Seated LAQ 12*   Supine heel prop/quad set   8*  P: -8  A:6 P:5  Ankle dorsiflexion       Ankle plantarflexion       Ankle inversion       Ankle eversion        (Blank rows = not tested)  LOWER EXTREMITY MMT:  MMT Right eval Left eval  Hip flexion    Hip extension    Hip abduction    Hip adduction    Hip internal rotation    Hip external rotation    Knee flexion 4 5  Knee extension 4+ 5  Ankle dorsiflexion    Ankle plantarflexion    Ankle inversion    Ankle eversion     (Blank rows = not tested)  FUNCTIONAL TESTS:  Eval:   5xSTS 24 seconds UEs on chair   GAIT: Eval:  Distance walked: in clinic distances  Assistive device utilized: Environmental Consultant - 2 wheeled Level of assistance: Modified independence Comments: antalgic, favors R LE, limited R knee ROM with gait cycle                                                                                                                                 TREATMENT DATE:  09/23/23:  TherEx:  Recumbent bike: full revolutions seat 11 x 10 minutes for ROM  LAQ: 2 x 10 c 3 #  Calf stretch on slant board: x 3 holding 30 sec  Theractivity  (strength and ROM for stairs, squats, sit to stands) Leg Press: 75# bil 2 x 15, Rt LE 43# 2 x 10   Forward steps ups 6 inch with one UE support X 10 leading with Rt Lateral step ups 6 inch step with 2 UE support X 10 leading with Rt Step downs from 4 inch step X 10, (Rt leg on step leading with left) Leg extension machine SL 5# X 10, then DL up and Rt leg only to lower 10# X 10 Hamstring curl machine DL 74# [id number]  Manual:  PROM knee flexion and extension with over pressure supine as well as supine knee extension mobs grade 3, manual hamstring stretching  09/18/23:  TherEx:  Recumbent bike: full revolutions seat 11 x 10 minutes for ROM  LAQ: 2 x 10 c 3 #  Calf stretch on slant board: x 3 holding 30 sec  Theractivity Leg Press: 62# bil 3 x 10, Rt LE 37# 2 x 10  (dropped down in weight some from last visit due to soreness and some pain in his knee) Stairs in clinic hall down/up one flight of stairs with one handrail and cane on other side, verbal cues and demo for step to pattern up with the strong leg and down with the  weak leg first. Able to return demo with loose supervision Manual:  PROM knee flexion and extension with over pressure with Percussion performed during Passive knee flexion in seated position Scar massage  Modalities:  Vasopneumatic 34 deg x 10 minutes, medium compression Rt knee  09/16/23:  TherEx:  Recumbent bike: full revolutions seat 11 x 10 minutes, slow rotations LAQ: 2 x 10 c 3 #  Leg Press: 75# bil 2 x 10, Rt LE 43# 2 x 10  Calf stretch on slant board: x 2 holding 30 sec Seated SLR: 2  x 10 Supine heel slides using strap and ball x 10 holding end range x 5 sec Manual:  PROM knee flexion and extension with over pressure Percussion performed during Passive knee flexion in seated position Modalities:  Vasopneumatic 34 deg x 10 minutes, medium compression      PATIENT EDUCATION:  Education details: exam findings, POC, HEP  Person educated:  Patient Education method: Programmer, Multimedia, Demonstration, and Handouts Education comprehension: verbalized understanding, returned demonstration, and needs further education  HOME EXERCISE PROGRAM:  Access Code: [email or web address] URL: [email or web address] Date: 09/08/2023 Prepared by: Josette Rough  Exercises - Seated Knee Flexion Extension AAROM with Overpressure  - 3 x daily - 7 x weekly - 1 sets - 10-15 reps - 5 seconds  hold - Seated Passive Knee Extension  - 3 x daily - 7 x weekly - 1 sets - 1 reps - 3-5 minutes  hold - Seated Small Alternating Straight Leg Lifts  - 3 x daily - 7 x weekly - 1 sets - 10-15 reps - Short Arc Quad with Ankle Weight  - 3 x daily - 7 x weekly - 1 sets - 10 reps - 3 seconds  hold  ASSESSMENT:  CLINICAL IMPRESSION: He is overall progressing well with PT up to this point, showed improved ROM measurements again. PT will continue to progress as tolerated toward his functional goals.  OBJECTIVE IMPAIRMENTS: Abnormal gait, decreased activity tolerance, decreased balance, decreased knowledge of use of DME, decreased mobility, difficulty walking, decreased ROM, decreased strength, hypomobility, increased edema, increased fascial restrictions, and pain.   ACTIVITY LIMITATIONS: sitting, standing, squatting, sleeping, stairs, transfers, and locomotion level  PARTICIPATION LIMITATIONS: driving, shopping, community activity, occupation, and yard work  PERSONAL FACTORS: Age, Education, Financial Risk Analyst, Past/current experiences, Profession, Social background, and Time since onset of injury/illness/exacerbation are also affecting patient's functional outcome.   REHAB POTENTIAL: Good  CLINICAL DECISION MAKING: Stable/uncomplicated  EVALUATION COMPLEXITY: Low   GOALS: Goals reviewed with patient? Yes  SHORT TERM GOALS: Target date: 10/03/2023   Will be complaint with appropriate progressive HEP  Goal status: MET 09/16/23  2.  Surgical knee extension AROM to be less  than 5* Goal status: On going 09/16/23  3.  Surgical knee flexion AROM to be at least 90* Goal status: Met 09/17/23  4.  Will be independent with edema management techniques  Goal status: On going 09/16/23    LONG TERM GOALS: Target date: 10/31/2023    Surgical knee flexion AROM to be at least 105* Goal status: On going 09/10/23  2.  MMT to have improved by one grade in all weak groups  Goal status: On going 09/10/23  3.  Will be able to ambulate community distances without device and no increased pain to improve QOL  Baseline:  Goal status: On going 09/10/23  4.  Will be able to ascend/descend flight of steps with rail with reciprocal pattern/no increased pain  Goal status: On going 09/10/23  5.  Pain in surgical knee to be no more than 5/10 at worst  Goal status: On going 09/10/23  6.  FOTO score to be within 5 points of predicted value by time of DC  Baseline:  Goal status: On going 09/10/23   PLAN:  PT FREQUENCY: 2x/week  PT DURATION: 8 weeks  PLANNED INTERVENTIONS: 97164- PT Re-evaluation, 97110-Therapeutic exercises, 97530- Therapeutic activity, 97112- Neuromuscular re-education, 97535- Self Care, [id number]- Manual therapy, [id number]- Gait training, [phone]- Orthotic Fit/training, [phone]- Aquatic Therapy, 97014- Electrical stimulation (unattended), 97035- Ultrasound, [id number]- Ionotophoresis 4mg /ml Dexamethasone , Taping, Dry Needling, Cryotherapy, and Moist heat  PLAN FOR NEXT SESSION: Continue ROM and manual to promote knee flex/ext- contract relax worked well, quad strengthening    Redell JONELLE Moose, PT, DPT 09/23/2023, 10:18 AM

## 2023-09-26 ENCOUNTER — Ambulatory Visit (INDEPENDENT_AMBULATORY_CARE_PROVIDER_SITE_OTHER): Payer: Medicare Other | Admitting: Physical Therapy

## 2023-09-26 ENCOUNTER — Encounter: Payer: Self-pay | Admitting: Physical Therapy

## 2023-09-26 DIAGNOSIS — M25561 Pain in right knee: Secondary | ICD-10-CM

## 2023-09-26 DIAGNOSIS — M6281 Muscle weakness (generalized): Secondary | ICD-10-CM | POA: Diagnosis not present

## 2023-09-26 DIAGNOSIS — M25661 Stiffness of right knee, not elsewhere classified: Secondary | ICD-10-CM | POA: Diagnosis not present

## 2023-09-26 DIAGNOSIS — R600 Localized edema: Secondary | ICD-10-CM

## 2023-09-26 DIAGNOSIS — R262 Difficulty in walking, not elsewhere classified: Secondary | ICD-10-CM

## 2023-09-26 NOTE — Therapy (Signed)
 OUTPATIENT PHYSICAL THERAPY LOWER EXTREMITY TREATMENT   Patient Name: Alexander [name] MRN: [id number] DOB:February 22, 1951, 73 y.o., male Today's Date: 09/26/2023  END OF SESSION:  PT End of Session - 09/26/23 0930     Visit Number 7    Number of Visits 17    Date for PT Re-Evaluation 10/31/23    Authorization Type MCR/[employer]    Authorization Time Period 09/05/23 to 10/31/23    Progress Note Due on Visit 10    PT Start Time 0931    PT Stop Time 1012    PT Time Calculation (min) 41 min    Activity Tolerance Patient tolerated treatment well    Behavior During Therapy [facility] for tasks assessed/performed                Past Medical History:  Diagnosis Date   Allergy    Cancer (HCC)    skin cancer to upper chest/basal cell 2007   Cardiomyopathy [facility])    Chronic neck pain    DJD   Chronic pain in right foot    Complication of anesthesia    slow to wake up after quadraceps surgery at [facility].   DDD (degenerative disc disease), lumbar    Depression    Dyslipidemia    Erectile dysfunction    Exogenous obesity    GERD (gastroesophageal reflux disease)    H/O acquired cardiomyopathy 1996   RESOLVED (apparently was thought to have had an MI, but did not have any intervention).  He developed cardiomyopathy that forced him to retire from [employer]..  The current EF 60-65%.   Heart murmur    History of fracture due to fall 1988   Bilateral wrist fractures   Hyperlipidemia    Hypertension    Hypogonadism male    IBS (irritable bowel syndrome)    Obstructive sleep apnea    cpap since 1999   Pre-diabetes    on Metformin  DAily with supper   Past Surgical History:  Procedure Laterality Date   CARDIAC CATHETERIZATION  2000   Nonobstructive disease.  Persistently reduced EF   CARPAL TUNNEL RELEASE Left    CARPAL TUNNEL RELEASE Right 01/05/2019   Procedure: RIGHT CARPAL TUNNEL RELEASE;  Surgeon: Murrell Drivers, MD;  Location: [facility];  Service:  Orthopedics;  Laterality: Right;  Bier block   COLONOSCOPY     CORONARY CT ANGIOGRAM  07/2017   Coronary calcium score 0?.  Focal noncalcific plaque (30%) in proximal LAD.  Otherwise no significant CAD.  Normal aortic root (3.2 cm)   HAMMER TOE SURGERY Bilateral    ingrown toenail surgery Bilateral    2017   IR RADIOLOGY PERIPHERAL GUIDED IV START  07/25/2017   IR US  GUIDE VASC ACCESS RIGHT  07/25/2017   MENISCUS REPAIR Right    2016   ORIF WRIST FRACTURE Bilateral    PLANTAR FASCIA RELEASE Right    POLYPECTOMY     SHOULDER ARTHROSCOPY Left    TENDON REPAIR Right    right quad tendon repair   Tilt Table Test     Noted to have vasovagal syncope   TOTAL HIP ARTHROPLASTY Left 12/08/2020   Procedure: LEFT TOTAL HIP ARTHROPLASTY ANTERIOR APPROACH;  Surgeon: Vernetta Lonni GRADE, MD;  Location: [facility];  Service: Orthopedics;  Laterality: Left;  Needs RNFA   TOTAL HIP ARTHROPLASTY Right 03/30/2021   Procedure: RIGHT TOTAL HIP ARTHROPLASTY ANTERIOR APPROACH;  Surgeon: Vernetta Lonni GRADE, MD;  Location: [facility];  Service: Orthopedics;  Laterality: Right;   TOTAL KNEE ARTHROPLASTY Right 08/22/2023   Procedure: RIGHT TOTAL KNEE ARTHROPLASTY;  Surgeon: Vernetta Lonni GRADE, MD;  Location: [facility];  Service: Orthopedics;  Laterality: Right;   TRANSTHORACIC ECHOCARDIOGRAM  06/21/2015   Normal LV function.  EF 57%.  Mild left atrial enlargement, trace AI, trace MR, mild PI.   TRANSTHORACIC ECHOCARDIOGRAM  06/2017   EF 60-65%.  Mild diastolic dysfunction.  No significant valvular abnormality.  Mild aortic calcification.   WISDOM TOOTH EXTRACTION Bilateral    Patient Active Problem List   Diagnosis Date Noted   Status post total right knee replacement 08/22/2023   OSA on CPAP 08/10/2023   Status post total replacement of right hip 03/30/2021   Hyperlipidemia due to dietary fat intake 10/01/2019   Morbid obesity (HCC) 10/01/2019   Excessive daytime sleepiness 03/03/2018   Obstructive  sleep apnea treated with bilevel positive airway pressure (BiPAP) 03/03/2018   Sleep paralysis, recurrent isolated 03/03/2018   Cataplexy 03/03/2018   Abnormal dreams 03/03/2018   Breathholding 03/03/2018   Hypersomnia, persistent 09/03/2017   Exertional dyspnea 06/20/2017   Vasovagal syncope 06/20/2017   Essential hypertension 06/20/2017   History of cardiomyopathy 06/20/2017    PCP: Yolande Sieving MD   REFERRING PROVIDER: Vernetta Lonni GRADE, MD  REFERRING DIAG: Diagnosis M17.11 (ICD-10-CM) - Unilateral primary osteoarthritis, right knee  THERAPY DIAG:  Acute pain of right knee  Muscle weakness (generalized)  Difficulty in walking, not elsewhere classified  Stiffness of right knee, not elsewhere classified  Localized edema  Rationale for Evaluation and Treatment: Rehabilitation  ONSET DATE: TKR surgery 08/22/23  SUBJECTIVE:   SUBJECTIVE STATEMENT:  Knee is feeling a lot better, range has improved a lot since last time I saw you. Have been able to get down to my fitness equipment downstairs. Ankle has been hurting on and off.      PERTINENT HISTORY: See above   PAIN:  Are you having pain?  1-2/10 Pain location: R knee and ITB, some in calf and HS too  Pain description: sore, tight  Aggravating factors: not walking, sitting for too long   Relieving factors: movement/walking   PRECAUTIONS: None  RED FLAGS: None   WEIGHT BEARING RESTRICTIONS: No  FALLS:  Has patient fallen in last 6 months? 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    PATIENT SURVEYS:  FOTO 38, predicted 55 in 12 visits   COGNITION: Overall cognitive status: Within functional limits for tasks assessed     SENSATION: Not tested  EDEMA:  Localized edema appropriate for post-op state  LOWER EXTREMITY ROM:  Active ROM Right eval Right 09/10/23 Rt 09/16/23 Rt 09/18/23 Rt 09/23/23  Hip flexion       Hip extension       Hip abduction       Hip adduction  Hip internal rotation       Hip external rotation       Knee flexion Seated edge of mat table 62*  Supine AROM 60*, AAROM 63*  Standing //bar: 87*  Supine AA: 102 A:102 P:105 A:105 P:110  Knee extension Seated LAQ 12*   Supine heel prop/quad set   8*  P: -8  A:6 P:5  Ankle dorsiflexion       Ankle plantarflexion       Ankle inversion       Ankle eversion        (Blank rows = not tested)  LOWER EXTREMITY MMT:  MMT Right eval Left eval  Hip flexion    Hip extension    Hip abduction    Hip adduction    Hip internal rotation    Hip external rotation    Knee flexion 4 5  Knee extension 4+ 5  Ankle dorsiflexion    Ankle plantarflexion    Ankle inversion    Ankle eversion     (Blank rows = not tested)  FUNCTIONAL TESTS:  Eval:   5xSTS 24 seconds UEs on chair   GAIT: Eval:  Distance walked: in clinic distances  Assistive device utilized: Environmental Consultant - 2 wheeled Level of assistance: Modified independence Comments: antalgic, favors R LE, limited R knee ROM with gait cycle                                                                                                                                 TREATMENT DATE:    09/26/23  Bike seat 11 x10 minutes full rotations for ROM, tissue perfusion Forward step ups 6 inch box x15  R LE  Lateral step ups 6 inch box x15  R LE  Forward heel taps R LE on box x15  Shuttle press BLEs 87# x12 Shuttle press R LE only 50# x12     Knee flexion stretch 12x5 second holds  LAQs 4# x15  Gastroc stretches on door way R LE 3x30 seconds  HS stretches 3x30 seconds RLE 3x30 seconds            09/23/23:  TherEx:  Recumbent bike: full revolutions seat 11 x 10 minutes for ROM  LAQ: 2 x 10 c 3 #  Calf stretch on slant board: x 3 holding 30 sec  Theractivity (strength and ROM for stairs, squats, sit to stands) Leg Press: 75# bil 2 x 15, Rt LE 43# 2 x 10   Forward steps ups 6 inch with one UE support X 10 leading with Rt Lateral step ups 6 inch step with 2 UE support X 10 leading with Rt Step downs from 4 inch step X 10, (Rt leg on step leading with left) Leg extension machine SL 5# X 10, then DL up and Rt leg only to lower 10# X 10 Hamstring curl machine DL 74# [id number]  Manual:  PROM knee flexion and extension  with over pressure supine as well as supine knee extension mobs grade 3, manual hamstring stretching  09/18/23:  TherEx:  Recumbent bike: full revolutions seat 11 x 10 minutes for ROM  LAQ: 2 x 10 c 3 #  Calf stretch on slant board: x 3 holding 30 sec  Theractivity Leg Press: 62# bil 3 x 10, Rt LE 37# 2 x 10  (dropped down in weight some from last visit due to soreness and some pain in his knee) Stairs in clinic hall down/up one flight of stairs with one handrail and cane on other side, verbal cues and demo for step to pattern up with the strong leg and down with the weak leg first. Able to return demo with loose supervision Manual:  PROM knee flexion and extension with over pressure with Percussion performed during Passive knee flexion in seated position Scar massage  Modalities:  Vasopneumatic 34 deg x 10 minutes, medium compression Rt knee  09/16/23:  TherEx:  Recumbent bike: full revolutions seat 11 x 10 minutes, slow rotations LAQ: 2 x 10 c 3 #  Leg  Press: 75# bil 2 x 10, Rt LE 43# 2 x 10  Calf stretch on slant board: x 2 holding 30 sec Seated SLR: 2  x 10 Supine heel slides using strap and ball x 10 holding end range x 5 sec Manual:  PROM knee flexion and extension with over pressure Percussion performed during Passive knee flexion in seated position Modalities:  Vasopneumatic 34 deg x 10 minutes, medium compression      PATIENT EDUCATION:  Education details: exam findings, POC, HEP  Person educated: Patient Education method: Programmer, Multimedia, Demonstration, and Handouts Education comprehension: verbalized understanding, returned demonstration, and needs further education  HOME EXERCISE PROGRAM:  Access Code: [email or web address] URL: [email or web address] Date: 09/08/2023 Prepared by: Josette Rough  Exercises - Seated Knee Flexion Extension AAROM with Overpressure  - 3 x daily - 7 x weekly - 1 sets - 10-15 reps - 5 seconds  hold - Seated Passive Knee Extension  - 3 x daily - 7 x weekly - 1 sets - 1 reps - 3-5 minutes  hold - Seated Small Alternating Straight Leg Lifts  - 3 x daily - 7 x weekly - 1 sets - 10-15 reps - Short Arc Quad with Ankle Weight  - 3 x daily - 7 x weekly - 1 sets - 10 reps - 3 seconds  hold  ASSESSMENT:  CLINICAL IMPRESSION:   Pt arrives today doing well, he is really making good progress with PT so far. We continued working on strength and ROM as appropriate. Happy to see that he is feeling so much better, anticipate he will continue to progress well. Noted an area in the middle of his incision that was very dry and did not appear to be healing well, no drainage or odor, no signs of inflammation or infection, no pain or drainage with palpation. Applied bacitracin antibiotic cream and bandaid today, educated pt to keep this area covered with bandaid and neosporin until it closes up a bit better.    OBJECTIVE IMPAIRMENTS: Abnormal gait, decreased activity tolerance, decreased balance, decreased  knowledge of use of DME, decreased mobility, difficulty walking, decreased ROM, decreased strength, hypomobility, increased edema, increased fascial restrictions, and pain.   ACTIVITY LIMITATIONS: sitting, standing, squatting, sleeping, stairs, transfers, and locomotion level  PARTICIPATION LIMITATIONS: driving, shopping, community activity, occupation, and yard work  PERSONAL FACTORS: Age, Education, Fitness, Past/current experiences, Profession, Social background, and Time since onset  of injury/illness/exacerbation are also affecting patient's functional outcome.   REHAB POTENTIAL: Good  CLINICAL DECISION MAKING: Stable/uncomplicated  EVALUATION COMPLEXITY: Low   GOALS: Goals reviewed with patient? Yes  SHORT TERM GOALS: Target date: 10/03/2023   Will be complaint with appropriate progressive HEP  Goal status: MET 09/16/23  2.  Surgical knee extension AROM to be less than 5* Goal status: On going 09/16/23  3.  Surgical knee flexion AROM to be at least 90* Goal status: Met 09/17/23  4.  Will be independent with edema management techniques  Goal status: On going 09/16/23    LONG TERM GOALS: Target date: 10/31/2023    Surgical knee flexion AROM to be at least 105* Goal status: On going 09/10/23  2.  MMT to have improved by one grade in all weak groups  Goal status: On going 09/10/23  3.  Will be able to ambulate community distances without device and no increased pain to improve QOL  Baseline:  Goal status: On going 09/10/23  4.  Will be able to ascend/descend flight of steps with rail with reciprocal pattern/no increased pain  Goal status: On going 09/10/23  5.  Pain in surgical knee to be no more than 5/10 at worst  Goal status: On going 09/10/23  6.  FOTO score to be within 5 points of predicted value by time of DC  Baseline:  Goal status: On going 09/10/23   PLAN:  PT FREQUENCY: 2x/week  PT DURATION: 8 weeks  PLANNED INTERVENTIONS: 97164- PT Re-evaluation,  97110-Therapeutic exercises, 97530- Therapeutic activity, 97112- Neuromuscular re-education, 97535- Self Care, [id number]- Manual therapy, [id number]- Gait training, [phone]- Orthotic Fit/training, [id number]- Aquatic Therapy, 97014- Electrical stimulation (unattended), [id number]- Ultrasound, [id number]- Ionotophoresis 4mg /ml Dexamethasone , Taping, Dry Needling, Cryotherapy, and Moist heat  PLAN FOR NEXT SESSION: Continue ROM and manual to promote knee flex/ext- contract relax worked well, dance movement psychotherapist. Monitor open area in the middle of his incision, needs progress note next visit (prior to MD appt)   Josette Rough, PT, DPT 09/26/23 10:12 AM

## 2023-09-30 ENCOUNTER — Ambulatory Visit (INDEPENDENT_AMBULATORY_CARE_PROVIDER_SITE_OTHER): Payer: Medicare Other | Admitting: Physical Therapy

## 2023-09-30 ENCOUNTER — Encounter: Payer: Self-pay | Admitting: Physical Therapy

## 2023-09-30 DIAGNOSIS — R600 Localized edema: Secondary | ICD-10-CM

## 2023-09-30 DIAGNOSIS — M6281 Muscle weakness (generalized): Secondary | ICD-10-CM | POA: Diagnosis not present

## 2023-09-30 DIAGNOSIS — M25661 Stiffness of right knee, not elsewhere classified: Secondary | ICD-10-CM

## 2023-09-30 DIAGNOSIS — M25561 Pain in right knee: Secondary | ICD-10-CM | POA: Diagnosis not present

## 2023-09-30 DIAGNOSIS — R262 Difficulty in walking, not elsewhere classified: Secondary | ICD-10-CM | POA: Diagnosis not present

## 2023-09-30 NOTE — Therapy (Signed)
OUTPATIENT PHYSICAL THERAPY LOWER EXTREMITY TREATMENT Progress note Progress Note reporting period date  09/05/23 to 09/30/23  See below for objective and subjective measurements relating to patients progress with PT.    Patient Name: Alexander [name] MRN: [id number] DOB:11/04/1950, 73 y.o., male Today's Date: 09/30/2023  END OF SESSION:  PT End of Session - 09/30/23 0938     Visit Number 8    Number of Visits 17    Date for PT Re-Evaluation 10/31/23    Authorization Type MCR/[employer]    Authorization Time Period 09/05/23 to 10/31/23    Progress Note Due on Visit 18    PT Start Time 0927    PT Stop Time 1010    PT Time Calculation (min) 43 min    Activity Tolerance Patient tolerated treatment well    Behavior During Therapy [facility] for tasks assessed/performed                 Past Medical History:  Diagnosis Date   Allergy    Cancer (HCC)    skin cancer to upper chest/basal cell 2007   Cardiomyopathy [facility])    Chronic neck pain    DJD   Chronic pain in right foot    Complication of anesthesia    slow to wake up after quadraceps surgery at [facility].   DDD (degenerative disc disease), lumbar    Depression    Dyslipidemia    Erectile dysfunction    Exogenous obesity    GERD (gastroesophageal reflux disease)    H/O acquired cardiomyopathy 1996   RESOLVED (apparently was thought to have had an MI, but did not have any intervention).  He developed cardiomyopathy that forced him to retire from [employer]..  The current EF 60-65%.   Heart murmur    History of fracture due to fall 1988   Bilateral wrist fractures   Hyperlipidemia    Hypertension    Hypogonadism male    IBS (irritable bowel syndrome)    Obstructive sleep apnea    cpap since 1999   Pre-diabetes    on Metformin DAily with supper   Past Surgical History:  Procedure Laterality Date   CARDIAC CATHETERIZATION  2000   Nonobstructive disease.  Persistently reduced EF   CARPAL TUNNEL RELEASE Left     CARPAL TUNNEL RELEASE Right 01/05/2019   Procedure: RIGHT CARPAL TUNNEL RELEASE;  Surgeon: Betha Loa, MD;  Location: [facility];  Service: Orthopedics;  Laterality: Right;  Bier block   COLONOSCOPY     CORONARY CT ANGIOGRAM  07/2017   Coronary calcium score 0?.  Focal noncalcific plaque (30%) in proximal LAD.  Otherwise no significant CAD.  Normal aortic root (3.2 cm)   HAMMER TOE SURGERY Bilateral    ingrown toenail surgery Bilateral    2017   IR RADIOLOGY PERIPHERAL GUIDED IV START  07/25/2017   IR US GUIDE VASC ACCESS RIGHT  07/25/2017   MENISCUS REPAIR Right    2016   ORIF WRIST FRACTURE Bilateral    PLANTAR FASCIA RELEASE Right    POLYPECTOMY     SHOULDER ARTHROSCOPY Left    TENDON REPAIR Right    right quad tendon repair   Tilt Table Test     Noted to have vasovagal syncope   TOTAL HIP ARTHROPLASTY Left 12/08/2020   Procedure: LEFT TOTAL HIP ARTHROPLASTY ANTERIOR APPROACH;  Surgeon: Kathryne Hitch, MD;  Location: [facility];  Service: Orthopedics;  Laterality: Left;  Needs RNFA   TOTAL  HIP ARTHROPLASTY Right 03/30/2021   Procedure: RIGHT TOTAL HIP ARTHROPLASTY ANTERIOR APPROACH;  Surgeon: Kathryne Hitch, MD;  Location: [facility];  Service: Orthopedics;  Laterality: Right;   TOTAL KNEE ARTHROPLASTY Right 08/22/2023   Procedure: RIGHT TOTAL KNEE ARTHROPLASTY;  Surgeon: Kathryne Hitch, MD;  Location: [facility];  Service: Orthopedics;  Laterality: Right;   TRANSTHORACIC ECHOCARDIOGRAM  06/21/2015   Normal LV function.  EF 57%.  Mild left atrial enlargement, trace AI, trace MR, mild PI.   TRANSTHORACIC ECHOCARDIOGRAM  06/2017   EF 60-65%.  Mild diastolic dysfunction.  No significant valvular abnormality.  Mild aortic calcification.   WISDOM TOOTH EXTRACTION Bilateral    Patient Active Problem List   Diagnosis Date Noted   Status post total right knee replacement 08/22/2023   OSA on CPAP 08/10/2023   Status post total replacement of right hip  03/30/2021   Hyperlipidemia due to dietary fat intake 10/01/2019   Morbid obesity (HCC) 10/01/2019   Excessive daytime sleepiness 03/03/2018   Obstructive sleep apnea treated with bilevel positive airway pressure (BiPAP) 03/03/2018   Sleep paralysis, recurrent isolated 03/03/2018   Cataplexy 03/03/2018   Abnormal dreams 03/03/2018   Breathholding 03/03/2018   Hypersomnia, persistent 09/03/2017   Exertional dyspnea 06/20/2017   Vasovagal syncope 06/20/2017   Essential hypertension 06/20/2017   History of cardiomyopathy 06/20/2017    PCP: Jarome Matin MD   REFERRING PROVIDER: Kathryne Hitch, MD  REFERRING DIAG: Diagnosis M17.11 (ICD-10-CM) - Unilateral primary osteoarthritis, right knee  THERAPY DIAG:  Acute pain of right knee  Muscle weakness (generalized)  Difficulty in walking, not elsewhere classified  Stiffness of right knee, not elsewhere classified  Localized edema  Rationale for Evaluation and Treatment: Rehabilitation  ONSET DATE: TKR surgery 08/22/23  SUBJECTIVE:   SUBJECTIVE STATEMENT: Rt knee pain 1/10 and Rt ankle pain 4/10. His ankle has been swollen lately and unsure why, he thinks maybe the leg press machine as he also does this at home. He has not had injury to his ankle so he is puzzled as to why      PERTINENT HISTORY: See above   PAIN:  Are you having pain?  See subjective statement Pain location: R knee and ITB, some in calf and HS too  Pain description: sore, tight  Aggravating factors: not walking, sitting for too long   Relieving factors: movement/walking   PRECAUTIONS: None  RED FLAGS: None   WEIGHT BEARING RESTRICTIONS: No  FALLS:  Has patient fallen in last 6 months? 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    PATIENT SURVEYS:  FOTO 38, predicted 55 in 12 visits   COGNITION: Overall cognitive status: Within functional limits for tasks assessed     SENSATION: Not tested  EDEMA:  Localized edema appropriate for post-op state  LOWER EXTREMITY ROM:  Active ROM Right eval Right  09/10/23 Rt 09/16/23 Rt 09/18/23 Rt 09/23/23 Rt 09/30/23  Hip flexion        Hip extension        Hip abduction        Hip adduction        Hip internal rotation        Hip external rotation        Knee flexion Seated edge of mat table 62*  Supine AROM 60*, AAROM 63*  Standing //bar: 87*  Supine AA: 102 A:102 P:105 A:105 P:110 A:105 P:113  Knee extension Seated LAQ 12*   Supine heel prop/quad set   8*  P: -8  A:6 P:5 A:3  Ankle dorsiflexion        Ankle plantarflexion        Ankle inversion        Ankle eversion         (Blank rows = not tested)  LOWER EXTREMITY MMT:  MMT Right eval Left eval Right 09/30/23  Hip flexion     Hip extension     Hip abduction     Hip adduction     Hip internal rotation     Hip external rotation     Knee flexion 4 5 5   Knee extension 4+ 5 5  Ankle dorsiflexion   4  Ankle plantarflexion   5  Ankle inversion   4  Ankle eversion   4   (Blank rows = not tested)  FUNCTIONAL TESTS:  Eval:   5xSTS 24 seconds UEs on chair  09/30/23: SLS 5 sec average of 3  GAIT: Eval:  Distance walked: in clinic distances  Assistive device utilized: Environmental consultant - 2 wheeled Level of assistance: Modified independence Comments:  antalgic, favors R LE, limited R knee ROM with gait cycle                                                                                                                                 TREATMENT DATE:  09/30/23:  TherEx:  Sci fit bike X 10 for ROM and endurance  Physical performance testing Updated ROM measurements and strength assessments, Single leg stance tested with 5 seconds average on Rt LE. Held 5 times sit to stand test due to ankle pain  Theractivity (strength and ROM for stairs, squats, sit to stands)  Leg extension machine SL 10# X 10, then DL 91# 4N82  Hamstring curl machine DL 95# 6O13 Single leg stance 15 sec X 4  Manual:  PROM knee flexion and extension with over pressure supine   09/26/23  Bike seat 11 x10 minutes full rotations for ROM, tissue perfusion Forward step ups 6 inch box x15 R LE  Lateral step ups 6 inch box x15  R LE  Forward heel taps R LE on box x15  Shuttle press BLEs 87# x12 Shuttle press R LE only 50# x12     Knee flexion stretch 12x5 second holds  LAQs 4# x15  Gastroc stretches on door way R LE 3x30 seconds  HS stretches 3x30 seconds RLE 3x30 seconds    PATIENT EDUCATION:  Education details: exam findings, POC, HEP  Person educated: Patient Education method: Explanation, Demonstration, and Handouts Education comprehension: verbalized understanding, returned demonstration, and needs further education  HOME EXERCISE PROGRAM:  Access Code: [email or web address] URL: [email or web address] Date: 09/08/2023 Prepared by: Nedra Hai  Exercises - Seated Knee Flexion Extension AAROM with Overpressure  - 3 x daily - 7 x weekly - 1 sets - 10-15 reps - 5 seconds  hold - Seated Passive Knee Extension  - 3 x daily - 7 x weekly - 1 sets - 1 reps - 3-5 minutes  hold - Seated Small Alternating Straight Leg Lifts  - 3 x daily - 7 x weekly - 1 sets - 10-15 reps - Short Arc Quad with Ankle Weight  - 3 x daily - 7 x weekly - 1 sets - 10 reps - 3  seconds  hold  ASSESSMENT:  CLINICAL IMPRESSION: Progress note shows he is making good overall progress with his Rt knee post op TKA. See updated measurements above as his knee ROM and strength are looking good however he has been limited lately by Rt ankle pain and swelling and cause is unclear. I did give him single leg balance exercise and ankle pumps in elevation to try to reduce the swelling in his ankle and recommended he also discuss this at upcoming MD follow up.    OBJECTIVE IMPAIRMENTS: Abnormal gait, decreased activity tolerance, decreased balance, decreased knowledge of use of DME, decreased mobility, difficulty walking, decreased ROM, decreased strength, hypomobility, increased edema, increased fascial restrictions, and pain.   ACTIVITY LIMITATIONS: sitting, standing, squatting, sleeping, stairs, transfers, and locomotion level  PARTICIPATION LIMITATIONS: driving, shopping, community activity, occupation, and yard work  PERSONAL FACTORS: Age, Education, Financial risk analyst, Past/current experiences, Profession, Social background, and Time since onset of injury/illness/exacerbation are also affecting patient's functional outcome.   REHAB POTENTIAL: Good  CLINICAL DECISION MAKING: Stable/uncomplicated  EVALUATION COMPLEXITY: Low   GOALS: Goals reviewed with patient? Yes  SHORT TERM GOALS: Target date: 10/03/2023   Will be complaint with appropriate progressive HEP  Goal status: MET 09/16/23  2.  Surgical knee extension AROM to be less than 5* Goal status: On going 09/16/23  3.  Surgical knee flexion AROM to be at least 90* Goal status: Met 09/17/23  4.  Will be independent with edema management techniques  Goal status: On going 09/30/23    LONG TERM GOALS: Target date: 10/31/2023    Surgical knee flexion AROM to be at least 105* Goal status: MET 09/30/23  2.  MMT to have improved by one grade in all weak groups  Goal status: MET 09/30/23  3.  Will be able to ambulate  community distances without device and no increased pain to improve QOL  Baseline:  Goal status: MET for knee but is having limitations with ankle pain recently 09/30/23  4.  Will be able to ascend/descend flight of steps with rail with reciprocal pattern/no increased pain  Goal status: partially MET 09/30/23, can do this ascending but still does step to pattern to descend  5.  Pain in surgical knee to be no more than 5/10 at worst  Goal status: MET 09/30/23  6.  FOTO score to be within 5 points of predicted value by time of DC  Baseline:  Goal status: On going 09/30/23   PLAN:  PT FREQUENCY: 2x/week  PT DURATION: 8 weeks  PLANNED INTERVENTIONS: 97164- PT Re-evaluation, 97110-Therapeutic exercises, 97530- Therapeutic activity, [id number]- Neuromuscular re-education, 97535- Self Care, [id number]- Manual therapy, [id number]- Gait training, [phone]- Orthotic Fit/training, [id number]- Aquatic Therapy, 97014- Electrical stimulation (unattended), [id number]- Ultrasound, [id number]- Ionotophoresis 4mg /ml Dexamethasone, Taping, Dry Needling, Cryotherapy, and Moist heat  PLAN FOR NEXT SESSION: what did MD say, continue with knee strength and ROM while monitoring his ankle pain  Ivery Quale, PT, DPT 09/30/23 10:29 AM

## 2023-10-02 ENCOUNTER — Ambulatory Visit (INDEPENDENT_AMBULATORY_CARE_PROVIDER_SITE_OTHER): Payer: Medicare Other | Admitting: Orthopaedic Surgery

## 2023-10-02 ENCOUNTER — Ambulatory Visit (HOSPITAL_COMMUNITY)
Admission: RE | Admit: 2023-10-02 | Discharge: 2023-10-02 | Disposition: A | Discharge status: Home / Self Care | Payer: Medicare Other | Source: Ambulatory Visit | Attending: Orthopaedic Surgery | Admitting: Orthopaedic Surgery

## 2023-10-02 ENCOUNTER — Encounter: Payer: Self-pay | Admitting: Orthopaedic Surgery

## 2023-10-02 DIAGNOSIS — Z96651 Presence of right artificial knee joint: Secondary | ICD-10-CM

## 2023-10-02 NOTE — Progress Notes (Signed)
The patient is now 6 weeks tomorrow status post a right total knee arthroplasty.  He is really pushed himself hard through physical therapy and the therapist upstairs here have motivated him quite a bit and are doing well with him.  He reports excellent range of motion of his right knee at 6 weeks.  On exam I agree that his extension is full and his flexion is past 100 degrees.  His knee looks good overall.  It is swollen to be expected.  The bigger concern is he is having calf pain.  There is pain over the lateral aspect of his right leg but also in the calf area.  He does report foot and ankle swelling.  I would like to send him for a Doppler ultrasound of the right lower extremity to rule out a DVT.  If he does have a DVT he will get into the DVT clinic.  From my standpoint we will see him back in 3 months with a standing AP and lateral of his right knee.  All question concerns were answered and addressed.

## 2023-10-03 ENCOUNTER — Encounter: Payer: Self-pay | Admitting: Physical Therapy

## 2023-10-03 ENCOUNTER — Ambulatory Visit: Payer: Medicare Other | Admitting: Physical Therapy

## 2023-10-03 DIAGNOSIS — M25561 Pain in right knee: Secondary | ICD-10-CM | POA: Diagnosis not present

## 2023-10-03 DIAGNOSIS — M25661 Stiffness of right knee, not elsewhere classified: Secondary | ICD-10-CM | POA: Diagnosis not present

## 2023-10-03 DIAGNOSIS — R262 Difficulty in walking, not elsewhere classified: Secondary | ICD-10-CM

## 2023-10-03 DIAGNOSIS — M6281 Muscle weakness (generalized): Secondary | ICD-10-CM | POA: Diagnosis not present

## 2023-10-03 DIAGNOSIS — R600 Localized edema: Secondary | ICD-10-CM

## 2023-10-03 NOTE — Therapy (Signed)
OUTPATIENT PHYSICAL THERAPY LOWER EXTREMITY TREATMENT    Patient Name: Alexander [name] MRN: [id number] DOB:Apr 09, 1951, 73 y.o., male Today's Date: 10/03/2023  END OF SESSION:  PT End of Session - 10/03/23 1007     Visit Number 9    Number of Visits 17    Date for PT Re-Evaluation 10/31/23    Authorization Type MCR/[employer]    Progress Note Due on Visit 18    PT Start Time 0931    PT Stop Time 1010    PT Time Calculation (min) 39 min    Activity Tolerance Patient tolerated treatment well    Behavior During Therapy WFL for tasks assessed/performed                  Past Medical History:  Diagnosis Date   Allergy    Cancer (HCC)    skin cancer to upper chest/basal cell 2007   Cardiomyopathy (HCC)    Chronic neck pain    DJD   Chronic pain in right foot    Complication of anesthesia    slow to wake up after quadraceps surgery at [facility].   DDD (degenerative disc disease), lumbar    Depression    Dyslipidemia    Erectile dysfunction    Exogenous obesity    GERD (gastroesophageal reflux disease)    H/O acquired cardiomyopathy 1996   RESOLVED (apparently was thought to have had an MI, but did not have any intervention).  He developed cardiomyopathy that forced him to retire from [employer]..  The current EF 60-65%.   Heart murmur    History of fracture due to fall 1988   Bilateral wrist fractures   Hyperlipidemia    Hypertension    Hypogonadism male    IBS (irritable bowel syndrome)    Obstructive sleep apnea    cpap since 1999   Pre-diabetes    on Metformin DAily with supper   Past Surgical History:  Procedure Laterality Date   CARDIAC CATHETERIZATION  2000   Nonobstructive disease.  Persistently reduced EF   CARPAL TUNNEL RELEASE Left    CARPAL TUNNEL RELEASE Right 01/05/2019   Procedure: RIGHT CARPAL TUNNEL RELEASE;  Surgeon: Betha Loa, MD;  Location: [facility];  Service: Orthopedics;  Laterality: Right;  Bier block    COLONOSCOPY     CORONARY CT ANGIOGRAM  07/2017   Coronary calcium score 0?.  Focal noncalcific plaque (30%) in proximal LAD.  Otherwise no significant CAD.  Normal aortic root (3.2 cm)   HAMMER TOE SURGERY Bilateral    ingrown toenail surgery Bilateral    2017   IR RADIOLOGY PERIPHERAL GUIDED IV START  07/25/2017   IR US GUIDE VASC ACCESS RIGHT  07/25/2017   MENISCUS REPAIR Right    2016   ORIF WRIST FRACTURE Bilateral    PLANTAR FASCIA RELEASE Right    POLYPECTOMY     SHOULDER ARTHROSCOPY Left    TENDON REPAIR Right    right quad tendon repair   Tilt Table Test     Noted to have vasovagal syncope   TOTAL HIP ARTHROPLASTY Left 12/08/2020   Procedure: LEFT TOTAL HIP ARTHROPLASTY ANTERIOR APPROACH;  Surgeon: Kathryne Hitch, MD;  Location: [facility];  Service: Orthopedics;  Laterality: Left;  Needs RNFA   TOTAL HIP ARTHROPLASTY Right 03/30/2021   Procedure: RIGHT TOTAL HIP ARTHROPLASTY ANTERIOR APPROACH;  Surgeon: Kathryne Hitch, MD;  Location: [facility];  Service: Orthopedics;  Laterality: Right;   TOTAL KNEE  ARTHROPLASTY Right 08/22/2023   Procedure: RIGHT TOTAL KNEE ARTHROPLASTY;  Surgeon: Kathryne Hitch, MD;  Location: [facility];  Service: Orthopedics;  Laterality: Right;   TRANSTHORACIC ECHOCARDIOGRAM  06/21/2015   Normal LV function.  EF 57%.  Mild left atrial enlargement, trace AI, trace MR, mild PI.   TRANSTHORACIC ECHOCARDIOGRAM  06/2017   EF 60-65%.  Mild diastolic dysfunction.  No significant valvular abnormality.  Mild aortic calcification.   WISDOM TOOTH EXTRACTION Bilateral    Patient Active Problem List   Diagnosis Date Noted   Status post total right knee replacement 08/22/2023   OSA on CPAP 08/10/2023   Status post total replacement of right hip 03/30/2021   Hyperlipidemia due to dietary fat intake 10/01/2019   Morbid obesity (HCC) 10/01/2019   Excessive daytime sleepiness 03/03/2018   Obstructive sleep apnea treated with bilevel positive airway  pressure (BiPAP) 03/03/2018   Sleep paralysis, recurrent isolated 03/03/2018   Cataplexy 03/03/2018   Abnormal dreams 03/03/2018   Breathholding 03/03/2018   Hypersomnia, persistent 09/03/2017   Exertional dyspnea 06/20/2017   Vasovagal syncope 06/20/2017   Essential hypertension 06/20/2017   History of cardiomyopathy 06/20/2017    PCP: Jarome Matin MD   REFERRING PROVIDER: Kathryne Hitch, MD  REFERRING DIAG: Diagnosis M17.11 (ICD-10-CM) - Unilateral primary osteoarthritis, right knee  THERAPY DIAG:  Acute pain of right knee  Muscle weakness (generalized)  Difficulty in walking, not elsewhere classified  Stiffness of right knee, not elsewhere classified  Localized edema  Rationale for Evaluation and Treatment: Rehabilitation  ONSET DATE: TKR surgery 08/22/23  SUBJECTIVE:   SUBJECTIVE STATEMENT:  Doctor was happy with my knee, he did a [place] for the DVT and it was negative. Backed off on the leg press at home because I just don't know what's making the ankle swell and hurt. Now wearing a compression stocking.       PERTINENT HISTORY: See above   PAIN:  Are you having pain?  1/10 Pain location: R knee and ITB, some in calf and HS too  Pain description: discomfort from swelling  Aggravating factors: not walking, sitting for too long   Relieving factors: movement/walking, ice and elevation  PRECAUTIONS: None  RED FLAGS: None   WEIGHT BEARING RESTRICTIONS: No  FALLS:  Has patient fallen in last 6 months? 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    PATIENT SURVEYS:  FOTO 38, predicted 55 in 12 visits   COGNITION: Overall cognitive status: Within functional limits for tasks assessed     SENSATION: Not tested  EDEMA:  Localized edema appropriate for post-op state  LOWER EXTREMITY ROM:  Active ROM Right eval Right 09/10/23 Rt 09/16/23 Rt 09/18/23 Rt 09/23/23 Rt 09/30/23  Hip flexion        Hip extension        Hip abduction  Hip adduction        Hip internal rotation        Hip external rotation        Knee flexion Seated edge of mat table 62*  Supine AROM 60*, AAROM 63*  Standing //bar: 87*  Supine AA: 102 A:102 P:105 A:105 P:110 A:105 P:113  Knee extension Seated LAQ 12*   Supine heel prop/quad set   8*  P: -8  A:6 P:5 A:3  Ankle dorsiflexion        Ankle plantarflexion        Ankle inversion        Ankle eversion         (Blank rows = not tested)  LOWER EXTREMITY MMT:  MMT Right eval Left eval Right 09/30/23  Hip flexion     Hip extension     Hip abduction     Hip adduction     Hip internal rotation     Hip external rotation     Knee flexion 4 5 5   Knee extension 4+ 5 5  Ankle dorsiflexion   4  Ankle plantarflexion   5  Ankle inversion   4  Ankle eversion   4   (Blank rows = not tested)  FUNCTIONAL TESTS:  Eval:   5xSTS 24 seconds UEs on chair  09/30/23: SLS 5 sec average of 3  GAIT: Eval:  Distance walked: in clinic distances  Assistive device utilized: Environmental consultant - 2 wheeled Level of assistance: Modified independence Comments: antalgic, favors R LE, limited R knee ROM with gait cycle                                                                                                                                 TREATMENT  DATE:    10/03/23   Scifit seat 11 L3 x10 minutes for w/u, ROM, functional activity tolerance   Percussion gun to R calf with towel for padding  Noted LLD with R LE being about 3/4 cm, we tried placing a heel lift to see if this helps his gait pattern and calf pain- had immediate improvement in gait pattern and no ankle pain upon getting up to walk  Education on possible benefits of ionto and how it works, benefit of heel lift and how it might affect ankle pain and gait pattern overall, encouraged him to keep up with exercises as discussed at last visit, also encouraged not walking barefoot and staying in shoes with heel lift so we can assess if it is helping      09/30/23:  TherEx:  Sci fit bike X 10 for ROM and endurance  Physical performance testing Updated ROM measurements and strength assessments, Single leg stance tested with 5 seconds average on Rt LE. Held 5 times sit to stand test due to ankle pain  Theractivity (strength and ROM for stairs, squats, sit to stands)  Leg extension machine SL 10# X 10, then  DL 16# [id number]  Hamstring curl machine DL 04# 5W09 Single leg stance 15 sec X 4  Manual:  PROM knee flexion and extension with over pressure supine   09/26/23  Bike seat 11 x10 minutes full rotations for ROM, tissue perfusion Forward step ups 6 inch box x15 R LE  Lateral step ups 6 inch box x15  R LE  Forward heel taps R LE on box x15  Shuttle press BLEs 87# x12 Shuttle press R LE only 50# x12     Knee flexion stretch 12x5 second holds  LAQs 4# x15  Gastroc stretches on door way R LE 3x30 seconds  HS stretches 3x30 seconds RLE 3x30 seconds    PATIENT EDUCATION:  Education details: exam findings, POC, HEP  Person educated: Patient Education method: Programmer, multimedia, Demonstration, and Handouts Education comprehension: verbalized understanding, returned demonstration, and needs further education  HOME EXERCISE PROGRAM:  Access Code: [email or web address] URL:  [email or web address] Date: 09/08/2023 Prepared by: Nedra Hai  Exercises - Seated Knee Flexion Extension AAROM with Overpressure  - 3 x daily - 7 x weekly - 1 sets - 10-15 reps - 5 seconds  hold - Seated Passive Knee Extension  - 3 x daily - 7 x weekly - 1 sets - 1 reps - 3-5 minutes  hold - Seated Small Alternating Straight Leg Lifts  - 3 x daily - 7 x weekly - 1 sets - 10-15 reps - Short Arc Quad with Ankle Weight  - 3 x daily - 7 x weekly - 1 sets - 10 reps - 3 seconds  hold  ASSESSMENT:  CLINICAL IMPRESSION:  Pt arrives today doing well, pleased that scan for DVT was negative. Focused more ankle pain today, but  noted significant improvement in pain and gait pattern when heel lift was placed today. Took it easy today, encouraged and educated as above.     OBJECTIVE IMPAIRMENTS: Abnormal gait, decreased activity tolerance, decreased balance, decreased knowledge of use of DME, decreased mobility, difficulty walking, decreased ROM, decreased strength, hypomobility, increased edema, increased fascial restrictions, and pain.   ACTIVITY LIMITATIONS: sitting, standing, squatting, sleeping, stairs, transfers, and locomotion level  PARTICIPATION LIMITATIONS: driving, shopping, community activity, occupation, and yard work  PERSONAL FACTORS: Age, Education, Financial risk analyst, Past/current experiences, Profession, Social background, and Time since onset of injury/illness/exacerbation are also affecting patient's functional outcome.   REHAB POTENTIAL: Good  CLINICAL DECISION MAKING: Stable/uncomplicated  EVALUATION COMPLEXITY: Low   GOALS: Goals reviewed with patient? Yes  SHORT TERM GOALS: Target date: 10/03/2023   Will be complaint with appropriate progressive HEP  Goal status: MET 09/16/23  2.  Surgical knee extension AROM to be less than 5* Goal status: On going 09/16/23  3.  Surgical knee flexion AROM to be at least 90* Goal status: Met 09/17/23  4.  Will be  independent with edema management techniques  Goal status: On going 09/30/23    LONG TERM GOALS: Target date: 10/31/2023    Surgical knee flexion AROM to be at least 105* Goal status: MET 09/30/23  2.  MMT to have improved by one grade in all weak groups  Goal status: MET 09/30/23  3.  Will be able to ambulate community distances without device and no increased pain to improve QOL  Baseline:  Goal status: MET for knee but is having limitations with ankle pain recently 09/30/23  4.  Will be able to ascend/descend flight of steps with rail with reciprocal pattern/no increased pain  Goal status: partially MET 09/30/23, can do  this ascending but still does step to pattern to descend  5.  Pain in surgical knee to be no more than 5/10 at worst  Goal status: MET 09/30/23  6.  FOTO score to be within 5 points of predicted value by time of DC  Baseline:  Goal status: On going 09/30/23   PLAN:  PT FREQUENCY: 2x/week  PT DURATION: 8 weeks  PLANNED INTERVENTIONS: 97164- PT Re-evaluation, 97110-Therapeutic exercises, 97530- Therapeutic activity, 97112- Neuromuscular re-education, 97535- Self Care, [id number]- Manual therapy, [id number]- Gait training, [phone]- Orthotic Fit/training, [id number]- Aquatic Therapy, 97014- Electrical stimulation (unattended), [id number]- Ultrasound, 84166- Ionotophoresis 4mg /ml Dexamethasone, Taping, Dry Needling, Cryotherapy, and Moist heat  PLAN FOR NEXT SESSION: what did MD say, continue with knee strength and ROM while monitoring his ankle pain  Nedra Hai, PT, DPT 10/03/23 10:26 AM

## 2023-10-07 ENCOUNTER — Ambulatory Visit (INDEPENDENT_AMBULATORY_CARE_PROVIDER_SITE_OTHER): Payer: Medicare Other | Admitting: Rehabilitative and Restorative Service Providers"

## 2023-10-07 ENCOUNTER — Encounter: Payer: Self-pay | Admitting: Rehabilitative and Restorative Service Providers"

## 2023-10-07 DIAGNOSIS — R262 Difficulty in walking, not elsewhere classified: Secondary | ICD-10-CM | POA: Diagnosis not present

## 2023-10-07 DIAGNOSIS — M25561 Pain in right knee: Secondary | ICD-10-CM

## 2023-10-07 DIAGNOSIS — M25661 Stiffness of right knee, not elsewhere classified: Secondary | ICD-10-CM | POA: Diagnosis not present

## 2023-10-07 DIAGNOSIS — M6281 Muscle weakness (generalized): Secondary | ICD-10-CM | POA: Diagnosis not present

## 2023-10-07 DIAGNOSIS — R600 Localized edema: Secondary | ICD-10-CM | POA: Diagnosis not present

## 2023-10-07 NOTE — Therapy (Signed)
OUTPATIENT PHYSICAL THERAPY LOWER EXTREMITY TREATMENT    Patient Name: Alexander [name] MRN: [id number] DOB:[birth year], 73 y.o., male Today's Date: 10/07/2023  END OF SESSION:  PT End of Session - 10/07/23 1108     Visit Number 10    Number of Visits 17    Date for PT Re-Evaluation 10/31/23    Authorization Type MCR/[employer]    Progress Note Due on Visit 18    PT Start Time 1000    PT Stop Time 1015    PT Time Calculation (min) 15 min    Activity Tolerance Patient tolerated treatment well;No increased pain    Behavior During Therapy WFL for tasks assessed/performed             Past Medical History:  Diagnosis Date   Allergy    Cancer (HCC)    skin cancer to upper chest/basal cell 2007   Cardiomyopathy [facility])    Chronic neck pain    DJD   Chronic pain in right foot    Complication of anesthesia    slow to wake up after quadraceps surgery at [facility].   DDD (degenerative disc disease), lumbar    Depression    Dyslipidemia    Erectile dysfunction    Exogenous obesity    GERD (gastroesophageal reflux disease)    H/O acquired cardiomyopathy 1996   RESOLVED (apparently was thought to have had an MI, but did not have any intervention).  He developed cardiomyopathy that forced him to retire from [employer]..  The current EF 60-65%.   Heart murmur    History of fracture due to fall 1988   Bilateral wrist fractures   Hyperlipidemia    Hypertension    Hypogonadism male    IBS (irritable bowel syndrome)    Obstructive sleep apnea    cpap since 1999   Pre-diabetes    on Metformin DAily with supper   Past Surgical History:  Procedure Laterality Date   CARDIAC CATHETERIZATION  2000   Nonobstructive disease.  Persistently reduced EF   CARPAL TUNNEL RELEASE Left    CARPAL TUNNEL RELEASE Right 01/05/2019   Procedure: RIGHT CARPAL TUNNEL RELEASE;  Surgeon: Alexander Loa, MD;  Location: [facility];  Service: Orthopedics;  Laterality: Right;  Bier block    COLONOSCOPY     CORONARY CT ANGIOGRAM  07/2017   Coronary calcium score 0?.  Focal noncalcific plaque (30%) in proximal LAD.  Otherwise no significant CAD.  Normal aortic root (3.2 cm)   HAMMER TOE SURGERY Bilateral    ingrown toenail surgery Bilateral    2017   IR RADIOLOGY PERIPHERAL GUIDED IV START  07/25/2017   IR US GUIDE VASC ACCESS RIGHT  07/25/2017   MENISCUS REPAIR Right    2016   ORIF WRIST FRACTURE Bilateral    PLANTAR FASCIA RELEASE Right    POLYPECTOMY     SHOULDER ARTHROSCOPY Left    TENDON REPAIR Right    right quad tendon repair   Tilt Table Test     Noted to have vasovagal syncope   TOTAL HIP ARTHROPLASTY Left 12/08/2020   Procedure: LEFT TOTAL HIP ARTHROPLASTY ANTERIOR APPROACH;  Surgeon: Alexander Hitch, MD;  Location: [facility];  Service: Orthopedics;  Laterality: Left;  Needs RNFA   TOTAL HIP ARTHROPLASTY Right 03/30/2021   Procedure: RIGHT TOTAL HIP ARTHROPLASTY ANTERIOR APPROACH;  Surgeon: Alexander Hitch, MD;  Location: [facility];  Service: Orthopedics;  Laterality: Right;   TOTAL KNEE ARTHROPLASTY Right 08/22/2023  Procedure: RIGHT TOTAL KNEE ARTHROPLASTY;  Surgeon: Alexander Hitch, MD;  Location: [facility];  Service: Orthopedics;  Laterality: Right;   TRANSTHORACIC ECHOCARDIOGRAM  06/21/2015   Normal LV function.  EF 57%.  Mild left atrial enlargement, trace AI, trace MR, mild PI.   TRANSTHORACIC ECHOCARDIOGRAM  06/2017   EF 60-65%.  Mild diastolic dysfunction.  No significant valvular abnormality.  Mild aortic calcification.   WISDOM TOOTH EXTRACTION Bilateral    Patient Active Problem List   Diagnosis Date Noted   Status post total right knee replacement 08/22/2023   OSA on CPAP 08/10/2023   Status post total replacement of right hip 03/30/2021   Hyperlipidemia due to dietary fat intake 10/01/2019   Morbid obesity (HCC) 10/01/2019   Excessive daytime sleepiness 03/03/2018   Obstructive sleep apnea treated with bilevel positive airway  pressure (BiPAP) 03/03/2018   Sleep paralysis, recurrent isolated 03/03/2018   Cataplexy 03/03/2018   Abnormal dreams 03/03/2018   Breathholding 03/03/2018   Hypersomnia, persistent 09/03/2017   Exertional dyspnea 06/20/2017   Vasovagal syncope 06/20/2017   Essential hypertension 06/20/2017   History of cardiomyopathy 06/20/2017    PCP: Alexander Matin MD   REFERRING PROVIDER: Kathryne Hitch, MD  REFERRING DIAG: Diagnosis M17.11 (ICD-10-CM) - Unilateral primary osteoarthritis, right knee  THERAPY DIAG:  Acute pain of right knee  Muscle weakness (generalized)  Stiffness of right knee, not elsewhere classified  Localized edema  Difficulty in walking, not elsewhere classified  Rationale for Evaluation and Treatment: Rehabilitation  ONSET DATE: TKR surgery 08/22/23  SUBJECTIVE:   SUBJECTIVE STATEMENT: DVT was ruled out.  Alexander [name] was 30 minutes late today but still wanted to be seen to get questions answered.  PERTINENT HISTORY: See above   PAIN:  Are you having pain?  1/10 Pain location: R knee and ITB, some in calf and HS too  Pain description: discomfort from swelling  Aggravating factors: not walking, sitting for too long   Relieving factors: movement/walking, ice and elevation  PRECAUTIONS: None  RED FLAGS: None   WEIGHT BEARING RESTRICTIONS: No  FALLS:  Has patient fallen in last 6 months? 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  PATIENT SURVEYS:  FOTO 38, predicted 55 in 12 visits   COGNITION: Overall cognitive status: Within functional limits for tasks assessed     SENSATION: Not tested  EDEMA:  Localized edema appropriate for post-op state  LOWER EXTREMITY ROM:  Active ROM Right eval Right 09/10/23 Rt 09/16/23 Rt 09/18/23 Rt 09/23/23 Rt 09/30/23  Hip flexion        Hip extension        Hip abduction        Hip adduction        Hip internal rotation        Hip external rotation        Knee flexion Seated  edge of mat table 62*  Supine AROM 60*, AAROM 63*  Standing //bar: 87*  Supine AA: 102 A:102 P:105 A:105 P:110 A:105 P:113  Knee extension Seated LAQ 12*   Supine heel prop/quad set   8*  P: -8  A:6 P:5 A:3  Ankle dorsiflexion        Ankle plantarflexion        Ankle inversion        Ankle eversion         (Blank rows = not tested)  LOWER EXTREMITY MMT:  MMT Right eval Left eval Right 09/30/23  Hip flexion     Hip extension     Hip abduction     Hip adduction     Hip internal rotation     Hip external rotation     Knee flexion 4 5 5   Knee extension 4+ 5 5  Ankle dorsiflexion   4  Ankle plantarflexion   5  Ankle inversion   4  Ankle eversion   4   (Blank rows = not tested)  FUNCTIONAL TESTS:  Eval:   5xSTS 24 seconds UEs on chair  09/30/23: SLS 5 sec average of 3  GAIT: Eval:  Distance walked: in clinic distances  Assistive device utilized: Environmental consultant - 2 wheeled Level of assistance: Modified independence Comments: antalgic, favors R LE, limited R knee ROM with gait cycle                                                                                                                                TREATMENT DATE:  10/07/2023 Recumbent bike Seat 11 Level 4-8 while we discussed his HEP (including home gym activities) Upper  & Lower heel cords box 1 minute each Upper and Lower heel cords stretch (slight toe in) 1 x 20 seconds Discussed hamstrings stretching but did not have time to review   10/03/23 Scifit seat 11 L3 x10 minutes for w/u, ROM, functional activity tolerance   Percussion gun to R calf with towel for padding  Noted LLD with R LE being about 3/4 cm, we tried placing a heel lift to see if this helps his gait pattern and calf pain- had immediate improvement in gait pattern and no ankle pain upon getting up to walk  Education on possible benefits of ionto and how it works, benefit of heel lift and how it might affect ankle pain and gait pattern overall, encouraged him to keep up with exercises as discussed at last visit, also encouraged not walking barefoot and staying in shoes with heel lift so we can assess if it is helping    09/30/23:  TherEx:  Sci fit bike X 10 for ROM and endurance  Physical performance testing Updated ROM measurements and strength assessments, Single leg stance tested with 5 seconds average on Rt LE. Held 5 times sit to stand test due to ankle pain  Theractivity (strength and ROM for stairs, squats, sit  to stands)  Leg extension machine SL 10# X 10, then DL 16# [id number]  Hamstring curl machine DL 04# 5W09 Single leg stance 15 sec X 4  Manual:  PROM knee flexion and extension with over pressure supine   PATIENT EDUCATION:  Education details: exam findings, POC, HEP  Person educated: Patient Education method: Explanation, Demonstration, and Handouts Education comprehension: verbalized understanding, returned demonstration, and needs further education  HOME EXERCISE PROGRAM: Access Code: [email or web address] URL: [email or web address] Date: 10/07/2023 Prepared by: Pauletta Browns  Exercises - Seated Knee Flexion Extension AAROM with Overpressure  - 3 x daily - 7 x weekly - 1 sets - 10-15 reps - 5 seconds  hold - Seated Passive Knee Extension  - 3 x daily - 7 x weekly  - 1 sets - 1 reps - 3-5 minutes  hold - Seated Small Alternating Straight Leg Lifts  - 3 x daily - 7 x weekly - 1 sets - 10-15 reps - Short Arc Quad with Ankle Weight  - 3 x daily - 7 x weekly - 1 sets - 10 reps - 3 seconds  hold - Standing Gastroc Stretch  - 2 x daily - 7 x weekly - 1 sets - 4-5 reps - 20 seconds hold - Standing Soleus Stretch  - 2 x daily - 7 x weekly - 1 sets - 4-5 reps - 20 hold   ASSESSMENT:  CLINICAL IMPRESSION: Even though he was 30 minutes late, Kayshawn wanted to be seen today to discuss his right calf pain.  DVT was ruled out and the addition of a heel lift last visit helped immensely.  We added some heel cords stretching he can do at home to help with calf pain, improve knee extension AROM and allow him to meet all long-term goals established at evaluation.   OBJECTIVE IMPAIRMENTS: Abnormal gait, decreased activity tolerance, decreased balance, decreased knowledge of use of DME, decreased mobility, difficulty walking, decreased ROM, decreased strength, hypomobility, increased edema, increased fascial restrictions, and pain.   ACTIVITY LIMITATIONS: sitting, standing, squatting, sleeping, stairs, transfers, and locomotion level  PARTICIPATION LIMITATIONS: driving, shopping, community activity, occupation, and yard work  PERSONAL FACTORS: Age, Education, Financial risk analyst, Past/current experiences, Profession, Social background, and Time since onset of injury/illness/exacerbation are also affecting patient's functional outcome.   REHAB POTENTIAL: Good  CLINICAL DECISION MAKING: Stable/uncomplicated  EVALUATION COMPLEXITY: Low   GOALS: Goals reviewed with patient? Yes  SHORT TERM GOALS: Target date: 10/03/2023   Will be complaint with appropriate progressive HEP  Goal status: MET 09/16/23  2.  Surgical knee extension AROM to be less than 5* Goal status: On going 10/07/23  3.  Surgical knee flexion AROM to be at least 90* Goal status: Met 09/17/23  4.  Will be  independent with edema management techniques  Goal status: On going 10/07/23    LONG TERM GOALS: Target date: 10/31/2023    Surgical knee flexion AROM to be at least 105* Goal status: MET 09/30/23  2.  MMT to have improved by one grade in all weak groups  Goal status: MET 09/30/23  3.  Will be able to ambulate community distances without device and no increased pain to improve QOL  Baseline:  Goal status: MET for knee but is having limitations with ankle pain recently 09/30/23  4.  Will be able to ascend/descend flight of steps with rail with reciprocal pattern/no increased pain  Goal status: partially MET 09/30/23, can do this ascending but still does step to pattern to descend  5.  Pain in surgical knee to be no more than 5/10 at worst  Goal status: MET 09/30/23  6.  FOTO score to be within 5 points of predicted value by time of DC  Baseline:  Goal status: On going 09/30/23   PLAN:  PT FREQUENCY: 2x/week  PT DURATION: 8 weeks  PLANNED INTERVENTIONS: 97164- PT Re-evaluation, 97110-Therapeutic exercises, 97530- Therapeutic activity, 97112- Neuromuscular re-education, 97535- Self Care, 95621- Manual therapy, [id number]- Gait training, [phone]- Orthotic Fit/training, [id number]- Aquatic Therapy, 97014- Electrical stimulation (unattended), [id number]- Ultrasound, [id number]- Ionotophoresis 4mg /ml Dexamethasone, Taping, Dry Needling, Cryotherapy, and Moist heat  PLAN FOR NEXT SESSION: Measure next visit (short visit 10/07/2023).  See how new stretches went.  Cherlyn Cushing PT, MPT 10/07/23 12:41 PM

## 2023-10-10 ENCOUNTER — Encounter: Payer: Self-pay | Admitting: Physical Therapy

## 2023-10-10 ENCOUNTER — Ambulatory Visit: Payer: Medicare Other | Admitting: Physical Therapy

## 2023-10-10 DIAGNOSIS — M6281 Muscle weakness (generalized): Secondary | ICD-10-CM

## 2023-10-10 DIAGNOSIS — R600 Localized edema: Secondary | ICD-10-CM

## 2023-10-10 DIAGNOSIS — M25561 Pain in right knee: Secondary | ICD-10-CM | POA: Diagnosis not present

## 2023-10-10 DIAGNOSIS — R262 Difficulty in walking, not elsewhere classified: Secondary | ICD-10-CM

## 2023-10-10 DIAGNOSIS — M25661 Stiffness of right knee, not elsewhere classified: Secondary | ICD-10-CM

## 2023-10-10 NOTE — Therapy (Signed)
OUTPATIENT PHYSICAL THERAPY LOWER EXTREMITY TREATMENT    Patient Name: Alexander [name] MRN: [id number] DOB:1951-03-13, 73 y.o., male Today's Date: 10/10/2023  END OF SESSION:  PT End of Session - 10/10/23 0944     Visit Number 11    Number of Visits 17    Date for PT Re-Evaluation 10/31/23    Authorization Type MCR/[employer]    Authorization Time Period 09/05/23 to 10/31/23    Progress Note Due on Visit 18    PT Start Time 0934    PT Stop Time 1012    PT Time Calculation (min) 38 min    Activity Tolerance Patient tolerated treatment well;No increased pain    Behavior During Therapy WFL for tasks assessed/performed              Past Medical History:  Diagnosis Date   Allergy    Cancer (HCC)    skin cancer to upper chest/basal cell 2007   Cardiomyopathy [facility])    Chronic neck pain    DJD   Chronic pain in right foot    Complication of anesthesia    slow to wake up after quadraceps surgery at [facility].   DDD (degenerative disc disease), lumbar    Depression    Dyslipidemia    Erectile dysfunction    Exogenous obesity    GERD (gastroesophageal reflux disease)    H/O acquired cardiomyopathy 1996   RESOLVED (apparently was thought to have had an MI, but did not have any intervention).  He developed cardiomyopathy that forced him to retire from [employer]..  The current EF 60-65%.   Heart murmur    History of fracture due to fall 1988   Bilateral wrist fractures   Hyperlipidemia    Hypertension    Hypogonadism male    IBS (irritable bowel syndrome)    Obstructive sleep apnea    cpap since 1999   Pre-diabetes    on Metformin DAily with supper   Past Surgical History:  Procedure Laterality Date   CARDIAC CATHETERIZATION  2000   Nonobstructive disease.  Persistently reduced EF   CARPAL TUNNEL RELEASE Left    CARPAL TUNNEL RELEASE Right 01/05/2019   Procedure: RIGHT CARPAL TUNNEL RELEASE;  Surgeon: Betha Loa, MD;  Location: [facility];   Service: Orthopedics;  Laterality: Right;  Bier block   COLONOSCOPY     CORONARY CT ANGIOGRAM  07/2017   Coronary calcium score 0?.  Focal noncalcific plaque (30%) in proximal LAD.  Otherwise no significant CAD.  Normal aortic root (3.2 cm)   HAMMER TOE SURGERY Bilateral    ingrown toenail surgery Bilateral    2017   IR RADIOLOGY PERIPHERAL GUIDED IV START  07/25/2017   IR US GUIDE VASC ACCESS RIGHT  07/25/2017   MENISCUS REPAIR Right    2016   ORIF WRIST FRACTURE Bilateral    PLANTAR FASCIA RELEASE Right    POLYPECTOMY     SHOULDER ARTHROSCOPY Left    TENDON REPAIR Right    right quad tendon repair   Tilt Table Test     Noted to have vasovagal syncope   TOTAL HIP ARTHROPLASTY Left 12/08/2020   Procedure: LEFT TOTAL HIP ARTHROPLASTY ANTERIOR APPROACH;  Surgeon: Kathryne Hitch, MD;  Location: [facility];  Service: Orthopedics;  Laterality: Left;  Needs RNFA   TOTAL HIP ARTHROPLASTY Right 03/30/2021   Procedure: RIGHT TOTAL HIP ARTHROPLASTY ANTERIOR APPROACH;  Surgeon: Kathryne Hitch, MD;  Location: [facility];  Service: Orthopedics;  Laterality: Right;   TOTAL KNEE ARTHROPLASTY Right 08/22/2023   Procedure: RIGHT TOTAL KNEE ARTHROPLASTY;  Surgeon: Kathryne Hitch, MD;  Location: [facility];  Service: Orthopedics;  Laterality: Right;   TRANSTHORACIC ECHOCARDIOGRAM  06/21/2015   Normal LV function.  EF 57%.  Mild left atrial enlargement, trace AI, trace MR, mild PI.   TRANSTHORACIC ECHOCARDIOGRAM  06/2017   EF 60-65%.  Mild diastolic dysfunction.  No significant valvular abnormality.  Mild aortic calcification.   WISDOM TOOTH EXTRACTION Bilateral    Patient Active Problem List   Diagnosis Date Noted   Status post total right knee replacement 08/22/2023   OSA on CPAP 08/10/2023   Status post total replacement of right hip 03/30/2021   Hyperlipidemia due to dietary fat intake 10/01/2019   Morbid obesity (HCC) 10/01/2019   Excessive daytime sleepiness 03/03/2018    Obstructive sleep apnea treated with bilevel positive airway pressure (BiPAP) 03/03/2018   Sleep paralysis, recurrent isolated 03/03/2018   Cataplexy 03/03/2018   Abnormal dreams 03/03/2018   Breathholding 03/03/2018   Hypersomnia, persistent 09/03/2017   Exertional dyspnea 06/20/2017   Vasovagal syncope 06/20/2017   Essential hypertension 06/20/2017   History of cardiomyopathy 06/20/2017    PCP: Jarome Matin MD   REFERRING PROVIDER: Kathryne Hitch, MD  REFERRING DIAG: Diagnosis M17.11 (ICD-10-CM) - Unilateral primary osteoarthritis, right knee  THERAPY DIAG:  Acute pain of right knee  Muscle weakness (generalized)  Stiffness of right knee, not elsewhere classified  Localized edema  Difficulty in walking, not elsewhere classified  Rationale for Evaluation and Treatment: Rehabilitation  ONSET DATE: TKR surgery 08/22/23  SUBJECTIVE:   SUBJECTIVE STATEMENT:  Not much new, just pressing forward.   PERTINENT HISTORY: See above   PAIN:  Are you having pain?  3/10 Pain location: R ankle and down side of R calf  Pain description: ache  Aggravating factors: not walking, sitting for too long   Relieving factors: movement/walking, ice and elevation  PRECAUTIONS: None  RED FLAGS: None   WEIGHT BEARING RESTRICTIONS: No  FALLS:  Has patient fallen in last 6 months? 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  PATIENT SURVEYS:  FOTO 38, predicted 55 in 12 visits   COGNITION: Overall cognitive status: Within functional limits for tasks assessed     SENSATION: Not tested  EDEMA:  Localized edema appropriate for post-op state  LOWER EXTREMITY ROM:  Active ROM Right eval Right 09/10/23 Rt 09/16/23 Rt 09/18/23 Rt 09/23/23 Rt 09/30/23 Rt 10/10/23  Hip flexion         Hip extension         Hip abduction         Hip adduction         Hip internal rotation         Hip external rotation         Knee  flexion Seated edge of mat table 62*  Supine AROM 60*, AAROM 63*  Standing //bar: 87*  Supine AA: 102 A:102 P:105 A:105 P:110 A:105 P:113 A: 99*, P 107*; after exercises able to improve to A: 105*/P113*  Knee extension Seated LAQ 12*   Supine heel prop/quad set   8*  P: -8  A:6 P:5 A:3 A: -4*  Ankle dorsiflexion         Ankle plantarflexion         Ankle inversion         Ankle eversion          (Blank rows = not tested)  LOWER EXTREMITY MMT:  MMT Right eval Left eval Right 09/30/23  Hip flexion     Hip extension     Hip abduction     Hip adduction     Hip internal rotation     Hip external rotation     Knee flexion 4 5 5   Knee extension 4+ 5 5  Ankle dorsiflexion   4  Ankle plantarflexion   5  Ankle inversion   4  Ankle eversion   4   (Blank rows = not tested)  FUNCTIONAL TESTS:  Eval:   5xSTS 24 seconds UEs on chair  09/30/23: SLS 5 sec average of 3  GAIT: Eval:  Distance walked: in clinic distances  Assistive device utilized: Environmental consultant - 2 wheeled Level of assistance: Modified independence Comments: antalgic, favors R LE, limited R knee ROM with gait cycle                                                                                                                                TREATMENT DATE:      10/10/23  Precor bike seat 11 full  rotations x10 minutes for w/u, ROM, tissue perfusion Knee flexion stretches 12x10 seconds  Forward steps 8 inch box R LE x10- still had some ankle pain  Forward lunges onto soft side of BOSU x12 R LE   Ionto anterior R ankle at the talar joint line 4 hour patch/associated ionto education          10/07/2023 Recumbent bike Seat 11 Level 4-8 while we discussed his HEP (including home gym activities) Upper & Lower heel cords box 1 minute each Upper and Lower heel cords stretch (slight toe in) 1 x 20 seconds Discussed hamstrings stretching but did not have time to review   10/03/23 Scifit seat 11 L3 x10 minutes for w/u, ROM, functional activity tolerance   Percussion gun to R calf with towel for padding  Noted LLD with R LE being about 3/4 cm, we tried placing a heel lift to see if this helps his gait pattern and calf pain- had immediate improvement in gait pattern and no ankle pain upon getting up to walk  Education on possible benefits of ionto and how it works, benefit of heel lift and how it might affect ankle  pain and gait pattern overall, encouraged him to keep up with exercises as discussed at last visit, also encouraged not walking barefoot and staying in shoes with heel lift so we can assess if it is helping    09/30/23:  TherEx:  Sci fit bike X 10 for ROM and endurance  Physical performance testing Updated ROM measurements and strength assessments, Single leg stance tested with 5 seconds average on Rt LE. Held 5 times sit to stand test due to ankle pain  Theractivity (strength and ROM for stairs, squats, sit to stands)  Leg extension machine SL 10# X 10, then DL 91# 4N82  Hamstring curl machine DL 95# 6O13 Single leg stance 15 sec X 4  Manual:  PROM knee flexion and extension with over pressure supine   PATIENT EDUCATION:  Education details: exam findings, POC, HEP  Person educated: Patient Education method: Explanation, Demonstration, and Handouts Education  comprehension: verbalized understanding, returned demonstration, and needs further education  HOME EXERCISE PROGRAM: Access Code: [email or web address] URL: [email or web address] Date: 10/07/2023 Prepared by: Pauletta Browns  Exercises - Seated Knee Flexion Extension AAROM with Overpressure  - 3 x daily - 7 x weekly - 1 sets - 10-15 reps - 5 seconds  hold - Seated Passive Knee Extension  - 3 x daily - 7 x weekly - 1 sets - 1 reps - 3-5 minutes  hold - Seated Small Alternating Straight Leg Lifts  - 3 x daily - 7 x weekly - 1 sets - 10-15 reps - Short Arc Quad with Ankle Weight  - 3 x daily - 7 x weekly - 1 sets - 10 reps - 3 seconds  hold - Standing Gastroc Stretch  - 2 x daily - 7 x weekly - 1 sets - 4-5 reps - 20 seconds hold - Standing Soleus Stretch  - 2 x daily - 7 x weekly - 1 sets - 4-5 reps - 20 hold   ASSESSMENT:  CLINICAL IMPRESSION:  Pt arrives today doing OK, he has been feeling a lot better since the heel lift. Still having some odd ankle joint pains which have been bothering him even when driving. Continued working on strength and ROM today as tolerated, he is progressing well overall but did educate a lot on reasonable expectations given that he is only about 7 weeks out. He was a bit more stiff today possibly due to the colder weather. Work will be starting soon, I think he could benefit from at last a few additional sessions prior to possible DC.    OBJECTIVE IMPAIRMENTS: Abnormal gait, decreased activity tolerance, decreased balance, decreased knowledge of use of DME, decreased mobility, difficulty walking, decreased ROM, decreased strength, hypomobility, increased edema, increased fascial restrictions, and pain.   ACTIVITY LIMITATIONS: sitting, standing, squatting, sleeping, stairs, transfers, and locomotion level  PARTICIPATION LIMITATIONS: driving, shopping, community activity, occupation, and yard work  PERSONAL FACTORS: Age, Education, Financial risk analyst, Past/current  experiences, Profession, Social background, and Time since onset of injury/illness/exacerbation are also affecting patient's functional outcome.   REHAB POTENTIAL: Good  CLINICAL DECISION MAKING: Stable/uncomplicated  EVALUATION COMPLEXITY: Low   GOALS: Goals reviewed with patient? Yes  SHORT TERM GOALS: Target date: 10/03/2023   Will be complaint with appropriate progressive HEP  Goal status: MET 09/16/23  2.  Surgical knee extension AROM to be less than 5* Goal status: On going 10/07/23  3.  Surgical knee flexion AROM to be at least 90* Goal status: Met 09/17/23  4.  Will be independent with edema management  techniques  Goal status: On going 10/07/23    LONG TERM GOALS: Target date: 10/31/2023    Surgical knee flexion AROM to be at least 105* Goal status: MET 09/30/23  2.  MMT to have improved by one grade in all weak groups  Goal status: MET 09/30/23  3.  Will be able to ambulate community distances without device and no increased pain to improve QOL  Baseline:  Goal status: MET for knee but is having limitations with ankle pain recently 09/30/23  4.  Will be able to ascend/descend flight of steps with rail with reciprocal pattern/no increased pain  Goal status: partially MET 09/30/23, can do this ascending but still does step to pattern to descend  5.  Pain in surgical knee to be no more than 5/10 at worst  Goal status: MET 09/30/23  6.  FOTO score to be within 5 points of predicted value by time of DC  Baseline:  Goal status: On going 09/30/23   PLAN:  PT FREQUENCY: 2x/week  PT DURATION: 8 weeks  PLANNED INTERVENTIONS: 97164- PT Re-evaluation, 97110-Therapeutic exercises, 97530- Therapeutic activity, 97112- Neuromuscular re-education, 97535- Self Care, [id number]- Manual therapy, [phone]- Gait training, [phone]- Orthotic Fit/training, [phone]- Aquatic Therapy, 97014- Electrical stimulation (unattended), [id number]- Ultrasound, [id number]- Ionotophoresis 4mg /ml Dexamethasone, Taping, Dry  Needling, Cryotherapy, and Moist heat  PLAN FOR NEXT SESSION: progress strength and ROM as appropriate, discuss POC- does he still want to continue into first week of march or does he prefer to DC at end of February?   Nedra Hai, PT, DPT 10/10/23 10:14 AM

## 2023-10-14 ENCOUNTER — Ambulatory Visit (INDEPENDENT_AMBULATORY_CARE_PROVIDER_SITE_OTHER): Payer: Medicare Other | Admitting: Physical Therapy

## 2023-10-14 ENCOUNTER — Encounter: Payer: Self-pay | Admitting: Physical Therapy

## 2023-10-14 DIAGNOSIS — M6281 Muscle weakness (generalized): Secondary | ICD-10-CM | POA: Diagnosis not present

## 2023-10-14 DIAGNOSIS — M25561 Pain in right knee: Secondary | ICD-10-CM | POA: Diagnosis not present

## 2023-10-14 DIAGNOSIS — M25661 Stiffness of right knee, not elsewhere classified: Secondary | ICD-10-CM | POA: Diagnosis not present

## 2023-10-14 DIAGNOSIS — R600 Localized edema: Secondary | ICD-10-CM | POA: Diagnosis not present

## 2023-10-14 NOTE — Therapy (Signed)
 OUTPATIENT PHYSICAL THERAPY LOWER EXTREMITY TREATMENT    Patient Name: JUNG YURCHAK MRN: [id number] DOB:[birth year], 73 y.o., male Today's Date: 10/14/2023  END OF SESSION:  PT End of Session - 10/14/23 1006     Visit Number 12    Number of Visits 17    Date for PT Re-Evaluation 10/31/23    Authorization Type MCR/[employer]    Authorization Time Period 09/05/23 to 10/31/23    Progress Note Due on Visit 18    PT Start Time 0930    PT Stop Time 1010    PT Time Calculation (min) 40 min    Activity Tolerance Patient tolerated treatment well;No increased pain    Behavior During Therapy WFL for tasks assessed/performed               Past Medical History:  Diagnosis Date   Allergy    Cancer (HCC)    skin cancer to upper chest/basal cell 2007   Cardiomyopathy [facility])    Chronic neck pain    DJD   Chronic pain in right foot    Complication of anesthesia    slow to wake up after quadraceps surgery at [facility].   DDD (degenerative disc disease), lumbar    Depression    Dyslipidemia    Erectile dysfunction    Exogenous obesity    GERD (gastroesophageal reflux disease)    H/O acquired cardiomyopathy 1996   RESOLVED (apparently was thought to have had an MI, but did not have any intervention).  He developed cardiomyopathy that forced him to retire from [employer]..  The current EF 60-65%.   Heart murmur    History of fracture due to fall 1988   Bilateral wrist fractures   Hyperlipidemia    Hypertension    Hypogonadism male    IBS (irritable bowel syndrome)    Obstructive sleep apnea    cpap since 1999   Pre-diabetes    on Metformin DAily with supper   Past Surgical History:  Procedure Laterality Date   CARDIAC CATHETERIZATION  2000   Nonobstructive disease.  Persistently reduced EF   CARPAL TUNNEL RELEASE Left    CARPAL TUNNEL RELEASE Right 01/05/2019   Procedure: RIGHT CARPAL TUNNEL RELEASE;  Surgeon: Betha Loa, MD;  Location: [facility];   Service: Orthopedics;  Laterality: Right;  Bier block   COLONOSCOPY     CORONARY CT ANGIOGRAM  07/2017   Coronary calcium score 0?.  Focal noncalcific plaque (30%) in proximal LAD.  Otherwise no significant CAD.  Normal aortic root (3.2 cm)   HAMMER TOE SURGERY Bilateral    ingrown toenail surgery Bilateral    2017   IR RADIOLOGY PERIPHERAL GUIDED IV START  07/25/2017   IR US GUIDE VASC ACCESS RIGHT  07/25/2017   MENISCUS REPAIR Right    2016   ORIF WRIST FRACTURE Bilateral    PLANTAR FASCIA RELEASE Right    POLYPECTOMY     SHOULDER ARTHROSCOPY Left    TENDON REPAIR Right    right quad tendon repair   Tilt Table Test     Noted to have vasovagal syncope   TOTAL HIP ARTHROPLASTY Left 12/08/2020   Procedure: LEFT TOTAL HIP ARTHROPLASTY ANTERIOR APPROACH;  Surgeon: Kathryne Hitch, MD;  Location: [facility];  Service: Orthopedics;  Laterality: Left;  Needs RNFA   TOTAL HIP ARTHROPLASTY Right 03/30/2021   Procedure: RIGHT TOTAL HIP ARTHROPLASTY ANTERIOR APPROACH;  Surgeon: Kathryne Hitch, MD;  Location: [facility];  Service:  Orthopedics;  Laterality: Right;   TOTAL KNEE ARTHROPLASTY Right 08/22/2023   Procedure: RIGHT TOTAL KNEE ARTHROPLASTY;  Surgeon: Kathryne Hitch, MD;  Location: [facility];  Service: Orthopedics;  Laterality: Right;   TRANSTHORACIC ECHOCARDIOGRAM  06/21/2015   Normal LV function.  EF 57%.  Mild left atrial enlargement, trace AI, trace MR, mild PI.   TRANSTHORACIC ECHOCARDIOGRAM  06/2017   EF 60-65%.  Mild diastolic dysfunction.  No significant valvular abnormality.  Mild aortic calcification.   WISDOM TOOTH EXTRACTION Bilateral    Patient Active Problem List   Diagnosis Date Noted   Status post total right knee replacement 08/22/2023   OSA on CPAP 08/10/2023   Status post total replacement of right hip 03/30/2021   Hyperlipidemia due to dietary fat intake 10/01/2019   Morbid obesity (HCC) 10/01/2019   Excessive daytime sleepiness 03/03/2018    Obstructive sleep apnea treated with bilevel positive airway pressure (BiPAP) 03/03/2018   Sleep paralysis, recurrent isolated 03/03/2018   Cataplexy 03/03/2018   Abnormal dreams 03/03/2018   Breathholding 03/03/2018   Hypersomnia, persistent 09/03/2017   Exertional dyspnea 06/20/2017   Vasovagal syncope 06/20/2017   Essential hypertension 06/20/2017   History of cardiomyopathy 06/20/2017    PCP: Jarome Matin MD   REFERRING PROVIDER: Kathryne Hitch, MD  REFERRING DIAG: Diagnosis M17.11 (ICD-10-CM) - Unilateral primary osteoarthritis, right knee  THERAPY DIAG:  Acute pain of right knee  Muscle weakness (generalized)  Stiffness of right knee, not elsewhere classified  Localized edema  Rationale for Evaluation and Treatment: Rehabilitation  ONSET DATE: TKR surgery 08/22/23  SUBJECTIVE:   SUBJECTIVE STATEMENT: Overall doing better, still with some ankle pain, his knee is not having pain but does have some stiffness still  PERTINENT HISTORY: See above   PAIN:  Are you having pain?  3/10 Pain location: R ankle and down side of R calf  Pain description: ache  Aggravating factors: not walking, sitting for too long   Relieving factors: movement/walking, ice and elevation  PRECAUTIONS: None  RED FLAGS: None   WEIGHT BEARING RESTRICTIONS: No  FALLS:  Has patient fallen in last 6 months? 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  PATIENT SURVEYS:  FOTO 38, predicted 55 in 12 visits   COGNITION: Overall cognitive status: Within functional limits for tasks assessed     SENSATION: Not tested  EDEMA:  Localized edema appropriate for post-op state  LOWER EXTREMITY ROM:  Active ROM Right eval Right 09/10/23 Rt 09/16/23 Rt 09/18/23 Rt 09/23/23 Rt 09/30/23 Rt 10/10/23  Hip flexion         Hip extension         Hip abduction         Hip adduction         Hip internal rotation         Hip external rotation  Knee flexion Seated edge of mat table 62*  Supine AROM 60*, AAROM 63*  Standing //bar: 87*  Supine AA: 102 A:102 P:105 A:105 P:110 A:105 P:113 A: 99*, P 107*; after exercises able to improve to A: 105*/P113*  Knee extension Seated LAQ 12*   Supine heel prop/quad set   8*  P: -8  A:6 P:5 A:3 A: -4*  Ankle dorsiflexion         Ankle plantarflexion         Ankle inversion         Ankle eversion          (Blank rows = not tested)  LOWER EXTREMITY MMT:  MMT Right eval Left eval Right 09/30/23  Hip flexion     Hip extension     Hip abduction     Hip adduction     Hip internal rotation     Hip external rotation     Knee flexion 4 5 5   Knee extension 4+ 5 5  Ankle dorsiflexion   4  Ankle plantarflexion   5  Ankle inversion   4  Ankle eversion   4   (Blank rows = not tested)  FUNCTIONAL TESTS:  Eval:   5xSTS 24 seconds UEs on chair  09/30/23: SLS 5 sec average of 3  GAIT: Eval:  Distance walked: in clinic distances  Assistive device utilized: Environmental consultant - 2 wheeled Level of assistance: Modified independence Comments: antalgic, favors R LE, limited R knee ROM with gait cycle                                                                                                                                TREATMENT DATE:  10/14/23 Precor bike seat  11 full rotations x10 minutes for w/u, ROM, tissue perfusion Knee flexion stretches 15x5 seconds  Forward steps 8 inch box R LE x15 Lateral step ups 8 inch X 15 Leg press DL 161# [id number] then R LE only 75# 2X15 Seated leg extension machine 10# R LE only 2X10 Seated hamstring curl machine 35# DL [id number]    98/11/91  Precor bike seat 11 full rotations x10 minutes for w/u, ROM, tissue perfusion Knee flexion stretches 12x10 seconds  Forward steps 8 inch box R LE x10- still had some ankle pain  Forward lunges onto soft side of BOSU x12 R LE   Ionto anterior R ankle at the talar joint line 4 hour patch/associated ionto education          10/07/2023 Recumbent bike Seat 11 Level 4-8 while we discussed his HEP (including home gym activities) Upper & Lower heel cords box 1 minute each Upper and Lower heel cords stretch (slight toe in) 1 x 20 seconds Discussed hamstrings stretching but did not have time to review   10/03/23 Scifit seat 11 L3 x10 minutes for w/u, ROM, functional activity tolerance   Percussion gun to R calf with towel for padding  Noted LLD with R LE being about 3/4 cm, we tried placing a heel lift to see if this helps his gait pattern and calf pain- had immediate improvement in gait pattern and no ankle pain upon getting up to walk  Education on possible benefits of ionto and how it works, benefit of heel lift and how it might affect ankle pain and gait pattern overall, encouraged him to keep up with exercises as discussed at last visit, also encouraged not walking barefoot and staying in shoes with heel lift so we can assess if it is helping    PATIENT EDUCATION:  Education details: exam findings, POC, HEP  Person educated: Patient Education method: Explanation, Demonstration, and Handouts Education comprehension: verbalized understanding, returned demonstration, and needs further education  HOME EXERCISE PROGRAM: Access Code: [email or web address] URL:  [email or web address] Date: 10/07/2023 Prepared by: Pauletta Browns  Exercises - Seated Knee Flexion Extension AAROM with Overpressure  - 3 x daily - 7 x weekly - 1 sets - 10-15 reps - 5 seconds  hold - Seated Passive Knee Extension  - 3 x daily - 7 x weekly - 1 sets - 1 reps - 3-5 minutes  hold - Seated Small Alternating Straight Leg Lifts  - 3 x daily - 7 x weekly - 1 sets - 10-15 reps - Short Arc Quad with Ankle Weight  - 3 x daily - 7 x weekly - 1 sets - 10 reps - 3 seconds  hold - Standing Gastroc Stretch  - 2 x daily - 7 x weekly - 1 sets - 4-5 reps - 20 seconds hold - Standing Soleus Stretch  - 2 x daily - 7 x weekly - 1 sets - 4-5 reps - 20 hold   ASSESSMENT:  CLINICAL IMPRESSION: His strength is doing well overall, is still limited by some ankle pain. He is on track to discharge in next few visits.    OBJECTIVE IMPAIRMENTS: Abnormal gait, decreased activity tolerance, decreased balance, decreased knowledge of use of DME, decreased mobility, difficulty walking, decreased ROM, decreased strength, hypomobility, increased edema, increased fascial restrictions, and pain.   ACTIVITY LIMITATIONS: sitting, standing, squatting, sleeping, stairs, transfers, and locomotion level  PARTICIPATION LIMITATIONS: driving, shopping, community activity, occupation, and yard work  PERSONAL FACTORS: Age, Education, Financial risk analyst, Past/current experiences, Profession, Social background, and Time since onset of injury/illness/exacerbation are also affecting patient's functional outcome.   REHAB POTENTIAL: Good  CLINICAL DECISION MAKING: Stable/uncomplicated  EVALUATION COMPLEXITY: Low   GOALS: Goals reviewed with patient? Yes  SHORT TERM GOALS: Target date: 10/03/2023   Will be complaint with appropriate progressive HEP  Goal status: MET 09/16/23  2.  Surgical knee extension AROM to be less than 5* Goal status: On going 10/07/23  3.  Surgical knee flexion AROM to be at least  90* Goal status: Met 09/17/23  4.  Will be independent with edema management techniques  Goal status: On going 10/07/23    LONG TERM GOALS: Target date: 10/31/2023    Surgical knee flexion AROM to be at least 105* Goal status: MET 09/30/23  2.  MMT to have improved by one grade in all weak groups  Goal status: MET 09/30/23  3.  Will be able to ambulate community distances without device and no increased pain to improve QOL  Baseline:  Goal status: MET for knee but is having limitations with ankle pain recently 09/30/23  4.  Will be able to ascend/descend flight of steps with rail with reciprocal pattern/no increased pain  Goal status:  partially MET 09/30/23, can do this ascending but still does step to pattern to descend  5.  Pain in surgical knee to be no more than 5/10 at worst  Goal status: MET 09/30/23  6.  FOTO score to be within 5 points of predicted value by time of DC  Baseline:  Goal status: On going 09/30/23   PLAN:  PT FREQUENCY: 2x/week  PT DURATION: 8 weeks  PLANNED INTERVENTIONS: 97164- PT Re-evaluation, 97110-Therapeutic exercises, 97530- Therapeutic activity, 97112- Neuromuscular re-education, 97535- Self Care, 16109- Manual therapy, [phone]- Gait training, [phone]- Orthotic Fit/training, [phone]- Aquatic Therapy, 97014- Electrical stimulation (unattended), [id number]- Ultrasound, [id number]- Ionotophoresis 4mg /ml Dexamethasone, Taping, Dry Needling, Cryotherapy, and Moist heat  PLAN FOR NEXT SESSION: progress strength and ROM as appropriate, discuss POC- does he still want to continue into first week of march or does he prefer to DC at end of February?   Ivery Quale, PT, DPT 10/14/23 10:11 AM

## 2023-10-17 ENCOUNTER — Encounter: Payer: Medicare Other | Admitting: Physical Therapy

## 2023-10-21 ENCOUNTER — Ambulatory Visit (INDEPENDENT_AMBULATORY_CARE_PROVIDER_SITE_OTHER): Payer: Medicare Other | Admitting: Physical Therapy

## 2023-10-21 ENCOUNTER — Encounter: Payer: Self-pay | Admitting: Physical Therapy

## 2023-10-21 DIAGNOSIS — M25661 Stiffness of right knee, not elsewhere classified: Secondary | ICD-10-CM | POA: Diagnosis not present

## 2023-10-21 DIAGNOSIS — M25561 Pain in right knee: Secondary | ICD-10-CM | POA: Diagnosis not present

## 2023-10-21 DIAGNOSIS — M6281 Muscle weakness (generalized): Secondary | ICD-10-CM

## 2023-10-21 DIAGNOSIS — R262 Difficulty in walking, not elsewhere classified: Secondary | ICD-10-CM | POA: Diagnosis not present

## 2023-10-21 DIAGNOSIS — R600 Localized edema: Secondary | ICD-10-CM | POA: Diagnosis not present

## 2023-10-21 NOTE — Therapy (Signed)
 OUTPATIENT PHYSICAL THERAPY LOWER EXTREMITY TREATMENT    Patient Name: CALI CUARTAS MRN: [id number] DOB:[birth year], 73 y.o., male Today's Date: 10/21/2023  END OF SESSION:  PT End of Session - 10/21/23 1352     Visit Number 13    Number of Visits 17    Date for PT Re-Evaluation 10/31/23    Authorization Type MCR/Tricare    Authorization Time Period 09/05/23 to 10/31/23    Progress Note Due on Visit 18    PT Start Time 1345    PT Stop Time 1425    PT Time Calculation (min) 40 min    Activity Tolerance Patient tolerated treatment well;No increased pain    Behavior During Therapy WFL for tasks assessed/performed               Past Medical History:  Diagnosis Date   Allergy    Cancer (HCC)    skin cancer to upper chest/basal cell 2007   Cardiomyopathy [facility]    Chronic neck pain    DJD   Chronic pain in right foot    Complication of anesthesia    slow to wake up after quadraceps surgery at [facility].   DDD (degenerative disc disease), lumbar    Depression    Dyslipidemia    Erectile dysfunction    Exogenous obesity    GERD (gastroesophageal reflux disease)    H/O acquired cardiomyopathy 1996   RESOLVED (apparently was thought to have had an MI, but did not have any intervention).  He developed cardiomyopathy that forced him to retire from [employer]..  The current EF 60-65%.   Heart murmur    History of fracture due to fall 1988   Bilateral wrist fractures   Hyperlipidemia    Hypertension    Hypogonadism male    IBS (irritable bowel syndrome)    Obstructive sleep apnea    cpap since 1999   Pre-diabetes    on Metformin DAily with supper   Past Surgical History:  Procedure Laterality Date   CARDIAC CATHETERIZATION  2000   Nonobstructive disease.  Persistently reduced EF   CARPAL TUNNEL RELEASE Left    CARPAL TUNNEL RELEASE Right 01/05/2019   Procedure: RIGHT CARPAL TUNNEL RELEASE;  Surgeon: Betha Loa, MD;  Location: [facility];   Service: Orthopedics;  Laterality: Right;  Bier block   COLONOSCOPY     CORONARY CT ANGIOGRAM  07/2017   Coronary calcium score 0?.  Focal noncalcific plaque (30%) in proximal LAD.  Otherwise no significant CAD.  Normal aortic root (3.2 cm)   HAMMER TOE SURGERY Bilateral    ingrown toenail surgery Bilateral    2017   IR RADIOLOGY PERIPHERAL GUIDED IV START  07/25/2017   IR US GUIDE VASC ACCESS RIGHT  07/25/2017   MENISCUS REPAIR Right    2016   ORIF WRIST FRACTURE Bilateral    PLANTAR FASCIA RELEASE Right    POLYPECTOMY     SHOULDER ARTHROSCOPY Left    TENDON REPAIR Right    right quad tendon repair   Tilt Table Test     Noted to have vasovagal syncope   TOTAL HIP ARTHROPLASTY Left 12/08/2020   Procedure: LEFT TOTAL HIP ARTHROPLASTY ANTERIOR APPROACH;  Surgeon: Kathryne Hitch, MD;  Location: [facility];  Service: Orthopedics;  Laterality: Left;  Needs RNFA   TOTAL HIP ARTHROPLASTY Right 03/30/2021   Procedure: RIGHT TOTAL HIP ARTHROPLASTY ANTERIOR APPROACH;  Surgeon: Kathryne Hitch, MD;  Location: [facility];  Service:  Orthopedics;  Laterality: Right;   TOTAL KNEE ARTHROPLASTY Right 08/22/2023   Procedure: RIGHT TOTAL KNEE ARTHROPLASTY;  Surgeon: Kathryne Hitch, MD;  Location: [facility];  Service: Orthopedics;  Laterality: Right;   TRANSTHORACIC ECHOCARDIOGRAM  06/21/2015   Normal LV function.  EF 57%.  Mild left atrial enlargement, trace AI, trace MR, mild PI.   TRANSTHORACIC ECHOCARDIOGRAM  06/2017   EF 60-65%.  Mild diastolic dysfunction.  No significant valvular abnormality.  Mild aortic calcification.   WISDOM TOOTH EXTRACTION Bilateral    Patient Active Problem List   Diagnosis Date Noted   Status post total right knee replacement 08/22/2023   OSA on CPAP 08/10/2023   Status post total replacement of right hip 03/30/2021   Hyperlipidemia due to dietary fat intake 10/01/2019   Morbid obesity (HCC) 10/01/2019   Excessive daytime sleepiness 03/03/2018    Obstructive sleep apnea treated with bilevel positive airway pressure (BiPAP) 03/03/2018   Sleep paralysis, recurrent isolated 03/03/2018   Cataplexy 03/03/2018   Abnormal dreams 03/03/2018   Breathholding 03/03/2018   Hypersomnia, persistent 09/03/2017   Exertional dyspnea 06/20/2017   Vasovagal syncope 06/20/2017   Essential hypertension 06/20/2017   History of cardiomyopathy 06/20/2017    PCP: Jarome Matin MD   REFERRING PROVIDER: Kathryne Hitch, MD  REFERRING DIAG: Diagnosis M17.11 (ICD-10-CM) - Unilateral primary osteoarthritis, right knee  THERAPY DIAG:  Acute pain of right knee  Muscle weakness (generalized)  Stiffness of right knee, not elsewhere classified  Localized edema  Difficulty in walking, not elsewhere classified  Rationale for Evaluation and Treatment: Rehabilitation  ONSET DATE: TKR surgery 08/22/23  SUBJECTIVE:   SUBJECTIVE STATEMENT: Overall doing better, still with some ankle pain, his knee is not having pain but does have some stiffness still  PERTINENT HISTORY: See above   PAIN:  Are you having pain?  3/10 Pain location: R ankle and down side of R calf  Pain description: ache  Aggravating factors: not walking, sitting for too long   Relieving factors: movement/walking, ice and elevation  PRECAUTIONS: None  RED FLAGS: None   WEIGHT BEARING RESTRICTIONS: No  FALLS:  Has patient fallen in last 6 months? 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  PATIENT SURVEYS:  FOTO 38, predicted 55 in 12 visits   COGNITION: Overall cognitive status: Within functional limits for tasks assessed     SENSATION: Not tested  EDEMA:  Localized edema appropriate for post-op state  LOWER EXTREMITY ROM:  Active ROM Right eval Right 09/10/23 Rt 09/16/23 Rt 09/18/23 Rt 09/23/23 Rt 09/30/23 Rt 10/10/23  Hip flexion         Hip extension         Hip abduction         Hip adduction         Hip internal rotation  Hip external rotation         Knee flexion Seated edge of mat table 62*  Supine AROM 60*, AAROM 63*  Standing //bar: 87*  Supine AA: 102 A:102 P:105 A:105 P:110 A:105 P:113 A: 99*, P 107*; after exercises able to improve to A: 105*/P113*  Knee extension Seated LAQ 12*   Supine heel prop/quad set   8*  P: -8  A:6 P:5 A:3 A: -4*  Ankle dorsiflexion         Ankle plantarflexion         Ankle inversion         Ankle eversion          (Blank rows = not tested)  LOWER EXTREMITY MMT:  MMT Right eval Left eval Right 09/30/23  Hip flexion     Hip extension     Hip abduction     Hip adduction     Hip internal rotation     Hip external rotation     Knee flexion 4 5 5   Knee extension 4+ 5 5  Ankle dorsiflexion   4  Ankle plantarflexion   5  Ankle inversion   4  Ankle eversion   4   (Blank rows = not tested)  FUNCTIONAL TESTS:  Eval:   5xSTS 24 seconds UEs on chair  09/30/23: SLS 5 sec average of 3  GAIT: Eval:  Distance walked: in clinic distances  Assistive device utilized: Environmental consultant - 2 wheeled Level of assistance: Modified independence Comments: antalgic, favors R LE, limited R knee ROM with gait cycle                                                                                                                                 TREATMENT DATE:  10/21/23 Precor bike seat 11 full rotations x10 minutes for w/u, ROM, tissue perfusion Knee flexion lunge stretches 15x5 seconds  Forward steps 8 inch box R LE x15 Lateral step ups 8 inch X 15 Leg press DL 696# 2X52 then R LE only 75# 2X15 Seated leg extension machine 10# R LE only 2X10, DL 84# 1L24 Seated hamstring curl machine 35# DL 4W10     PATIENT EDUCATION:  Education details: exam findings, POC, HEP  Person educated: Patient Education method: Explanation, Demonstration, and Handouts Education comprehension: verbalized understanding, returned demonstration, and needs further education  HOME EXERCISE PROGRAM: Access Code: [email or web address] URL: [email or web address] Date: 10/07/2023 Prepared by: Pauletta Browns  Exercises - Seated Knee Flexion Extension AAROM with Overpressure  - 3 x daily - 7 x weekly - 1 sets - 10-15 reps - 5 seconds  hold - Seated Passive Knee Extension  - 3 x daily - 7 x weekly - 1 sets - 1 reps - 3-5 minutes  hold - Seated Small Alternating Straight Leg Lifts  - 3 x daily - 7 x weekly - 1 sets - 10-15 reps - Short Arc Quad with Ankle Weight  -  3 x daily - 7 x weekly - 1 sets - 10 reps - 3 seconds  hold - Standing Gastroc Stretch  - 2 x daily - 7 x weekly - 1 sets - 4-5 reps - 20 seconds hold - Standing Soleus Stretch  - 2 x daily - 7 x weekly - 1 sets - 4-5 reps - 20 hold   ASSESSMENT:  CLINICAL IMPRESSION: He continues to do well with his knee, has one more visit left so will plan for discharge after that.    OBJECTIVE IMPAIRMENTS: Abnormal gait, decreased activity tolerance, decreased balance, decreased knowledge of use of DME, decreased mobility, difficulty walking, decreased ROM, decreased strength, hypomobility, increased edema, increased fascial restrictions, and pain.   ACTIVITY LIMITATIONS: sitting, standing, squatting, sleeping, stairs, transfers, and locomotion level  PARTICIPATION LIMITATIONS: driving, shopping,  community activity, occupation, and yard work  PERSONAL FACTORS: Age, Education, Financial risk analyst, Past/current experiences, Profession, Social background, and Time since onset of injury/illness/exacerbation are also affecting patient's functional outcome.   REHAB POTENTIAL: Good  CLINICAL DECISION MAKING: Stable/uncomplicated  EVALUATION COMPLEXITY: Low   GOALS: Goals reviewed with patient? Yes  SHORT TERM GOALS: Target date: 10/03/2023   Will be complaint with appropriate progressive HEP  Goal status: MET 09/16/23  2.  Surgical knee extension AROM to be less than 5* Goal status: On going 10/07/23  3.  Surgical knee flexion AROM to be at least 90* Goal status: Met 09/17/23  4.  Will be independent with edema management techniques  Goal status: On going 10/07/23    LONG TERM GOALS: Target date: 10/31/2023    Surgical knee flexion AROM to be at least 105* Goal status: MET 09/30/23  2.  MMT to have improved by one grade in all weak groups  Goal status: MET 09/30/23  3.  Will be able to ambulate community distances without device and no increased pain to improve QOL  Baseline:  Goal status: MET for knee but is having limitations with ankle pain recently 09/30/23  4.  Will be able to ascend/descend flight of steps with rail with reciprocal pattern/no increased pain  Goal status: partially MET 09/30/23, can do this ascending but still does step to pattern to descend  5.  Pain in surgical knee to be no more than 5/10 at worst  Goal status: MET 09/30/23  6.  FOTO score to be within 5 points of predicted value by time of DC  Baseline:  Goal status: On going 09/30/23   PLAN:  PT FREQUENCY: 2x/week  PT DURATION: 8 weeks  PLANNED INTERVENTIONS: 97164- PT Re-evaluation, 97110-Therapeutic exercises, 97530- Therapeutic activity, 97112- Neuromuscular re-education, 97535- Self Care, 78295- Manual therapy, [id number]- Gait training, [phone]- Orthotic Fit/training, [id number]- Aquatic Therapy, 97014-  Electrical stimulation (unattended), [id number]- Ultrasound, 86578- Ionotophoresis 4mg /ml Dexamethasone, Taping, Dry Needling, Cryotherapy, and Moist heat  PLAN FOR NEXT SESSION: check goals and prepare for discharge. Ivery Quale, PT, DPT 10/21/23 1:56 PM

## 2023-10-23 ENCOUNTER — Encounter: Payer: Self-pay | Admitting: Physical Therapy

## 2023-10-23 ENCOUNTER — Ambulatory Visit: Payer: Medicare Other | Admitting: Physical Therapy

## 2023-10-23 DIAGNOSIS — M25661 Stiffness of right knee, not elsewhere classified: Secondary | ICD-10-CM | POA: Diagnosis not present

## 2023-10-23 DIAGNOSIS — M25561 Pain in right knee: Secondary | ICD-10-CM

## 2023-10-23 DIAGNOSIS — M6281 Muscle weakness (generalized): Secondary | ICD-10-CM

## 2023-10-23 DIAGNOSIS — R600 Localized edema: Secondary | ICD-10-CM | POA: Diagnosis not present

## 2023-10-23 NOTE — Therapy (Signed)
 OUTPATIENT PHYSICAL THERAPY LOWER EXTREMITY TREATMENT/ DISCHARGE PHYSICAL THERAPY DISCHARGE SUMMARY  Visits from Start of Care: 14  Current functional level related to goals / functional outcomes: See below   Remaining deficits: See below   Education / Equipment: HEP  Plan:  Patient goals were met. Patient is being discharged due to meeting PT goals       Patient Name: Alexander [name] MRN: [id number] DOB:[birth year], 73 y.o., male Today's Date: 10/23/2023  END OF SESSION:  PT End of Session - 10/23/23 0921     Visit Number 14    Number of Visits 17    Date for PT Re-Evaluation 10/31/23    Authorization Type MCR/Tricare    Authorization Time Period 09/05/23 to 10/31/23    Progress Note Due on Visit 18    PT Start Time 0849    PT Stop Time 0927    PT Time Calculation (min) 38 min    Activity Tolerance Patient tolerated treatment well    Behavior During Therapy [facility] for tasks assessed/performed               Past Medical History:  Diagnosis Date   Allergy    Cancer (HCC)    skin cancer to upper chest/basal cell 2007   Cardiomyopathy [facility])    Chronic neck pain    DJD   Chronic pain in right foot    Complication of anesthesia    slow to wake up after quadraceps surgery at [facility].   DDD (degenerative disc disease), lumbar    Depression    Dyslipidemia    Erectile dysfunction    Exogenous obesity    GERD (gastroesophageal reflux disease)    H/O acquired cardiomyopathy 1996   RESOLVED (apparently was thought to have had an MI, but did not have any intervention).  He developed cardiomyopathy that forced him to retire from [employer]..  The current EF 60-65%.   Heart murmur    History of fracture due to fall 1988   Bilateral wrist fractures   Hyperlipidemia    Hypertension    Hypogonadism male    IBS (irritable bowel syndrome)    Obstructive sleep apnea    cpap since 1999   Pre-diabetes    on Metformin DAily with supper   Past Surgical  History:  Procedure Laterality Date   CARDIAC CATHETERIZATION  2000   Nonobstructive disease.  Persistently reduced EF   CARPAL TUNNEL RELEASE Left    CARPAL TUNNEL RELEASE Right 01/05/2019   Procedure: RIGHT CARPAL TUNNEL RELEASE;  Surgeon: Betha Loa, MD;  Location: [facility];  Service: Orthopedics;  Laterality: Right;  Bier block   COLONOSCOPY     CORONARY CT ANGIOGRAM  07/2017   Coronary calcium score 0?.  Focal noncalcific plaque (30%) in proximal LAD.  Otherwise no significant CAD.  Normal aortic root (3.2 cm)   HAMMER TOE SURGERY Bilateral    ingrown toenail surgery Bilateral    2017   IR RADIOLOGY PERIPHERAL GUIDED IV START  07/25/2017   IR US GUIDE VASC ACCESS RIGHT  07/25/2017   MENISCUS REPAIR Right    2016   ORIF WRIST FRACTURE Bilateral    PLANTAR FASCIA RELEASE Right    POLYPECTOMY     SHOULDER ARTHROSCOPY Left    TENDON REPAIR Right    right quad tendon repair   Tilt Table Test     Noted to have vasovagal syncope   TOTAL HIP ARTHROPLASTY Left 12/08/2020   Procedure:  LEFT TOTAL HIP ARTHROPLASTY ANTERIOR APPROACH;  Surgeon: Kathryne Hitch, MD;  Location: [facility];  Service: Orthopedics;  Laterality: Left;  Needs RNFA   TOTAL HIP ARTHROPLASTY Right 03/30/2021   Procedure: RIGHT TOTAL HIP ARTHROPLASTY ANTERIOR APPROACH;  Surgeon: Kathryne Hitch, MD;  Location: [facility];  Service: Orthopedics;  Laterality: Right;   TOTAL KNEE ARTHROPLASTY Right 08/22/2023   Procedure: RIGHT TOTAL KNEE ARTHROPLASTY;  Surgeon: Kathryne Hitch, MD;  Location: [facility];  Service: Orthopedics;  Laterality: Right;   TRANSTHORACIC ECHOCARDIOGRAM  06/21/2015   Normal LV function.  EF 57%.  Mild left atrial enlargement, trace AI, trace MR, mild PI.   TRANSTHORACIC ECHOCARDIOGRAM  06/2017   EF 60-65%.  Mild diastolic dysfunction.  No significant valvular abnormality.  Mild aortic calcification.   WISDOM TOOTH EXTRACTION Bilateral    Patient Active Problem List    Diagnosis Date Noted   Status post total right knee replacement 08/22/2023   OSA on CPAP 08/10/2023   Status post total replacement of right hip 03/30/2021   Hyperlipidemia due to dietary fat intake 10/01/2019   Morbid obesity (HCC) 10/01/2019   Excessive daytime sleepiness 03/03/2018   Obstructive sleep apnea treated with bilevel positive airway pressure (BiPAP) 03/03/2018   Sleep paralysis, recurrent isolated 03/03/2018   Cataplexy 03/03/2018   Abnormal dreams 03/03/2018   Breathholding 03/03/2018   Hypersomnia, persistent 09/03/2017   Exertional dyspnea 06/20/2017   Vasovagal syncope 06/20/2017   Essential hypertension 06/20/2017   History of cardiomyopathy 06/20/2017    PCP: Jarome Matin MD   REFERRING PROVIDER: Kathryne Hitch, MD  REFERRING DIAG: Diagnosis M17.11 (ICD-10-CM) - Unilateral primary osteoarthritis, right knee  THERAPY DIAG:  Acute pain of right knee  Muscle weakness (generalized)  Stiffness of right knee, not elsewhere classified  Localized edema  Rationale for Evaluation and Treatment: Rehabilitation  ONSET DATE: TKR surgery 08/22/23  SUBJECTIVE:   SUBJECTIVE STATEMENT: He feels ready to discharge today PERTINENT HISTORY: See above   PAIN:  Are you having pain? None in knee Pain location: R ankle and down side of R calf  Pain description: ache  Aggravating factors: not walking, sitting for too long   Relieving factors: movement/walking, ice and elevation  PRECAUTIONS: None  RED FLAGS: None   WEIGHT BEARING RESTRICTIONS: No  FALLS:  Has patient fallen in last 6 months? 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  PATIENT SURVEYS:  FOTO 38, predicted 55 in 12 visits   COGNITION: Overall cognitive status: Within functional limits for tasks assessed     SENSATION: Not tested  EDEMA:  Localized edema appropriate for post-op state  LOWER EXTREMITY ROM:  Active ROM Right eval Right 09/10/23 Rt 09/16/23 Rt 09/18/23 Rt 09/23/23 Rt 09/30/23 Rt 10/10/23 Rt 10/23/23  Hip flexion          Hip extension  Hip abduction          Hip adduction          Hip internal rotation          Hip external rotation          Knee flexion Seated edge of mat table 62*  Supine AROM 60*, AAROM 63*  Standing //bar: 87*  Supine AA: 102 A:102 P:105 A:105 P:110 A:105 P:113 A: 99*, P 107*; after exercises able to improve to A: 105*/P113* A:112 P:120  Knee extension Seated LAQ 12*   Supine heel prop/quad set   8*  P: -8  A:6 P:5 A:3 A: -4* A:-3 P:-2  Ankle dorsiflexion          Ankle plantarflexion          Ankle inversion          Ankle eversion           (Blank rows = not tested)  LOWER EXTREMITY MMT:  MMT Right eval Left eval Right 09/30/23 Right 10/23/23  Hip flexion      Hip extension      Hip abduction      Hip adduction      Hip internal rotation      Hip external rotation      Knee flexion 4 5 5 5   Knee extension 4+ 5 5 5  89#  Ankle dorsiflexion   4   Ankle plantarflexion   5   Ankle inversion   4   Ankle eversion   4    (Blank rows = not tested)  FUNCTIONAL TESTS:  Eval:   5xSTS 24 seconds UEs on chair  09/30/23: SLS 5 sec average of 3  GAIT: Eval:  Distance walked: in clinic distances  Assistive device utilized: Environmental consultant - 2  wheeled Level of assistance: Modified independence Comments: antalgic, favors R LE, limited R knee ROM with gait cycle                                                                                                                                TREATMENT DATE:  10/23/23 Precor bike seat 11 full rotations x8 minutes for w/u, ROM, tissue perfusion Hamstring stretching 30 sec X 3 Updated measurements and goals reviewed what exercises to focus on for last little bit of knee extension ROM Seated leg extension machine 10# R LE only 2X10, DL 40# 1U27 Seated hamstring curl machine 35# DL [id number]     PATIENT EDUCATION:  Education details: exam findings, POC, HEP  Person educated: Patient Education method: Explanation, Demonstration, and Handouts Education comprehension: verbalized understanding, returned demonstration, and needs further education  HOME EXERCISE PROGRAM: Access Code: [email or web address] URL: [email or web address] Date: 10/07/2023 Prepared by: Pauletta Browns  Exercises - Seated Knee Flexion Extension AAROM with Overpressure  - 3 x daily - 7 x weekly - 1 sets - 10-15 reps - 5 seconds  hold - Seated Passive Knee Extension  -  3 x daily - 7 x weekly - 1 sets - 1 reps - 3-5 minutes  hold - Seated Small Alternating Straight Leg Lifts  - 3 x daily - 7 x weekly - 1 sets - 10-15 reps - Short Arc Quad with Ankle Weight  - 3 x daily - 7 x weekly - 1 sets - 10 reps - 3 seconds  hold - Standing Gastroc Stretch  - 2 x daily - 7 x weekly - 1 sets - 4-5 reps - 20 seconds hold - Standing Soleus Stretch  - 2 x daily - 7 x weekly - 1 sets - 4-5 reps - 20 hold   ASSESSMENT:  CLINICAL IMPRESSION: Patient is being discharged due to meeting PT goals. He will continue with independent program.    OBJECTIVE IMPAIRMENTS: Abnormal gait, decreased activity tolerance, decreased balance, decreased knowledge of use of DME, decreased mobility, difficulty walking, decreased ROM, decreased strength,  hypomobility, increased edema, increased fascial restrictions, and pain.   ACTIVITY LIMITATIONS: sitting, standing, squatting, sleeping, stairs, transfers, and locomotion level  PARTICIPATION LIMITATIONS: driving, shopping, community activity, occupation, and yard work  PERSONAL FACTORS: Age, Education, Financial risk analyst, Past/current experiences, Profession, Social background, and Time since onset of injury/illness/exacerbation are also affecting patient's functional outcome.   REHAB POTENTIAL: Good  CLINICAL DECISION MAKING: Stable/uncomplicated  EVALUATION COMPLEXITY: Low   GOALS: Goals reviewed with patient? Yes  SHORT TERM GOALS: Target date: 10/03/2023   Will be complaint with appropriate progressive HEP  Goal status: MET 09/16/23  2.  Surgical knee extension AROM to be less than 5* Goal status: MET 10/23/23  3.  Surgical knee flexion AROM to be at least 90* Goal status: Met 09/17/23  4.  Will be independent with edema management techniques  Goal status: MET 10/23/23    LONG TERM GOALS: Target date: 10/31/2023    Surgical knee flexion AROM to be at least 105* Goal status: MET 09/30/23  2.  MMT to have improved by one grade in all weak groups  Goal status: MET 09/30/23  3.  Will be able to ambulate community distances without device and no increased pain to improve QOL  Baseline:  Goal status: MET 10/23/23  4.  Will be able to ascend/descend flight of steps with rail with reciprocal pattern/no increased pain  Goal status:  MET 10/23/23,  5.  Pain in surgical knee to be no more than 5/10 at worst  Goal status: MET 09/30/23  6.  FOTO score to be within 5 points of predicted value by time of DC  Baseline:  Goal status:MET 10/23/23   PLAN:  PT FREQUENCY: 2x/week  PT DURATION: 8 weeks  PLANNED INTERVENTIONS: 97164- PT Re-evaluation, 97110-Therapeutic exercises, 97530- Therapeutic activity, 97112- Neuromuscular re-education, 97535- Self Care, 40981- Manual therapy, [id number]- Gait  training, [phone]- Orthotic Fit/training, [id number]- Aquatic Therapy, 97014- Electrical stimulation (unattended), [id number]- Ultrasound, 82956- Ionotophoresis 4mg /ml Dexamethasone, Taping, Dry Needling, Cryotherapy, and Moist heat  PLAN FOR NEXT SESSION: DC today Ivery Quale, PT, DPT 10/23/23 9:23 AM

## 2023-12-11 ENCOUNTER — Ambulatory Visit: Admitting: Physician Assistant

## 2023-12-11 DIAGNOSIS — M7051 Other bursitis of knee, right knee: Secondary | ICD-10-CM

## 2023-12-11 MED ORDER — METHYLPREDNISOLONE ACETATE 40 MG/ML IJ SUSP
20.0000 mg | INTRAMUSCULAR | Status: AC | PRN
  Administered 2023-12-11: 20 mg via INTRAMUSCULAR

## 2023-12-11 MED ORDER — LIDOCAINE HCL 1 % IJ SOLN
0.5000 mL | INTRAMUSCULAR | Status: AC | PRN
Start: 2023-12-11 — End: 2023-12-11
  Administered 2023-12-11: .5 mL

## 2023-12-11 NOTE — Progress Notes (Signed)
   Procedure Note  Patient: Alexander [name]             Date of Birth: July 18, 1951           MRN: [id number]             Visit Date: 12/11/2023 HPI: Alexander [name] comes in today due to right knee pain.  He notes continued knee pain and swelling.  He did undergo Doppler postop from his right total knee which was performed on 08/22/2023.  Doppler dated 10/02/2023 was negative for DVT.  He notes that any activity has swelling about the knee.  He is wearing compression hose.  Pains but mostly medial aspect of the knee.  Initial standing difficult to walk.  No known injury.  He has tried Aleve as needed. Denies any numbness tingling radiating down the leg but has numbness tingling in the right foot status post history of back surgery.  Denies any back pain at this point in time.  Review of systems: See HPI otherwise negative  Physical exam: General Well-developed well-nourished male no acute distress mood affect appropriate.  Psych: Alert and oriented x 3. Vascular: Bilateral calf supple nontender. Bilateral knees: Good range of motion both knees.  No instability valgus varus stressing.  No abnormal warmth erythema or effusion of either knee.  Tenderness right knee only at the pes anserinus area.  Tight hamstrings bilaterally.  Procedures: Visit Diagnoses:  1. Pes anserinus bursitis of right knee     Trigger Point Inj  Date/Time: 12/11/2023 4:17 PM  Performed by: Bronson Canny, PA-C Authorized by: Bronson Canny, PA-C   Total # of Trigger Points:  1 Location: lower extremity   Needle Size:  25 G Approach:  Medial Medications #1:  0.5 mL lidocaine  1 %; 20 mg methylPREDNISolone  acetate 40 MG/ML  Plan: Will have him follow-up with Dr. Alvester Johnson as scheduled on May 12 for follow-up of the right knee.  Suggest the use of Voltaren gel 2 g 4 times daily over the pes anserinus area right knee.  Also discussed hamstring stretching exercises.  Questions were encouraged and answered at length.

## 2023-12-22 DIAGNOSIS — Z125 Encounter for screening for malignant neoplasm of prostate: Secondary | ICD-10-CM | POA: Diagnosis not present

## 2023-12-22 DIAGNOSIS — E291 Testicular hypofunction: Secondary | ICD-10-CM | POA: Diagnosis not present

## 2023-12-22 DIAGNOSIS — I10 Essential (primary) hypertension: Secondary | ICD-10-CM | POA: Diagnosis not present

## 2023-12-22 DIAGNOSIS — E1151 Type 2 diabetes mellitus with diabetic peripheral angiopathy without gangrene: Secondary | ICD-10-CM | POA: Diagnosis not present

## 2023-12-22 DIAGNOSIS — E1169 Type 2 diabetes mellitus with other specified complication: Secondary | ICD-10-CM | POA: Diagnosis not present

## 2023-12-22 DIAGNOSIS — E785 Hyperlipidemia, unspecified: Secondary | ICD-10-CM | POA: Diagnosis not present

## 2023-12-22 DIAGNOSIS — Z1212 Encounter for screening for malignant neoplasm of rectum: Secondary | ICD-10-CM | POA: Diagnosis not present

## 2023-12-29 ENCOUNTER — Encounter: Payer: Self-pay | Admitting: Orthopaedic Surgery

## 2023-12-29 ENCOUNTER — Ambulatory Visit (INDEPENDENT_AMBULATORY_CARE_PROVIDER_SITE_OTHER): Payer: Medicare Other | Admitting: Orthopaedic Surgery

## 2023-12-29 DIAGNOSIS — R82998 Other abnormal findings in urine: Secondary | ICD-10-CM | POA: Diagnosis not present

## 2023-12-29 DIAGNOSIS — I739 Peripheral vascular disease, unspecified: Secondary | ICD-10-CM | POA: Diagnosis not present

## 2023-12-29 DIAGNOSIS — I255 Ischemic cardiomyopathy: Secondary | ICD-10-CM | POA: Diagnosis not present

## 2023-12-29 DIAGNOSIS — G4733 Obstructive sleep apnea (adult) (pediatric): Secondary | ICD-10-CM | POA: Diagnosis not present

## 2023-12-29 DIAGNOSIS — I10 Essential (primary) hypertension: Secondary | ICD-10-CM | POA: Diagnosis not present

## 2023-12-29 DIAGNOSIS — E291 Testicular hypofunction: Secondary | ICD-10-CM | POA: Diagnosis not present

## 2023-12-29 DIAGNOSIS — Z23 Encounter for immunization: Secondary | ICD-10-CM | POA: Diagnosis not present

## 2023-12-29 DIAGNOSIS — E785 Hyperlipidemia, unspecified: Secondary | ICD-10-CM | POA: Diagnosis not present

## 2023-12-29 DIAGNOSIS — Z96651 Presence of right artificial knee joint: Secondary | ICD-10-CM

## 2023-12-29 DIAGNOSIS — Z0000 Encounter for general adult medical examination without abnormal findings: Secondary | ICD-10-CM | POA: Diagnosis not present

## 2023-12-29 DIAGNOSIS — E1151 Type 2 diabetes mellitus with diabetic peripheral angiopathy without gangrene: Secondary | ICD-10-CM | POA: Diagnosis not present

## 2023-12-29 NOTE — Progress Notes (Signed)
 The patient is now 4 months status post a right total knee replacement.  When he saw Dwyane Glad last month he did have a steroid injection over his right knee pes bursa area and he said that things helped greatly in terms of the relief from the injection he is doing well overall.  Examination of his right knee shows still some swelling to be expected.  His extension looks great and his flexion looks great and the knee feels stable.  He will continue to increase his activities as comfort allows.  Since he is doing so well the next time we need to see him is not for 6 months.  Will have a standing AP and lateral of his right knee at that visit.  If there are issues before then he knows to reach out and let us  know.

## 2024-01-20 ENCOUNTER — Other Ambulatory Visit: Payer: Self-pay | Admitting: Adult Health

## 2024-01-22 NOTE — Telephone Encounter (Signed)
 Last seen 07/15/23 and next f/u 07/27/24. Last refilled 10/26/23 #90.

## 2024-05-10 DIAGNOSIS — I739 Peripheral vascular disease, unspecified: Secondary | ICD-10-CM | POA: Diagnosis not present

## 2024-05-10 DIAGNOSIS — M1711 Unilateral primary osteoarthritis, right knee: Secondary | ICD-10-CM | POA: Diagnosis not present

## 2024-05-10 DIAGNOSIS — R600 Localized edema: Secondary | ICD-10-CM | POA: Diagnosis not present

## 2024-05-10 DIAGNOSIS — I10 Essential (primary) hypertension: Secondary | ICD-10-CM | POA: Diagnosis not present

## 2024-05-10 DIAGNOSIS — M79604 Pain in right leg: Secondary | ICD-10-CM | POA: Diagnosis not present

## 2024-05-19 ENCOUNTER — Telehealth: Payer: Self-pay | Admitting: Adult Health

## 2024-05-19 NOTE — Telephone Encounter (Signed)
 My chart Apt change

## 2024-06-13 DIAGNOSIS — Z23 Encounter for immunization: Secondary | ICD-10-CM | POA: Diagnosis not present

## 2024-06-21 ENCOUNTER — Encounter: Payer: Self-pay | Admitting: Radiology

## 2024-06-29 DIAGNOSIS — K458 Other specified abdominal hernia without obstruction or gangrene: Secondary | ICD-10-CM | POA: Diagnosis not present

## 2024-06-29 DIAGNOSIS — G4733 Obstructive sleep apnea (adult) (pediatric): Secondary | ICD-10-CM | POA: Diagnosis not present

## 2024-06-29 DIAGNOSIS — I10 Essential (primary) hypertension: Secondary | ICD-10-CM | POA: Diagnosis not present

## 2024-06-29 DIAGNOSIS — Z87891 Personal history of nicotine dependence: Secondary | ICD-10-CM | POA: Diagnosis not present

## 2024-06-29 DIAGNOSIS — I739 Peripheral vascular disease, unspecified: Secondary | ICD-10-CM | POA: Diagnosis not present

## 2024-06-29 DIAGNOSIS — R600 Localized edema: Secondary | ICD-10-CM | POA: Diagnosis not present

## 2024-06-29 DIAGNOSIS — E1151 Type 2 diabetes mellitus with diabetic peripheral angiopathy without gangrene: Secondary | ICD-10-CM | POA: Diagnosis not present

## 2024-06-29 DIAGNOSIS — M5441 Lumbago with sciatica, right side: Secondary | ICD-10-CM | POA: Diagnosis not present

## 2024-06-29 DIAGNOSIS — E785 Hyperlipidemia, unspecified: Secondary | ICD-10-CM | POA: Diagnosis not present

## 2024-06-30 ENCOUNTER — Other Ambulatory Visit (INDEPENDENT_AMBULATORY_CARE_PROVIDER_SITE_OTHER)

## 2024-06-30 ENCOUNTER — Ambulatory Visit (INDEPENDENT_AMBULATORY_CARE_PROVIDER_SITE_OTHER): Admitting: Orthopaedic Surgery

## 2024-06-30 ENCOUNTER — Encounter: Payer: Self-pay | Admitting: Orthopaedic Surgery

## 2024-06-30 DIAGNOSIS — M25552 Pain in left hip: Secondary | ICD-10-CM

## 2024-06-30 DIAGNOSIS — Z96642 Presence of left artificial hip joint: Secondary | ICD-10-CM

## 2024-06-30 DIAGNOSIS — Z96651 Presence of right artificial knee joint: Secondary | ICD-10-CM | POA: Diagnosis not present

## 2024-06-30 MED ORDER — METHYLPREDNISOLONE ACETATE 40 MG/ML IJ SUSP
40.0000 mg | INTRAMUSCULAR | Status: AC | PRN
  Administered 2024-06-30: 40 mg via INTRA_ARTICULAR

## 2024-06-30 MED ORDER — LIDOCAINE HCL 1 % IJ SOLN
3.0000 mL | INTRAMUSCULAR | Status: AC | PRN
  Administered 2024-06-30: 3 mL

## 2024-06-30 NOTE — Progress Notes (Signed)
 The patient is now 10 months status post a right total knee replacement to treat significant right knee pain and arthritis.  We have also replaced both of his hips back in 2022.  He did report a hard recent fall back in July landing hard against his left hip and has been very tender to the touch over the trochanteric areas where he points to.  He said the knee replacement is doing very well.  He is an active 73 year old gentleman.  On exam his right operative knee moves smoothly and fluidly.  There is no effusion.  He is ligamentously stable.  Both hips move smoothly and fluidly but the left hip does have pain to palpation which is quite significant over the trochanteric area of the hip.  AP pelvis and lateral of the left hip shows no acute findings.  The hip replacement self is well-seated.  There are some heterotopic ossification around the trochanteric area but overall the hip replacement on both sides appear stable.  X-rays of his right knee show well-seated press-fit total knee arthroplasty with no complicating features.  I did recommend a steroid injection over his right hip area where he is the point of maximal tenderness over the trochanteric area and he agreed to this and tolerated well.  He is a diabetic but he will watch his blood glucose closely.  I have told him to get this time but if it is not getting better he knows to reach out to us .    Procedure Note  Patient: Alexander [name]             Date of Birth: 06-05-1951           MRN: [id number]             Visit Date: 06/30/2024  Procedures: Visit Diagnoses:  1. History of total right knee replacement   2. History of left hip replacement     Large Joint Inj: L greater trochanter on 06/30/2024 1:06 PM Indications: pain and diagnostic evaluation Details: 22 G 1.5 in needle, lateral approach  Arthrogram: No  Medications: 3 mL lidocaine  1 %; 40 mg methylPREDNISolone  acetate 40 MG/ML Outcome: tolerated well, no immediate  complications Procedure, treatment alternatives, risks and benefits explained, specific risks discussed. Consent was given by the patient. Immediately prior to procedure a time out was called to verify the correct patient, procedure, equipment, support staff and site/side marked as required. Patient was prepped and draped in the usual sterile fashion.

## 2024-07-08 DIAGNOSIS — F17200 Nicotine dependence, unspecified, uncomplicated: Secondary | ICD-10-CM | POA: Diagnosis not present

## 2024-07-08 DIAGNOSIS — R911 Solitary pulmonary nodule: Secondary | ICD-10-CM | POA: Diagnosis not present

## 2024-07-19 ENCOUNTER — Other Ambulatory Visit: Payer: Self-pay | Admitting: Internal Medicine

## 2024-07-19 DIAGNOSIS — K458 Other specified abdominal hernia without obstruction or gangrene: Secondary | ICD-10-CM

## 2024-07-20 ENCOUNTER — Ambulatory Visit
Admission: RE | Admit: 2024-07-20 | Discharge: 2024-07-20 | Disposition: A | Source: Ambulatory Visit | Attending: Internal Medicine | Admitting: Internal Medicine

## 2024-07-20 ENCOUNTER — Other Ambulatory Visit

## 2024-07-20 DIAGNOSIS — N281 Cyst of kidney, acquired: Secondary | ICD-10-CM | POA: Diagnosis not present

## 2024-07-20 DIAGNOSIS — K458 Other specified abdominal hernia without obstruction or gangrene: Secondary | ICD-10-CM

## 2024-07-20 MED ORDER — IOPAMIDOL (ISOVUE-300) INJECTION 61%
100.0000 mL | Freq: Once | INTRAVENOUS | Status: AC | PRN
  Administered 2024-07-20: 100 mL via INTRAVENOUS

## 2024-07-22 DIAGNOSIS — L814 Other melanin hyperpigmentation: Secondary | ICD-10-CM | POA: Diagnosis not present

## 2024-07-22 DIAGNOSIS — Z85828 Personal history of other malignant neoplasm of skin: Secondary | ICD-10-CM | POA: Diagnosis not present

## 2024-07-22 DIAGNOSIS — L905 Scar conditions and fibrosis of skin: Secondary | ICD-10-CM | POA: Diagnosis not present

## 2024-07-22 DIAGNOSIS — L821 Other seborrheic keratosis: Secondary | ICD-10-CM | POA: Diagnosis not present

## 2024-07-22 DIAGNOSIS — L570 Actinic keratosis: Secondary | ICD-10-CM | POA: Diagnosis not present

## 2024-07-22 DIAGNOSIS — D235 Other benign neoplasm of skin of trunk: Secondary | ICD-10-CM | POA: Diagnosis not present

## 2024-07-22 DIAGNOSIS — B351 Tinea unguium: Secondary | ICD-10-CM | POA: Diagnosis not present

## 2024-07-22 DIAGNOSIS — D1801 Hemangioma of skin and subcutaneous tissue: Secondary | ICD-10-CM | POA: Diagnosis not present

## 2024-07-27 ENCOUNTER — Telehealth: Payer: Medicare Other | Admitting: Adult Health

## 2024-08-06 ENCOUNTER — Encounter: Payer: Self-pay | Admitting: Adult Health

## 2024-08-06 MED ORDER — ARMODAFINIL 150 MG PO TABS: 150.0000 mg | ORAL_TABLET | Freq: Every day | ORAL | 0 refills | Status: DC

## 2024-08-28 ENCOUNTER — Encounter: Payer: Self-pay | Admitting: Gastroenterology

## 2024-09-06 ENCOUNTER — Encounter: Payer: Self-pay | Admitting: Gastroenterology

## 2024-09-15 ENCOUNTER — Telehealth: Payer: Self-pay | Admitting: Adult Health

## 2024-09-15 NOTE — Telephone Encounter (Signed)
 Request made to r/s appointment

## 2024-09-15 NOTE — Progress Notes (Unsigned)
 Setup date: 09/28/2018

## 2024-09-16 ENCOUNTER — Ambulatory Visit (INDEPENDENT_AMBULATORY_CARE_PROVIDER_SITE_OTHER): Admitting: Adult Health

## 2024-09-16 ENCOUNTER — Encounter: Payer: Self-pay | Admitting: Adult Health

## 2024-09-16 ENCOUNTER — Encounter: Admitting: Adult Health

## 2024-09-16 VITALS — BP 157/87 | HR 70 | Ht 72.0 in | Wt 265.2 lb

## 2024-09-16 DIAGNOSIS — G4733 Obstructive sleep apnea (adult) (pediatric): Secondary | ICD-10-CM | POA: Diagnosis not present

## 2024-09-16 DIAGNOSIS — G47411 Narcolepsy with cataplexy: Secondary | ICD-10-CM | POA: Diagnosis not present

## 2024-09-16 MED ORDER — ARMODAFINIL 250 MG PO TABS: 250.0000 mg | ORAL_TABLET | Freq: Every day | ORAL | 1 refills | Status: AC

## 2024-09-16 NOTE — Progress Notes (Signed)
 "   PATIENT: Alexander [name] DOB: 1951-06-21  REASON FOR VISIT: follow up HISTORY FROM: patient PRIMARY NEUROLOGIST: Dr. Chalice   Chief Complaint  Patient presents with   RM 18    Patient is here alone for CPAP follow-up and medication refills for narcolepsy - current medication has not been working for the narcolepsy. Also would like discuss getting a new CPAP machine  ESS - 21   OTHER    Set up date: 09/28/2018 AirSense 10 AutoSet     HISTORY OF PRESENT ILLNESS: Today 09/16/24   Alexander [name] is a 74 y.o. male who has been followed in this office for obstructive sleep apnea on CPAP and narcolepsy. Returns today for follow-up.  At the last visit we switched him to Nuvigil  150 mg daily however he does not feel that has been helpful.  He still having trouble staying awake during the day.  Previously was on Provigil .  Does not wish to be on stimulant such as Adderall.  CPAP continues to work well for him his download is below    HISTORY 07/15/23:   Alexander [name] is a 74 y.o. male with a history of OSA on CPAP and narcolepsy. Returns today for follow-up.  He reports that the CPAP is working well.  He denies any new issues.  He continues to use Provigil  for narcolepsy however he states that he is finding that he is falling asleep during church and during conversations.  Reports that he does not have any trouble staying awake while driving.  He has only been on Provigil  for narcolepsy.  His download for CPAP is below        07/16/22: Alexander [name] is a 74 year old male with a history of obstructive sleep apnea on CPAP.  He returns today for follow-up.  His download is below.  He denies any new issues.  Reports that the CPAP continues to work well for him.  Continues on modafinil  200 mg daily.  Denies falling asleep while driving.  He states that if he sits still long enough without any engagement he may doze off.      07/14/21: Alexander [name] is a 74 year old male with a  history of narcolepsy and obstructive sleep apnea on CPAP.  His download is attached.  He reports that CPAP is working well for him.  He denies any new issues.  Continues on modafinil  200 mg daily.     HISTORY 07/16/20:   Alexander [name] is a 74 year old male with a history of narcolepsy and obstructive sleep apnea on CPAP.  His download indicates that he uses machine nightly for compliance of 100%.  He uses machine greater than 4 hours 28 days for compliance of 93%.  On average he uses his machine 5 hours and 53 minutes.  His residual AHI is 3.5 on 512 cm of water  with EPR of 2.  Leak in the 95th percentile is 18.2 L/min.  Reports that the CPAP is working well for him.  He denies any new issues.   He remains on modafinil  200 mg daily.  Reports that this continues to work well for him.  Reports that when he sneezes he feels a slight loss of muscle tone but does not fall to the ground.  Continues to operate a motor vehicle.  No issues falling asleep while driving.  He returns today for an evaluation.    REVIEW OF SYSTEMS: Out of a complete 14 system review of symptoms, the patient complains only of the  following symptoms, and all other reviewed systems are negative.   Listed in HPI  ALLERGIES: Allergies[1]  HOME MEDICATIONS: Outpatient Medications Prior to Visit  Medication Sig Dispense Refill   Armodafinil  150 MG tablet Take 1 tablet (150 mg total) by mouth daily. 30 tablet 0   aspirin  81 MG chewable tablet Chew 1 tablet (81 mg total) by mouth 2 (two) times daily. 35 tablet 0   Calcium Carb-Cholecalciferol (CALCIUM 600/VITAMIN D PO) Take 1 tablet by mouth daily.     carvedilol  (COREG ) 12.5 MG tablet Take 1 tablet (12.5 mg total) by mouth 2 (two) times daily with a meal. 30 tablet 0   losartan -hydrochlorothiazide  (HYZAAR) 100-25 MG tablet Take 1 tablet by mouth daily. 30 tablet 0   MAGNESIUM  PO Take 250 mg by mouth in the morning and at bedtime.     metFORMIN  (GLUCOPHAGE -XR) 500 MG 24 hr tablet  Take 500 mg by mouth daily with supper.     Multiple Vitamin (MULTIVITAMIN WITH MINERALS) TABS tablet Take 1 tablet by mouth daily.     testosterone  cypionate (DEPOTESTOSTERONE CYPIONATE) 200 MG/ML injection Inject 200 mg into the muscle See admin instructions. Every 10 days     TRULICITY 1.5 MG/0.5ML SOAJ Inject 1.5 mg into the skin once a week.     vitamin B-12 (CYANOCOBALAMIN ) 1000 MCG tablet Take 2,000 mcg by mouth daily.     doxycycline  (VIBRA -TABS) 100 MG tablet Take 1 tablet (100 mg total) by mouth 2 (two) times daily. (Patient not taking: Reported on 09/16/2024) 30 tablet 0   methocarbamol  (ROBAXIN ) 500 MG tablet Take 1 tablet (500 mg total) by mouth every 6 (six) hours as needed for muscle spasms. (Patient not taking: Reported on 09/16/2024) 40 tablet 1   oxyCODONE  (OXY IR/ROXICODONE ) 5 MG immediate release tablet Take 1-2 tablets (5-10 mg total) by mouth every 6 (six) hours as needed for moderate pain (pain score 4-6) (pain score 4-6). (Patient not taking: Reported on 09/16/2024) 30 tablet 0   No facility-administered medications prior to visit.    PAST MEDICAL HISTORY: Past Medical History:  Diagnosis Date   Allergy    Cancer (HCC)    skin cancer to upper chest/basal cell 2007   Cardiomyopathy [facility])    Chronic neck pain    DJD   Chronic pain in right foot    Complication of anesthesia    slow to wake up after quadraceps surgery at [facility].   DDD (degenerative disc disease), lumbar    Depression    Dyslipidemia    Erectile dysfunction    Exogenous obesity    GERD (gastroesophageal reflux disease)    H/O acquired cardiomyopathy 1996   RESOLVED (apparently was thought to have had an MI, but did not have any intervention).  He developed cardiomyopathy that forced him to retire from [employer]..  The current EF 60-65%.   Heart murmur    History of fracture due to fall 1988   Bilateral wrist fractures   Hyperlipidemia    Hypertension    Hypogonadism male    IBS  (irritable bowel syndrome)    Obstructive sleep apnea    cpap since 1999   Pre-diabetes    on Metformin  DAily with supper    PAST SURGICAL HISTORY: Past Surgical History:  Procedure Laterality Date   CARDIAC CATHETERIZATION  2000   Nonobstructive disease.  Persistently reduced EF   CARPAL TUNNEL RELEASE Left    CARPAL TUNNEL RELEASE Right 01/05/2019   Procedure: RIGHT CARPAL  TUNNEL RELEASE;  Surgeon: Murrell Drivers, MD;  Location: [facility];  Service: Orthopedics;  Laterality: Right;  Bier block   COLONOSCOPY     CORONARY CT ANGIOGRAM  07/2017   Coronary calcium score 0?.  Focal noncalcific plaque (30%) in proximal LAD.  Otherwise no significant CAD.  Normal aortic root (3.2 cm)   HAMMER TOE SURGERY Bilateral    ingrown toenail surgery Bilateral    2017   IR RADIOLOGY PERIPHERAL GUIDED IV START  07/25/2017   IR US  GUIDE VASC ACCESS RIGHT  07/25/2017   MENISCUS REPAIR Right    2016   ORIF WRIST FRACTURE Bilateral    PLANTAR FASCIA RELEASE Right    POLYPECTOMY     SHOULDER ARTHROSCOPY Left    TENDON REPAIR Right    right quad tendon repair   Tilt Table Test     Noted to have vasovagal syncope   TOTAL HIP ARTHROPLASTY Left 12/08/2020   Procedure: LEFT TOTAL HIP ARTHROPLASTY ANTERIOR APPROACH;  Surgeon: Vernetta Lonni GRADE, MD;  Location: [facility];  Service: Orthopedics;  Laterality: Left;  Needs RNFA   TOTAL HIP ARTHROPLASTY Right 03/30/2021   Procedure: RIGHT TOTAL HIP ARTHROPLASTY ANTERIOR APPROACH;  Surgeon: Vernetta Lonni GRADE, MD;  Location: [facility];  Service: Orthopedics;  Laterality: Right;   TOTAL KNEE ARTHROPLASTY Right 08/22/2023   Procedure: RIGHT TOTAL KNEE ARTHROPLASTY;  Surgeon: Vernetta Lonni GRADE, MD;  Location: [facility];  Service: Orthopedics;  Laterality: Right;   TRANSTHORACIC ECHOCARDIOGRAM  06/21/2015   Normal LV function.  EF 57%.  Mild left atrial enlargement, trace AI, trace MR, mild PI.   TRANSTHORACIC ECHOCARDIOGRAM  06/2017   EF  60-65%.  Mild diastolic dysfunction.  No significant valvular abnormality.  Mild aortic calcification.   WISDOM TOOTH EXTRACTION Bilateral     FAMILY HISTORY: Family History  Problem Relation Age of Onset   Cancer Mother        duodenal CA   Heart disease Mother    Depression Mother    Stomach cancer Mother    Cancer Father    Hypertension Father    High Cholesterol Father    Heart disease Father    Diabetes Father    Hypertension Sister    High Cholesterol Sister    Heart disease Sister    Esophageal cancer Neg Hx    Rectal cancer Neg Hx    Colon cancer Neg Hx    Colon polyps Neg Hx    Migraines Neg Hx    Seizures Neg Hx    Stroke Neg Hx    Sleep apnea Neg Hx     SOCIAL HISTORY: Social History   Socioeconomic History   Marital status: Married    Spouse name: Not on file   Number of children: Not on file   Years of education: Not on file   Highest education level: Not on file  Occupational History   Not on file  Tobacco Use   Smoking status: Former    Current packs/day: 0.00    Types: Cigarettes    Quit date: 1996    Years since quitting: 30.0   Smokeless tobacco: Never  Vaping Use   Vaping status: Never Used  Substance and Sexual Activity   Alcohol  use: Yes    Alcohol /week: 2.0 standard drinks of alcohol     Types: 2 Standard drinks or equivalent per week    Comment: social   Drug use: No   Sexual activity: Yes  Other Topics Concern  Not on file  Social History Narrative   He has been married for 36 years.     Son (born 70) -lives in [place]    Daughter (born 36) also lives in [place] .   Retired from [employer]  Enjoys doing house remodeling.   Remote history of smoking, stopped in 1996.   Minimal alcohol  use.   1 cup of coffee per day and some mountain dew weekly    Social Drivers of Health   Tobacco Use: Medium Risk (06/30/2024)   Patient History    Smoking Tobacco Use: Former    Smokeless Tobacco Use: Never    Passive Exposure: Not  on Actuary Strain: Not on file  Food Insecurity: No Food Insecurity (08/23/2023)   Hunger Vital Sign    Worried About Running Out of Food in the Last Year: Never true    Ran Out of Food in the Last Year: Never true  Transportation Needs: Not on file  Physical Activity: Not on file  Stress: Not on file  Social Connections: Not on file  Intimate Partner Violence: Not on file  Depression (EYV7-0): Not on file  Alcohol  Screen: Not on file  Housing: Not on file  Utilities: Not on file  Health Literacy: Not on file      PHYSICAL EXAM  Vitals:   09/16/24 1357 09/16/24 1402  BP: (!) 157/87 (!) 157/87  Pulse: 70   Weight: 265 lb 3.2 oz (120.3 kg)   Height: 6' (1.829 m)    Body mass index is 35.97 kg/m.  Generalized: Well developed, in no acute distress   Neurological examination  Mentation: Alert oriented to time, place, history taking. Follows all commands speech and language fluent Cranial nerve II-XII: Pupils were equal round reactive to light. Extraocular movements were full, visual field were full on confrontational test. Facial sensation and strength were normal. Uvula tongue midline. Head turning and shoulder shrug  were normal and symmetric. Motor: The motor testing reveals 5 over 5 strength of all 4 extremities. Good symmetric motor tone is noted throughout.  Sensory: Sensory testing is intact to soft touch on all 4 extremities. No evidence of extinction is noted.  Coordination: Cerebellar testing reveals good finger-[name]-finger and heel-to-shin bilaterally.  Gait and station: Gait is normal. Tandem gait is normal. Romberg is negative. No drift is seen.  Reflexes: Deep tendon reflexes are symmetric and normal bilaterally.   DIAGNOSTIC DATA (LABS, IMAGING, TESTING) - I reviewed patient records, labs, notes, testing and imaging myself where available.  Lab Results  Component Value Date   WBC 19.8 (H) 08/23/2023   HGB 15.3 08/23/2023   HCT 45.8 08/23/2023    MCV 90.7 08/23/2023   PLT 176 08/23/2023      Component Value Date/Time   NA 135 08/23/2023 0545   NA 145 (H) 06/26/2017 1049   K 3.7 08/23/2023 0545   CL 103 08/23/2023 0545   CO2 25 08/23/2023 0545   GLUCOSE 221 (H) 08/23/2023 0545   BUN 16 08/23/2023 0545   BUN 16 06/26/2017 1049   CREATININE 0.96 08/23/2023 0545   CALCIUM 8.2 (L) 08/23/2023 0545   GFRNONAA >60 08/23/2023 0545   GFRAA >60 01/01/2019 1224   No results found for: CHOL, HDL, LDLCALC, LDLDIRECT, TRIG, CHOLHDL Lab Results  Component Value Date   HGBA1C 5.9 (H) 03/19/2021   No results found for: VITAMINB12 No results found for: TSH    ASSESSMENT AND PLAN 74 y.o. year old male  has a  past medical history of Allergy, Cancer (HCC), Cardiomyopathy (HCC), Chronic neck pain, Chronic pain in right foot, Complication of anesthesia, DDD (degenerative disc disease), lumbar, Depression, Dyslipidemia, Erectile dysfunction, Exogenous obesity, GERD (gastroesophageal reflux disease), H/O acquired cardiomyopathy (1996), Heart murmur, History of fracture due to fall (1988), Hyperlipidemia, Hypertension, Hypogonadism male, IBS (irritable bowel syndrome), Obstructive sleep apnea, and Pre-diabetes. here with:  Obstructive sleep apnea on CPAP  Increase armodafinil  to 250 mg at bedtime may consider Sunosi in the future if the increase in armodafinil  is not beneficial CPAP download shows good compliance Good treatment of apnea encourage patient to continue using CPAP nightly and greater than 4 hours each night Follow-up in 6 months or sooner if needed     Duwaine Russell, MSN, NP-C 09/16/2024, 2:06 PM [facility] [address] [place], [place] [address] [phone]      [1] No Known Allergies  "

## 2024-09-16 NOTE — Patient Instructions (Addendum)
 Your Plan:  Increase armodafinil  to 250 mg daily  Consider sunosi   Thank you for coming to see us  at [facility]. I hope we have been able to provide you high quality care today.  You may receive a patient satisfaction survey over the next few weeks. We would appreciate your feedback and comments so that we may continue to improve ourselves and the health of our patients.

## 2024-09-27 ENCOUNTER — Encounter

## 2024-10-15 ENCOUNTER — Encounter: Admitting: Gastroenterology

## 2024-10-20 ENCOUNTER — Encounter: Admitting: Adult Health

## 2024-11-03 ENCOUNTER — Ambulatory Visit: Admitting: Adult Health

## 2025-10-11 ENCOUNTER — Ambulatory Visit: Admitting: Adult Health
